# Patient Record
Sex: Male | Born: 1955 | Race: White | Hispanic: No | Marital: Married | Smoking: Former smoker
Health system: Southern US, Community
[De-identification: ages and names within clinical notes are randomized; demographics above are authoritative.]

## PROBLEM LIST (undated history)

## (undated) DIAGNOSIS — B182 Chronic viral hepatitis C: Secondary | ICD-10-CM

## (undated) DIAGNOSIS — E119 Type 2 diabetes mellitus without complications: Secondary | ICD-10-CM

## (undated) DIAGNOSIS — I499 Cardiac arrhythmia, unspecified: Secondary | ICD-10-CM

## (undated) DIAGNOSIS — F528 Other sexual dysfunction not due to a substance or known physiological condition: Secondary | ICD-10-CM

## (undated) DIAGNOSIS — Z9289 Personal history of other medical treatment: Secondary | ICD-10-CM

## (undated) DIAGNOSIS — N400 Enlarged prostate without lower urinary tract symptoms: Secondary | ICD-10-CM

## (undated) DIAGNOSIS — F909 Attention-deficit hyperactivity disorder, unspecified type: Secondary | ICD-10-CM

## (undated) DIAGNOSIS — R0600 Dyspnea, unspecified: Secondary | ICD-10-CM

## (undated) DIAGNOSIS — K219 Gastro-esophageal reflux disease without esophagitis: Secondary | ICD-10-CM

## (undated) DIAGNOSIS — D649 Anemia, unspecified: Secondary | ICD-10-CM

## (undated) HISTORY — DX: Gastro-esophageal reflux disease without esophagitis: K21.9

## (undated) HISTORY — PX: APPENDECTOMY: SHX54

## (undated) HISTORY — PX: KNEE SURGERY: SHX244

## (undated) HISTORY — DX: Benign prostatic hyperplasia without lower urinary tract symptoms: N40.0

## (undated) HISTORY — DX: Attention-deficit hyperactivity disorder, unspecified type: F90.9

## (undated) HISTORY — DX: Type 2 diabetes mellitus without complications: E11.9

## (undated) HISTORY — DX: Other sexual dysfunction not due to a substance or known physiological condition: F52.8

## (undated) HISTORY — PX: TONSILLECTOMY: SUR1361

## (undated) HISTORY — PX: OTHER SURGICAL HISTORY: SHX169

## (undated) HISTORY — DX: Chronic viral hepatitis C: B18.2

---
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
## 1979-02-15 HISTORY — PX: TRACHEOSTOMY CLOSURE: SHX458

## 2005-02-14 LAB — HM COLONOSCOPY: HM Colonoscopy: NORMAL

## 2005-05-10 ENCOUNTER — Ambulatory Visit: Payer: Self-pay | Admitting: Internal Medicine

## 2005-05-16 ENCOUNTER — Ambulatory Visit: Payer: Self-pay | Admitting: Internal Medicine

## 2005-06-27 ENCOUNTER — Ambulatory Visit: Payer: Self-pay | Admitting: Internal Medicine

## 2005-08-29 ENCOUNTER — Ambulatory Visit: Payer: Self-pay | Admitting: Internal Medicine

## 2005-09-16 ENCOUNTER — Ambulatory Visit: Payer: Self-pay | Admitting: Gastroenterology

## 2005-10-03 ENCOUNTER — Ambulatory Visit: Payer: Self-pay | Admitting: Gastroenterology

## 2005-11-29 ENCOUNTER — Ambulatory Visit: Payer: Self-pay | Admitting: Internal Medicine

## 2005-11-29 LAB — CONVERTED CEMR LAB
ALT: 152 units/L — ABNORMAL HIGH (ref 0–40)
AST: 68 units/L — ABNORMAL HIGH (ref 0–37)
Albumin: 3.9 g/dL (ref 3.5–5.2)
Alkaline Phosphatase: 53 units/L (ref 39–117)
Bilirubin, Direct: 0.2 mg/dL (ref 0.0–0.3)
Hgb A1c MFr Bld: 6.3 % — ABNORMAL HIGH (ref 4.6–6.0)
Total Bilirubin: 1 mg/dL (ref 0.3–1.2)
Total Protein: 7 g/dL (ref 6.0–8.3)

## 2005-12-27 ENCOUNTER — Ambulatory Visit: Payer: Self-pay | Admitting: Internal Medicine

## 2006-03-29 ENCOUNTER — Ambulatory Visit: Payer: Self-pay | Admitting: Internal Medicine

## 2006-07-19 ENCOUNTER — Encounter: Payer: Self-pay | Admitting: Internal Medicine

## 2006-07-19 DIAGNOSIS — F909 Attention-deficit hyperactivity disorder, unspecified type: Secondary | ICD-10-CM | POA: Insufficient documentation

## 2006-07-19 DIAGNOSIS — I779 Disorder of arteries and arterioles, unspecified: Secondary | ICD-10-CM | POA: Insufficient documentation

## 2006-07-19 DIAGNOSIS — E119 Type 2 diabetes mellitus without complications: Secondary | ICD-10-CM

## 2006-07-19 DIAGNOSIS — K219 Gastro-esophageal reflux disease without esophagitis: Secondary | ICD-10-CM | POA: Insufficient documentation

## 2006-07-19 DIAGNOSIS — F528 Other sexual dysfunction not due to a substance or known physiological condition: Secondary | ICD-10-CM | POA: Insufficient documentation

## 2006-07-19 DIAGNOSIS — N400 Enlarged prostate without lower urinary tract symptoms: Secondary | ICD-10-CM

## 2006-07-19 HISTORY — DX: Other sexual dysfunction not due to a substance or known physiological condition: F52.8

## 2006-07-19 HISTORY — DX: Type 2 diabetes mellitus without complications: E11.9

## 2006-07-19 HISTORY — DX: Benign prostatic hyperplasia without lower urinary tract symptoms: N40.0

## 2006-07-19 HISTORY — DX: Attention-deficit hyperactivity disorder, unspecified type: F90.9

## 2006-07-19 HISTORY — DX: Gastro-esophageal reflux disease without esophagitis: K21.9

## 2006-10-13 ENCOUNTER — Telehealth: Payer: Self-pay | Admitting: Family Medicine

## 2006-11-03 ENCOUNTER — Telehealth: Payer: Self-pay | Admitting: Internal Medicine

## 2007-03-09 ENCOUNTER — Telehealth: Payer: Self-pay | Admitting: Internal Medicine

## 2007-04-02 ENCOUNTER — Telehealth (INDEPENDENT_AMBULATORY_CARE_PROVIDER_SITE_OTHER): Payer: Self-pay | Admitting: *Deleted

## 2007-04-03 ENCOUNTER — Ambulatory Visit: Payer: Self-pay | Admitting: Internal Medicine

## 2007-04-03 DIAGNOSIS — B182 Chronic viral hepatitis C: Secondary | ICD-10-CM | POA: Insufficient documentation

## 2007-04-03 DIAGNOSIS — J069 Acute upper respiratory infection, unspecified: Secondary | ICD-10-CM | POA: Insufficient documentation

## 2007-04-03 HISTORY — DX: Chronic viral hepatitis C: B18.2

## 2007-04-03 LAB — CONVERTED CEMR LAB
ALT: 109 units/L — ABNORMAL HIGH (ref 0–53)
AST: 57 units/L — ABNORMAL HIGH (ref 0–37)
Albumin: 4.1 g/dL (ref 3.5–5.2)
Alkaline Phosphatase: 76 units/L (ref 39–117)
BUN: 9 mg/dL (ref 6–23)
Basophils Absolute: 0 10*3/uL (ref 0.0–0.1)
Basophils Relative: 0.3 % (ref 0.0–1.0)
Bilirubin, Direct: 0.3 mg/dL (ref 0.0–0.3)
CO2: 28 meq/L (ref 19–32)
Calcium: 9.8 mg/dL (ref 8.4–10.5)
Chloride: 100 meq/L (ref 96–112)
Creatinine, Ser: 0.9 mg/dL (ref 0.4–1.5)
Eosinophils Absolute: 0.1 10*3/uL (ref 0.0–0.6)
Eosinophils Relative: 0.8 % (ref 0.0–5.0)
GFR calc Af Amer: 114 mL/min
GFR calc non Af Amer: 95 mL/min
Glucose, Bld: 177 mg/dL — ABNORMAL HIGH (ref 70–99)
HCT: 49.6 % (ref 39.0–52.0)
Hemoglobin: 17.1 g/dL — ABNORMAL HIGH (ref 13.0–17.0)
Hgb A1c MFr Bld: 8 % — ABNORMAL HIGH (ref 4.6–6.0)
Lymphocytes Relative: 24.8 % (ref 12.0–46.0)
MCHC: 34.6 g/dL (ref 30.0–36.0)
MCV: 90.3 fL (ref 78.0–100.0)
Monocytes Absolute: 0.8 10*3/uL — ABNORMAL HIGH (ref 0.2–0.7)
Monocytes Relative: 8.5 % (ref 3.0–11.0)
Neutro Abs: 6.3 10*3/uL (ref 1.4–7.7)
Neutrophils Relative %: 65.6 % (ref 43.0–77.0)
PSA: 0.63 ng/mL (ref 0.10–4.00)
Platelets: 179 10*3/uL (ref 150–400)
Potassium: 4.3 meq/L (ref 3.5–5.1)
RBC: 5.49 M/uL (ref 4.22–5.81)
RDW: 11.6 % (ref 11.5–14.6)
Sodium: 137 meq/L (ref 135–145)
TSH: 1.32 microintl units/mL (ref 0.35–5.50)
Total Bilirubin: 1.1 mg/dL (ref 0.3–1.2)
Total Protein: 7 g/dL (ref 6.0–8.3)
WBC: 9.6 10*3/uL (ref 4.5–10.5)

## 2007-04-17 ENCOUNTER — Telehealth: Payer: Self-pay | Admitting: Internal Medicine

## 2007-05-10 ENCOUNTER — Telehealth (INDEPENDENT_AMBULATORY_CARE_PROVIDER_SITE_OTHER): Payer: Self-pay | Admitting: *Deleted

## 2007-05-30 ENCOUNTER — Telehealth: Payer: Self-pay | Admitting: Internal Medicine

## 2007-05-31 ENCOUNTER — Telehealth (INDEPENDENT_AMBULATORY_CARE_PROVIDER_SITE_OTHER): Payer: Self-pay | Admitting: *Deleted

## 2007-05-31 ENCOUNTER — Encounter: Payer: Self-pay | Admitting: Internal Medicine

## 2007-07-03 ENCOUNTER — Encounter: Payer: Self-pay | Admitting: Internal Medicine

## 2007-07-05 ENCOUNTER — Encounter: Admission: RE | Admit: 2007-07-05 | Discharge: 2007-07-05 | Payer: Self-pay | Admitting: Gastroenterology

## 2007-07-10 ENCOUNTER — Telehealth: Payer: Self-pay | Admitting: Internal Medicine

## 2007-07-20 ENCOUNTER — Encounter: Payer: Self-pay | Admitting: Internal Medicine

## 2007-07-20 ENCOUNTER — Telehealth: Payer: Self-pay | Admitting: Internal Medicine

## 2007-08-06 ENCOUNTER — Encounter: Payer: Self-pay | Admitting: Internal Medicine

## 2007-08-14 ENCOUNTER — Encounter (INDEPENDENT_AMBULATORY_CARE_PROVIDER_SITE_OTHER): Payer: Self-pay | Admitting: Diagnostic Radiology

## 2007-08-14 ENCOUNTER — Ambulatory Visit (HOSPITAL_COMMUNITY): Admission: RE | Admit: 2007-08-14 | Discharge: 2007-08-14 | Payer: Self-pay | Admitting: Gastroenterology

## 2007-09-07 ENCOUNTER — Encounter: Payer: Self-pay | Admitting: Internal Medicine

## 2007-10-12 ENCOUNTER — Telehealth: Payer: Self-pay | Admitting: Internal Medicine

## 2007-12-14 ENCOUNTER — Telehealth: Payer: Self-pay | Admitting: Internal Medicine

## 2007-12-19 ENCOUNTER — Encounter: Payer: Self-pay | Admitting: Internal Medicine

## 2007-12-19 ENCOUNTER — Telehealth: Payer: Self-pay | Admitting: Internal Medicine

## 2008-05-02 ENCOUNTER — Telehealth: Payer: Self-pay | Admitting: Internal Medicine

## 2008-06-12 ENCOUNTER — Telehealth: Payer: Self-pay | Admitting: Internal Medicine

## 2008-10-06 ENCOUNTER — Telehealth: Payer: Self-pay | Admitting: Internal Medicine

## 2008-10-13 ENCOUNTER — Ambulatory Visit: Payer: Self-pay | Admitting: Internal Medicine

## 2008-10-13 LAB — CONVERTED CEMR LAB
ALT: 172 units/L — ABNORMAL HIGH (ref 0–53)
AST: 83 units/L — ABNORMAL HIGH (ref 0–37)
Albumin: 4 g/dL (ref 3.5–5.2)
Alkaline Phosphatase: 54 units/L (ref 39–117)
BUN: 18 mg/dL (ref 6–23)
Basophils Absolute: 0 10*3/uL (ref 0.0–0.1)
Basophils Relative: 0.1 % (ref 0.0–3.0)
Bilirubin, Direct: 0.2 mg/dL (ref 0.0–0.3)
CO2: 26 meq/L (ref 19–32)
Calcium: 9.4 mg/dL (ref 8.4–10.5)
Chloride: 105 meq/L (ref 96–112)
Cholesterol: 157 mg/dL (ref 0–200)
Creatinine, Ser: 1.1 mg/dL (ref 0.4–1.5)
Creatinine,U: 403.2 mg/dL
Eosinophils Absolute: 0.1 10*3/uL (ref 0.0–0.7)
Eosinophils Relative: 1.2 % (ref 0.0–5.0)
GFR calc non Af Amer: 74.28 mL/min (ref >60)
Glucose, Bld: 264 mg/dL — ABNORMAL HIGH (ref 70–99)
HCT: 45 % (ref 39.0–52.0)
HDL: 32.1 mg/dL — ABNORMAL LOW (ref >39.00)
Hemoglobin: 16.1 g/dL (ref 13.0–17.0)
LDL Cholesterol: 98 mg/dL (ref 0–99)
Lymphocytes Relative: 29.3 % (ref 12.0–46.0)
Lymphs Abs: 1.6 10*3/uL (ref 0.7–4.0)
MCHC: 35.7 g/dL (ref 30.0–36.0)
MCV: 89.2 fL (ref 78.0–100.0)
Microalb Creat Ratio: 16.6 mg/g (ref 0.0–30.0)
Microalb, Ur: 6.7 mg/dL — ABNORMAL HIGH (ref 0.0–1.9)
Monocytes Absolute: 0.6 10*3/uL (ref 0.1–1.0)
Monocytes Relative: 9.9 % (ref 3.0–12.0)
Neutro Abs: 3.3 10*3/uL (ref 1.4–7.7)
Neutrophils Relative %: 59.5 % (ref 43.0–77.0)
PSA: 0.61 ng/mL (ref 0.10–4.00)
Platelets: 136 10*3/uL — ABNORMAL LOW (ref 150.0–400.0)
Potassium: 4 meq/L (ref 3.5–5.1)
RBC: 5.04 M/uL (ref 4.22–5.81)
RDW: 11.8 % (ref 11.5–14.6)
Sodium: 138 meq/L (ref 135–145)
TSH: 2 microintl units/mL (ref 0.35–5.50)
Total Bilirubin: 1.4 mg/dL — ABNORMAL HIGH (ref 0.3–1.2)
Total CHOL/HDL Ratio: 5
Total Protein: 7.1 g/dL (ref 6.0–8.3)
Triglycerides: 136 mg/dL (ref 0.0–149.0)
VLDL: 27.2 mg/dL (ref 0.0–40.0)
WBC: 5.6 10*3/uL (ref 4.5–10.5)

## 2008-10-23 ENCOUNTER — Ambulatory Visit: Payer: Self-pay | Admitting: Internal Medicine

## 2008-10-23 LAB — CONVERTED CEMR LAB: Hgb A1c MFr Bld: 9.1 % — ABNORMAL HIGH (ref 4.6–6.5)

## 2008-10-23 LAB — HM DIABETES EYE EXAM: HM Diabetic Eye Exam: NORMAL

## 2008-10-30 ENCOUNTER — Ambulatory Visit: Payer: Self-pay | Admitting: Endocrinology

## 2008-11-06 ENCOUNTER — Telehealth: Payer: Self-pay | Admitting: Internal Medicine

## 2008-11-12 ENCOUNTER — Encounter: Payer: Self-pay | Admitting: Internal Medicine

## 2008-11-24 ENCOUNTER — Ambulatory Visit: Payer: Self-pay | Admitting: Endocrinology

## 2009-01-02 ENCOUNTER — Encounter (INDEPENDENT_AMBULATORY_CARE_PROVIDER_SITE_OTHER): Payer: Self-pay | Admitting: *Deleted

## 2009-01-23 ENCOUNTER — Ambulatory Visit: Payer: Self-pay | Admitting: Endocrinology

## 2009-01-23 LAB — CONVERTED CEMR LAB: Hgb A1c MFr Bld: 8 % — ABNORMAL HIGH (ref 4.6–6.5)

## 2009-02-16 ENCOUNTER — Telehealth: Payer: Self-pay | Admitting: Internal Medicine

## 2009-02-18 ENCOUNTER — Telehealth: Payer: Self-pay | Admitting: Endocrinology

## 2009-04-24 ENCOUNTER — Ambulatory Visit: Payer: Self-pay | Admitting: Endocrinology

## 2009-05-22 ENCOUNTER — Ambulatory Visit: Payer: Self-pay | Admitting: Endocrinology

## 2009-05-22 LAB — CONVERTED CEMR LAB: Hgb A1c MFr Bld: 10.1 % — ABNORMAL HIGH (ref 4.6–6.5)

## 2009-05-25 ENCOUNTER — Ambulatory Visit: Payer: Self-pay | Admitting: Family Medicine

## 2009-05-25 DIAGNOSIS — S6000XA Contusion of unspecified finger without damage to nail, initial encounter: Secondary | ICD-10-CM | POA: Insufficient documentation

## 2009-05-28 ENCOUNTER — Ambulatory Visit: Payer: Self-pay | Admitting: Internal Medicine

## 2009-06-08 ENCOUNTER — Telehealth: Payer: Self-pay | Admitting: Internal Medicine

## 2009-06-16 ENCOUNTER — Telehealth: Payer: Self-pay | Admitting: Internal Medicine

## 2009-07-03 ENCOUNTER — Ambulatory Visit: Payer: Self-pay | Admitting: Endocrinology

## 2009-08-07 ENCOUNTER — Telehealth: Payer: Self-pay | Admitting: Internal Medicine

## 2009-10-20 ENCOUNTER — Ambulatory Visit: Payer: Self-pay | Admitting: Internal Medicine

## 2009-10-20 ENCOUNTER — Ambulatory Visit: Payer: Self-pay | Admitting: Endocrinology

## 2009-10-20 DIAGNOSIS — C44309 Unspecified malignant neoplasm of skin of other parts of face: Secondary | ICD-10-CM | POA: Insufficient documentation

## 2009-10-20 DIAGNOSIS — C44300 Unspecified malignant neoplasm of skin of unspecified part of face: Secondary | ICD-10-CM | POA: Insufficient documentation

## 2009-10-20 LAB — CONVERTED CEMR LAB
Blood Glucose, Fingerstick: 245
Hgb A1c MFr Bld: 9.9 % — ABNORMAL HIGH (ref 4.6–6.5)

## 2009-11-09 ENCOUNTER — Telehealth: Payer: Self-pay | Admitting: Endocrinology

## 2009-11-12 ENCOUNTER — Encounter: Payer: Self-pay | Admitting: Internal Medicine

## 2009-12-07 ENCOUNTER — Telehealth: Payer: Self-pay | Admitting: Endocrinology

## 2009-12-07 ENCOUNTER — Telehealth: Payer: Self-pay | Admitting: Internal Medicine

## 2009-12-10 ENCOUNTER — Telehealth: Payer: Self-pay | Admitting: Internal Medicine

## 2009-12-15 ENCOUNTER — Encounter: Payer: Self-pay | Admitting: Internal Medicine

## 2009-12-15 ENCOUNTER — Telehealth: Payer: Self-pay | Admitting: Internal Medicine

## 2009-12-16 ENCOUNTER — Ambulatory Visit: Payer: Self-pay | Admitting: Endocrinology

## 2009-12-17 ENCOUNTER — Encounter: Payer: Self-pay | Admitting: Internal Medicine

## 2010-01-19 ENCOUNTER — Telehealth: Payer: Self-pay | Admitting: Internal Medicine

## 2010-01-22 ENCOUNTER — Ambulatory Visit: Payer: Self-pay | Admitting: Endocrinology

## 2010-01-22 LAB — CONVERTED CEMR LAB: Hgb A1c MFr Bld: 8.5 % — ABNORMAL HIGH (ref 4.6–6.5)

## 2010-01-27 ENCOUNTER — Telehealth: Payer: Self-pay | Admitting: Endocrinology

## 2010-03-09 ENCOUNTER — Telehealth: Payer: Self-pay | Admitting: Endocrinology

## 2010-03-15 ENCOUNTER — Telehealth: Payer: Self-pay | Admitting: Internal Medicine

## 2010-03-18 NOTE — Progress Notes (Signed)
Summary:  rx question    Phone Note Call from Patient Call back at Home Phone [phone]   Caller: pt vm triage Call For: K  Summary of Call: Has a question about rx  Initial call taken by: Roselle Locus,  May 30, 2007 8:44 AM  Follow-up for Phone Call        [facility] 05/30/2007 Follow-up by: Lynann Beaver CMA,  May 30, 2007 9:21 AM  Additional Follow-up for Phone Call Additional follow up Details #1::        LMTCB ..................................................................Marland KitchenSid Falcon LPN  June 01, 2007 3:54 PM  Pt called back, requesting Rx for Free Style Freedom testing strips, #50 in a bottle, checking BS one time daily.  [facility]  Additional Follow-up by: Sid Falcon LPN,  June 01, 2007 4:43 PM    New/Updated Medications: FREESTYLE LITE   STRP (GLUCOSE BLOOD) 1 qd   Prescriptions: FREESTYLE LITE   STRP (GLUCOSE BLOOD) 1 qd  #50 x 6   Entered by:   Raechel Ache, RN   Authorized by:   Gordy Savers  MD   Signed by:   Raechel Ache, RN on 06/01/2007   Method used:   Historical   RxID:   [id number]

## 2010-03-18 NOTE — Progress Notes (Signed)
Summary: rx refill req  Phone Note Refill Request Message from:  Fax from Pharmacy on March 09, 2010 9:00 AM  Refills Requested: Medication #1:  HUMULIN N 100 UNIT/ML SUSP 30 units at bedtime   Dosage confirmed as above?Dosage Confirmed [facility] 90 day supply   Method Requested: Fax to [facility] Next Appointment Scheduled: 04/30/2010 Initial call taken by: Brenton Grills CMA (AAMA),  March 09, 2010 9:05 AM    Prescriptions: HUMULIN N 100 UNIT/ML SUSP (INSULIN ISOPHANE HUMAN) 30 units at bedtime  #3 month x 2   Entered by:   Brenton Grills CMA (AAMA)   Authorized by:   Minus Breeding MD   Signed by:   Brenton Grills CMA (AAMA) on 03/09/2010   Method used:   Faxed to ...       [facility] (mail-order)             , [place]         Ph: [phone]       Fax: [phone]   RxID:   [id number]

## 2010-03-18 NOTE — Progress Notes (Signed)
Summary: new rx  Phone Note Call from Patient Call back at [phone]   Caller: Patient Call For: dr Kirtland Bouchard Summary of Call: pt would like rx for amphetamine salt combo 20 mg please call when ready for pick up Initial call taken by: Heron Sabins,  October 12, 2007 10:17 AM      Prescriptions: AMPHETAMINE SALT COMBO 20 MG  TABS (AMPHETAMINE-DEXTROAMPHETAMINE) one two times a day to three times a day  #90 x 0   Entered and Authorized by:   Gordy Savers  MD   Signed by:   Gordy Savers  MD on 10/12/2007   Method used:   Print then Give to Patient   RxID:   [id number]  OK

## 2010-03-18 NOTE — Medication Information (Signed)
Summary: Prior Authorization request and approval for Amphetamine Salt Co  Prior Authorization request and approval for Amphetamine Salt Combo Tablet/[facility]   Imported By: Maryln Gottron 11/24/2008 11:01:55  _____________________________________________________________________  External Attachment:    Type:   Image     Comment:   External Document

## 2010-03-18 NOTE — Progress Notes (Signed)
Summary: wants relief  Phone Note Call from Patient Call back at [phone]   Caller: Patient-live Call For: dr Kirtland Bouchard Summary of Call: pt cold/ sinuses/ coughing up greenish mucus, ear pain. wants relief [facility] at [phone]. pt can not take keflex. Initial call taken by: Warnell Forester,  April 02, 2007 12:26 PM  Follow-up for Phone Call        Scheduled OV for evaluation at 11:15 am tomorrow Follow-up by: Sid Falcon LPN,  April 02, 2007 1:47 PM

## 2010-03-18 NOTE — Progress Notes (Signed)
Summary: rx refill req  Phone Note Refill Request Message from:  Fax from Pharmacy on December 07, 2009 3:37 PM  Refills Requested: Medication #1:  FREESTYLE LITE   STRP qid   Dosage confirmed as above?Dosage Confirmed   Last Refilled: 11/24/2008  Method Requested: Electronic Next Appointment Scheduled: 01/18/2010 Initial call taken by: Brenton Grills MA,  December 07, 2009 3:37 PM    Prescriptions: FREESTYLE LITE   STRP (GLUCOSE BLOOD) qid, and lancets.  variable glucoses. 250.01  #120 x 5   Entered by:   Brenton Grills MA   Authorized by:   Minus Breeding MD   Signed by:   Brenton Grills MA on 12/07/2009   Method used:   Electronically to        [facility] [address] [phone]* (retail)       [address]       [address]       [place], [place]  [address]       Ph: [phone]       Fax: [phone]   RxID:   [id number]

## 2010-03-18 NOTE — Progress Notes (Signed)
Summary: ? pa  Phone Note Call from Patient Call back at Work Phone [phone]   Caller: Patient---triage vm Summary of Call: please call [facility] about his amphetamine salts. it was denied. Initial call taken by: Warnell Forester,  December 15, 2009 8:45 AM  Follow-up for Phone Call        cindy will do an appeal letter to [facility]. Follow-up by: Warnell Forester,  December 15, 2009 11:05 AM  Additional Follow-up for Phone Call Additional follow up Details #1::        appeal letter was faxed to [facility] on yesterday afternoon. I called pt and he is aware of this process. Additional Follow-up by: Warnell Forester,  December 16, 2009 9:40 AM    Additional Follow-up for Phone Call Additional follow up Details #2::    rx was approved.   Lmoam to return my call. Follow-up by: Warnell Forester,  December 18, 2009 1:32 PM  Additional Follow-up for Phone Call Additional follow up Details #3:: Details for Additional Follow-up Action Taken: pt and pharmacy are both aware Additional Follow-up by: Warnell Forester,  December 18, 2009 3:54 PM

## 2010-03-18 NOTE — Progress Notes (Signed)
Summary: CBG  Phone Note Call from Patient Call back at Work Phone [phone]   Caller: Patient Summary of Call: pt called t inform MD that since stopping Janumet his CBG on average have been increased at 275. pt is concerned, please advise Initial call taken by: Margaret Pyle, CMA,  February 18, 2009 1:47 PM  Follow-up for Phone Call        increase novolog to 10 units three times a day (just before each meal).  same lantus.  return as scheduled. Follow-up by: Minus Breeding MD,  February 18, 2009 5:50 PM  Additional Follow-up for Phone Call Additional follow up Details #1::        left message on machine for pt to return my call and informed of MD's advisement Additional Follow-up by: Margaret Pyle, CMA,  February 19, 2009 8:07 AM    Additional Follow-up for Phone Call Additional follow up Details #2::    pt informed Follow-up by: Margaret Pyle, CMA,  February 19, 2009 8:08 AM

## 2010-03-18 NOTE — Progress Notes (Signed)
Summary:  request  cialis  Phone Note Call from Patient   Caller: Patient Summary of Call: Pt requesting rx for Cialis didn't see on med list to fill Initial call taken by: Kathrynn Speed CMA,  August 07, 2009 5:28 PM  Follow-up for Phone Call        cialis  20 mg  #6 RF^ Follow-up by: Gordy Savers  MD,  August 10, 2009 12:47 PM    New/Updated Medications: CIALIS 20 MG TABS (TADALAFIL) take as directed Prescriptions: CIALIS 20 MG TABS (TADALAFIL) take as directed  #6 x 6   Entered by:   Duard Brady LPN   Authorized by:   Gordy Savers  MD   Signed by:   Duard Brady LPN on 63/02/6008   Method used:   Historical   RxID:   [id number]  called to [facility] - called pt hm# LMTCB if problems. KIK

## 2010-03-18 NOTE — Assessment & Plan Note (Signed)
Summary: 1 MO ROV /NWS   Vital Signs:  Patient profile:   55 year old male Height:      69 inches (175.26 cm) Weight:      201.25 pounds (91.48 kg) BMI:     29.83 O2 Sat:      95 % on Room air Temp:     98.6 degrees F (37.00 degrees C) oral Pulse rate:   80 / minute BP sitting:   136 / 84  (left arm) Cuff size:   regular  Vitals Entered By: Brenton Grills CMA Duncan Dull) (December 16, 2009 8:02 AM)  O2 Flow:  Room air CC: 1 month F/U/aj Is Patient Diabetic? Yes   Referring Provider:  Dr Eleonore Chiquito Primary Provider:  Dr Eleonore Chiquito  CC:  1 month F/U/aj.  History of Present Illness: pt states he feels well in general.  no cbg record, but states cbg's are often over 200, with no trend throughout the day.  he says he cannot afford the copays of his insulins.  Current Medications (verified): 1)  Freestyle Lite   Strp (Glucose Blood) .... Qid, and Lancets.  Variable Glucoses. 250.01 2)  Lantus Solostar 100 Unit/ml Soln (Insulin Glargine) .... 22  Units At Bedtime 3)  Bd Disp Needles 25g X 5/8" Misc (Needle (Disp)) .... Qid 4)  Amphetamine-Dextroamphetamine 15 Mg Tabs (Amphetamine-Dextroamphetamine) .Marland Kitchen.. 1 Two Times A Day As Needed 5)  Humalog Kwikpen 100 Unit/ml Soln (Insulin Lispro (Human)) .... Three Times A Day (Just Before Each Meal) 16-16-13 Units 6)  Cialis 20 Mg Tabs (Tadalafil) .... Take As Directed 7)  Bd Pen Needle Ultrafine 29g X 12.27mm Misc (Insulin Pen Needle) .Marland Kitchen.. 1 Needle Four Times A Day Dx 250.00 8)  Omeprazole 40 Mg Cpdr (Omeprazole) .Marland Kitchen.. 1 Once Daily  Allergies (verified): 1)  Keflex (Cephalexin)  Past History:  Past Medical History: Last updated: 04/03/2007 Diabetes mellitus, type II GERD Benign prostatic hypertrophy ADHD ED chronic hepatitis C  Review of Systems  The patient denies hypoglycemia.    Physical Exam  General:  normal appearance.   Psych:  Alert and cooperative; normal mood and affect; normal attention span and  concentration.     Impression & Recommendations:  Problem # 1:  DIABETES MELLITUS, TYPE II (ICD-250.00) therapy limited by pt's request for least expensive meds needs increased rx  Medications Added to Medication List This Visit: 1)  Humulin R 100 Unit/ml Soln (Insulin regular human) .... Three times a day (just before each meal) 22-16-20 units 2)  Humulin N 100 Unit/ml Susp (Insulin isophane human) .... 25 units at bedtime 3)  Bd Insulin Syr Ultrafine Ii 31g X 5/16" 0.5 Ml Misc (Insulin syringe-needle u-100) .... 4x a day  Other Orders: Est. Patient Level III (16109)  Patient Instructions: 1)  check your blood sugar 3 times a day.  vary the time of day when you check, between before the 3 meals, and at bedtime.  also check if you have symptoms of your blood sugar being too high or too low.  please keep a record of the readings and bring it to your next appointment here.  please call us sooner if you are having low blood sugar episodes. 2)  change lantus to nph insulin, 25 units at night. 3)  change novolog to regular insulin three times a day (just before each meal) 22-16-20 units. 4)  Please schedule a follow-up appointment in 1 month. 5)  here is also a prescription for humalog pens, to take  at the same amount as the novolog, if you are away from home. Prescriptions: HUMULIN R 100 UNIT/ML SOLN (INSULIN REGULAR HUMAN) three times a day (just before each meal) 22-16-20 units  #6 vials x 3   Entered and Authorized by:   Minus Breeding MD   Signed by:   Minus Breeding MD on 12/16/2009   Method used:   Print then Give to Patient   RxID:   [id number] HUMULIN N 100 UNIT/ML SUSP (INSULIN ISOPHANE HUMAN) 25 units at bedtime  #3 vials x 3   Entered and Authorized by:   Minus Breeding MD   Signed by:   Minus Breeding MD on 12/16/2009   Method used:   Print then Give to Patient   RxID:   [id number] BD INSULIN SYR ULTRAFINE II 31G X 5/16" 0.5 ML MISC (INSULIN SYRINGE-NEEDLE U-100)  4x a day  #360 x 3   Entered and Authorized by:   Minus Breeding MD   Signed by:   Minus Breeding MD on 12/16/2009   Method used:   Print then Give to Patient   RxID:   [id number] HUMALOG KWIKPEN 100 UNIT/ML SOLN (INSULIN LISPRO (HUMAN)) three times a day (just before each meal) 16-16-13 units  #1 box x 11   Entered and Authorized by:   Minus Breeding MD   Signed by:   Minus Breeding MD on 12/16/2009   Method used:   Print then Give to Patient   RxID:   [id number]    Orders Added: 1)  Est. Patient Level III [09323]

## 2010-03-18 NOTE — Progress Notes (Signed)
Summary: BLOODWORK RESULTS   Phone Note Call from Patient Call back at [phone]   Caller: PATIENT LIVE Call For: k  Summary of Call: BLOODWORK RESULTS  Initial call taken by: Roselle Locus,  April 17, 2007 3:17 PM  Follow-up for Phone Call        Mild elevated liver tests;  needs to f/u GI for chronic hepatitis;  HghA1C 8.0 reflecting suboptimal diabetic control; increase metformin to 1000 mg twice daily Follow-up by: Gordy Savers  MD,  April 17, 2007 4:28 PM  Additional Follow-up for Phone Call Additional follow up Details #1::        LMTCB  Pt. given Dr. Charm Rings recommendations and will make appt with GI and increase Metforrmin. ..................................................................Marland KitchenLynann Beaver CMA  April 18, 2007 12:52 PM Additional Follow-up by: Raechel Ache, RN,  April 17, 2007 4:34 PM

## 2010-03-18 NOTE — Consult Note (Signed)
Summary: [facility]  [facility]   Imported By: Maryln Gottron 11/20/2009 14:14:42  _____________________________________________________________________  External Attachment:    Type:   Image     Comment:   External Document

## 2010-03-18 NOTE — Progress Notes (Signed)
Summary: Rxs wrong  Phone Note Call from Patient Call back at Work Phone [phone]   Caller: vm Summary of Call: Rxs amphetamine salts incorrect.  Usually get 10mg  three times a day #90 for 30 days.  These were for 10mg  but #30.  I guess I need to bring these back & get corrected ones.  Please call back.    Initial call taken by: Rudy Jew, RN,  Jun 16, 2009 2:45 PM  Follow-up for Phone Call        Rx ready for pick up. pt aware Follow-up by: Duard Brady LPN,  Jun 16, 1476 3:06 PM    New/Updated Medications: AMPHETAMINE-DEXTROAMPHETAMINE 10 MG TABS (AMPHETAMINE-DEXTROAMPHETAMINE) one tab 3 times daily as needed Prescriptions: AMPHETAMINE-DEXTROAMPHETAMINE 10 MG TABS (AMPHETAMINE-DEXTROAMPHETAMINE) one tab 3 times daily as needed  #90 x 0   Entered and Authorized by:   Gordy Savers  MD   Signed by:   Gordy Savers  MD on 06/16/2009   Method used:   Print then Give to Patient   RxID:   [id number]

## 2010-03-18 NOTE — Medication Information (Signed)
[facility]   Imported By: Kassie Mends 12/22/2007 10:32:27  _____________________________________________________________________  External Attachment:    Type:   Image     Comment:   External Document

## 2010-03-18 NOTE — Assessment & Plan Note (Signed)
Summary: 1 MTH FU  STC   Vital Signs:  Patient profile:   55 year old male Height:      69 inches (175.26 cm) Weight:      201.50 pounds (91.59 kg) BMI:     29.86 O2 Sat:      96 % on Room air Temp:     98.3 degrees F (36.83 degrees C) oral Pulse rate:   77 / minute BP sitting:   124 / 70  (left arm) Cuff size:   regular  Vitals Entered By: Brenton Grills CMA (AAMA) (January 22, 2010 8:00 AM)  O2 Flow:  Room air CC: Follow-up visit/aj Is Patient Diabetic? Yes   Referring Provider:  Dr Eleonore Chiquito Primary Provider:  Dr Eleonore Chiquito  CC:  Follow-up visit/aj.  History of Present Illness: pt states he feels well in general, except for fatigue.  no cbg record, but states cbg's are mildly low, twice a month,  in the afternoon, with exertion.  he says cbg are highest in am (high-100's--higher than at hs, despite no hs-snack).    Current Medications (verified): 1)  Freestyle Lite   Strp (Glucose Blood) .... Qid, and Lancets.  Variable Glucoses. 250.01 2)  Amphetamine-Dextroamphetamine 15 Mg Tabs (Amphetamine-Dextroamphetamine) .Marland Kitchen.. 1 Two Times A Day As Needed 3)  Cialis 20 Mg Tabs (Tadalafil) .... Take As Directed 4)  Omeprazole 40 Mg Cpdr (Omeprazole) .Marland Kitchen.. 1 Once Daily 5)  Humulin R 100 Unit/ml Soln (Insulin Regular Human) .... Three Times A Day (Just Before Each Meal) 22-16-20 Units 6)  Humulin N 100 Unit/ml Susp (Insulin Isophane Human) .... 25 Units At Bedtime 7)  Bd Insulin Syr Ultrafine Ii 31g X 5/16" 0.5 Ml Misc (Insulin Syringe-Needle U-100) .... 4x A Day  Allergies (verified): 1)  Keflex (Cephalexin)  Past History:  Past Medical History: Last updated: 04/03/2007 Diabetes mellitus, type II GERD Benign prostatic hypertrophy ADHD ED chronic hepatitis C  Social History: Reviewed history from 10/23/2008 and no changes required. 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    Physical Exam  General:  normal appearance.   Psych:  Alert and cooperative; normal mood and affect; normal attention span and concentration.   Additional Exam:  Hemoglobin A1C       [H]  8.5 %    Impression & Recommendations:  Problem # 1:  DIABETES MELLITUS, TYPE II (ICD-250.00) needs increased rx  Medications Added to Medication List This Visit: 1)  Humulin N 100 Unit/ml Susp (Insulin isophane human) .... 30 units at bedtime 2)  Humalog Kwikpen 100 Unit/ml Soln (Insulin lispro (human)) .... Three times a day (just before each meal) 22-16-20 units  Other Orders: TLB-A1C / Hgb A1C (Glycohemoglobin) (83036-A1C) Est. Patient Level III (25366)  Patient Instructions: 1)  check your blood sugar 3 times a day.  vary the time of day when you check, between before the 3 meals, and at bedtime.  also check if you have symptoms of your blood sugar being too high or too low.  please keep a record of the readings and bring it to your next appointment here.  please call us sooner if you are having low blood sugar episodes. 2)  blood tests are being ordered for you today.  please call [phone] to hear your test results. 3)  pending the test results, please: 4)  increase nph insulin to 30 units at night. 5)  continue regular insulin  three times a day (just before each meal) 22-16-20 units.  if exertion is anticipated over the next few hours, take just 1/2 of that amount. 6)  Please schedule a follow-up appointment in 3 months. 7)  (update: i left message on phone-tree:  rx as we discussed.  call next week, to tell us what time of day tit is highest) Prescriptions: HUMALOG KWIKPEN 100 UNIT/ML SOLN (INSULIN LISPRO (HUMAN)) three times a day (just before each meal) 22-16-20 units  #1 box x 1   Entered and Authorized by:   Minus Breeding MD   Signed by:   Minus Breeding MD on 01/22/2010   Method used:   Print then Give to Patient   RxID:   [id number]    Orders Added: 1)   TLB-A1C / Hgb A1C (Glycohemoglobin) [83036-A1C] 2)  Est. Patient Level III [14782]   Immunization History:  Influenza Immunization History:    Influenza:  historical (12/15/2009)   Immunization History:  Influenza Immunization History:    Influenza:  Historical (12/15/2009)   Preventive Care Screening  Last Flu Shot:    Date:  12/15/2009    Results:  Historical   Colonoscopy:    Date:  02/14/2005    Results:  normal     Preventive Care Screening  Last Flu Shot:    Date:  12/15/2009    Results:  Historical   Colonoscopy:    Date:  02/14/2005    Results:  normal

## 2010-03-18 NOTE — Consult Note (Signed)
Summary: Dr Talmadge Coventry  note   Dr Talmadge Coventry  note   Imported By: Kassie Mends 07/30/2007 09:04:21  _____________________________________________________________________  External Attachment:    Type:   Image     Comment:   Dr Talmadge Coventry  note

## 2010-03-18 NOTE — Progress Notes (Signed)
Summary: cheaper med  Phone Note Call from Patient Call back at [phone]   Caller: Patient Call For: dr Kirtland Bouchard Summary of Call: pt would like rx for something cheaper than adderall xr 20mg . pt no longer has ins.  Initial call taken by: Heron Sabins,  March 09, 2007 2:18 PM  Follow-up for Phone Call        OK to try generic adderal two or three  times daily; will need to pick uo new RX  in thenext week  Additional Follow-up for Phone Call Additional follow up Details #1::        Left message for patient to pickup rxs next week. Additional Follow-up by: Rudy Jew, RN,  March 09, 2007 5:29 PM    New/Updated Medications: AMPHETAMINE SALT COMBO 20 MG  TABS (AMPHETAMINE-DEXTROAMPHETAMINE) one two times a day to three times a day AMPHETAMINE SALT COMBO 10 MG  TABS (AMPHETAMINE-DEXTROAMPHETAMINE) one every 8 hours as needed   Prescriptions: AMPHETAMINE SALT COMBO 10 MG  TABS (AMPHETAMINE-DEXTROAMPHETAMINE) one every 8 hours as needed  #90 x 0   Entered and Authorized by:   Gordy Savers  MD   Signed by:   Gordy Savers  MD on 03/12/2007   Method used:   Print then Give to Patient   RxID:   [id number] AMPHETAMINE SALT COMBO 20 MG  TABS (AMPHETAMINE-DEXTROAMPHETAMINE) one two times a day to three times a day  #90 x 0   Entered by:   Rudy Jew, RN   Authorized by:   Gordy Savers  MD   Signed by:   Rudy Jew, RN on 03/09/2007   Method used:   Print then Give to Patient   RxID:   [id number] AMPHETAMINE SALT COMBO 20 MG  TABS (AMPHETAMINE-DEXTROAMPHETAMINE) one two times a day to three times a day  #90 x 0   Entered by:   Rudy Jew, RN   Authorized by:   Gordy Savers  MD   Signed by:   Rudy Jew, RN on 03/09/2007   Method used:   Print then Give to Patient   RxID:   [id number]    Noted

## 2010-03-18 NOTE — Progress Notes (Signed)
Summary: clarification on rx -[facility] [address]  Phone Note Call from Patient Call back at [phone]   Caller: pt vm triage Call For: K  Summary of Call: dropped off his rx at [facility] on [address]  They say they need clarification and have not heard from our office.  Please call so he can get his meds  Initial call taken by: Roselle Locus,  July 20, 2007 12:30 PM  Follow-up for Phone Call        Left message for pt to call back. I need the number to where he dropped of his rx. There is not a [facility] on [address].  I called [facility] and they didn't have the rx or pt in their system.  Follow-up by: Romualdo Bolk, CMA,  July 20, 2007 12:48 PM  Additional Follow-up for Phone Call Additional follow up Details #1::        [facility] . [facility] [address] . CALL [phone]. Additional Follow-up by: Warnell Forester,  July 20, 2007 2:24 PM    Additional Follow-up for Phone Call Additional follow up Details #2::    Pharmacy is wanting to know why he is taking both Adderall 10mg  1 q 8 hours and adderall 20mg  two times a day? Romualdo Bolk, CMA  July 20, 2007 2:28 PM

## 2010-03-18 NOTE — Assessment & Plan Note (Signed)
Summary: MEDICATION CONCERNS // RS   Vital Signs:  Patient profile:   55 year old male Weight:      200 pounds Temp:     98.0 degrees F oral BP sitting:   130 / 82  (right arm) Cuff size:   regular  Vitals Entered By: Duard Brady LPN (October 20, 2009 10:31 AM) CC: medication review         bs running in 200's Is Patient Diabetic? Yes Did you bring your meter with you today? No CBG Result 245   Primary Care Provider:  Dr Eleonore Chiquito  CC:  medication review         bs running in 200's.  History of Present Illness: 55 year old patient who is seen today for follow-up.  He is on basal and mealtime insulin and is scheduled for intercurrent follow-up later today.  Blood sugars have been poorly controlled.  His last hemoglobin A1c in excess of 10.  He has a history of ADD, which has been stable on his present regimen.  He has gastroesophageal reflux disease.  For the past 6 monthshe has  noted a lesion involving the bridge of his nose.  Allergies: 1)  Keflex (Cephalexin)  Past History:  Past Medical History: Reviewed history from 04/03/2007 and no changes required. Diabetes mellitus, type II GERD Benign prostatic hypertrophy ADHD ED chronic hepatitis C  Review of Systems       The patient complains of suspicious skin lesions.  The patient denies anorexia, fever, weight loss, weight gain, vision loss, decreased hearing, hoarseness, chest pain, syncope, dyspnea on exertion, peripheral edema, prolonged cough, headaches, hemoptysis, abdominal pain, melena, severe indigestion/heartburn, hematuria, incontinence, genital sores, muscle weakness, transient blindness, difficulty walking, depression, unusual weight change, abnormal bleeding, enlarged lymph nodes, angioedema, breast masses, and testicular masses.    Physical Exam  General:  Well-developed,well-nourished,in no acute distress; alert,appropriate and cooperative throughout examination Head:  Normocephalic and  atraumatic without obvious abnormalities. No apparent alopecia or balding. Eyes:  No corneal or conjunctival inflammation noted. EOMI. Perrla. Funduscopic exam benign, without hemorrhages, exudates or papilledema. Vision grossly normal. Mouth:  Oral mucosa and oropharynx without lesions or exudates.  Teeth in good repair. Neck:  No deformities, masses, or tenderness noted. Lungs:  Normal respiratory effort, chest expands symmetrically. Lungs are clear to auscultation, no crackles or wheezes. Heart:  Normal rate and regular rhythm. S1 and S2 normal without gallop, murmur, click, rub or other extra sounds. Abdomen:  Bowel sounds positive,abdomen soft and non-tender without masses, organomegaly or hernias noted. Skin:  small two to 3-mm crusted lesion involving the bridge of his nose consistent with a BCE Cervical Nodes:  No lymphadenopathy noted   Impression & Recommendations:  Problem # 1:  NEOPLASM, MALIGNANT, CARCINOMA, BASAL CELL, NOSE (ICD-173.3)  Orders: Dermatology Referral (Derma)  Problem # 2:  ATTENTION DEFICIT HYPERACTIVITY DISORDER (ICD-314.01)  Problem # 3:  DIABETES MELLITUS, TYPE II (ICD-250.00)  His updated medication list for this problem includes:    Lantus Solostar 100 Unit/ml Soln (Insulin glargine) .Marland Kitchen... 20  units at bedtime    Humalog Kwikpen 100 Unit/ml Soln (Insulin lispro (human)) .Marland Kitchen... Three times a day (just before each meal) 15-15-12 units  Orders: Capillary Blood Glucose/CBG (16[address]) per endocrine  His updated medication list for this problem includes:    Lantus Solostar 100 Unit/ml Soln (Insulin glargine) .Marland Kitchen... 20  units at bedtime    Humalog Kwikpen 100 Unit/ml Soln (Insulin lispro (human)) .Marland Kitchen... Three times a day (just  before each meal) 15-15-12 units  Problem # 4:  GERD (ICD-530.81)  Complete Medication List: 1)  Freestyle Lite Strp (Glucose blood) .... Qid, and lancets.  variable glucoses. 250.01 2)  Lantus Solostar 100 Unit/ml Soln (Insulin  glargine) .... 20  units at bedtime 3)  Bd Disp Needles 25g X 5/8" Misc (Needle (disp)) .... Qid 4)  Amphetamine-dextroamphetamine 10 Mg Tabs (Amphetamine-dextroamphetamine) .... One tab 3 times daily as needed 5)  Humalog Kwikpen 100 Unit/ml Soln (Insulin lispro (human)) .... Three times a day (just before each meal) 15-15-12 units 6)  Cialis 20 Mg Tabs (Tadalafil) .... Take as directed  Patient Instructions: 1)  Please schedule a follow-up appointment in 3 months for annual examination 2)  Limit your Sodium (Salt). 3)  It is important that you exercise regularly at least 20 minutes 5 times a week. If you develop chest pain, have severe difficulty breathing, or feel very tired , stop exercising immediately and seek medical attention. 4)  Check your blood sugars regularly. If your readings are usually above : or below 70 you should contact our office. 5)  It is important that your Diabetic A1c level is checked every 3 months. 6)  See your eye doctor yearly to check for diabetic eye damage. Prescriptions: AMPHETAMINE-DEXTROAMPHETAMINE 10 MG TABS (AMPHETAMINE-DEXTROAMPHETAMINE) one tab 3 times daily as needed  #90 x 0   Entered and Authorized by:   Gordy Savers  MD   Signed by:   Gordy Savers  MD on 10/20/2009   Method used:   Print then Give to Patient   RxID:   [id number]

## 2010-03-18 NOTE — Progress Notes (Signed)
Summary: faxed office note and blood work to Dr Deirdre Pippins office   Phone Note From Other Clinic   Caller: Andrey Campanile Dr [facility]ird[facility] Call For: K  Summary of Call: faxed office note and bloodwork to [facility] at Dr Deirdre Pippins office [phone] Initial call taken by: Roselle Locus,  May 31, 2007 9:10 AM

## 2010-03-18 NOTE — Progress Notes (Signed)
Summary: new rx  Phone Note Refill Request   Refills Requested: Medication #1:  CIALIS 20 MG TABS take as directed Pt  needs CIALIS 20MG #18  RX FAX TO [facility] [phone] WITH REFILLS   Initial call taken by: Heron Sabins,  December 10, 2009 11:07 AM    Prescriptions: CIALIS 20 MG TABS (TADALAFIL) take as directed  #18 x 1   Entered by:   Alfred Levins, CMA   Authorized by:   Gordy Savers  MD   Signed by:   Alfred Levins, CMA on 12/10/2009   Method used:   Faxed to ...       [facility] (mail-order)             , [place]         Ph: [phone]       Fax: [phone]   RxID:   [id number]  #20  RF 6

## 2010-03-18 NOTE — Consult Note (Signed)
Summary: Dr Dagoberto Ligas note  Dr Dagoberto Ligas note   Imported By: Kassie Mends 06/01/2007 14:52:50  _____________________________________________________________________  External Attachment:    Type:   Image     Comment:   Dr Dagoberto Ligas note

## 2010-03-18 NOTE — Assessment & Plan Note (Signed)
Summary: PER PT 3 MTH FU   STC   Vital Signs:  Patient profile:   55 year old male Height:      69 inches (175.26 cm) Weight:      196.13 pounds (89.15 kg) O2 Sat:      97 % on Room air Temp:     96.3 degrees F (35.72 degrees C) oral Pulse rate:   88 / minute BP sitting:   128 / 84  (right arm) Cuff size:   regular  Vitals Entered By: Sydell Axon (April 24, 2009 8:07 AM)  O2 Flow:  Room air CC: 3 month F/U, pt states he is no longer taking Amphetamine Salt Combo, and his Novolog has increased to 10 units tid   Referring Provider:  Dr Eleonore Chiquito Primary Provider:  Dr Eleonore Chiquito  CC:  3 month F/U, pt states he is no longer taking Amphetamine Salt Combo, and and his Novolog has increased to 10 units tid.  History of Present Illness: despite increasing the novolog to 10 units three times a day (just before each meal), cbg's are 200's, with no trend throughout the day.  he says nocturia is causing insomnia.  Current Medications (verified): 1)  Amphetamine Salt Combo 10 Mg  Tabs (Amphetamine-Dextroamphetamine) .... One Every 8 Hours As Needed 2)  Freestyle Lite   Strp (Glucose Blood) .... Qid, and Lancets.  Variable Glucoses. 250.01 3)  Lantus Solostar 100 Unit/ml Soln (Insulin Glargine) .... 6 Units At Bedtime 4)  Bd Disp Needles 25g X 5/8" Misc (Needle (Disp)) .... Qid 5)  Amphetamine-Dextroamphetamine 20 Mg Xr24h-Cap (Amphetamine-Dextroamphetamine) .... One Daily Every Am 6)  Novolog Flexpen 100 Unit/ml Soln (Insulin Aspart) .... 5 Units Three Times A Day (Just Before Each Meal)  Allergies (verified): 1)  Keflex (Cephalexin)  Past History:  Past Medical History: Last updated: 04/03/2007 Diabetes mellitus, type II GERD Benign prostatic hypertrophy ADHD ED chronic hepatitis C  Review of Systems  The patient denies hypoglycemia.    Physical Exam  General:  normal appearance.   Pulses:  dorsalis pedis intact bilat.   Extremities:  no  deformity.  no ulcer on the feet.  feet are of normal color and temp.  no edema  Neurologic:  cn 2-12 grossly intact.   readily moves all 4's.   sensation is intact to touch on the feet    Impression & Recommendations:  Problem # 1:  DIABETES MELLITUS, TYPE II (ICD-250.00) needs increased rx  Medications Added to Medication List This Visit: 1)  Lantus Solostar 100 Unit/ml Soln (Insulin glargine) .Marland Kitchen.. 12 units at bedtime 2)  Humalog Kwikpen 100 Unit/ml Soln (Insulin lispro (human)) .Marland Kitchen.. 11 units three times a day (just before each meal)  Other Orders: Est. Patient Level III (11914)  Patient Instructions: 1)  check your blood sugar 3 times a day.  vary the time of day when you check, between before the 3 meals, and at bedtime.  also check if you have symptoms of your blood sugar being too high or too low.  please keep a record of the readings and bring it to your next appointment here.  please call us sooner if you are having low blood sugar episodes. 2)  increase lantus to 12 units at night. 3)  increase novolog to 11 units three times a day (just before each meal) 4)  Please schedule a follow-up appointment in 1 month. Prescriptions: HUMALOG KWIKPEN 100 UNIT/ML SOLN (INSULIN LISPRO (HUMAN)) 11 units three times a day (  just before each meal)  #1 box x 11   Entered and Authorized by:   Minus Breeding MD   Signed by:   Minus Breeding MD on 04/24/2009   Method used:   Electronically to        [facility] [address] [phone]* (retail)       [address]       [address]       [place], [place]  [address]       Ph: [phone]       Fax: [phone]   RxID:   [id number]

## 2010-03-18 NOTE — Progress Notes (Signed)
Summary: Adderal rx's ready for pick up   Phone Note Call from Patient   Caller:   Pt   Call was 04/22.2011 Call For: Michael Savers  MD Summary of Call: [phone] Pt needs to pick up a written prescription for Adderal.  Please call when ready. Initial call taken by: Lynann Beaver CMA,  June 08, 2009 8:18 AM  Follow-up for Phone Call        attempt to call - ans mach - LMTCB if questions - rx's ready for pick up . KIK Follow-up by: Duard Brady LPN,  June 08, 2009 9:00 AM    Prescriptions: AMPHETAMINE-DEXTROAMPHETAMINE 10 MG TABS (AMPHETAMINE-DEXTROAMPHETAMINE) one tab daily as needed  #30 x 0   Entered and Authorized by:   Michael Savers  MD   Signed by:   Michael Savers  MD on 06/08/2009   Method used:   Print then Give to Patient   RxID:   [id number]

## 2010-03-18 NOTE — Assessment & Plan Note (Signed)
Summary: f/u appt/cd   Vital Signs:  Patient profile:   55 year old male Height:      69 inches (175.26 cm) Weight:      199.13 pounds (90.51 kg) BMI:     29.51 O2 Sat:      94 % on Room air Temp:     98.6 degrees F (37.00 degrees C) oral Pulse rate:   106 / minute BP sitting:   136 / 80  (left arm) Cuff size:   regular  Vitals Entered By: Brenton Grills MA (October 20, 2009 1:40 PM)  O2 Flow:  Room air CC: Follow-up visit/aj Is Patient Diabetic? Yes   Referring Provider:  Dr Eleonore Chiquito Primary Provider:  Dr Eleonore Chiquito  CC:  Follow-up visit/aj.  History of Present Illness: pt states he feels well in general.  no cbg record, but states cbg's are "high," at any time of day (190-250).  he seldom has hypoglycemia, and these episodes are mild--they usually happen on saturday mid-am.    Current Medications (verified): 1)  Freestyle Lite   Strp (Glucose Blood) .... Qid, and Lancets.  Variable Glucoses. 250.01 2)  Lantus Solostar 100 Unit/ml Soln (Insulin Glargine) .... 20  Units At Bedtime 3)  Bd Disp Needles 25g X 5/8" Misc (Needle (Disp)) .... Qid 4)  Amphetamine-Dextroamphetamine 10 Mg Tabs (Amphetamine-Dextroamphetamine) .... One Tab 3 Times Daily As Needed 5)  Humalog Kwikpen 100 Unit/ml Soln (Insulin Lispro (Human)) .... Three Times A Day (Just Before Each Meal) 15-15-12 Units 6)  Cialis 20 Mg Tabs (Tadalafil) .... Take As Directed  Allergies (verified): 1)  Keflex (Cephalexin)  Past History:  Past Medical History: Last updated: 04/03/2007 Diabetes mellitus, type II GERD Benign prostatic hypertrophy ADHD ED chronic hepatitis C  Review of Systems       The patient complains of weight gain.  The patient denies syncope.    Physical Exam  General:  normal appearance.   Pulses:  dorsalis pedis intact bilat.   Extremities:  no deformity.  no ulcer on the feet.  feet are of normal color and temp.  no edema there is a bandage on the right great  toenail (had contusion treated at [facility])  Neurologic:  sensation is intact to touch on the feet  Additional Exam:   Hemoglobin A1C       [H]  9.9 %    Impression & Recommendations:  Problem # 1:  DIABETES MELLITUS, TYPE II (ICD-250.00) needs increased rx  Medications Added to Medication List This Visit: 1)  Lantus Solostar 100 Unit/ml Soln (Insulin glargine) .... 22  units at bedtime 2)  Humalog Kwikpen 100 Unit/ml Soln (Insulin lispro (human)) .... Three times a day (just before each meal) 16-16-13 units  Other Orders: TLB-A1C / Hgb A1C (Glycohemoglobin) (83036-A1C) Est. Patient Level III (16109)  Patient Instructions: 1)  check your blood sugar 3 times a day.  vary the time of day when you check, between before the 3 meals, and at bedtime.  also check if you have symptoms of your blood sugar being too high or too low.  please keep a record of the readings and bring it to your next appointment here.  please call us sooner if you are having low blood sugar episodes. 2)  tests are being ordered for you today.  please call [phone] to hear your test results. 3)  pending the test results, please: increase lantus to 22 units at night. 4)  also, increase novolog to  three times a day (just before each meal) 16-16-13 units.  however, on saturday breakfast, take only 10 units of humalog.   5)  Please schedule a follow-up appointment in 3 months. 6)  (update: i left message on phone-tree:  rx as we discussed, except please ret 1 month).

## 2010-03-18 NOTE — Progress Notes (Signed)
Summary: Rx refill req  Phone Note Refill Request Message from:  Patient on November 09, 2009 11:28 AM  Refills Requested: Medication #1:  LANTUS SOLOSTAR 100 UNIT/ML SOLN 22  units at bedtime   Dosage confirmed as above?Dosage Confirmed   Supply Requested: 1 year  Method Requested: Electronic Initial call taken by: Margaret Pyle, CMA,  November 09, 2009 11:29 AM    Prescriptions: LANTUS SOLOSTAR 100 UNIT/ML SOLN (INSULIN GLARGINE) 22  units at bedtime  #66mth x 11   Entered by:   Margaret Pyle, CMA   Authorized by:   Minus Breeding MD   Signed by:   Margaret Pyle, CMA on 11/09/2009   Method used:   Electronically to        [facility] [address] [phone]* (retail)       [address]       [address]       [place], [place]  [address]       Ph: [phone]       Fax: [phone]   RxID:   [id number]

## 2010-03-18 NOTE — Assessment & Plan Note (Signed)
Summary: cpx/cjr   Vital Signs:  Patient profile:   55 year old male Height:      67.5 inches Weight:      191 pounds Pulse rate:   92 / minute Pulse rhythm:   irregular BP sitting:   120 / 62  (left arm) Cuff size:   regular  Vitals Entered By: Raechel Ache, RN (October 23, 2008 1:19 PM) CC: CPX, labs done. BS's over 200. Is Patient Diabetic? Yes   CC:  CPX and labs done. BS's over 200.Marland Kitchen  History of Present Illness: 55 year old patient who is in today for an annual physical.    He is followed locally and the [facility] for chronic hepatitis C he has a history of type 2 diabetes and has been followed intermittently by endocrinology.  He states his blood sugars are always greater than 200. He has had no recent evaluations here.  He has ADD.  He requests referral to endocrinology. He states that his ADD is poorly controlled and wishes to consider long-acting medications.  This was discussed and medications adjusted. He has been followed locally and also at [facility] for his chronic hepatitis C.  He is contemplating treatment in the future.  Preventive Screening-Counseling & Management  Alcohol-Tobacco     Smoking Status: quit  Caffeine-Diet-Exercise     Does Patient Exercise: yes  Problems Prior to Update: 1)  Routine General Medical Exam@[facility]  (ICD-V70.0) 2)  Hepatitis C, Chronic  (ICD-070.54) 3)  Uri  (ICD-465.9) 4)  Attention Deficit Hyperactivity Disorder  (ICD-314.01) 5)  Erectile Dysfunction  (ICD-302.72) 6)  Benign Prostatic Hypertrophy  (ICD-600.00) 7)  Gerd  (ICD-530.81) 8)  Diabetes Mellitus, Type II  (ICD-250.00)  Medications Prior to Update: 1)  Amaryl 4 Mg Tabs (Glimepiride) .... Take 1 Tablet By Mouth Every Morning 2)  Metformin Hcl 500 Mg Tb24 (Metformin Hcl) .... Take 2 Tablet By Mouth Every Night 3)  Amphetamine Salt Combo 20 Mg  Tabs (Amphetamine-Dextroamphetamine) .... One Two Times A Day To Three Times A Day  4)  Amphetamine Salt Combo 10 Mg  Tabs (Amphetamine-Dextroamphetamine) .... One Every 8 Hours As Needed 5)  Chantix 1 Mg  Tabs (Varenicline Tartrate) .... One Twice Daily 6)  Chantix Starting Month Pak 0.5 Mg X 11 & 1 Mg X 42  Misc (Varenicline Tartrate) 7)  Freestyle Lite   Strp (Glucose Blood) .Marland Kitchen.. 1 Qd  Allergies: 1)  Keflex (Cephalexin)  Past History:  Past Medical History: Reviewed history from 04/03/2007 and no changes required. Diabetes mellitus, type II GERD Benign prostatic hypertrophy ADHD ED chronic hepatitis C  Past Surgical History: Appendectomy Tonsillectomy R carotid injury w/trach colonoscopy 2008  Family History: Reviewed history from 04/03/2007 and no changes required. father died age 89, MI mother died age 44, palpitations of COPD no siblings  Family history positive for diabetes mellitus in two maternal aunts affected paternal grandmother had colon cancer  Social History: Reviewed history and no changes required. 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    Physical Exam  General:  mildly overweight;  normal blood pressure Head:  Normocephalic and atraumatic without obvious abnormalities. No apparent alopecia or balding.  Eyes:  No corneal or conjunctival inflammation noted. EOMI. Perrla. Funduscopic exam benign, without hemorrhages, exudates or papilledema. Vision grossly normal. Ears:  External ear exam shows no significant lesions or deformities.  Otoscopic examination reveals clear canals, tympanic membranes are intact bilaterally without bulging, retraction, inflammation or discharge. Hearing is grossly normal bilaterally. Nose:  External nasal examination shows no deformity or inflammation. Nasal mucosa are pink and moist without lesions or exudates. Mouth:  Oral mucosa and oropharynx without lesions or exudates.  Teeth in good repair. Neck:  No deformities, masses, or tenderness noted. surgical scar, right neck area, and trach scar Chest Wall:  No deformities, masses, tenderness or gynecomastia noted. Breasts:  No masses or gynecomastia noted Lungs:  Normal respiratory effort, chest expands symmetrically. Lungs are clear to auscultation, no crackles or wheezes. Heart:  Normal rate and regular rhythm. S1 and S2 normal without gallop, murmur, click, rub or other extra sounds. Abdomen:  Bowel sounds positive,abdomen soft and non-tender without masses, organomegaly or hernias noted. Rectal:  No external abnormalities noted. Normal sphincter tone. No rectal masses or tenderness. Genitalia:  Testes bilaterally descended without nodularity, tenderness or masses. No scrotal masses or lesions. No penis lesions or urethral discharge. Prostate:  Prostate gland firm and smooth, no enlargement, nodularity, tenderness, mass, asymmetry or induration.2+ enlarged.  2+ enlarged.   Msk:  No deformity or scoliosis noted of thoracic or lumbar spine.   Pulses:  R and L carotid,radial,femoral,dorsalis pedis and posterior tibial pulses are full and equal bilaterally Extremities:  No clubbing, cyanosis, edema, or deformity noted with normal full range of motion of all joints.    Neurologic:  alert & oriented X3, cranial nerves II-XII intact, strength normal in all extremities, sensation intact to light touch, sensation intact to pinprick, and gait normal.  alert & oriented X3, cranial nerves II-XII intact, strength normal in all extremities, sensation intact to light touch, sensation intact to pinprick, and gait normal.    Diabetes Management Exam:    Eye Exam:       Eye Exam done here today          Results: normal   Impression & Recommendations:  Problem # 1:  DIABETES MELLITUS, TYPE II (ICD-250.00)  The following medications were removed from the medication list:    Amaryl 4 Mg Tabs (Glimepiride) .Marland Kitchen... Take 1 tablet by mouth every morning    Metformin Hcl 500 Mg Tb24 (Metformin hcl) .Marland Kitchen... Take 2 tablet by mouth every night His updated medication list for this problem includes:    Janumet 50-1000 Mg Tabs (Sitagliptin-metformin hcl) .Marland Kitchen... 1 two times a day    Lantus Solostar 100 Unit/ml Soln (Insulin glargine) .Marland Kitchen... 12 units at bedtime  The following medications were removed from the medication list:    Amaryl 4 Mg Tabs (Glimepiride) .Marland Kitchen... Take 1 tablet by mouth every morning    Metformin Hcl 500 Mg Tb24 (Metformin hcl) .Marland Kitchen... Take 2 tablet by mouth every night His updated medication list for this problem includes:    Janumet 50-1000 Mg Tabs (Sitagliptin-metformin hcl) .Marland Kitchen... 1 two times a day    Lantus Solostar 100 Unit/ml Soln (Insulin glargine) .Marland Kitchen... 12 units at bedtime  requests referral to an endocrinologist-Will start insulin therapy and refer; consider discontinuation janu met and substituting metformin only  Orders: Venipuncture ([address]) Endocrinology Referral (Endocrine) TLB-A1C / Hgb A1C (Glycohemoglobin) (83036-A1C)  Problem # 2:  HEPATITIS  C, CHRONIC (ICD-070.54) follow-up [facility] and locally as scheduled  Problem # 3:  ERECTILE DYSFUNCTION (ICD-302.72)  Problem # 4:  BENIGN PROSTATIC HYPERTROPHY (ICD-600.00)   Problem # 5:  ROUTINE GENERAL MEDICAL EXAM@HEALTH  CARE FACL (ICD-V70.0)  Complete Medication List: 1)  Amphetamine Salt Combo 10 Mg Tabs (Amphetamine-dextroamphetamine) .... One every 8 hours as needed 2)  Freestyle Lite Strp (Glucose blood) .Marland Kitchen.. 1 qd 3)  Janumet 50-1000 Mg Tabs (Sitagliptin-metformin hcl) .Marland Kitchen.. 1 two times a day 4)  Lantus Solostar 100 Unit/ml Soln (Insulin glargine) .Marland Kitchen.. 12 units at bedtime 5)  Bd Disp Needles 25g X 5/8" Misc (Needle (disp)) .... As directed 6)  Amphetamine-dextroamphetamine 20 Mg Xr24h-cap (Amphetamine-dextroamphetamine) .... One daily every am  Patient Instructions: 1)  Please schedule a follow-up appointment in 3 months. 2)  It is important that you exercise regularly at least 20 minutes 5 times a week. If you develop chest pain, have severe difficulty breathing, or feel very tired , stop exercising immediately and seek medical attention. 3)  Check your blood sugars regularly. If your readings are usually above : or below 70 you should contact our office. 4)  See your eye doctor yearly to check for diabetic eye damage. 5)  Check your feet each night for sore areas, calluses or signs of infection. 6)  Titrate Lantus 4 units every week until FBS<150 Prescriptions: AMPHETAMINE-DEXTROAMPHETAMINE 20 MG XR24H-CAP (AMPHETAMINE-DEXTROAMPHETAMINE) one daily every am  #30 x 0   Entered and Authorized by:   Gordy Savers  MD   Signed by:   Gordy Savers  MD on 10/23/2008   Method used:   Print then Give to Patient   RxID:   [id number] AMPHETAMINE-DEXTROAMPHETAMINE 20 MG XR24H-CAP (AMPHETAMINE-DEXTROAMPHETAMINE) one daily every am  #30 x 0   Entered and Authorized by:   Gordy Savers  MD   Signed by:   Gordy Savers  MD on 10/23/2008   Method used:   Print then Give to Patient   RxID:   [id number] AMPHETAMINE-DEXTROAMPHETAMINE 20 MG XR24H-CAP (AMPHETAMINE-DEXTROAMPHETAMINE) one daily every am  #30 x 0    Entered and Authorized by:   Gordy Savers  MD   Signed by:   Gordy Savers  MD on 10/23/2008   Method used:   Print then Give to Patient   RxID:   [id number] BD DISP NEEDLES 25G X 5/8" MISC (NEEDLE (DISP)) as directed  #90 x 5   Entered and Authorized by:   Gordy Savers  MD   Signed by:   Gordy Savers  MD on 10/23/2008   Method used:   Print then Give to Patient   RxID:   [id number] LANTUS SOLOSTAR 100 UNIT/ML SOLN (INSULIN GLARGINE) 12 units at bedtime  #5 x 5   Entered and Authorized by:   Gordy Savers  MD   Signed by:   Gordy Savers  MD on 10/23/2008   Method used:   Print then Give to Patient   RxID:   [id number] JANUMET 50-1000 MG TABS (SITAGLIPTIN-METFORMIN HCL) 1 two times a day  #180 x 6   Entered and Authorized by:   Gordy Savers  MD   Signed by:   Gordy Savers  MD on 10/23/2008   Method used:   Print then Give to Patient   RxID:   [id number] FREESTYLE LITE   STRP (GLUCOSE BLOOD) 1 qd  #50 x 6   Entered and Authorized by:   Theron Arista  Lysle Dingwall  MD   Signed by:   Gordy Savers  MD on 10/23/2008   Method used:   Print then Give to Patient   RxID:   [id number] AMPHETAMINE SALT COMBO 10 MG  TABS (AMPHETAMINE-DEXTROAMPHETAMINE) one every 8 hours as needed  #90 x 0   Entered and Authorized by:   Gordy Savers  MD   Signed by:   Gordy Savers  MD on 10/23/2008   Method used:   Print then Give to Patient   RxID:   [id number] AMPHETAMINE SALT COMBO 20 MG  TABS (AMPHETAMINE-DEXTROAMPHETAMINE) one two times a day to three times a day  #60 x 0   Entered and Authorized by:   Gordy Savers  MD   Signed by:   Gordy Savers  MD on 10/23/2008   Method used:   Print then Give to Patient   RxID:   [id number]

## 2010-03-18 NOTE — Letter (Signed)
Summary: Generic Letter  [place] [facility]  [address] [place], [place] [address]   Phone: [phone]  Fax: [phone]    12/15/2009  [facility] [address] [place], [place] [address]  To Whom it may concern,  There was an error on the Prior Authorization paperwork.  After a second review of the chart,the answer to step 1 is actually yes.  Please find attached an updated request and please reconsider your decision.  Thank you for your time.  Sincerely,   Alfred Levins, CMA/Keishon Chavarin Amador Cunas, MD

## 2010-03-18 NOTE — Assessment & Plan Note (Signed)
Summary: 2 MTH FU  STC   Vital Signs:  Patient profile:   55 year old male Height:      67.5 inches (171.45 cm) Weight:      192.50 pounds (87.50 kg) O2 Sat:      94 % on Room air Temp:     97.1 degrees F (36.17 degrees C) oral Pulse rate:   91 / minute BP sitting:   144 / 72  (left arm) Cuff size:   large  Vitals Entered By: Josph Macho CMA (January 23, 2009 8:06 AM)  O2 Flow:  Room air CC: 2 month follow up/ CF Is Patient Diabetic? Yes   Referring Provider:  Dr Eleonore Chiquito Primary Provider:  Dr Eleonore Chiquito  CC:  2 month follow up/ CF.  History of Present Illness: no cbg record, but states cbg's are "160 at all times of day."  pt states he feels well in general.   Current Medications (verified): 1)  Amphetamine Salt Combo 10 Mg  Tabs (Amphetamine-Dextroamphetamine) .... One Every 8 Hours As Needed 2)  Freestyle Lite   Strp (Glucose Blood) .... Qid, and Lancets.  Variable Glucoses. 250.01 3)  Janumet 50-1000 Mg Tabs (Sitagliptin-Metformin Hcl) .Marland Kitchen.. 1 Two Times A Day 4)  Lantus Solostar 100 Unit/ml Soln (Insulin Glargine) .... 6 Units At Bedtime 5)  Bd Disp Needles 25g X 5/8" Misc (Needle (Disp)) .... Qid 6)  Amphetamine-Dextroamphetamine 20 Mg Xr24h-Cap (Amphetamine-Dextroamphetamine) .... One Daily Every Am 7)  Novolog Flexpen 100 Unit/ml Soln (Insulin Aspart) .... 2 Units Three Times A Day (Just Before Each Meal)  Allergies (verified): 1)  Keflex (Cephalexin)  Past History:  Past Medical History: Last updated: 04/03/2007 Diabetes mellitus, type II GERD Benign prostatic hypertrophy ADHD ED chronic hepatitis C  Review of Systems  The patient denies hypoglycemia.    Physical Exam  General:  normal appearance.   Psych:  Alert and cooperative; normal mood and affect; normal attention span and concentration.   Additional Exam:  Hemoglobin A1C       [H]  8.0 %       Impression & Recommendations:   Problem # 1:  DIABETES MELLITUS, TYPE II (ICD-250.00) needs increased rx  Medications Added to Medication List This Visit: 1)  Novolog Flexpen 100 Unit/ml Soln (Insulin aspart) .... 5 units three times a day (just before each meal)  Other Orders: TLB-A1C / Hgb A1C (Glycohemoglobin) (83036-A1C)  Patient Instructions: 1)  check your blood sugar 3 times a day.  vary the time of day when you check, between before the 3 meals, and at bedtime.  also check if you have symptoms of your blood sugar being too high or too low.  please keep a record of the readings and bring it to your next appointment here.  please call us sooner if you are having low blood sugar episodes. 2)  continue lantus to 6 units at night. 3)  increase novolog to 5 units three times a day (just before each meal) 4)  Please schedule a follow-up appointment in 3 months. 5)  stop janumet. 6)  tests are being ordered for you today.  a few days after the test(s), please call [phone] to hear your test results. 7)  (update: i left message on phone-tree:  rx as we discussed)   Immunization History:  Influenza Immunization History:    Influenza:  historical (12/23/2008)

## 2010-03-18 NOTE — Consult Note (Signed)
[facility]: [facility]   Imported By: Lanelle Bal 08/24/2007 11:31:07  _____________________________________________________________________  External Attachment:    Type:   Image     Comment:   External Document

## 2010-03-18 NOTE — Progress Notes (Signed)
  Phone Note Call from Patient   Caller: Patient Call For: k Summary of Call: refill adderall- wants regular not time-released Initial call taken by: Raechel Ache, RN,  February 16, 2009 11:41 AM    Prescriptions: AMPHETAMINE SALT COMBO 10 MG  TABS (AMPHETAMINE-DEXTROAMPHETAMINE) one every 8 hours as needed  #90 x 0   Entered and Authorized by:   Gordy Savers  MD   Signed by:   Gordy Savers  MD on 02/16/2009   Method used:   Print then Give to Patient   RxID:   [id number]

## 2010-03-18 NOTE — Progress Notes (Signed)
Summary: CBG  Phone Note Call from Patient Call back at Work Phone [phone]   Caller: Patient Summary of Call: Pt called to report CBGs to [facility], fasting average 149 and daytime average 114. Pt states that he realized that Rx recieved from [facility] had expired over 1 year ago and is wondering if using this expired medicine could have contributed to elevated CBG? Initial call taken by: Margaret Pyle, CMA,  January 27, 2010 11:40 AM  Follow-up for Phone Call        please continue same rx.  it is unlikely that the fact that medication is expired will matter, but i am happy to renew rx. Follow-up by: Minus Breeding MD,  January 27, 2010 11:59 AM  Additional Follow-up for Phone Call Additional follow up Details #1::        Pt informed via VM Additional Follow-up by: Margaret Pyle, CMA,  January 27, 2010 2:16 PM

## 2010-03-18 NOTE — Progress Notes (Signed)
Summary: REFILL  Phone Note Call from Patient Call back at [phone]   Caller: Patient-LIVE CALL Reason for Call: Refill Medication Summary of Call: RF AMPHETAMINE SALTS. CALL WHEN READY. APPT IS ON SEPT 9TH. Initial call taken by: Warnell Forester,  October 06, 2008 8:41 AM    Prescriptions: AMPHETAMINE SALT COMBO 10 MG  TABS (AMPHETAMINE-DEXTROAMPHETAMINE) one every 8 hours as needed  #90 x 0   Entered by:   Raechel Ache, RN   Authorized by:   Roderick Pee MD   Signed by:   Raechel Ache, RN on 10/06/2008   Method used:   Print then Give to Patient   RxID:   [id number] AMPHETAMINE SALT COMBO 20 MG  TABS (AMPHETAMINE-DEXTROAMPHETAMINE) one two times a day to three times a day  #60 x 0   Entered by:   Raechel Ache, RN   Authorized by:   Roderick Pee MD   Signed by:   Raechel Ache, RN on 10/06/2008   Method used:   Print then Give to Patient   RxID:   [id number] AMPHETAMINE SALT COMBO 10 MG  TABS (AMPHETAMINE-DEXTROAMPHETAMINE) one every 8 hours as needed  #90 x 0   Entered by:   Raechel Ache, RN   Authorized by:   Gordy Savers  MD   Signed by:   Raechel Ache, RN on 10/06/2008   Method used:   Print then Give to Patient   RxID:   [id number] AMPHETAMINE SALT COMBO 20 MG  TABS (AMPHETAMINE-DEXTROAMPHETAMINE) one two times a day to three times a day  #60 x 0   Entered by:   Raechel Ache, RN   Authorized by:   Gordy Savers  MD   Signed by:   Raechel Ache, RN on 10/06/2008   Method used:   Print then Give to Patient   RxID:   [id number]

## 2010-03-18 NOTE — Progress Notes (Signed)
Summary: needs rx adderall  Medications Added ADDERALL XR 20 MG CP24 (AMPHETAMINE-DEXTROAMPHETAMINE) Take 1 capsule by mouth once a day AMARYL 4 MG TABS (GLIMEPIRIDE) Take 1 tablet by mouth every morning METFORMIN HCL 500 MG TB24 (METFORMIN HCL) Take 2 tablet by mouth every night      Allergies Added: KEFLEX (CEPHALEXIN) Phone Note Call from Patient Call back at [phone]   Caller: Patient Call For: drk/dr Magin Balbi Summary of Call: pt needs adderall xr  pt would like to pick up today if possible dr Kirtland Bouchard writes rx for 3 months Initial call taken by: Heron Sabins,  October 13, 2006 9:51 AM  Follow-up for Phone Call        I will write a prescription for Adderall 20 next R1 a day, number 30, and two refills.  Please call the patient Michael Li and have him come by and pick up today Follow-up by: Roderick Pee MD,  October 13, 2006 10:26 AM  Additional Follow-up for Phone Call Additional follow up Details #1::        Phone Call Completed Additional Follow-up by: Raechel Ache, RN,  October 13, 2006 10:44 AM   New Allergies: KEFLEX (CEPHALEXIN) New/Updated Medications: ADDERALL XR 20 MG CP24 (AMPHETAMINE-DEXTROAMPHETAMINE) Take 1 capsule by mouth once a day AMARYL 4 MG TABS (GLIMEPIRIDE) Take 1 tablet by mouth every morning METFORMIN HCL 500 MG TB24 (METFORMIN HCL) Take 2 tablet by mouth every night New Allergies: KEFLEX (CEPHALEXIN)

## 2010-03-18 NOTE — Progress Notes (Signed)
Summary: refill  Phone Note Call from Patient Call back at [phone]   Caller: pt live Call For: K  Summary of Call: amphetamine salts 20 mg  Initial call taken by: Roselle Locus,  May 02, 2008 8:03 AM    Needs ROV  Prescriptions: AMPHETAMINE SALT COMBO 20 MG  TABS (AMPHETAMINE-DEXTROAMPHETAMINE) one two times a day to three times a day  #90 x 0   Entered and Authorized by:   Gordy Savers  MD   Signed by:   Gordy Savers  MD on 05/02/2008   Method used:   Print then Give to Patient   RxID:   [id number]

## 2010-03-18 NOTE — Progress Notes (Signed)
  Phone Note Call from Patient   Caller: Patient Summary of Call: wants to know the status of his pre-approval for adderal XR- form sent last week and hasn't heard anything. Initial call taken by: Raechel Ache, RN,  November 06, 2008 3:46 PM  Follow-up for Phone Call        Prior Berkley Harvey has been ordered. Have not received it yet. Follow-up by: Darra Lis RMA,  November 06, 2008 4:13 PM  Additional Follow-up for Phone Call Additional follow up Details #1::        notified patient. Additional Follow-up by: Raechel Ache, RN,  November 06, 2008 4:30 PM

## 2010-03-18 NOTE — Progress Notes (Signed)
Summary: MEDICINE APPROVED  Phone Note From Pharmacy   Caller: [facility] Call For: K  Summary of Call: AMPHETAMINE SALT COMBO TABLET AHS BEEN APPROVED FOR 1 YEAR. STARTING ON 11-28-07 TO 12-18-2008. Initial call taken by: Celine Ahr,  December 19, 2007 3:42 PM  Follow-up for Phone Call        noted Follow-up by: Gordy Savers  MD,  December 19, 2007 6:24 PM

## 2010-03-18 NOTE — Progress Notes (Signed)
Summary: Adderal  Phone Note Call from Patient Call back at Home Phone [phone]   Summary of Call: [phone] Pt number Pt would like to have his generic Adderal 10 mg. and 20 mg. pres. written, and please call when ready. Initial call taken by: Lynann Beaver CMA,  Jul 10, 2007 11:45 AM      Prescriptions: AMPHETAMINE SALT COMBO 10 MG  TABS (AMPHETAMINE-DEXTROAMPHETAMINE) one every 8 hours as needed  #90 x 0   Entered and Authorized by:   Gordy Savers  MD   Signed by:   Gordy Savers  MD on 07/10/2007   Method used:   Print then Give to Patient   RxID:   [id number] AMPHETAMINE SALT COMBO 20 MG  TABS (AMPHETAMINE-DEXTROAMPHETAMINE) one two times a day to three times a day  #90 x 0   Entered and Authorized by:   Gordy Savers  MD   Signed by:   Gordy Savers  MD on 07/10/2007   Method used:   Print then Give to Patient   RxID:   [id number]

## 2010-03-18 NOTE — Progress Notes (Signed)
Summary: is adderall supposed to be 10mg  or 20 mg  Phone Note From Pharmacy   Caller: [facility] Call For: k  Summary of Call: is his adderall supposed to be 10 mg or 20 mg.  07-10-2007  rx written for both 10 and 20 mg  Initial call taken by: Roselle Locus,  July 20, 2007 2:44 PM  Follow-up for Phone Call        called and discussed with pharmacy Follow-up by: Gordy Savers  MD,  July 20, 2007 4:03 PM

## 2010-03-18 NOTE — Assessment & Plan Note (Signed)
Summary: 2 DAY ROV // RS/pt rsc/cjr   Vital Signs:  Patient profile:   55 year old male Weight:      196 pounds Temp:     98.5 degrees F oral BP sitting:   110 / 70  (right arm) Cuff size:   regular  Vitals Entered By: Duard Brady LPN (May 28, 2009 4:16 PM) CC: f/u toe drainage     fbs 156 Is Patient Diabetic? Yes Did you bring your meter with you today? No   Primary Care Provider:  Dr Eleonore Chiquito  CC:  f/u toe drainage     fbs 156.  History of Present Illness: 55 year old patient who is seen today in in follow-up.  He was agitated earlier for a subungual hematoma involving his right great toe.  At the present time.  He is having only minimal pain and swelling.  Range of motion is full.  He was empirically placed on Avelox.  He has recently been seen by endocrinology and a hemoglobin A1c was in excess of 10.  He is on basal insulin as well as mealtime insulin.  Fasting blood sugars remain quite elevated  Allergies: 1)  Keflex (Cephalexin)  Past History:  Past Medical History: Reviewed history from 04/03/2007 and no changes required. Diabetes mellitus, type II GERD Benign prostatic hypertrophy ADHD ED chronic hepatitis C  Physical Exam  General:  Well-developed,well-nourished,in no acute distress; alert,appropriate and cooperative throughout examination;blood pressure 110/70 Msk:  soft tissue swelling and ecchymoses involving his right great toe range of motion was fairly preserved.  His able to walk without difficulty and was experiencing only modest pain   Impression & Recommendations:  Problem # 1:  SUBUNGUAL HEMATOMA (ICD-923.3)  Problem # 2:  DIABETES MELLITUS, TYPE II (ICD-250.00)  His updated medication list for this problem includes:    Lantus Solostar 100 Unit/ml Soln (Insulin glargine) .Marland Kitchen... 20  units at bedtime    Humalog Kwikpen 100 Unit/ml Soln (Insulin lispro (human)) .Marland Kitchen... Three times a day (just before each meal) 15-15-12  units  Complete Medication List: 1)  Freestyle Lite Strp (Glucose blood) .... Qid, and lancets.  variable glucoses. 250.01 2)  Lantus Solostar 100 Unit/ml Soln (Insulin glargine) .... 20  units at bedtime 3)  Bd Disp Needles 25g X 5/8" Misc (Needle (disp)) .... Qid 4)  Amphetamine-dextroamphetamine 10 Mg Tabs (Amphetamine-dextroamphetamine) .... One tab daily as needed 5)  Humalog Kwikpen 100 Unit/ml Soln (Insulin lispro (human)) .... Three times a day (just before each meal) 15-15-12 units 6)  Avelox 400 Mg Tabs (Moxifloxacin hcl) .... One by mouth once daily for 7 days  Patient Instructions: 1)  increase your Lantus insulin from 15 to 20 units; 2)  titrate Lantus 4 units every week until blood sugar is consistent less than  150;  3)  follow-up endocrinology as scheduled

## 2010-03-18 NOTE — Medication Information (Signed)
Summary: Amphetamine Salt Combo Approved  Amphetamine Salt Combo Approved   Imported By: Maryln Gottron 12/28/2009 10:31:35  _____________________________________________________________________  External Attachment:    Type:   Image     Comment:   External Document

## 2010-03-18 NOTE — Progress Notes (Signed)
Summary: refill adderall  Phone Note Refill Request   Refills Requested: Medication #1:  AMPHETAMINE-DEXTROAMPHETAMINE 15 MG TABS 1 two times a day as needed 90 days rx    call when ready [phone]  Initial call taken by: Duard Brady LPN,  January 19, 2010 11:46 AM  Follow-up for Phone Call        done Follow-up by: Gordy Savers  MD,  January 19, 2010 12:49 PM  Additional Follow-up for Phone Call Additional follow up Details #1::        pt aware - rx ready for pick up . KIK Additional Follow-up by: Duard Brady LPN,  January 19, 2010 1:23 PM    Prescriptions: AMPHETAMINE-DEXTROAMPHETAMINE 15 MG TABS (AMPHETAMINE-DEXTROAMPHETAMINE) 1 two times a day as needed  #60 x 0   Entered and Authorized by:   Gordy Savers  MD   Signed by:   Gordy Savers  MD on 01/19/2010   Method used:   Print then Give to Patient   RxID:   [id number] AMPHETAMINE-DEXTROAMPHETAMINE 15 MG TABS (AMPHETAMINE-DEXTROAMPHETAMINE) 1 two times a day as needed  #60 x 0   Entered and Authorized by:   Gordy Savers  MD   Signed by:   Gordy Savers  MD on 01/19/2010   Method used:   Print then Give to Patient   RxID:   [id number] AMPHETAMINE-DEXTROAMPHETAMINE 15 MG TABS (AMPHETAMINE-DEXTROAMPHETAMINE) 1 two times a day as needed  #60 x 0   Entered and Authorized by:   Gordy Savers  MD   Signed by:   Gordy Savers  MD on 01/19/2010   Method used:   Print then Give to Patient   RxID:   [id number]

## 2010-03-18 NOTE — Assessment & Plan Note (Signed)
Summary: 1 mo f/u / # / cd   Vital Signs:  Patient profile:   55 year old male Height:      69 inches (175.26 cm) Weight:      196.38 pounds (89.26 kg) O2 Sat:      90 % on Room air Temp:     97.5 degrees F (36.39 degrees C) oral Pulse rate:   84 / minute BP sitting:   132 / 82  (left arm) Cuff size:   regular  Vitals Entered By: Josph Macho RMA (May 22, 2009 8:16 AM)  O2 Flow:  Room air CC: 1 month follow up/ CF Is Patient Diabetic? Yes   Referring Provider:  Dr Eleonore Chiquito Primary Provider:  Dr Eleonore Chiquito  CC:  1 month follow up/ CF.  History of Present Illness: no cbg record, but states cbg's are still persistently in the 200's.  it is highest in the afternoon, and lowest at hs.  pt states he feels well in general.    Current Medications (verified): 1)  Amphetamine Salt Combo 10 Mg  Tabs (Amphetamine-Dextroamphetamine) .... One Every 8 Hours As Needed 2)  Freestyle Lite   Strp (Glucose Blood) .... Qid, and Lancets.  Variable Glucoses. 250.01 3)  Lantus Solostar 100 Unit/ml Soln (Insulin Glargine) .Marland Kitchen.. 12 Units At Bedtime 4)  Bd Disp Needles 25g X 5/8" Misc (Needle (Disp)) .... Qid 5)  Amphetamine-Dextroamphetamine 20 Mg Xr24h-Cap (Amphetamine-Dextroamphetamine) .... One Daily Every Am 6)  Humalog Kwikpen 100 Unit/ml Soln (Insulin Lispro (Human)) .Marland Kitchen.. 11 Units Three Times A Day (Just Before Each Meal)  Allergies (verified): 1)  Keflex (Cephalexin)  Past History:  Past Medical History: Last updated: 04/03/2007 Diabetes mellitus, type II GERD Benign prostatic hypertrophy ADHD ED chronic hepatitis C  Review of Systems  The patient denies hypoglycemia.    Physical Exam  General:  normal appearance.   Neck:  Supple without thyroid enlargement or tenderness.  Additional Exam:  Hemoglobin A1C       [H]  10.1 %    Impression & Recommendations:  Problem # 1:  DIABETES MELLITUS, TYPE II (ICD-250.00) needs increased rx it appears that , over  the past 8-12 mos, he has lost endogenous insulin production capacity  Medications Added to Medication List This Visit: 1)  Lantus Solostar 100 Unit/ml Soln (Insulin glargine) .Marland Kitchen.. 15 units at bedtime 2)  Humalog Kwikpen 100 Unit/ml Soln (Insulin lispro (human)) .... Three times a day (just before each meal) 15-15-12 units  Other Orders: TLB-A1C / Hgb A1C (Glycohemoglobin) (83036-A1C) Est. Patient Level III (19147)  Patient Instructions: 1)  check your blood sugar 3 times a day.  vary the time of day when you check, between before the 3 meals, and at bedtime.  also check if you have symptoms of your blood sugar being too high or too low.  please keep a record of the readings and bring it to your next appointment here.  please call us sooner if you are having low blood sugar episodes. 2)  tests are being ordered for you today.  a few days after the test(s), please call [phone] to hear your test results. 3)  pending the test results, please: 4)  increase lantus to 15 units at night. 5)  increase novolog to three times a day (just before each meal) 15-15-12 units. 6)  Please schedule a follow-up appointment in 6 weeks.

## 2010-03-18 NOTE — Progress Notes (Signed)
Summary: Phone note  Phone Note Call from Patient   Summary of Call: Pateint requesting refill for amphetamine salts. Patient would like to pick it up tomorrow. He can be reached at [phone].  Initial call taken by: Darra Lis RMA,  June 12, 2008 10:02 AM      Prescriptions: AMPHETAMINE SALT COMBO 10 MG  TABS (AMPHETAMINE-DEXTROAMPHETAMINE) one every 8 hours as needed  #90 x 0   Entered and Authorized by:   Gordy Savers  MD   Signed by:   Gordy Savers  MD on 06/12/2008   Method used:   Print then Give to Patient   RxID:   [id number] AMPHETAMINE SALT COMBO 20 MG  TABS (AMPHETAMINE-DEXTROAMPHETAMINE) one two times a day to three times a day  #60 x 0   Entered and Authorized by:   Gordy Savers  MD   Signed by:   Gordy Savers  MD on 06/12/2008   Method used:   Print then Give to Patient   RxID:   [id number] AMPHETAMINE SALT COMBO 10 MG  TABS (AMPHETAMINE-DEXTROAMPHETAMINE) one every 8 hours as needed  #90 x 0   Entered and Authorized by:   Gordy Savers  MD   Signed by:   Gordy Savers  MD on 06/12/2008   Method used:   Print then Give to Patient   RxID:   [id number] AMPHETAMINE SALT COMBO 20 MG  TABS (AMPHETAMINE-DEXTROAMPHETAMINE) one two times a day to three times a day  #60 x 0   Entered and Authorized by:   Gordy Savers  MD   Signed by:   Gordy Savers  MD on 06/12/2008   Method used:   Print then Give to Patient   RxID:   [id number] AMPHETAMINE SALT COMBO 10 MG  TABS (AMPHETAMINE-DEXTROAMPHETAMINE) one every 8 hours as needed  #90 x 0   Entered and Authorized by:   Gordy Savers  MD   Signed by:   Gordy Savers  MD on 06/12/2008   Method used:   Print then Give to Patient   RxID:   [id number] AMPHETAMINE SALT COMBO 20 MG  TABS (AMPHETAMINE-DEXTROAMPHETAMINE) one two times a day to three times a day  #60 x 0   Entered and Authorized by:   Gordy Savers  MD    Signed by:   Gordy Savers  MD on 06/12/2008   Method used:   Print then Give to Patient   RxID:   [id number]   Appended Document: Phone note Patient has not heard anything about these rxs.  Transferred to front desk to check about these being available.

## 2010-03-18 NOTE — Assessment & Plan Note (Signed)
Summary: new / type 2 diabetes/uhc/#/kwiatkoswki/cd   Vital Signs:  Patient profile:   55 year old male Height:      67.5 inches Weight:      192 pounds BMI:     29.73 O2 Sat:      94 % on Room air Temp:     97.5 degrees F oral Pulse rate:   78 / minute BP sitting:   140 / 84  (left arm)  Vitals Entered By: Bill Salinas CMA (October 30, 2008 11:11 AM)  O2 Flow:  Room air CC: New endo consult for diabetic management/ab   Referring Provider:  Dr Eleonore Chiquito Primary Provider:  Dr Eleonore Chiquito  CC:  New endo consult for diabetic management/ab.  History of Present Illness: pt states 3 years h/o dm.  he denies knowing of any chronic complications.  he has been on insulin x 1 week .  he takes lantus 12 units qd.  no cbg record, but states cbg's are improved to low to mid-100's.  it is in general higher later in the day. pt says his diet and exercise are "good."   symptomatically, pt states cramps of the legs, but no associated numbness   Current Medications (verified): 1)  Amphetamine Salt Combo 10 Mg  Tabs (Amphetamine-Dextroamphetamine) .... One Every 8 Hours As Needed 2)  Freestyle Lite   Strp (Glucose Blood) .Marland Kitchen.. 1 Qd 3)  Janumet 50-1000 Mg Tabs (Sitagliptin-Metformin Hcl) .Marland Kitchen.. 1 Two Times A Day 4)  Lantus Solostar 100 Unit/ml Soln (Insulin Glargine) .Marland Kitchen.. 12 Units At Bedtime 5)  Bd Disp Needles 25g X 5/8" Misc (Needle (Disp)) .... As Directed 6)  Amphetamine-Dextroamphetamine 20 Mg Xr24h-Cap (Amphetamine-Dextroamphetamine) .... One Daily Every Am  Allergies (verified): 1)  Keflex (Cephalexin)  Past History:  Past Medical History: Last updated: 04/03/2007 Diabetes mellitus, type II GERD Benign prostatic hypertrophy ADHD ED chronic hepatitis C  Family History: Reviewed history from 04/03/2007 and no changes required. father died age 90, MI mother died age 42, palpitations of COPD no siblings no dm in immediate family  Family history positive for diabetes mellitus in two maternal aunts affected paternal grandmother had colon cancer  Social History: Reviewed history from 10/23/2008 and no changes required. Divorced Regular [employer] 2  children former smoker  Review of Systems       denies weight loss, blurry vision, headache, chest pain, sob, n/v, urinary frequency, cramps, excessive diaphoresis, memory loss, depression, hypoglycemia, bruising.   Physical Exam  General:  normal appearance.   Head:  head: no deformity eyes: no periorbital swelling, no proptosis external nose and ears are normal mouth: no lesion seen  Neck:  2 healed scars are present--one is from a tracheostomy, and the other is at the right anterolat neck.  i do not appreciate a nodule in the thyroid or elsewhere in the neck  Lungs:  Clear to auscultation bilaterally. Normal respiratory effort.  Heart:  Regular rate and rhythm without murmurs or gallops noted. Normal S1,S2.   Abdomen:  abdomen is soft, nontender.  no hepatosplenomegaly.   not distended.  no hernia  Msk:  muscle bulk and strength are grossly normal.  no obvious joint swelling.  gait is normal and steady  Pulses:  dorsalis pedis intact bilat.  no carotid bruit  Extremities:  no deformity.  no ulcer on the feet.  feet are of normal color and temp.  no edema  Neurologic:  cn 2-12 grossly intact.   readily moves all 4's.  sensation is intact to touch on the feet  Skin:  normal texture and temp.  no rash.  not diaphoretic  Cervical Nodes:  No significant adenopathy.  Psych:  Alert and cooperative; normal mood and affect; normal attention span and concentration.   Additional Exam:  Hemoglobin A1C       [H]  9.1 %    Impression & Recommendations:  Problem # 1:  DIABETES MELLITUS, TYPE II (ICD-250.00) needs increased rx  Problem # 2:  leg cramps unlikely to be diabetes-related   Problem # 3:  ATTENTION DEFICIT HYPERACTIVITY DISORDER (ICD-314.01) this complicates the rx of dm  Medications Added to Medication List This Visit: 1)  Lantus Solostar 100 Unit/ml Soln (Insulin glargine) .... 6 units at bedtime 2)  Bd Disp Needles 25g X 5/8" Misc (Needle (disp)) .... Qid 3)  Novolog Flexpen 100 Unit/ml Soln (Insulin aspart) .... 2 units three times a day (just before each meal)  Other Orders: Consultation Level IV (04540)  Patient Instructions: 1)  we discussed the importance of diet and exercise therapy and the risks of diabetes.  you should see an eye doctor every year. 2)  i told pt we will need to take this complex situation in stages 3)  check your blood sugar 3 times a day.  vary the time of day when you check, between before the 3 meals, and at bedtime.  also check if you have symptoms of your blood sugar being too high or too low.  please keep a record of the readings and bring it to your next appointment here.  please call us sooner if you are having low blood sugar episodes. 4)  it is very important to keep good control of blood pressure and cholesterol, especially in those with diabetes.  please discuss these with your doctor.  you should take an aspirin every day, unless you have been advised by a doctor not to. 5)  reduce lantus to 6 units at night 6)  start novolog 2 units three times a day (just before each meal) 7)  Please schedule a follow-up appointment in 2 weeks. Prescriptions: BD DISP NEEDLES 25G X 5/8" MISC (NEEDLE (DISP)) qid  #120 x 11   Entered and Authorized by:   Minus Breeding MD   Signed by:   Minus Breeding MD on 10/30/2008   Method used:   Electronically to        [facility] [address] [phone]* (retail)       [address]       [address]       [place], [place]  [address]       Ph: [phone]       Fax: [phone]   RxID:   [id number]  NOVOLOG FLEXPEN 100 UNIT/ML SOLN (INSULIN ASPART) 2 units three times a day (just before each meal)  #1 box x 11   Entered and Authorized by:   Minus Breeding MD   Signed by:   Minus Breeding MD on 10/30/2008   Method used:   Electronically to        [facility] [address] [phone]* (retail)       [address]       [address]       [place], [place]  [address]       Ph: [phone]       Fax: [phone]   RxID:   [id number]

## 2010-03-18 NOTE — Medication Information (Signed)
Summary: Renewal letter for [name]  Renewal letter for Amphetamine /[employer]   Imported By: Sherian Rein 10/23/2009 09:56:26  _____________________________________________________________________  External Attachment:    Type:   Image     Comment:   External Document

## 2010-03-18 NOTE — Assessment & Plan Note (Signed)
Summary: FU Michael Li  #   Vital Signs:  Patient profile:   55 year old male Height:      67.5 inches (171.45 cm) Weight:      190 pounds (86.36 kg) O2 Sat:      94 % on Room air Temp:     96.9 degrees F (36.06 degrees C) oral Pulse rate:   76 / minute BP sitting:   130 / 84  (left arm) Cuff size:   large  Vitals Entered By: Josph Macho CMA (November 24, 2008 8:20 AM)  O2 Flow:  Room air CC: follow-up visit/ pt states he would like a new meter/ CF Is Patient Diabetic? Yes   Referring Provider:  Dr Eleonore Chiquito Primary Provider:  Dr Eleonore Chiquito  CC:  follow-up visit/ pt states he would like a new meter/ CF.  History of Present Illness: no cbg record, but states cbg's are much better.  he says it is highest in mid-afternoon, and lowest in the mid-morning.  he feels much better.  Current Medications (verified): 1)  Amphetamine Salt Combo 10 Mg  Tabs (Amphetamine-Dextroamphetamine) .... One Every 8 Hours As Needed 2)  Freestyle Lite   Strp (Glucose Blood) .Marland Kitchen.. 1 Qd 3)  Janumet 50-1000 Mg Tabs (Sitagliptin-Metformin Hcl) .Marland Kitchen.. 1 Two Times A Day 4)  Lantus Solostar 100 Unit/ml Soln (Insulin Glargine) .... 6 Units At Bedtime 5)  Bd Disp Needles 25g X 5/8" Misc (Needle (Disp)) .... Qid 6)  Amphetamine-Dextroamphetamine 20 Mg Xr24h-Cap (Amphetamine-Dextroamphetamine) .... One Daily Every Am 7)  Novolog Flexpen 100 Unit/ml Soln (Insulin Aspart) .... 2 Units Three Times A Day (Just Before Each Meal)  Allergies (verified): 1)  Keflex (Cephalexin)  Past History:  Past Medical History: Last updated: 04/03/2007 Diabetes mellitus, type II GERD Benign prostatic hypertrophy ADHD ED chronic hepatitis C  Review of Systems  The patient denies hypoglycemia.    Physical Exam  General:  normal appearance.   Skin:  insulin injection sites at triceps are normal    Impression & Recommendations:  Problem # 1:  DIABETES MELLITUS, TYPE II (ICD-250.00) Assessment Improved   Medications Added to Medication List This Visit: 1)  Freestyle Lite Strp (Glucose blood) .... Qid, and lancets.  variable glucoses. 250.01  Other Orders: Est. Patient Level III ([address])  Patient Instructions: 1)  check your blood sugar 3 times a day.  vary the time of day when you check, between before the 3 meals, and at bedtime.  also check if you have symptoms of your blood sugar being too high or too low.  please keep a record of the readings and bring it to your next appointment here.  please call us sooner if you are having low blood sugar episodes. 2)  continue lantus to 6 units at night. 3)  continue novolog 2 units three times a day (just before each meal) 4)  Please schedule a follow-up appointment in 2 months. 5)  fructosamine is declined. Prescriptions: FREESTYLE LITE   STRP (GLUCOSE BLOOD) qid, and lancets.  variable glucoses. 250.01  #120 x 11   Entered and Authorized by:   Minus Breeding MD   Signed by:   Minus Breeding MD on 11/24/2008   Method used:   Electronically to        [facility] [address] [phone]* (retail)       [address]       [address]       [place], [place]  [address]  Ph: [phone]       Fax: [phone]   RxID:   [id number]

## 2010-03-18 NOTE — Progress Notes (Signed)
  Phone Note Call from Patient   Reason for Call: Acute Illness Summary of Call: amphetimine 10 mg unavailable- what do you suggest he does? Initial call taken by: Willy Eddy, LPN,  December 07, 2009 3:07 PM  Follow-up for Phone Call        ok with pt Follow-up by: Willy Eddy, LPN,  December 07, 2009 5:14 PM    New/Updated Medications: AMPHETAMINE-DEXTROAMPHETAMINE 15 MG TABS (AMPHETAMINE-DEXTROAMPHETAMINE) 1 two times a day as needed Prescriptions: AMPHETAMINE-DEXTROAMPHETAMINE 15 MG TABS (AMPHETAMINE-DEXTROAMPHETAMINE) 1 two times a day as needed  #60 x 0   Entered by:   Willy Eddy, LPN   Authorized by:   Gordy Savers  MD   Signed by:   Willy Eddy, LPN on 29/52/8413   Method used:   Printed then faxed to ...       [facility] [address] [phone]* (retail)       [address]       [address]       [place], [place]  [address]       Ph: [phone]       Fax: [phone]   RxID:   [id number]  OK to increase to 15 mg if that dose is available

## 2010-03-18 NOTE — Progress Notes (Signed)
Summary:  to the  Phone Note Call from Patient   Caller: Patient Call For: Dr. Kirtland Bouchard Summary of Call: Pt. wouldl like a referral to the Hep C clinic, please. Please call him at [phone] Initial call taken by: [facility] CMA,  May 10, 2007 8:46 AM  Follow-up for Phone Call        faxed Records to the Hep C clinic patient is aware that it can take 2-3wks before he gets a phone call from office Follow-up by: Florentina Addison,  May 11, 2007 11:17 AM  Additional Follow-up for Phone Call Additional follow up Details #1::        Patient wants a referral to Dr. Gegick(Enodcrinology)  04/16 @ 10:45 left message Additional Follow-up by: Florentina Addison,  May 11, 2007 11:18 AM    OK Have patient increase Metformin to 1ooo mg twice daily

## 2010-03-18 NOTE — Assessment & Plan Note (Signed)
Summary: big toe---ok per dr k//ccm   Vital Signs:  Patient profile:   55 year old male Temp:     98.7 degrees F oral BP sitting:   110 / 70  (left arm) Cuff size:   regular  Vitals Entered By: Sid Falcon LPN (May 25, 2009 1:52 PM) CC: Rt great toe injury yesterday morning   History of Present Illness: Acute visit. Right great toe pain. 2 nights ago patient going to bathroom and 10 lb bronze statue fell off and struck right great toe. Immediate swelling and bruising. Increasing pain since then and difficulty ambulating. History of type 2 diabetes. No fevers or chills.  last tetanus unknown. No other injuries reported.  Progressive subungual edema and pain since injury.  Ice with no relief.  Diabetes fairly well controlled on insulin.  Allergies: 1)  Keflex (Cephalexin)  Past History:  Past Medical History: Last updated: 04/03/2007 Diabetes mellitus, type II GERD Benign prostatic hypertrophy ADHD ED chronic hepatitis C PMH reviewed for relevance  Review of Systems      See HPI  Physical Exam  General:  Well-developed,well-nourished,in no acute distress; alert,appropriate and cooperative throughout examination Heart:  normal rate and regular rhythm.   Extremities:  right great toe reveals ecchymosis and diffuse swelling. He has evidence for subungual hematoma and tenderness diffusely distal toe. Does have some mild erythema and mild warmth near the region of the nailbed   Impression & Recommendations:  Problem # 1:  SUBUNGUAL HEMATOMA (ICD-923.3)  recommended hyfrecation base of nail to release pressure after reviewing risks and benefits patient consented. Using Hyfrecator small hole made in base of nail and tremendous amount of blood and pressure released patient felt better afterwards. Given diabetes status cover with antibiotic.  Avelox samples 400 mg by mouth once daily (pt allergic to Keflex).  F/U with primary in 2 days.  Orders: Evacuation of Nail  Hematoma (16109)  Problem # 2:  DIABETES MELLITUS, TYPE II (ICD-250.00)  His updated medication list for this problem includes:    Lantus Solostar 100 Unit/ml Soln (Insulin glargine) .Marland KitchenMarland KitchenMarland KitchenMarland Kitchen 15 units at bedtime    Humalog Kwikpen 100 Unit/ml Soln (Insulin lispro (human)) .Marland Kitchen... Three times a day (just before each meal) 15-15-12 units  Problem # 3:  CONTUSION OF TOE (ICD-924.3) cannot r/o underlying fx.  discussed x-rays but pt decided against since this will no change management.  Complete Medication List: 1)  Freestyle Lite Strp (Glucose blood) .... Qid, and lancets.  variable glucoses. 250.01 2)  Lantus Solostar 100 Unit/ml Soln (Insulin glargine) .Marland Kitchen.. 15 units at bedtime 3)  Bd Disp Needles 25g X 5/8" Misc (Needle (disp)) .... Qid 4)  Amphetamine-dextroamphetamine 10 Mg Tabs (Amphetamine-dextroamphetamine) .... One tab daily as needed 5)  Humalog Kwikpen 100 Unit/ml Soln (Insulin lispro (human)) .... Three times a day (just before each meal) 15-15-12 units 6)  Avelox 400 Mg Tabs (Moxifloxacin hcl) .... One by mouth once daily for 7 days  Other Orders: Tdap => 52yrs IM (60454) Admin 1st Vaccine (09811)  Patient Instructions: 1)  schedule followup with Dr. Kirtland Bouchard in 2 days 2)  start Avelox 400 mg one tablet daily     Immunizations Administered:  Tetanus Vaccine:    Vaccine Type: Tdap    Site: left deltoid    Mfr: GlaxoSmithKline    Dose: 0.5 ml    Route: IM    Given by: Sid Falcon LPN    Exp. Date: 05/09/2011    Lot #: [id number]

## 2010-03-18 NOTE — Consult Note (Signed)
[facility]: [facility]   Imported By: Lanelle Bal 07/23/2007 13:50:16  _____________________________________________________________________  External Attachment:    Type:   Image     Comment:   External Document

## 2010-03-24 NOTE — Progress Notes (Signed)
Summary: refill omprazole   Phone Note Call from Patient   Caller: Patient Call For: Gordy Savers  MD Reason for Call: Refill Medication Summary of Call: VM - needs refill for omprazole to go to [facility]     [phone] if any questions Initial call taken by: Duard Brady LPN,  March 15, 2010 12:42 PM    Prescriptions: OMEPRAZOLE 40 MG CPDR (OMEPRAZOLE) 1 once daily  #90 x 3   Entered by:   Duard Brady LPN   Authorized by:   Gordy Savers  MD   Signed by:   Duard Brady LPN on 82/95/6213   Method used:   Faxed to ...       [facility] (mail-order)             , [place]         Ph: [phone]       Fax: [phone]   RxID:   [id number]

## 2010-04-13 ENCOUNTER — Encounter: Payer: Self-pay | Admitting: Internal Medicine

## 2010-04-13 ENCOUNTER — Ambulatory Visit (INDEPENDENT_AMBULATORY_CARE_PROVIDER_SITE_OTHER): Payer: 59 | Admitting: Internal Medicine

## 2010-04-13 DIAGNOSIS — E119 Type 2 diabetes mellitus without complications: Secondary | ICD-10-CM

## 2010-04-13 DIAGNOSIS — F909 Attention-deficit hyperactivity disorder, unspecified type: Secondary | ICD-10-CM

## 2010-04-13 MED ORDER — AMPHETAMINE-DEXTROAMPHETAMINE 20 MG PO TABS: 20.0000 mg | ORAL_TABLET | Freq: Three times a day (TID) | ORAL | Status: DC

## 2010-04-13 NOTE — Progress Notes (Signed)
  Subjective:    Patient ID: Michael Li, male    DOB: 1955-09-02, 55 y.o.   MRN: [id number]  HPI   55 year old patient who has a history of diabetes as well as ADHD. He fell 2 days ago while at home sustaining trauma to his nose as well as his right shoulder. He is followed by endocrinology for his diabetes. He has a history of ADHD and states that his present dose of Adderall has become less effective. He is on short acting Adderall 15 mg twice a day only.    Review of Systems  Constitutional: Negative for fever, chills, appetite change and fatigue.  HENT: Negative for hearing loss, ear pain, congestion, sore throat, trouble swallowing, neck stiffness, dental problem, voice change and tinnitus.   Eyes: Negative for pain, discharge and visual disturbance.  Respiratory: Negative for cough, chest tightness, wheezing and stridor.   Cardiovascular: Negative for chest pain, palpitations and leg swelling.  Gastrointestinal: Negative for nausea, vomiting, abdominal pain, diarrhea, constipation, blood in stool and abdominal distention.  Genitourinary: Negative for urgency, hematuria, flank pain, discharge, difficulty urinating and genital sores.  Musculoskeletal: Negative for myalgias, back pain, joint swelling, arthralgias and gait problem.  Skin: Positive for wound. Negative for rash.  Neurological: Negative for dizziness, syncope, speech difficulty, weakness, numbness and headaches.  Hematological: Negative for adenopathy. Does not bruise/bleed easily.  Psychiatric/Behavioral: Positive for decreased concentration. Negative for behavioral problems and dysphoric mood. The patient is not nervous/anxious.        Objective:   Physical Exam  Constitutional: He is oriented to person, place, and time. He appears well-developed and well-nourished. No distress.  HENT:  Head: Normocephalic.  Right Ear: External ear normal.  Left Ear: External ear normal.  Eyes: Conjunctivae and EOM are normal.    Neck: Normal range of motion.  Cardiovascular: Normal rate and normal heart sounds.   Pulmonary/Chest: Breath sounds normal.  Abdominal: Bowel sounds are normal.  Musculoskeletal: Normal range of motion. He exhibits no edema and no tenderness.  Neurological: He is alert and oriented to person, place, and time.  Skin:        Facial abrasion over the bridge of the nose. There is soft tissue swelling about the bridge of the nose nose appear to be symmetrical without  Deviation of the septum  Psychiatric: He has a normal mood and affect. His behavior is normal.          Assessment & Plan:   nasal trauma with abrasion and soft tissue swelling. Doubt nasal fracture.  ADHD. We'll titrate the Adderall to 20 mg 2 or 3 times daily

## 2010-04-13 NOTE — Patient Instructions (Signed)
Call or return to clinic prn if these symptoms worsen or fail to improve as anticipated.

## 2010-04-28 ENCOUNTER — Other Ambulatory Visit: Payer: Self-pay

## 2010-04-28 MED ORDER — OMEPRAZOLE 40 MG PO CPDR: 40.0000 mg | DELAYED_RELEASE_CAPSULE | Freq: Every day | ORAL | Status: DC

## 2010-04-30 ENCOUNTER — Ambulatory Visit: Payer: Self-pay | Admitting: Endocrinology

## 2010-05-05 ENCOUNTER — Ambulatory Visit (INDEPENDENT_AMBULATORY_CARE_PROVIDER_SITE_OTHER): Payer: 59 | Admitting: Endocrinology

## 2010-05-05 ENCOUNTER — Other Ambulatory Visit (INDEPENDENT_AMBULATORY_CARE_PROVIDER_SITE_OTHER): Payer: 59

## 2010-05-05 ENCOUNTER — Encounter: Payer: Self-pay | Admitting: Endocrinology

## 2010-05-05 VITALS — BP 128/82 | HR 94 | Temp 98.0°F | Ht 69.0 in | Wt 202.8 lb

## 2010-05-05 DIAGNOSIS — E119 Type 2 diabetes mellitus without complications: Secondary | ICD-10-CM

## 2010-05-05 LAB — HEMOGLOBIN A1C: Hgb A1c MFr Bld: 9.3 % — ABNORMAL HIGH (ref 4.6–6.5)

## 2010-05-05 MED ORDER — INSULIN REGULAR HUMAN 100 UNIT/ML IJ SOLN: INTRAMUSCULAR | Status: DC

## 2010-05-05 NOTE — Progress Notes (Signed)
  Subjective:    Patient ID: Michael Li, male    DOB: Mar 13, 1955, 55 y.o.   MRN: [id number]  HPI Pt says the care of his dm is being limited by frequent travel.  no cbg record, but states cbg's are rarely low, and these episodes are mild.  This happens with activity.  pt states he feels well in general.  He says cbg's are still highest in am (higher than at hs). Past Medical History  Diagnosis Date  . ATTENTION DEFICIT HYPERACTIVITY DISORDER 07/19/2006  . BENIGN PROSTATIC HYPERTROPHY 07/19/2006  . DIABETES MELLITUS, TYPE II 07/19/2006  . ERECTILE DYSFUNCTION 07/19/2006  . GERD 07/19/2006  . HEPATITIS C, CHRONIC 04/03/2007  . NEOPLASM, MALIGNANT, CARCINOMA, BASAL CELL, NOSE 10/20/2009   Past Surgical History  Procedure Date  . Appendectomy   . Tonsillectomy   . Carotid injury      R carotid injury with trach    reports that he has quit smoking. He quit smokeless tobacco use about 3 years ago. His alcohol and drug histories not on file. family history includes Cancer in his paternal grandmother and Diabetes in his maternal aunts. Allergies  Allergen Reactions  . Cephalexin     REACTION: unspecified     Review of Systems Denies loc.    Objective:   Physical Exam dorsalis pedis intact bilat.  no carotid bruit no deformity.  no ulcer on the feet.  feet are of normal color and temp.  no edema sensation is intact to touch on the feet There is slight onychomycosis at the right great toenail.      A1c=9.3 Assessment & Plan:  Dm, needs increased rx

## 2010-05-05 NOTE — Patient Instructions (Addendum)
)    check your blood sugar 3 times a day.  vary the time of day when you check, between before the 3 meals, and at bedtime.  also check if you have symptoms of your blood sugar being too high or too low.  please keep a record of the readings and bring it to your next appointment here.  please call us sooner if you are having low blood sugar episodes. 2)  blood tests are being ordered for you today.  please call [phone] to hear your test results. 3)  pending the test results, please: 4)  increase nph insulin to 35 units at night. 5)  continue regular insulin three times a day (just before each meal) 22-16-20 units.  if exertion is anticipated over the next few hours, take just 1/2 of that amount. 6)  Please schedule a follow-up appointment in 3 months.   (update: i left message on phone-tree:  rx as we discussed.  Call if cbg stays high, and tell me what time of day it is high, so we can safely increase insulin.

## 2010-06-18 ENCOUNTER — Other Ambulatory Visit: Payer: Self-pay

## 2010-06-18 MED ORDER — AMPHETAMINE-DEXTROAMPHETAMINE 20 MG PO TABS: 20.0000 mg | ORAL_TABLET | Freq: Three times a day (TID) | ORAL | Status: DC

## 2010-06-18 NOTE — Telephone Encounter (Signed)
Pt aware ok to pick up today. KIK

## 2010-08-04 ENCOUNTER — Ambulatory Visit: Payer: 59 | Admitting: Endocrinology

## 2010-08-20 ENCOUNTER — Other Ambulatory Visit (INDEPENDENT_AMBULATORY_CARE_PROVIDER_SITE_OTHER): Payer: 59

## 2010-08-20 ENCOUNTER — Encounter: Payer: Self-pay | Admitting: Endocrinology

## 2010-08-20 ENCOUNTER — Ambulatory Visit (INDEPENDENT_AMBULATORY_CARE_PROVIDER_SITE_OTHER): Payer: 59 | Admitting: Endocrinology

## 2010-08-20 VITALS — BP 138/86 | HR 98 | Temp 98.4°F | Ht 69.0 in | Wt 202.6 lb

## 2010-08-20 DIAGNOSIS — E119 Type 2 diabetes mellitus without complications: Secondary | ICD-10-CM

## 2010-08-20 LAB — HEMOGLOBIN A1C: Hgb A1c MFr Bld: 8.6 % — ABNORMAL HIGH (ref 4.6–6.5)

## 2010-08-20 NOTE — Progress Notes (Signed)
  Subjective:    Patient ID: Michael Li, male    DOB: 03/11/1955, 55 y.o.   MRN: [id number]  HPI pt states he feels well in general.  no cbg record, but states cbg's are well-controlled.  He says it is highest in the afternoon.   Past Medical History  Diagnosis Date  . ATTENTION DEFICIT HYPERACTIVITY DISORDER 07/19/2006  . BENIGN PROSTATIC HYPERTROPHY 07/19/2006  . DIABETES MELLITUS, TYPE II 07/19/2006  . ERECTILE DYSFUNCTION 07/19/2006  . GERD 07/19/2006  . HEPATITIS C, CHRONIC 04/03/2007  . NEOPLASM, MALIGNANT, CARCINOMA, BASAL CELL, NOSE 10/20/2009    Past Surgical History  Procedure Date  . Appendectomy   . Tonsillectomy   . Carotid injury      R carotid injury with trach    History   Social History  . Marital Status: Divorced    Spouse Name: N/A    Number of Children: 2  . Years of Education: N/A   Occupational History  . Cytogeneticist    Social History Main Topics  . Smoking status: Former Games developer  . Smokeless tobacco: Former Neurosurgeon    Quit date: 02/15/2007  . Alcohol Use: Not on file  . Drug Use: Not on file  . Sexually Active: Not on file   Other Topics Concern  . Not on file   Social History Narrative   Regular exercise-yes    Current Outpatient Prescriptions on File Prior to Visit  Medication Sig Dispense Refill  . amphetamine-dextroamphetamine (ADDERALL, 20MG ,) 20 MG tablet Take 1 tablet (20 mg total) by mouth 3 (three) times daily.  90 tablet  0  . aspirin 81 MG tablet Take 81 mg by mouth daily.        Marland Kitchen glucose blood (FREESTYLE LITE) test strip 1 each by Other route 4 (four) times daily. Use as instructed       . insulin NPH (HUMULIN N) 100 UNIT/ML injection Inject 35 Units into the skin at bedtime.       . insulin regular (HUMULIN R) 100 UNIT/ML injection Tid (just before each meal) 22-16-20 units  70 mL  3  . Insulin Syringe-Needle U-100 (B-D INS SYRINGE 0.5CC/31GX5/16) 31G X 5/16" 0.5 ML MISC by Does not apply route 4 (four) times daily.        Marland Kitchen  omeprazole (PRILOSEC) 40 MG capsule Take 1 capsule (40 mg total) by mouth daily.  90 capsule  3  . tadalafil (CIALIS) 20 MG tablet Take 20 mg by mouth daily as needed.          Allergies  Allergen Reactions  . Cephalexin     REACTION: unspecified    Family History  Problem Relation Age of Onset  . Diabetes Maternal Aunt   . Cancer Paternal Grandmother     colon cancer  . Diabetes Maternal Aunt     BP 138/86  Pulse 98  Temp(Src) 98.4 F (36.9 C) (Oral)  Ht 5\' 9"  (1.753 m)  Wt 202 lb 9.6 oz (91.899 kg)  BMI 29.92 kg/m2  SpO2 95% Review of Systems denies hypoglycemia    Objective:   Physical Exam GENERAL: no distress SKIN: Insulin injection sites at the triceps areas are normal    Lab Results  Component Value Date   HGBA1C 8.6* 08/20/2010   Assessment & Plan:  Dm, needs increased rx

## 2010-08-20 NOTE — Patient Instructions (Addendum)
blood tests are being ordered for you today.  please call [phone] to hear your test results.  You will be prompted to enter the 9-digit "MRN" number that appears at the top left of this page, followed by #.  Then you will hear the message. pending the test results, please: continue nph insulin 35 units at night. continue regular insulin three times a day (just before each meal) 22-16-20 units.  if exertion is anticipated over the next few hours, take just 1/2 of that amount. Please schedule a follow-up appointment in 3 months. check your blood sugar 2 times a day.  vary the time of day when you check, between before the 3 meals, and at bedtime.  also check if you have symptoms of your blood sugar being too high or too low.  please keep a record of the readings and bring it to your next appointment here.  please call us sooner if you are having low blood sugar episodes. good diet and exercise habits significanly improve the control of your diabetes.  please let me know if you wish to be referred to a dietician.  high blood sugar is very risky to your health.  you should see an eye doctor every year. controlling your blood pressure and cholesterol drastically reduces the damage diabetes does to your body.  this also applies to quitting smoking.  please discuss these with your doctor.  you should take an aspirin every day, unless you have been advised by a doctor not to. (update: i left message on phone-tree:  Increase lunch insulin to 22 units.

## 2010-09-08 ENCOUNTER — Other Ambulatory Visit: Payer: Self-pay

## 2010-09-08 MED ORDER — TADALAFIL 20 MG PO TABS: 20.0000 mg | ORAL_TABLET | Freq: Every day | ORAL | Status: DC | PRN

## 2010-09-08 NOTE — Telephone Encounter (Signed)
Refill request for cialis - faxed to [facility]

## 2010-09-30 ENCOUNTER — Other Ambulatory Visit: Payer: Self-pay | Admitting: Internal Medicine

## 2010-09-30 NOTE — Telephone Encounter (Signed)
Ok 3 months

## 2010-09-30 NOTE — Telephone Encounter (Signed)
Pt req script for amphetamine-dextroamphetamine (ADDERALL, 20MG,) 20 MG tablet  ° °

## 2010-09-30 NOTE — Telephone Encounter (Signed)
Please advise 

## 2010-10-01 MED ORDER — AMPHETAMINE-DEXTROAMPHETAMINE 20 MG PO TABS: 20.0000 mg | ORAL_TABLET | Freq: Three times a day (TID) | ORAL | Status: DC

## 2010-10-01 NOTE — Telephone Encounter (Signed)
PT AWARE RX READY FOR PIC UP

## 2010-11-11 LAB — CBC
HCT: 47.2
Hemoglobin: 16.8
MCHC: 35.7
MCV: 89.2
Platelets: 151
RBC: 5.29
RDW: 12.5
WBC: 5.3

## 2010-11-11 LAB — PROTIME-INR
INR: 0.9
Prothrombin Time: 12.2

## 2010-11-19 ENCOUNTER — Ambulatory Visit: Payer: 59 | Admitting: Endocrinology

## 2011-01-04 ENCOUNTER — Other Ambulatory Visit: Payer: Self-pay

## 2011-01-04 MED ORDER — AMPHETAMINE-DEXTROAMPHETAMINE 20 MG PO TABS: 20.0000 mg | ORAL_TABLET | Freq: Three times a day (TID) | ORAL | Status: DC

## 2011-01-04 NOTE — Telephone Encounter (Signed)
Pt request amphetamine salts prescription. Pt last seen 08/20/10. Rx last filled 12/01/10.  Pls advise.

## 2011-01-04 NOTE — Telephone Encounter (Signed)
Pt aware rx ready for pick up. 

## 2011-01-04 NOTE — Telephone Encounter (Signed)
ok 

## 2011-01-20 ENCOUNTER — Other Ambulatory Visit: Payer: Self-pay | Admitting: Endocrinology

## 2011-04-13 ENCOUNTER — Telehealth: Payer: Self-pay | Admitting: Internal Medicine

## 2011-04-13 MED ORDER — AMPHETAMINE-DEXTROAMPHETAMINE 20 MG PO TABS: 20.0000 mg | ORAL_TABLET | Freq: Three times a day (TID) | ORAL | Status: DC

## 2011-04-13 NOTE — Telephone Encounter (Signed)
Spoke with pt - rx's ready for pick up 

## 2011-04-13 NOTE — Telephone Encounter (Signed)
Pt called req refill of amphetamine-dextroamphetamine (ADDERALL, 20MG,) 20 MG tablet.  ° °

## 2011-07-25 ENCOUNTER — Ambulatory Visit (INDEPENDENT_AMBULATORY_CARE_PROVIDER_SITE_OTHER): Payer: 59 | Admitting: Internal Medicine

## 2011-07-25 ENCOUNTER — Encounter: Payer: Self-pay | Admitting: Internal Medicine

## 2011-07-25 VITALS — BP 140/90 | Temp 98.1°F | Wt 200.0 lb

## 2011-07-25 DIAGNOSIS — F909 Attention-deficit hyperactivity disorder, unspecified type: Secondary | ICD-10-CM

## 2011-07-25 DIAGNOSIS — E119 Type 2 diabetes mellitus without complications: Secondary | ICD-10-CM

## 2011-07-25 DIAGNOSIS — B182 Chronic viral hepatitis C: Secondary | ICD-10-CM

## 2011-07-25 LAB — HEMOGLOBIN A1C: Hgb A1c MFr Bld: 9 % — ABNORMAL HIGH (ref 4.6–6.5)

## 2011-07-25 MED ORDER — AMPHETAMINE-DEXTROAMPHET ER 30 MG PO CP24: 30.0000 mg | ORAL_CAPSULE | ORAL | Status: DC

## 2011-07-25 MED ORDER — AMPHETAMINE-DEXTROAMPHETAMINE 20 MG PO TABS: 20.0000 mg | ORAL_TABLET | Freq: Three times a day (TID) | ORAL | Status: DC

## 2011-07-25 MED ORDER — LIRAGLUTIDE 18 MG/3ML ~~LOC~~ SOLN: 1.2000 mg | SUBCUTANEOUS | Status: DC

## 2011-07-25 NOTE — Progress Notes (Signed)
  Subjective:    Patient ID: Michael Li, male    DOB: May 19, 1955, 56 y.o.   MRN: [id number]  HPI  56 year old patient who has a history of type 2 diabetes. He has not been seen in this office over one year but apparently is followed by Dr. Lawson Fiscal in [place]. He is also seen Dr. Everardo All here locally. He remains on 35 units of Humulin and at bedtime as well his mealtime Humalog R. He has recently been placed on Victoza one week ago. He has a history of chronic hepatitis C. No concerns or complaints. No recent LFTs He has a history of ADD and feels that his Adderall is not quite as effective as far as keeping him focused.    Review of Systems  Constitutional: Negative for fever, chills, appetite change and fatigue.  HENT: Negative for hearing loss, ear pain, congestion, sore throat, trouble swallowing, neck stiffness, dental problem, voice change and tinnitus.   Eyes: Negative for pain, discharge and visual disturbance.  Respiratory: Negative for cough, chest tightness, wheezing and stridor.   Cardiovascular: Negative for chest pain, palpitations and leg swelling.  Gastrointestinal: Negative for nausea, vomiting, abdominal pain, diarrhea, constipation, blood in stool and abdominal distention.  Genitourinary: Negative for urgency, hematuria, flank pain, discharge, difficulty urinating and genital sores.  Musculoskeletal: Negative for myalgias, back pain, joint swelling, arthralgias and gait problem.  Skin: Negative for rash.  Neurological: Negative for dizziness, syncope, speech difficulty, weakness, numbness and headaches.  Hematological: Negative for adenopathy. Does not bruise/bleed easily.  Psychiatric/Behavioral: Negative for behavioral problems and dysphoric mood. The patient is not nervous/anxious.        Objective:   Physical Exam  Constitutional: He is oriented to person, place, and time. He appears well-developed.       Blood pressure 140/80  HENT:  Head: Normocephalic.    Right Ear: External ear normal.  Left Ear: External ear normal.  Eyes: Conjunctivae and EOM are normal.  Neck: Normal range of motion.  Cardiovascular: Normal rate and normal heart sounds.   Pulmonary/Chest: Breath sounds normal.  Abdominal: Bowel sounds are normal.  Musculoskeletal: Normal range of motion. He exhibits no edema and no tenderness.  Neurological: He is alert and oriented to person, place, and time.  Psychiatric: He has a normal mood and affect. His behavior is normal.          Assessment & Plan:   Diabetes mellitus. Will titrate Victoza to 1.2 mg daily. We'll check a hemoglobin A1c more active lifestyle modest weight loss encouraged Chronic hepatitis C we'll schedule complete physical and check LFTs at that time and consider an abdominal ultrasound and alpha-fetoprotein ADHD. Will try long-acting Adderall and continue short acting Adderall as a supplement

## 2011-07-25 NOTE — Patient Instructions (Signed)
Please check your hemoglobin A1c every 3 months    It is important that you exercise regularly, at least 20 minutes 3 to 4 times per week.  If you develop chest pain or shortness of breath seek  medical attention.  You need to lose weight.  Consider a lower calorie diet and regular exercise. 

## 2011-07-26 ENCOUNTER — Other Ambulatory Visit: Payer: Self-pay | Admitting: Internal Medicine

## 2011-07-26 MED ORDER — LIRAGLUTIDE 18 MG/3ML ~~LOC~~ SOLN: 1.2000 mg | Freq: Every day | SUBCUTANEOUS | Status: DC

## 2011-07-26 NOTE — Progress Notes (Signed)
Quick Note:  Spoke with pt- informed of lab and dr. Vernon Prey instructions- to see and repeat lab in 3 mos . ______

## 2011-08-03 ENCOUNTER — Encounter: Payer: Self-pay | Admitting: Internal Medicine

## 2011-08-03 ENCOUNTER — Ambulatory Visit (INDEPENDENT_AMBULATORY_CARE_PROVIDER_SITE_OTHER): Payer: 59 | Admitting: Internal Medicine

## 2011-08-03 VITALS — BP 110/72 | Temp 97.8°F | Wt 196.0 lb

## 2011-08-03 DIAGNOSIS — E119 Type 2 diabetes mellitus without complications: Secondary | ICD-10-CM

## 2011-08-03 NOTE — Patient Instructions (Signed)
hold  Victoza for approximately 4 days;  if abdominal pain resolves and does not recur resume at a dose of 0.6;  if no recurrent pain or issues, after 2 weeks rechallenge at 1.2 dose

## 2011-08-03 NOTE — Progress Notes (Signed)
  Subjective:    Patient ID: Michael Li, male    DOB: [birth year], 56 y.o.   MRN: [id number]  HPI  56 year old patient who has a history of type 2 diabetes. Medical regimen includes Victoza  which was up titrated to 1.2 mcg recently. For the past week he has had diffuse abdominal pain and bloating. Associated symptoms include nausea but no vomiting. No change in his bowel habits. He has enjoyed much improved glycemic control   Review of Systems  Gastrointestinal: Positive for nausea and abdominal pain.       Objective:   Physical Exam  Constitutional: He is oriented to person, place, and time. He appears well-developed.  HENT:  Head: Normocephalic.  Right Ear: External ear normal.  Left Ear: External ear normal.  Eyes: Conjunctivae and EOM are normal.  Neck: Normal range of motion.  Cardiovascular: Normal rate and normal heart sounds.   Pulmonary/Chest: Breath sounds normal.  Abdominal: Soft. Bowel sounds are normal. He exhibits no distension and no mass. There is no tenderness. There is no rebound and no guarding.  Musculoskeletal: Normal range of motion. He exhibits no edema and no tenderness.  Neurological: He is alert and oriented to person, place, and time.  Psychiatric: He has a normal mood and affect. His behavior is normal.          Assessment & Plan:  Abdominal pain. Probably secondary to medication. We'll discontinue  Victoza for several days. If pain resolves and does not recur will resume 0.6 mcg dose. After 2 weeks if she remains stable we'll rechallenge at the 1.2 mcg dose.

## 2011-08-19 LAB — HM DIABETES EYE EXAM

## 2011-08-25 ENCOUNTER — Other Ambulatory Visit: Payer: Self-pay | Admitting: Internal Medicine

## 2011-09-05 ENCOUNTER — Other Ambulatory Visit: Payer: Self-pay | Admitting: *Deleted

## 2011-09-05 MED ORDER — INSULIN REGULAR HUMAN 100 UNIT/ML IJ SOLN: INTRAMUSCULAR | Status: DC

## 2011-09-05 MED ORDER — INSULIN NPH (HUMAN) (ISOPHANE) 100 UNIT/ML ~~LOC~~ SUSP: 35.0000 [IU] | Freq: Every day | SUBCUTANEOUS | Status: DC

## 2011-09-05 NOTE — Telephone Encounter (Signed)
R'cd fax from [facility] for refill of Humulin N and Humulin R insulin.

## 2011-09-26 ENCOUNTER — Ambulatory Visit (INDEPENDENT_AMBULATORY_CARE_PROVIDER_SITE_OTHER): Payer: 59 | Admitting: Family Medicine

## 2011-09-26 ENCOUNTER — Encounter: Payer: Self-pay | Admitting: Family Medicine

## 2011-09-26 VITALS — BP 120/80 | Temp 97.9°F | Wt 196.0 lb

## 2011-09-26 DIAGNOSIS — M5430 Sciatica, unspecified side: Secondary | ICD-10-CM

## 2011-09-26 MED ORDER — CYCLOBENZAPRINE HCL 10 MG PO TABS: 10.0000 mg | ORAL_TABLET | Freq: Three times a day (TID) | ORAL | Status: AC | PRN

## 2011-09-26 MED ORDER — PREDNISONE 10 MG PO TABS: ORAL_TABLET | ORAL | Status: DC

## 2011-09-26 NOTE — Progress Notes (Signed)
  Subjective:    Patient ID: Michael Li, male    DOB: 05-Jul-1955, 56 y.o.   MRN: [id number]  HPI Here for 10 days of sharp pains in the right lower back that radiate down the right leg. The leg feels numb and tingly at times. No weakness. No recent trauma. Using Advil and heat.    Review of Systems  Constitutional: Negative.   Musculoskeletal: Positive for back pain.  Neurological: Positive for numbness.       Objective:   Physical Exam  Constitutional:       Walks with a slight limp   Musculoskeletal:       Tender in the right lower back with spasms. Full ROM. Negative SLR           Assessment & Plan:  Rest, stretches. Add a steroid taper and Flexeril.  Recheck prn.

## 2011-10-18 ENCOUNTER — Ambulatory Visit (INDEPENDENT_AMBULATORY_CARE_PROVIDER_SITE_OTHER): Payer: 59 | Admitting: Internal Medicine

## 2011-10-18 ENCOUNTER — Encounter: Payer: Self-pay | Admitting: Internal Medicine

## 2011-10-18 VITALS — BP 122/76 | Wt 195.0 lb

## 2011-10-18 DIAGNOSIS — E119 Type 2 diabetes mellitus without complications: Secondary | ICD-10-CM

## 2011-10-18 MED ORDER — FREESTYLE LANCETS MISC: 1.0000 | Freq: Every day | Status: DC

## 2011-10-18 MED ORDER — METFORMIN HCL 1000 MG PO TABS: 1000.0000 mg | ORAL_TABLET | Freq: Two times a day (BID) | ORAL | Status: DC

## 2011-10-18 MED ORDER — GLUCOSE BLOOD VI STRP: 1.0000 | ORAL_STRIP | Freq: Every day | Status: DC

## 2011-10-18 MED ORDER — TRAMADOL HCL 50 MG PO TABS: 50.0000 mg | ORAL_TABLET | Freq: Three times a day (TID) | ORAL | Status: AC | PRN

## 2011-10-18 MED ORDER — HYDROCODONE-ACETAMINOPHEN 5-500 MG PO TABS: 1.0000 | ORAL_TABLET | Freq: Three times a day (TID) | ORAL | Status: AC | PRN

## 2011-10-18 NOTE — Patient Instructions (Addendum)
Most patients with low back pain will improve with time over the next two to 6 weeks.  Keep active but avoid any activities that cause pain.  Apply moist heat to the low back area several times daily. Back Injury Prevention Back injuries are extremely painful, difficult to heal, and have an effect on everything you do. After suffering one back injury, you are much more likely to experience another later on. It is important to learn how to avoid injuring or re-injuring your back. You can learn proper lifting techniques and the basics of back safety, saving yourself a lot of pain and a lifetime of back problems.   WHY DO BACK INJURIES OCCUR?  The lower part of the back holds most of the body's weight.   Every time you bend over, lift a heavy object, or sit leaning forward, you put stress on your spine.   Over time, the discs between your vertebrae can start to wear out and become damaged.   Repetitive bending and lifting can quickly cause back problems.   Even leaning forward while sitting at a desk or table can eventually cause damage and pain.  The following are contributing factors, and related tips to prevent back injury. POOR PHYSICAL CONDITION, EXTRA WEIGHT, AND INACTIVITY  Your stomach muscles provide a lot of the support needed by your back.   If you have weak stomach muscles, your back may not get all the support it needs, especially when you are lifting or carrying heavy objects.   Good general physical condition is important for preventing strains, sprains, and other injuries. Exercise regularly and try to develop good tone in your abdominal (stomach) muscles.   The more you weigh, the more stress is placed on your back every time you bend over, at a ratio of 10:1. For every pound of weight, 10 times that amount of pressure is placed on the back.   Regular aerobic exercise (walking, jogging, biking, swimming) has been shown to decrease back injuries.   Exercises that increase  balance and strength can decrease your risk of falling and injuring your back or breaking bones. Exercises such as tai chi and yoga, or any weight-bearing exercise that challenges your balance, are good for increasing balance and strength.   Stretching and strengthening exercises can also reduce the risk of injuries.   Maintaining your ideal body weight is also important for having a healthy back.  DIET  In order to keep your spine strong, you will need to get enough calcium and vitamin D in your diet, to help prevent osteoporosis (bones becoming full of pores and weak, with age or diet deficiency).   Osteoporosis is responsible for many bone fractures that lead to back pain.   Calcium can be found in dairy products, green, leafy vegetables, and products with calcium added (fortified), like some orange juices.   Although your skin makes vitamin D when you are in the sun, you can also get it from your diet.   Vitamin D is found in milk and foods that have this vitamin added (fortified).   Many adults do not get enough calcium and vitamin D in their diet.   You should talk to your caregiver about how much calcium and vitamin D you need per day. Consider taking a nutritional supplement or a multivitamin.  POOR POSTURE  Sit and stand up straight.   It is best to try to maintain the back in its natural, slight "S" shaped curve.   Avoid leaning  forward (unsupported) when you sit, or hunching over while you are standing.   A chair with good lumbar (low back) support helps protect the back when you sit.   If you work at a desk, sit close to your work so you do not need to lean over. Keep your chin tucked in. Keep your neck drawn back and elbows bent at 90 degrees to your spine.   Sit high and close to the steering wheel when you drive. Add a lumbar support to your car seat, if needed.   Avoid sitting or standing too long in 1 position. Sitting can be hard on the lower back. Take breaks, get  up, stretch and walk around frequently (at least every hour).   Avoid sleeping in an unnatural position. Sleep on your side (with knees slightly bent), or on your back (with a pillow under your knees). Do not sleep on your stomach.  OVEREXERTION AND SUDDEN TWISTS OR MOVEMENTS  Avoid working in odd, uncomfortable positions, such as when gardening, kneeling, or doing tasks that require you to bend over for long periods of time.   Strech before exerting: If you know you will be doing work that might stress your back, take the time to stretch and loosen your muscles before starting, just like an athlete before a workout. This will help you avoid painful strains and sprains.   Slow down: If you are doing a lot of heavy, repetitive lifting, work slowly. Allow yourself more recovery time between lifts. Over working your back will cost you more time later, when you need medical attention or when you find every movement painful.   It is important to recognize your physical limitations and abilities.   Do not hesitate to say, "This is too heavy for me to lift alone."   Many people have injured their backs because they were afraid to ask for help. Ask for help!   Certain actions, motions and movements are more likely than others to cause or contribute to back injuries.   Avoid heavy lifting, especially repetitive lifting over a long period of time.   Avoid twisting at the waist, while lifting or holding a heavy load.   Avoid reaching and lifting over your head, across a table, or for an object on an elevated surface. Do not lean forward and lift heavy objects that are far from your body.   Concentrate on keeping your shoulders pulled back and your low back straight.   Never bend over without bending your knees.   Bend at your knees, instead of your back, when you pick up objects. Instead of using your back like a crane, let your legs do the work.   Try not to lift heavy things higher than your  waist, or reach for objects on shelves above your head.   When lifting:   Take a balanced stance, with your feet about shoulder width apart. One foot can be behind the object and the other next to it.   Squat down to lift the object, but keep your heels off the floor.   Get as close to the object to be lifted as you can.   Lift gradually, without jerking, using (tightening) your leg, abdominal and buttock muscles. Keep the load as close to you as possible.   Keep your back straight.   Keep your chin tucked in, to maintain a relatively straight back and neck line.   Once you are standing, change directions by pointing your feet in the  direction you want to go and turning your whole body. Avoid twisting at your waist while carrying a load.   When you put a load down, use these same guidelines in reverse.   Reduce the amount of weight lifted. If you're moving books, it is better to load several small boxes rather than 1 heavy box.   Use handles and lifting straps.   Do not turn or twist while holding an object. Turn at your feet, not your back.   Avoid lifting or carrying objects with awkward or odd shapes. Get help if the shape is too awkward for you to lift and move by yourself.   The use of wide elastic belts, that can be worn and tightened to "pull in" lumbar and abdominal muscles, to prevent low back pain, is controversial.  STRESS  Tense muscles are more vulnerable to strains and spasms.   Mental stress that you experience is directed to your muscles, neck, and back.   Find constructive ways, including exercise and other relaxation techniques, to decrease the stress you experience and decrease its impact on your body.   Massage can help reduce the stress built up in your muscles and back.  ENVIRONMENTAL CAUSES AND SOLUTIONS  You could injure your back from slipping on a wet floor or ice. Avoid wet and newly mopped surfaces, and make sure that ice around your home and office  walkways is removed or treated.   Sleeping on a mattress that is too soft or too hard can hurt your back. If too soft, consider inserting a large plywood board between your mattress and box spring, or replacing your mattress. Mattresses and box springs should be replaced every 10 to 15 years, depending on the product.   Place, store, and position objects up off the floor. That way, you will not need to reach down to pick them up again.   Raise or lower shelves, so you do not need to reach, or twist your back, neck, and shoulders.   Put heavier objects on shelves at waist level, and lighter objects on lower or higher shelves.   Use carts and dollies to move objects, rather than carrying them yourself.   It is better to push a cart, dolly, lawnmower, or wheelbarrow, than it is to pull it. If you do need to pull it, force yourself to tighten your stomach muscles and try to maintain good body posture.   Use cranes, hoists, a lift table, or other lift-assist devices whenever you can.  Your caregiver can provide additional information about preventing back (and other injuries) when you seek care, to avoid a first or recurrent back injury. If you injure your back, visit your caregiver so you can be assessed and treated.   Document Released: 03/10/2004 Document Revised: 01/20/2011 Document Reviewed: 12/01/2008 [facility] Patient Information 2012 [place], [place].Sciatica Sciatica is a weakness and/or changes in sensation (tingling, jolts, hot and cold, numbness) along the path the sciatic nerve travels. Irritation or damage to lumbar nerve roots is often also referred to as lumbar radiculopathy.   Lumbar radiculopathy (Sciatica) is the most common form of this problem. Radiculopathy can occur in any of the nerves coming out of the spinal cord. The problems caused depend on which nerves are involved. The sciatic nerve is the large nerve supplying the branches of nerves going from the hip to the toes. It  often causes a numbness or weakness in the skin and/or muscles that the sciatic nerve serves. It also may cause  symptoms (problems) of pain, burning, tingling, or electric shock-like feelings in the path of this nerve. This usually comes from injury to the fibers that make up the sciatic nerve. Some of these symptoms are low back pain and/or unpleasant feelings in the following areas:  From the mid-buttock down the back of the leg to the back of the knee.   And/or the outside of the calf and top of the foot.   And/or behind the inner ankle to the sole of the foot.  CAUSES    Herniated or slipped disc. Discs are the little cushions between the bones in the back.   Pressure by the piriformis muscle in the buttock on the sciatic nerve (Piriformis Syndrome).   Misalignment of the bones in the lower back and buttocks (Sacroiliac Joint Derangement).   Narrowing of the spinal canal that puts pressure on or pinches the fibers that make up the sciatic nerve.   A slipped vertebra that is out of line with those above or beneath it.   Abnormality of the nervous system itself so that nerve fibers do not transmit signals properly, especially to feet and calves (neuropathy).   Tumor (this is rare).  Your caregiver can usually determine the cause of your sciatica and begin the treatment most likely to help you. TREATMENT   Taking over-the-counter painkillers, physical therapy, rest, exercise, spinal manipulation, and injections of anesthetics and/or steroids may be used. Surgery, acupuncture, and Yoga can also be effective. Mind over matter techniques, mental imagery, and changing factors such as your bed, chair, desk height, posture, and activities are other treatments that may be helpful. You and your caregiver can help determine what is best for you. With proper diagnosis, the cause of most sciatica can be identified and removed. Communication and cooperation between your caregiver and you is essential.  If you are not successful immediately, do not be discouraged. With time, a proper treatment can be found that will make you comfortable. HOME CARE INSTRUCTIONS    If the pain is coming from a problem in the back, applying ice to that area for 15 to 20 minutes, 3 to 4 times per day while awake, may be helpful. Put the ice in a plastic bag. Place a towel between the bag of ice and your skin.   You may exercise or perform your usual activities if these do not aggravate your pain, or as suggested by your caregiver.   Only take over-the-counter or prescription medicines for pain, discomfort, or fever as directed by your caregiver.   If your caregiver has given you a follow-up appointment, it is very important to keep that appointment. Not keeping the appointment could result in a chronic or permanent injury, pain, and disability. If there is any problem keeping the appointment, you must call back to this facility for assistance.  SEEK IMMEDIATE MEDICAL CARE IF:    You experience loss of control of bowel or bladder.   You have increasing weakness in the trunk, buttocks, or legs.   There is numbness in any areas from the hip down to the toes.   You have difficulty walking or keeping your balance.   You have any of the above, with fever or forceful vomiting.  Document Released: 01/25/2001 Document Revised: 01/20/2011 Document Reviewed: 09/14/2007 [facility] Patient Information 2012 [place], [place].

## 2011-10-18 NOTE — Progress Notes (Signed)
Subjective:    Patient ID: Michael Li, male    DOB: [birth year], 56 y.o.   MRN: [id number]  HPI  56 year old patient who is seen today for evaluation of persistent back and right hip discomfort. The patient was seen approximately a four-week ago and was treated with a prednisone dose pack at that time. He had initial improvement with the higher dosing but with tapering and discontinuation has had recurrent right hip and buttock pain. He also describes some paresthesias involving his right medial knee area. Medical issues include insulin requiring diabetes. No motor weakness  Patient has diabetes and remains on Humulin N 35 units at bedtime. He is also on Humulin R. 22/16/20 units prior to each meal. He is not on metformin therapy  Past Medical History  Diagnosis Date  . ATTENTION DEFICIT HYPERACTIVITY DISORDER 07/19/2006  . BENIGN PROSTATIC HYPERTROPHY 07/19/2006  . DIABETES MELLITUS, TYPE II 07/19/2006  . ERECTILE DYSFUNCTION 07/19/2006  . GERD 07/19/2006  . HEPATITIS C, CHRONIC 04/03/2007  . NEOPLASM, MALIGNANT, CARCINOMA, BASAL CELL, NOSE 10/20/2009    History   Social History  . Marital Status: Divorced    Spouse Name: N/A    Number of Children: 2  . Years of Education: N/A   Occupational History  . Cytogeneticist    Social History Main Topics  . Smoking status: Former Games developer  . Smokeless tobacco: Former Neurosurgeon    Quit date: 02/15/2007  . Alcohol Use: Not on file  . Drug Use: Not on file  . Sexually Active: Not on file   Other Topics Concern  . Not on file   Social History Narrative   Regular exercise-yes    Past Surgical History  Procedure Date  . Appendectomy   . Tonsillectomy   . Carotid injury      R carotid injury with trach    Family History  Problem Relation Age of Onset  . Diabetes Maternal Aunt   . Cancer Paternal Grandmother     colon cancer  . Diabetes Maternal Aunt     Allergies  Allergen Reactions  . Cephalexin     REACTION: unspecified     Current Outpatient Prescriptions on File Prior to Visit  Medication Sig Dispense Refill  . amphetamine-dextroamphetamine (ADDERALL XR) 30 MG 24 hr capsule Take 1 capsule (30 mg total) by mouth every morning.  30 capsule  0  . amphetamine-dextroamphetamine (ADDERALL, 20MG ,) 20 MG tablet Take 1 tablet (20 mg total) by mouth 3 (three) times daily.  90 tablet  0  . aspirin 81 MG tablet Take 81 mg by mouth daily.        Marland Kitchen CIALIS 20 MG tablet TAKE 1 TABLET BY MOUTH DAILY AS NEEDED  10 tablet  1  . insulin NPH (HUMULIN N) 100 UNIT/ML injection Inject 35 Units into the skin at bedtime.  6 vial  0  . insulin regular (HUMULIN R) 100 units/mL injection Inject subcutaneously (just before each meal) 22-22-20 units  60 mL  0  . Insulin Syringe-Needle U-100 (INSULIN SYRINGE .5CC/31GX5/16") 31G X 5/16" 0.5 ML MISC USE 4 TIMES DAILY  300 each  3  . Liraglutide (VICTOZA) 18 MG/3ML SOLN Inject 0.2 mLs (1.2 mg total) as directed daily.  45 mL  1  . omeprazole (PRILOSEC) 40 MG capsule Take 1 capsule (40 mg total) by mouth daily.  90 capsule  3  . metFORMIN (GLUCOPHAGE) 1000 MG tablet Take 1 tablet (1,000 mg total) by mouth 2 (two) times daily with  a meal.  180 tablet  3    BP 122/76  Wt 195 lb (88.451 kg)       Review of Systems  Musculoskeletal: Positive for back pain.  Neurological: Positive for numbness.       Objective:   Physical Exam  Constitutional: He appears well-developed and well-nourished. No distress.  Musculoskeletal: Normal range of motion.       Negative straight leg test Numbness to soft touch involving the right medial knee region Able to flex and extend the toes in feet without difficulty Able to walk on toes and heels without difficulty          Assessment & Plan:   Low back and right hip pain. Patient has numbness in the L4 distribution. Will treat with Depo-Medrol rest and warm compresses and observe. Patient has intermittent pain and some days pain is resolved.  Will consider further diagnostic studies depending on clinical response Diabetes mellitus. Will add metformin to his regimen. Recheck 1 month

## 2011-10-19 ENCOUNTER — Encounter: Payer: Self-pay | Admitting: Internal Medicine

## 2011-11-23 ENCOUNTER — Other Ambulatory Visit: Payer: Self-pay | Admitting: Internal Medicine

## 2011-11-23 MED ORDER — AMPHETAMINE-DEXTROAMPHET ER 30 MG PO CP24: 30.0000 mg | ORAL_CAPSULE | ORAL | Status: DC

## 2011-11-23 MED ORDER — AMPHETAMINE-DEXTROAMPHETAMINE 20 MG PO TABS: 20.0000 mg | ORAL_TABLET | Freq: Three times a day (TID) | ORAL | Status: DC

## 2011-11-23 NOTE — Telephone Encounter (Signed)
rx's ready for pick up 

## 2011-11-23 NOTE — Telephone Encounter (Signed)
Pt called req scripts for amphetamine-dextroamphetamine (ADDERALL XR) 30 MG 24 hr capsule and amphetamine-dextroamphetamine (ADDERALL, 20MG ,) 20 MG tablet

## 2011-11-25 ENCOUNTER — Telehealth: Payer: Self-pay | Admitting: Family Medicine

## 2011-11-25 MED ORDER — "INSULIN SYRINGE 31G X 5/16"" 0.5 ML MISC": 1.0000 | Freq: Every day | Status: DC

## 2011-11-25 NOTE — Telephone Encounter (Signed)
done

## 2011-11-25 NOTE — Telephone Encounter (Signed)
Pt requesting refills on Victoza pen needles (the needles only). Please send to [facility] on [address]. Pt is completely out.

## 2011-11-30 ENCOUNTER — Telehealth: Payer: Self-pay | Admitting: Internal Medicine

## 2011-11-30 MED ORDER — INSULIN PEN NEEDLE 32G X 4 MM MISC: 1.0000 | Freq: Every day | Status: DC

## 2011-11-30 NOTE — Telephone Encounter (Signed)
Patient called stating that syringes were called in on 11/25/11 instead of needles for pen. Please send to [facility] on [address]. Please assist and inform patient when done.

## 2011-11-30 NOTE — Telephone Encounter (Signed)
done

## 2012-01-16 ENCOUNTER — Other Ambulatory Visit: Payer: Self-pay | Admitting: *Deleted

## 2012-01-16 ENCOUNTER — Ambulatory Visit (INDEPENDENT_AMBULATORY_CARE_PROVIDER_SITE_OTHER): Payer: 59 | Admitting: Family Medicine

## 2012-01-16 ENCOUNTER — Encounter: Payer: Self-pay | Admitting: Family Medicine

## 2012-01-16 VITALS — BP 130/76 | HR 109 | Temp 97.7°F | Wt 194.0 lb

## 2012-01-16 DIAGNOSIS — IMO0002 Reserved for concepts with insufficient information to code with codable children: Secondary | ICD-10-CM

## 2012-01-16 DIAGNOSIS — S76312A Strain of muscle, fascia and tendon of the posterior muscle group at thigh level, left thigh, initial encounter: Secondary | ICD-10-CM

## 2012-01-16 MED ORDER — AMPHETAMINE-DEXTROAMPHET ER 30 MG PO CP24: 30.0000 mg | ORAL_CAPSULE | ORAL | Status: DC

## 2012-01-16 MED ORDER — AMPHETAMINE-DEXTROAMPHET ER 20 MG PO CP24: 20.0000 mg | ORAL_CAPSULE | Freq: Three times a day (TID) | ORAL | Status: DC

## 2012-01-16 MED ORDER — AMPHETAMINE-DEXTROAMPHETAMINE 20 MG PO TABS: 20.0000 mg | ORAL_TABLET | Freq: Three times a day (TID) | ORAL | Status: DC

## 2012-01-16 NOTE — Progress Notes (Signed)
Chief Complaint  Patient presents with  . left leg pain    HPI:  Left buttock/Leg Pain:  Started: was chasing dog yesterday and felt a pop in his L buttock region Trauma/injury: see above Characterized as: severe pain in L buttock region - little sore down the back of his leg - did feel it in scrotum yesterday, feels a little tingling in back of L upper leg Worse with/Better with: worse with activity and and flexion of hip, better with heat and rest Has tried: heat, pain pills he had from sciatic pain in the past - worked well Denies: weakness, loss of bowel of bladder function, fevers  ROS: See pertinent positives and negatives per HPI.  Past Medical History  Diagnosis Date  . ATTENTION DEFICIT HYPERACTIVITY DISORDER 07/19/2006  . BENIGN PROSTATIC HYPERTROPHY 07/19/2006  . DIABETES MELLITUS, TYPE II 07/19/2006  . ERECTILE DYSFUNCTION 07/19/2006  . GERD 07/19/2006  . HEPATITIS C, CHRONIC 04/03/2007  . NEOPLASM, MALIGNANT, CARCINOMA, BASAL CELL, NOSE 10/20/2009    Family History  Problem Relation Age of Onset  . Diabetes Maternal Aunt   . Cancer Paternal Grandmother     colon cancer  . Diabetes Maternal Aunt     History   Social History  . Marital Status: Divorced    Spouse Name: N/A    Number of Children: 2  . Years of Education: N/A   Occupational History  . Cytogeneticist    Social History Main Topics  . Smoking status: Former Games developer  . Smokeless tobacco: Former Neurosurgeon    Quit date: 02/15/2007  . Alcohol Use: None  . Drug Use: None  . Sexually Active: None   Other Topics Concern  . None   Social History Narrative   Regular exercise-yes    Current outpatient prescriptions:amphetamine-dextroamphetamine (ADDERALL XR) 30 MG 24 hr capsule, Take 1 capsule (30 mg total) by mouth every morning., Disp: 30 capsule, Rfl: 0;  amphetamine-dextroamphetamine (ADDERALL) 20 MG tablet, Take 1 tablet (20 mg total) by mouth 3 (three) times daily., Disp: 90 tablet, Rfl: 0;  aspirin  81 MG tablet, Take 81 mg by mouth daily.  , Disp: , Rfl:  CIALIS 20 MG tablet, TAKE 1 TABLET BY MOUTH DAILY AS NEEDED, Disp: 10 tablet, Rfl: 1;  glucose blood (FREESTYLE LITE) test strip, 1 each by Other route daily. Dx 250.00, Disp: 100 each, Rfl: 5;  insulin NPH (HUMULIN N) 100 UNIT/ML injection, Inject 35 Units into the skin at bedtime., Disp: 6 vial, Rfl: 0;  Insulin Pen Needle (BD PEN NEEDLE NANO U/F) 32G X 4 MM MISC, 1 each by Does not apply route daily., Disp: 100 each, Rfl: 1 insulin regular (HUMULIN R) 100 units/mL injection, Inject subcutaneously (just before each meal) 22-22-20 units, Disp: 60 mL, Rfl: 0;  Insulin Syringe-Needle U-100 (INSULIN SYRINGE .5CC/31GX5/16") 31G X 5/16" 0.5 ML MISC, Inject 1 each as directed daily., Disp: 100 each, Rfl: 3;  Lancets (FREESTYLE) lancets, 1 each by Other route daily. Dx 250.00, Disp: 100 each, Rfl: 5 Liraglutide (VICTOZA) 18 MG/3ML SOLN, Inject 0.2 mLs (1.2 mg total) as directed daily., Disp: 45 mL, Rfl: 1;  metFORMIN (GLUCOPHAGE) 1000 MG tablet, Take 1 tablet (1,000 mg total) by mouth 2 (two) times daily with a meal., Disp: 180 tablet, Rfl: 3;  omeprazole (PRILOSEC) 40 MG capsule, Take 1 capsule (40 mg total) by mouth daily., Disp: 90 capsule, Rfl: 3  EXAM:  Filed Vitals:   01/16/12 1048  BP: 130/76  Pulse: 109  Temp:  97.7 F (36.5 C)    There is no height on file to calculate BMI.  GENERAL: vitals reviewed and listed above, alert, oriented, appears well hydrated and in no acute distress  HEENT: atraumatic, conjunttiva clear, no obvious abnormalities on inspection of external nose and ears  NECK: no obvious masses on inspection  LUNGS: clear to auscultation bilaterally, no wheezes, rales or rhonchi, good air movement  CV: HRRR, no peripheral edema  MS/NEURO: moves all extremities without noticeable abnormality -gait - mildly antalgic -normal ROM, relatively normal muscle strength throughout in LE bilat -TTP proximal attachment at  ischial tuberosity of hamstrings and along upper medial hamstring - no swelling or ecchymosis -no other bony TPP in lower back, sacrum, hips, greater trochanters or distal attachments -neg SLRT, neg CLRT -normal sensation to light touch in LE bilaterally  PSYCH: pleasant and cooperative, no obvious depression or anxiety  ASSESSMENT AND PLAN:  Discussed the following assessment and plan:  1. Left hamstring muscle strain    -discussed implications - discussed options for further evaluation versus conservative management with observation - doubt higher grade tear per exam findings -pt opted for conservative management - refused pain medications -Patient advised to return or notify a doctor immediately if symptoms worsen or persist or new concerns arise.  Patient Instructions  Hamstring Strain  Hamstrings are the large muscles in the back of the thighs. A strain or tear injury happens when there is a sudden stretch or pull on these muscles and tendons. Tendons are cord like structures that attach muscle to bone. These injuries are commonly seen in activities such as sprinting due to sudden acceleration.  DIAGNOSIS  Often the diagnosis can be made by examination. HOME CARE INSTRUCTIONS   Apply ice to the sore area for 15 to , 3 to 4 times per day. Do this while awake for the first 2 days. Put the ice in a plastic bag, and place a towel between the bag of ice and your skin.  Keep your knee flexed when possible. This means your foot is held off the ground slightly if you are on crutches. When lying down, a pillow under the knee will take strain off the muscles and provide some relief.  If a compression bandage such as an ace wrap was applied, use it until you are seen again. You may remove it for sleeping, showers and baths. If the wrap seems to be too tight and is uncomfortable, wrap it more loosely. If your toes or foot are getting cold or blue, it is too tight.  Walk or move around  as the pain allows, or as instructed. Resume full activities as suggested by your caregiver. This is often safest when the strength of the injured leg has nearly returned to normal.  Only take over-the-counter or prescription medicines for pain, discomfort, or fever as directed by your caregiver. SEEK MEDICAL CARE IF:   You have an increase in bruising, swelling or pain.  You notice coldness or blueness of your toes or foot.  Pain relief is not obtained with medications.  You have increasing pain in the area and seem to be getting worse rather than better.  You notice your thigh getting larger in size (this could indicate bleeding into the muscle). Document Released: 10/26/2000 Document Revised: 04/25/2011 Document Reviewed: 02/03/2008 [facility] Patient Information 2013 [place], [place], [place].

## 2012-01-16 NOTE — Patient Instructions (Addendum)
Hamstring Strain   Hamstrings are the large muscles in the back of the thighs. A strain or tear injury happens when there is a sudden stretch or pull on these muscles and tendons. Tendons are cord like structures that attach muscle to bone. These injuries are commonly seen in activities such as sprinting due to sudden acceleration.   DIAGNOSIS   Often the diagnosis can be made by examination.  HOME CARE INSTRUCTIONS    Apply ice to the sore area for 15 to 20minutes, 3 to 4 times per day. Do this while awake for the first 2 days. Put the ice in a plastic bag, and place a towel between the bag of ice and your skin.   Keep your knee flexed when possible. This means your foot is held off the ground slightly if you are on crutches. When lying down, a pillow under the knee will take strain off the muscles and provide some relief.   If a compression bandage such as an ace wrap was applied, use it until you are seen again. You may remove it for sleeping, showers and baths. If the wrap seems to be too tight and is uncomfortable, wrap it more loosely. If your toes or foot are getting cold or blue, it is too tight.   Walk or move around as the pain allows, or as instructed. Resume full activities as suggested by your caregiver. This is often safest when the strength of the injured leg has nearly returned to normal.   Only take over-the-counter or prescription medicines for pain, discomfort, or fever as directed by your caregiver.  SEEK MEDICAL CARE IF:    You have an increase in bruising, swelling or pain.   You notice coldness or blueness of your toes or foot.   Pain relief is not obtained with medications.   You have increasing pain in the area and seem to be getting worse rather than better.   You notice your thigh getting larger in size (this could indicate bleeding into the muscle).  Document Released: 10/26/2000 Document Revised: 04/25/2011 Document Reviewed: 02/03/2008  [employer] Patient Information 2013  [employer]

## 2012-01-23 ENCOUNTER — Telehealth: Payer: Self-pay | Admitting: Internal Medicine

## 2012-01-23 NOTE — Telephone Encounter (Signed)
Patient Information:  Caller Name: Sam  Phone: [phone]  Patient: [place], [place]  Gender: Male  DOB: [birth year]  Age: 56 Years  PCP: Eleonore Chiquito [facility])   Symptoms  Reason For Call & Symptoms: Pt had OV  01/16/2012 for pulled hamstring, bursing has extended to above annkle and leg is swollen.  Reviewed Health History In EMR: Yes  Reviewed Medications In EMR: Yes  Reviewed Allergies In EMR: Yes  Reviewed Surgeries / Procedures: Yes  Date of Onset of Symptoms: 01/15/2012  Treatments Tried: Heat applications, Ice, pain medicine  Treatments Tried Worked: Yes  Guideline(s) Used:  Leg Injury  Disposition Per Guideline:   See Today or Tomorrow in Office  Reason For Disposition Reached:   Large swelling or bruise and size > palm of person's hand  Advice Given:  N/A  Office Follow Up:  Does the office need to follow up with this patient?: No  Instructions For The Office: N/A  RN Overrode Recommendation:  Go To [facility]  Since pt has had worsening symptoms,  with entire leg swollen, and  also because it is >  4 PM , No appt with Dr. Amador Cunas .  RN advised to be seen at [facility] now and pt agreed  RN Note:  Pt has been having  constant leg  pain  since OV 01/16/2012  for hamstring strain. HIs pain has improved , but still rates  3-5/10. Pt has seen  new bruising from buttock down past both sides of  ankle and entire leg is swollen that is new.

## 2012-01-23 NOTE — Telephone Encounter (Signed)
No follow up required, closing encounter. °

## 2012-01-23 NOTE — Telephone Encounter (Signed)
RN returned callback to pt per request 01/23/2012 at 1500  and reached answering machine. RN left message for pt to call back

## 2012-01-24 ENCOUNTER — Ambulatory Visit (INDEPENDENT_AMBULATORY_CARE_PROVIDER_SITE_OTHER): Payer: 59 | Admitting: Internal Medicine

## 2012-01-24 ENCOUNTER — Encounter: Payer: Self-pay | Admitting: Internal Medicine

## 2012-01-24 VITALS — BP 160/86 | HR 94 | Temp 97.6°F | Resp 18 | Wt 197.0 lb

## 2012-01-24 DIAGNOSIS — IMO0002 Reserved for concepts with insufficient information to code with codable children: Secondary | ICD-10-CM

## 2012-01-24 DIAGNOSIS — Z23 Encounter for immunization: Secondary | ICD-10-CM

## 2012-01-24 DIAGNOSIS — F909 Attention-deficit hyperactivity disorder, unspecified type: Secondary | ICD-10-CM

## 2012-01-24 DIAGNOSIS — E119 Type 2 diabetes mellitus without complications: Secondary | ICD-10-CM

## 2012-01-24 DIAGNOSIS — S76319A Strain of muscle, fascia and tendon of the posterior muscle group at thigh level, unspecified thigh, initial encounter: Secondary | ICD-10-CM

## 2012-01-24 NOTE — Patient Instructions (Signed)
Apply Ace bandage for comfort measures   Please check your hemoglobin A1c every 3 months

## 2012-01-24 NOTE — Progress Notes (Signed)
  Subjective:    Patient ID: Michael Li, male    DOB: [birth year], 56 y.o.   MRN: [id number]  HPI  56 year old patient who is seen today in followup;  he was seen 8 days ago and treated conservatively for a left hamstring tear he is still mildly tender and has developed ecchymoses involving the posterior thigh and down the left leg. He is improving he is ambulatory with only modest pain.  He has type 2 diabetes which has been well-controlled. He is very pleased with the addition of Victoza with improved glycemic control as well as decreased insulin requirements. He has decreased Humulin R. to 16 units before breakfast 20 units before lunch and 18 units prior to his evening meal he takes Humulin in 25 units at bedtime  Blood pressure was normal 8 days ago  Review of Systems  Constitutional: Negative for fever, chills, appetite change and fatigue.  HENT: Negative for hearing loss, ear pain, congestion, sore throat, trouble swallowing, neck stiffness, dental problem, voice change and tinnitus.   Eyes: Negative for pain, discharge and visual disturbance.  Respiratory: Negative for cough, chest tightness, wheezing and stridor.   Cardiovascular: Negative for chest pain, palpitations and leg swelling.  Gastrointestinal: Negative for nausea, vomiting, abdominal pain, diarrhea, constipation, blood in stool and abdominal distention.  Genitourinary: Negative for urgency, hematuria, flank pain, discharge, difficulty urinating and genital sores.  Musculoskeletal: Positive for gait problem. Negative for myalgias, back pain, joint swelling and arthralgias.  Skin: Negative for rash.  Neurological: Negative for dizziness, syncope, speech difficulty, weakness, numbness and headaches.  Hematological: Negative for adenopathy. Does not bruise/bleed easily.  Psychiatric/Behavioral: Negative for behavioral problems and dysphoric mood. The patient is not nervous/anxious.        Objective:   Physical Exam   Constitutional: He appears well-developed and well-nourished. No distress.  Musculoskeletal:       Mild tenderness soft tissue swelling and ecchymoses involving the left posterior thigh there is also some ecchymosis that extended down the left medial lower leg          Assessment & Plan:   Status post partial left hamstring tear. The patient is fairly comfortable we'll continue conservative management. He will apply an Ace bandage Diabetes mellitus stable  Recheck 3 months

## 2012-06-29 ENCOUNTER — Ambulatory Visit (INDEPENDENT_AMBULATORY_CARE_PROVIDER_SITE_OTHER): Payer: 59 | Admitting: Internal Medicine

## 2012-06-29 ENCOUNTER — Other Ambulatory Visit: Payer: Self-pay | Admitting: *Deleted

## 2012-06-29 ENCOUNTER — Encounter: Payer: Self-pay | Admitting: Internal Medicine

## 2012-06-29 VITALS — BP 140/80 | HR 94 | Temp 98.5°F | Resp 20 | Wt 195.0 lb

## 2012-06-29 DIAGNOSIS — E119 Type 2 diabetes mellitus without complications: Secondary | ICD-10-CM

## 2012-06-29 DIAGNOSIS — F909 Attention-deficit hyperactivity disorder, unspecified type: Secondary | ICD-10-CM

## 2012-06-29 DIAGNOSIS — K219 Gastro-esophageal reflux disease without esophagitis: Secondary | ICD-10-CM

## 2012-06-29 DIAGNOSIS — B182 Chronic viral hepatitis C: Secondary | ICD-10-CM

## 2012-06-29 LAB — HEMOGLOBIN A1C: Hgb A1c MFr Bld: 10.5 % — ABNORMAL HIGH (ref 4.6–6.5)

## 2012-06-29 MED ORDER — AMPHETAMINE-DEXTROAMPHETAMINE 20 MG PO TABS: 20.0000 mg | ORAL_TABLET | Freq: Three times a day (TID) | ORAL | Status: DC

## 2012-06-29 MED ORDER — AMPHETAMINE-DEXTROAMPHET ER 30 MG PO CP24: 30.0000 mg | ORAL_CAPSULE | ORAL | Status: DC

## 2012-06-29 MED ORDER — INSULIN ASPART 100 UNIT/ML FLEXPEN: PEN_INJECTOR | SUBCUTANEOUS | Status: DC

## 2012-06-29 MED ORDER — INSULIN ISOPHANE HUMAN 100 UNIT/ML KWIKPEN: 35.0000 [IU] | PEN_INJECTOR | Freq: Every day | SUBCUTANEOUS | Status: DC

## 2012-06-29 MED ORDER — LIRAGLUTIDE 18 MG/3ML ~~LOC~~ SOPN: 1.8000 mg | PEN_INJECTOR | Freq: Every day | SUBCUTANEOUS | Status: DC

## 2012-06-29 NOTE — Progress Notes (Signed)
Subjective:    Patient ID: Michael Li, male    DOB: [birth year], 57 y.o.   MRN: [id number]  HPI  57 year old patient who has type 2 diabetes. This has not been well-controlled. Presently he is on Humulin N. 35 units at bedtime and Humulin R. 22 units at breakfast and lunch and 20 units prior to dinner. No recent hemoglobin A 1C. He has been off of Victoza; his insurance company required  [place] which he did not tolerate well and did not result in significant improvement of his hyperglycemia. He is requesting a preauthorization to resume Victoza. He is also self discontinued metformin due to his history of chronic hepatitis C and concerns about liver issues. He has ADHD and required medication refills. He has BPH and a history of GERD  Past Medical History  Diagnosis Date  . ATTENTION DEFICIT HYPERACTIVITY DISORDER 07/19/2006  . BENIGN PROSTATIC HYPERTROPHY 07/19/2006  . DIABETES MELLITUS, TYPE II 07/19/2006  . ERECTILE DYSFUNCTION 07/19/2006  . GERD 07/19/2006  . HEPATITIS C, CHRONIC 04/03/2007  . NEOPLASM, MALIGNANT, CARCINOMA, BASAL CELL, NOSE 10/20/2009    History   Social History  . Marital Status: Divorced    Spouse Name: N/A    Number of Children: 2  . Years of Education: N/A   Occupational History  . Cytogeneticist    Social History Main Topics  . Smoking status: Former Games developer  . Smokeless tobacco: Former Neurosurgeon    Quit date: 02/15/2007  . Alcohol Use: Not on file  . Drug Use: Not on file  . Sexually Active: Not on file   Other Topics Concern  . Not on file   Social History Narrative   Regular exercise-yes    Past Surgical History  Procedure Laterality Date  . Appendectomy    . Tonsillectomy    . Carotid injury       R carotid injury with trach    Family History  Problem Relation Age of Onset  . Diabetes Maternal Aunt   . Cancer Paternal Grandmother     colon cancer  . Diabetes Maternal Aunt     Allergies  Allergen Reactions  . Cephalexin    REACTION: unspecified    Current Outpatient Prescriptions on File Prior to Visit  Medication Sig Dispense Refill  . aspirin 81 MG tablet Take 81 mg by mouth daily.        Marland Kitchen CIALIS 20 MG tablet TAKE 1 TABLET BY MOUTH DAILY AS NEEDED  10 tablet  1  . glucose blood (FREESTYLE LITE) test strip 1 each by Other route daily. Dx 250.00  100 each  5  . insulin NPH (HUMULIN N) 100 UNIT/ML injection Inject 35 Units into the skin at bedtime.  6 vial  0  . Insulin Pen Needle (BD PEN NEEDLE NANO U/F) 32G X 4 MM MISC 1 each by Does not apply route daily.  100 each  1  . insulin regular (HUMULIN R) 100 units/mL injection Inject subcutaneously (just before each meal) 22-22-20 units  60 mL  0  . Insulin Syringe-Needle U-100 (INSULIN SYRINGE .5CC/31GX5/16") 31G X 5/16" 0.5 ML MISC Inject 1 each as directed daily.  100 each  3  . Lancets (FREESTYLE) lancets 1 each by Other route daily. Dx 250.00  100 each  5  . Liraglutide (VICTOZA) 18 MG/3ML SOLN Inject 0.2 mLs (1.2 mg total) as directed daily.  45 mL  1  . metFORMIN (GLUCOPHAGE) 1000 MG tablet Take 1 tablet (1,000 mg total) by  mouth 2 (two) times daily with a meal.  180 tablet  3  . omeprazole (PRILOSEC) 40 MG capsule Take 1 capsule (40 mg total) by mouth daily.  90 capsule  3   No current facility-administered medications on file prior to visit.    BP 140/80  Pulse 94  Temp(Src) 98.5 F (36.9 C) (Oral)  Resp 20  Wt 195 lb (88.451 kg)  BMI 28.78 kg/m2  SpO2 98%       Review of Systems  Constitutional: Negative for fever, chills, appetite change and fatigue.  HENT: Negative for hearing loss, ear pain, congestion, sore throat, trouble swallowing, neck stiffness, dental problem, voice change and tinnitus.   Eyes: Negative for pain, discharge and visual disturbance.  Respiratory: Negative for cough, chest tightness, wheezing and stridor.   Cardiovascular: Negative for chest pain, palpitations and leg swelling.  Gastrointestinal: Negative for  nausea, vomiting, abdominal pain, diarrhea, constipation, blood in stool and abdominal distention.  Genitourinary: Negative for urgency, hematuria, flank pain, discharge, difficulty urinating and genital sores.  Musculoskeletal: Negative for myalgias, back pain, joint swelling, arthralgias and gait problem.  Skin: Negative for rash.  Neurological: Negative for dizziness, syncope, speech difficulty, weakness, numbness and headaches.  Hematological: Negative for adenopathy. Does not bruise/bleed easily.  Psychiatric/Behavioral: Negative for behavioral problems and dysphoric mood. The patient is not nervous/anxious.        Objective:   Physical Exam  Constitutional: He is oriented to person, place, and time. He appears well-developed.  HENT:  Head: Normocephalic.  Right Ear: External ear normal.  Left Ear: External ear normal.  Eyes: Conjunctivae and EOM are normal.  Neck: Normal range of motion.  Cardiovascular: Normal rate and normal heart sounds.   Pulmonary/Chest: Breath sounds normal.  Abdominal: Bowel sounds are normal.  Musculoskeletal: Normal range of motion. He exhibits no edema and no tenderness.  Neurological: He is alert and oriented to person, place, and time.  Psychiatric: He has a normal mood and affect. His behavior is normal.          Assessment & Plan:  Diabetes mellitus. Poor control. We'll resume all medications new prescriptions refilled. Compliance stressed Chronic hepatitis C. We'll schedule CPX in 3 months. May need referral to hepatitis clinic ADHD stable medications refilled GERD stable. Continue Prilosec

## 2012-06-29 NOTE — Patient Instructions (Signed)
Please check your hemoglobin A1c every 3 months    It is important that you exercise regularly, at least 20 minutes 3 to 4 times per week.  If you develop chest pain or shortness of breath seek  medical attention.   

## 2012-07-06 ENCOUNTER — Telehealth: Payer: Self-pay | Admitting: Internal Medicine

## 2012-07-06 NOTE — Telephone Encounter (Signed)
Pharmacy tech called to see if they could dispense a generic version of the Victoza. Please assist.

## 2012-07-06 NOTE — Telephone Encounter (Signed)
Called [facility] and spoke to [place] told her no generic for Victoza already have approved PA, please dispense Victoza. Aram Beecham verbalized understanding.

## 2012-07-11 ENCOUNTER — Telehealth: Payer: Self-pay | Admitting: *Deleted

## 2012-07-11 MED ORDER — INSULIN ASPART 100 UNIT/ML FLEXPEN: PEN_INJECTOR | SUBCUTANEOUS | Status: DC

## 2012-07-11 MED ORDER — INSULIN PEN NEEDLE 32G X 4 MM MISC: 1.0000 | Freq: Every day | Status: DC

## 2012-07-11 MED ORDER — INSULIN ISOPHANE HUMAN 100 UNIT/ML KWIKPEN: 35.0000 [IU] | PEN_INJECTOR | Freq: Every day | SUBCUTANEOUS | Status: DC

## 2012-07-11 NOTE — Telephone Encounter (Signed)
Received voicemail from pt that Rx's were wrong needed 3 month supply.   Left message on voicemail to call office and that Rx's were called in for 3 month supply with refills and needles.

## 2012-08-07 ENCOUNTER — Ambulatory Visit (INDEPENDENT_AMBULATORY_CARE_PROVIDER_SITE_OTHER): Payer: 59 | Admitting: Internal Medicine

## 2012-08-07 ENCOUNTER — Encounter: Payer: Self-pay | Admitting: Internal Medicine

## 2012-08-07 VITALS — BP 130/80 | HR 83 | Temp 98.2°F | Resp 20 | Wt 194.0 lb

## 2012-08-07 DIAGNOSIS — F909 Attention-deficit hyperactivity disorder, unspecified type: Secondary | ICD-10-CM

## 2012-08-07 DIAGNOSIS — E119 Type 2 diabetes mellitus without complications: Secondary | ICD-10-CM

## 2012-08-07 DIAGNOSIS — M549 Dorsalgia, unspecified: Secondary | ICD-10-CM

## 2012-08-07 MED ORDER — PREDNISONE 10 MG PO TABS: 10.0000 mg | ORAL_TABLET | Freq: Two times a day (BID) | ORAL | Status: DC

## 2012-08-07 MED ORDER — HYDROCODONE-ACETAMINOPHEN 5-300 MG PO TABS: 1.0000 | ORAL_TABLET | Freq: Four times a day (QID) | ORAL | Status: DC | PRN

## 2012-08-07 MED ORDER — CYCLOBENZAPRINE HCL 5 MG PO TABS: 5.0000 mg | ORAL_TABLET | Freq: Three times a day (TID) | ORAL | Status: DC | PRN

## 2012-08-07 NOTE — Patient Instructions (Signed)
Most patients with low back pain will improve with time over the next two to 6 weeks.  Keep active but avoid any activities that cause pain.  Apply moist heat to the low back area several times daily.  Back Pain, Adult Low back pain is very common. About 1 in 5 people have back pain.The cause of low back pain is rarely dangerous. The pain often gets better over time.About half of people with a sudden onset of back pain feel better in just 2 weeks. About 8 in 10 people feel better by 6 weeks.  CAUSES Some common causes of back pain include:  Strain of the muscles or ligaments supporting the spine.  Wear and tear (degeneration) of the spinal discs.  Arthritis.  Direct injury to the back. DIAGNOSIS Most of the time, the direct cause of low back pain is not known.However, back pain can be treated effectively even when the exact cause of the pain is unknown.Answering your caregiver's questions about your overall health and symptoms is one of the most accurate ways to make sure the cause of your pain is not dangerous. If your caregiver needs more information, he or she may order lab work or imaging tests (X-rays or MRIs).However, even if imaging tests show changes in your back, this usually does not require surgery. HOME CARE INSTRUCTIONS For many people, back pain returns.Since low back pain is rarely dangerous, it is often a condition that people can learn to [facility] their own.   Remain active. It is stressful on the back to sit or stand in one place. Do not sit, drive, or stand in one place for more than 30 minutes at a time. Take short walks on level surfaces as soon as pain allows.Try to increase the length of time you walk each day.  Do not stay in bed.Resting more than 1 or 2 days can delay your recovery.  Do not avoid exercise or work.Your body is made to move.It is not dangerous to be active, even though your back may hurt.Your back will likely heal faster if you return to  being active before your pain is gone.  Pay attention to your body when you bend and lift. Many people have less discomfortwhen lifting if they bend their knees, keep the load close to their bodies,and avoid twisting. Often, the most comfortable positions are those that put less stress on your recovering back.  Find a comfortable position to sleep. Use a firm mattress and lie on your side with your knees slightly bent. If you lie on your back, put a pillow under your knees.  Only take over-the-counter or prescription medicines as directed by your caregiver. Over-the-counter medicines to reduce pain and inflammation are often the most helpful.Your caregiver may prescribe muscle relaxant drugs.These medicines help dull your pain so you can more quickly return to your normal activities and healthy exercise.  Put ice on the injured area.  Put ice in a plastic bag.  Place a towel between your skin and the bag.  Leave the ice on for 15-20 minutes, 3-4 times a day for the first 2 to 3 days. After that, ice and heat may be alternated to reduce pain and spasms.  Ask your caregiver about trying back exercises and gentle massage. This may be of some benefit.  Avoid feeling anxious or stressed.Stress increases muscle tension and can worsen back pain.It is important to recognize when you are anxious or stressed and learn ways to manage it.Exercise is a great option.  SEEK MEDICAL CARE IF:  You have pain that is not relieved with rest or medicine.  You have pain that does not improve in 1 week.  You have new symptoms.  You are generally not feeling well. SEEK IMMEDIATE MEDICAL CARE IF:   You have pain that radiates from your back into your legs.  You develop new bowel or bladder control problems.  You have unusual weakness or numbness in your arms or legs.  You develop nausea or vomiting.  You develop abdominal pain.  You feel faint. Document Released: 01/31/2005 Document Revised:  08/02/2011 Document Reviewed: 06/21/2010 [facility] Patient Information 2014 [place], [place]. Back Injury Prevention Back injuries can be extremely painful and difficult to heal. After having one back injury, you are much more likely to experience another later on. It is important to learn how to avoid injuring or re-injuring your back. The following tips can help you to prevent a back injury. PHYSICAL FITNESS  Exercise regularly and try to develop good tone in your abdominal muscles. Your abdominal muscles provide a lot of the support needed by your back.  Do aerobic exercises (walking, jogging, biking, swimming) regularly.  Do exercises that increase balance and strength (tai chi, yoga) regularly. This can decrease your risk of falling and injuring your back.  Stretch before and after exercising.  Maintain a healthy weight. The more you weigh, the more stress is placed on your back. For every pound of weight, 10 times that amount of pressure is placed on the back. DIET  Talk to your caregiver about how much calcium and vitamin D you need per day. These nutrients help to prevent weakening of the bones (osteoporosis). Osteoporosis can cause broken (fractured) bones that lead to back pain.  Include good sources of calcium in your diet, such as dairy products, green, leafy vegetables, and products with calcium added (fortified).  Include good sources of vitamin D in your diet, such as milk and foods that are fortified with vitamin D.  Consider taking a nutritional supplement or a multivitamin if needed.  Stop smoking if you smoke. POSTURE  Sit and stand up straight. Avoid leaning forward when you sit or hunching over when you stand.  Choose chairs with good low back (lumbar) support.  If you work at a desk, sit close to your work so you do not need to lean over. Keep your chin tucked in. Keep your neck drawn back and elbows bent at a right angle. Your arms should look like the letter  "L."  Sit high and close to the steering wheel when you drive. Add a lumbar support to your car seat if needed.  Avoid sitting or standing in one position for too long. Take breaks to get up, stretch, and walk around at least once every hour. Take breaks if you are driving for long periods of time.  Sleep on your side with your knees slightly bent, or sleep on your back with a pillow under your knees. Do not sleep on your stomach. LIFTING, TWISTING, AND REACHING  Avoid heavy lifting, especially repetitive lifting. If you must do heavy lifting:  Stretch before lifting.  Work slowly.  Rest between lifts.  Use carts and dollies to move objects when possible.  Make several small trips instead of carrying 1 heavy load.  Ask for help when you need it.  Ask for help when moving big, awkward objects.  Follow these steps when lifting:  Stand with your feet shoulder-width apart.  Get as close  to the object as you can. Do not try to pick up heavy objects that are far from your body.  Use handles or lifting straps if they are available.  Bend at your knees. Squat down, but keep your heels off the floor.  Keep your shoulders pulled back, your chin tucked in, and your back straight.  Lift the object slowly, tightening the muscles in your legs, abdomen, and buttocks. Keep the object as close to the center of your body as possible.  When you put a load down, use these same guidelines in reverse.  Do not:  Lift the object above your waist.  Twist at the waist while lifting or carrying a load. Move your feet if you need to turn, not your waist.  Bend over without bending at your knees.  Avoid reaching over your head, across a table, or for an object on a high surface. OTHER TIPS  Avoid wet floors and keep sidewalks clear of ice to prevent falls.  Do not sleep on a mattress that is too soft or too hard.  Keep items that are used frequently within easy reach.  Put heavier  objects on shelves at waist level and lighter objects on lower or higher shelves.  Find ways to decrease your stress, such as exercise, massage, or relaxation techniques. Stress can build up in your muscles. Tense muscles are more vulnerable to injury.  Seek treatment for depression or anxiety if needed. These conditions can increase your risk of developing back pain. SEEK MEDICAL CARE IF:  You injure your back.  You have questions about diet, exercise, or other ways to prevent back injuries. MAKE SURE YOU:  Understand these instructions.  Will watch your condition.  Will get help right away if you are not doing well or get worse. Document Released: 03/10/2004 Document Revised: 04/25/2011 Document Reviewed: 03/14/2011 [facility] Patient Information 2014 [place], [place].

## 2012-08-07 NOTE — Progress Notes (Signed)
Subjective:    Patient ID: Michael Li, male    DOB: 1955/08/25, 57 y.o.   MRN: [id number]  HPI  57 year old patient who presents with a several-day history of right lumbar back pain. This is aggravated by movement and there is some radiation to the right thigh area. He had very similar symptom complex in August of last year but improved with treatment.  No motor weakness  He has diabetes which has improved since resuming Victoza  Past Medical History  Diagnosis Date  . ATTENTION DEFICIT HYPERACTIVITY DISORDER 07/19/2006  . BENIGN PROSTATIC HYPERTROPHY 07/19/2006  . DIABETES MELLITUS, TYPE II 07/19/2006  . ERECTILE DYSFUNCTION 07/19/2006  . GERD 07/19/2006  . HEPATITIS C, CHRONIC 04/03/2007  . NEOPLASM, MALIGNANT, CARCINOMA, BASAL CELL, NOSE 10/20/2009    History   Social History  . Marital Status: Divorced    Spouse Name: N/A    Number of Children: 2  . Years of Education: N/A   Occupational History  . Cytogeneticist    Social History Main Topics  . Smoking status: Former Games developer  . Smokeless tobacco: Former Neurosurgeon    Quit date: 02/15/2007  . Alcohol Use: Not on file  . Drug Use: Not on file  . Sexually Active: Not on file   Other Topics Concern  . Not on file   Social History Narrative   Regular exercise-yes    Past Surgical History  Procedure Laterality Date  . Appendectomy    . Tonsillectomy    . Carotid injury       R carotid injury with trach    Family History  Problem Relation Age of Onset  . Diabetes Maternal Aunt   . Cancer Paternal Grandmother     colon cancer  . Diabetes Maternal Aunt     Allergies  Allergen Reactions  . Cephalexin     REACTION: unspecified    Current Outpatient Prescriptions on File Prior to Visit  Medication Sig Dispense Refill  . amphetamine-dextroamphetamine (ADDERALL XR) 30 MG 24 hr capsule Take 1 capsule (30 mg total) by mouth every morning.  30 capsule  0  . amphetamine-dextroamphetamine (ADDERALL XR) 30 MG 24 hr  capsule Take 1 capsule (30 mg total) by mouth every morning.  30 capsule  0  . amphetamine-dextroamphetamine (ADDERALL XR) 30 MG 24 hr capsule Take 1 capsule (30 mg total) by mouth every morning.  30 capsule  0  . amphetamine-dextroamphetamine (ADDERALL) 20 MG tablet Take 1 tablet (20 mg total) by mouth 3 (three) times daily.  90 tablet  0  . amphetamine-dextroamphetamine (ADDERALL) 20 MG tablet Take 1 tablet (20 mg total) by mouth 3 (three) times daily.  90 tablet  0  . amphetamine-dextroamphetamine (ADDERALL) 20 MG tablet Take 1 tablet (20 mg total) by mouth 3 (three) times daily.  90 tablet  0  . aspirin 81 MG tablet Take 81 mg by mouth daily.        Marland Kitchen CIALIS 20 MG tablet TAKE 1 TABLET BY MOUTH DAILY AS NEEDED  10 tablet  1  . glucose blood (FREESTYLE LITE) test strip 1 each by Other route daily. Dx 250.00  100 each  5  . insulin aspart (NOVOLOG FLEXPEN) 100 unit/mL SOLN FlexPen Inject subcutaneously (just before each meal) 22-22-20 units  25 pen  3  . Insulin Isophane Human (HUMULIN N KWIKPEN) 100 UNIT/ML SUPN Inject 35 Units into the skin at bedtime.  15 pen  3  . Insulin Pen Needle (BD PEN NEEDLE NANO  U/F) 32G X 4 MM MISC 1 each by Does not apply route daily.  100 each  5  . Insulin Syringe-Needle U-100 (INSULIN SYRINGE .5CC/31GX5/16") 31G X 5/16" 0.5 ML MISC Inject 1 each as directed daily.  100 each  3  . Lancets (FREESTYLE) lancets 1 each by Other route daily. Dx 250.00  100 each  5  . Liraglutide (VICTOZA) 18 MG/3ML SOPN Inject 1.8 mg into the skin daily.  5 pen  5  . metFORMIN (GLUCOPHAGE) 1000 MG tablet Take 1 tablet (1,000 mg total) by mouth 2 (two) times daily with a meal.  180 tablet  3  . omeprazole (PRILOSEC) 40 MG capsule Take 1 capsule (40 mg total) by mouth daily.  90 capsule  3   No current facility-administered medications on file prior to visit.    BP 130/80  Pulse 83  Temp(Src) 98.2 F (36.8 C) (Oral)  Resp 20  Wt 194 lb (87.998 kg)  BMI 28.64 kg/m2  SpO2  98%       Review of Systems  Constitutional: Negative for fever, chills, appetite change and fatigue.  HENT: Negative for hearing loss, ear pain, congestion, sore throat, trouble swallowing, neck stiffness, dental problem, voice change and tinnitus.   Eyes: Negative for pain, discharge and visual disturbance.  Respiratory: Negative for cough, chest tightness, wheezing and stridor.   Cardiovascular: Negative for chest pain, palpitations and leg swelling.  Gastrointestinal: Negative for nausea, vomiting, abdominal pain, diarrhea, constipation, blood in stool and abdominal distention.  Genitourinary: Negative for urgency, hematuria, flank pain, discharge, difficulty urinating and genital sores.  Musculoskeletal: Positive for back pain. Negative for myalgias, joint swelling, arthralgias and gait problem.  Skin: Negative for rash.  Neurological: Negative for dizziness, syncope, speech difficulty, weakness, numbness and headaches.  Hematological: Negative for adenopathy. Does not bruise/bleed easily.  Psychiatric/Behavioral: Negative for behavioral problems and dysphoric mood. The patient is not nervous/anxious.        Objective:   Physical Exam  Constitutional: He appears well-developed and well-nourished. No distress.  Musculoskeletal: Normal range of motion. He exhibits no edema and no tenderness.  Straight leg test on the right did tend to reproduce his right lumbar discomfort Reflexes intact Normal foot flexion and extension          Assessment & Plan:   Low back pain. Will treat with a short course of oral prednisone and most relaxers and analgesics Diabetes improved

## 2012-08-08 ENCOUNTER — Ambulatory Visit: Payer: 59 | Admitting: Internal Medicine

## 2012-08-15 ENCOUNTER — Encounter: Payer: Self-pay | Admitting: Internal Medicine

## 2012-08-15 ENCOUNTER — Ambulatory Visit (INDEPENDENT_AMBULATORY_CARE_PROVIDER_SITE_OTHER): Payer: 59 | Admitting: Internal Medicine

## 2012-08-15 VITALS — BP 124/80 | Temp 97.9°F | Wt 195.0 lb

## 2012-08-15 DIAGNOSIS — M545 Low back pain, unspecified: Secondary | ICD-10-CM

## 2012-08-15 MED ORDER — METHYLPREDNISOLONE ACETATE 80 MG/ML IJ SUSP
80.0000 mg | Freq: Once | INTRAMUSCULAR | Status: AC
  Administered 2012-08-15: 80 mg via INTRAMUSCULAR

## 2012-08-15 NOTE — Patient Instructions (Signed)
Lumbar MRI as discussed  Call or return to clinic prn if these symptoms worsen or fail to improve as anticipated.

## 2012-08-15 NOTE — Progress Notes (Signed)
Subjective:    Patient ID: Michael Li, male    DOB: May 18, 1955, 57 y.o.   MRN: [id number]  HPI  57 year old patient who is seen today for followup of right lumbar pain. Pain has not improved. Pain radiates to the right anteromedial thigh area. No motor weakness.  Past Medical History  Diagnosis Date  . ATTENTION DEFICIT HYPERACTIVITY DISORDER 07/19/2006  . BENIGN PROSTATIC HYPERTROPHY 07/19/2006  . DIABETES MELLITUS, TYPE II 07/19/2006  . ERECTILE DYSFUNCTION 07/19/2006  . GERD 07/19/2006  . HEPATITIS C, CHRONIC 04/03/2007  . NEOPLASM, MALIGNANT, CARCINOMA, BASAL CELL, NOSE 10/20/2009    History   Social History  . Marital Status: Divorced    Spouse Name: N/A    Number of Children: 2  . Years of Education: N/A   Occupational History  . Cytogeneticist    Social History Main Topics  . Smoking status: Former Games developer  . Smokeless tobacco: Former Neurosurgeon    Quit date: 02/15/2007  . Alcohol Use: Not on file  . Drug Use: Not on file  . Sexually Active: Not on file   Other Topics Concern  . Not on file   Social History Narrative   Regular exercise-yes    Past Surgical History  Procedure Laterality Date  . Appendectomy    . Tonsillectomy    . Carotid injury       R carotid injury with trach    Family History  Problem Relation Age of Onset  . Diabetes Maternal Aunt   . Cancer Paternal Grandmother     colon cancer  . Diabetes Maternal Aunt     Allergies  Allergen Reactions  . Cephalexin     REACTION: unspecified    Current Outpatient Prescriptions on File Prior to Visit  Medication Sig Dispense Refill  . amphetamine-dextroamphetamine (ADDERALL XR) 30 MG 24 hr capsule Take 1 capsule (30 mg total) by mouth every morning.  30 capsule  0  . amphetamine-dextroamphetamine (ADDERALL XR) 30 MG 24 hr capsule Take 1 capsule (30 mg total) by mouth every morning.  30 capsule  0  . amphetamine-dextroamphetamine (ADDERALL XR) 30 MG 24 hr capsule Take 1 capsule (30 mg total) by  mouth every morning.  30 capsule  0  . amphetamine-dextroamphetamine (ADDERALL) 20 MG tablet Take 1 tablet (20 mg total) by mouth 3 (three) times daily.  90 tablet  0  . amphetamine-dextroamphetamine (ADDERALL) 20 MG tablet Take 1 tablet (20 mg total) by mouth 3 (three) times daily.  90 tablet  0  . amphetamine-dextroamphetamine (ADDERALL) 20 MG tablet Take 1 tablet (20 mg total) by mouth 3 (three) times daily.  90 tablet  0  . aspirin 81 MG tablet Take 81 mg by mouth daily.        Marland Kitchen CIALIS 20 MG tablet TAKE 1 TABLET BY MOUTH DAILY AS NEEDED  10 tablet  1  . cyclobenzaprine (FLEXERIL) 5 MG tablet Take 1 tablet (5 mg total) by mouth 3 (three) times daily as needed for muscle spasms.  30 tablet  1  . glucose blood (FREESTYLE LITE) test strip 1 each by Other route daily. Dx 250.00  100 each  5  . Hydrocodone-Acetaminophen 5-300 MG TABS Take 1 tablet by mouth every 6 (six) hours as needed.  40 each  0  . insulin aspart (NOVOLOG FLEXPEN) 100 unit/mL SOLN FlexPen Inject subcutaneously (just before each meal) 22-22-20 units  25 pen  3  . Insulin Isophane Human (HUMULIN N KWIKPEN) 100 UNIT/ML SUPN Inject  35 Units into the skin at bedtime.  15 pen  3  . Insulin Pen Needle (BD PEN NEEDLE NANO U/F) 32G X 4 MM MISC 1 each by Does not apply route daily.  100 each  5  . Insulin Syringe-Needle U-100 (INSULIN SYRINGE .5CC/31GX5/16") 31G X 5/16" 0.5 ML MISC Inject 1 each as directed daily.  100 each  3  . Lancets (FREESTYLE) lancets 1 each by Other route daily. Dx 250.00  100 each  5  . Liraglutide (VICTOZA) 18 MG/3ML SOPN Inject 1.8 mg into the skin daily.  5 pen  5  . metFORMIN (GLUCOPHAGE) 1000 MG tablet Take 1 tablet (1,000 mg total) by mouth 2 (two) times daily with a meal.  180 tablet  3  . omeprazole (PRILOSEC) 40 MG capsule Take 1 capsule (40 mg total) by mouth daily.  90 capsule  3   No current facility-administered medications on file prior to visit.    BP 124/80  Temp(Src) 97.9 F (36.6 C) (Oral)   Wt 195 lb (88.451 kg)  BMI 28.78 kg/m2       Review of Systems  Constitutional: Negative for fever, chills, appetite change and fatigue.  HENT: Negative for hearing loss, ear pain, congestion, sore throat, trouble swallowing, neck stiffness, dental problem, voice change and tinnitus.   Eyes: Negative for pain, discharge and visual disturbance.  Respiratory: Negative for cough, chest tightness, wheezing and stridor.   Cardiovascular: Negative for chest pain, palpitations and leg swelling.  Gastrointestinal: Negative for nausea, vomiting, abdominal pain, diarrhea, constipation, blood in stool and abdominal distention.  Genitourinary: Negative for urgency, hematuria, flank pain, discharge, difficulty urinating and genital sores.  Musculoskeletal: Positive for back pain. Negative for myalgias, joint swelling, arthralgias and gait problem.  Skin: Negative for rash.  Neurological: Negative for dizziness, syncope, speech difficulty, weakness, numbness and headaches.  Hematological: Negative for adenopathy. Does not bruise/bleed easily.  Psychiatric/Behavioral: Negative for behavioral problems and dysphoric mood. The patient is not nervous/anxious.        Objective:   Physical Exam  Constitutional: He appears well-developed and well-nourished. No distress.  Musculoskeletal:  Straight leg test on the right does tend aggravate his right lumbar pain with radiation to the right anterior and medial thigh area  Able to walk on his toes and heels without difficulty  Reflexes intact          Assessment & Plan:   Persistent right lumbar pain with  Radiculopathy (?L4). We'll continue symptomatic treatment. We'll set up for lumbar MRI; will treat with Depo-Medrol

## 2012-08-22 ENCOUNTER — Ambulatory Visit
Admission: RE | Admit: 2012-08-22 | Discharge: 2012-08-22 | Disposition: A | Discharge status: Home / Self Care | Payer: 59 | Source: Ambulatory Visit | Attending: Internal Medicine | Admitting: Internal Medicine

## 2012-08-22 DIAGNOSIS — M545 Low back pain, unspecified: Secondary | ICD-10-CM

## 2012-08-24 ENCOUNTER — Telehealth: Payer: Self-pay | Admitting: Internal Medicine

## 2012-08-24 NOTE — Telephone Encounter (Signed)
See results message 

## 2012-08-24 NOTE — Telephone Encounter (Signed)
Pt would like MRI results. Pt had MRI on 08-22-12. Pt is aware MD out of office today

## 2012-08-27 ENCOUNTER — Other Ambulatory Visit: Payer: Self-pay | Admitting: Internal Medicine

## 2012-08-27 DIAGNOSIS — IMO0002 Reserved for concepts with insufficient information to code with codable children: Secondary | ICD-10-CM

## 2012-09-06 ENCOUNTER — Other Ambulatory Visit: Payer: Self-pay | Admitting: Neurosurgery

## 2012-09-07 ENCOUNTER — Encounter (HOSPITAL_COMMUNITY): Payer: Self-pay | Admitting: Pharmacy Technician

## 2012-09-07 NOTE — Pre-Procedure Instructions (Signed)
Michael Li  09/07/2012   Your procedure is scheduled on: Wednesday, September 12, 2012  Report to [facility] at 6:30AM.  Call this number if you have problems the morning of surgery: [phone]   Remember:   Do not eat food or drink liquids after midnight.   Take these medicines the morning of surgery with A SIP OF WATER:  omeprazole (PRILOSEC) 40 MG capsule  Stop taking Aspirin and herbal medications. Do not take any NSAIDs ie: Ibuprofen, Advil, Naproxen or any medication containing Aspirin.  Do not wear jewelry  Do not wear lotions, powders, or perfumes. You may wear deodorant.            Men may shave face and neck.  Do not bring valuables to the hospital.  [facility] is not responsible for any belongings or valuables.  Contacts, dentures or bridgework may not be worn into surgery.  Leave suitcase in the car. After surgery it may be brought to your room.  For patients admitted to the hospital, checkout time is 11:00 AM the day of discharge.   Patients discharged the day of surgery will not be allowed to drive home.  Name and phone number of your driver:   Special Instructions: Shower using CHG 2 nights before surgery and the night before surgery.  If you shower the day of surgery use CHG.  Use special wash - you have one bottle of CHG for all showers.  You should use approximately 1/3 of the bottle for each shower.   Please read over the following fact sheets that you were given: Pain Booklet, Coughing and Deep Breathing, MRSA Information and Surgical Site Infection Prevention

## 2012-09-10 ENCOUNTER — Encounter (HOSPITAL_COMMUNITY): Payer: Self-pay

## 2012-09-10 ENCOUNTER — Encounter (HOSPITAL_COMMUNITY)
Admission: RE | Admit: 2012-09-10 | Discharge: 2012-09-10 | Disposition: A | Discharge status: Home / Self Care | Payer: 59 | Source: Ambulatory Visit | Attending: Neurosurgery | Admitting: Neurosurgery

## 2012-09-10 HISTORY — DX: Anemia, unspecified: D64.9

## 2012-09-10 LAB — CBC WITH DIFFERENTIAL/PLATELET
Basophils Absolute: 0 10*3/uL (ref 0.0–0.1)
Basophils Relative: 0 % (ref 0–1)
Eosinophils Absolute: 0.1 10*3/uL (ref 0.0–0.7)
Eosinophils Relative: 2 % (ref 0–5)
HCT: 48 % (ref 39.0–52.0)
Hemoglobin: 17.3 g/dL — ABNORMAL HIGH (ref 13.0–17.0)
Lymphocytes Relative: 32 % (ref 12–46)
Lymphs Abs: 2.2 10*3/uL (ref 0.7–4.0)
MCH: 31.2 pg (ref 26.0–34.0)
MCHC: 36 g/dL (ref 30.0–36.0)
MCV: 86.6 fL (ref 78.0–100.0)
Monocytes Absolute: 0.6 10*3/uL (ref 0.1–1.0)
Monocytes Relative: 9 % (ref 3–12)
Neutro Abs: 3.9 10*3/uL (ref 1.7–7.7)
Neutrophils Relative %: 58 % (ref 43–77)
Platelets: 148 10*3/uL — ABNORMAL LOW (ref 150–400)
RBC: 5.54 MIL/uL (ref 4.22–5.81)
RDW: 12.5 % (ref 11.5–15.5)
WBC: 6.8 10*3/uL (ref 4.0–10.5)

## 2012-09-10 LAB — COMPREHENSIVE METABOLIC PANEL
ALT: 185 U/L — ABNORMAL HIGH (ref 0–53)
AST: 98 U/L — ABNORMAL HIGH (ref 0–37)
Albumin: 4.1 g/dL (ref 3.5–5.2)
Alkaline Phosphatase: 76 U/L (ref 39–117)
BUN: 15 mg/dL (ref 6–23)
CO2: 28 mEq/L (ref 19–32)
Calcium: 10.1 mg/dL (ref 8.4–10.5)
Chloride: 100 mEq/L (ref 96–112)
Creatinine, Ser: 0.92 mg/dL (ref 0.50–1.35)
GFR calc Af Amer: >90 mL/min (ref >90)
GFR calc non Af Amer: >90 mL/min (ref >90)
Glucose, Bld: 138 mg/dL — ABNORMAL HIGH (ref 70–99)
Potassium: 4.5 mEq/L (ref 3.5–5.1)
Sodium: 138 mEq/L (ref 135–145)
Total Bilirubin: 0.7 mg/dL (ref 0.3–1.2)
Total Protein: 7.3 g/dL (ref 6.0–8.3)

## 2012-09-10 LAB — SURGICAL PCR SCREEN
MRSA, PCR: NEGATIVE
Staphylococcus aureus: POSITIVE — AB

## 2012-09-11 MED ORDER — VANCOMYCIN HCL IN DEXTROSE 1-5 GM/200ML-% IV SOLN
1000.0000 mg | INTRAVENOUS | Status: AC
  Administered 2012-09-12: 1000 mg via INTRAVENOUS
  Filled 2012-09-11: qty 200

## 2012-09-11 NOTE — Progress Notes (Signed)
Anesthesia Chart Review:  Patient is a 57 year old male scheduled for right L3-4 microdiskectomy on 09/12/12 by Dr. Jordan Likes.  History includes chronic hepatitis C, DM2, ADHD, ED, GERD, skin cancer (nose, BCC), anemia, former smoker, tonsillectomy with carotid injury with history of tracheostomy '81, right IHR, appendectomy.  PCP is Dr. Amador Cunas.  I called and spoke with patient.  He has had no recent GI/hepatology follow-up.  His last liver biopsy was in 2009 (ordered by Dr. Kinnie Scales) and showed histologic features of grade 2, stage 3-4 hepatitis.  EKG on 09/10/12 showed ST @ 102 bpm.  Preoperative labs noted.  Cr 0.92, glucose 138, AST 98, ALT 185. (Currently, the most recent comparison LFTs are from 09/2008 with an AST of 83 and ALT of 172 then.) H/H 17.3/48.0, PLT 148K.  Since AST/ALT are > 2X normal, I will order a PT/PTT for the day of surgery. However, since they appear chronically elevated and stable for the past four years, I will not plan to repeat a HFP.  Reviewed labs with anesthesiologist Dr. Jacklynn Bue.  [facility]/Anesthesiology Phone [phone] 09/11/2012 11:29 AM

## 2012-09-12 ENCOUNTER — Ambulatory Visit (HOSPITAL_COMMUNITY)
Admission: RE | Admit: 2012-09-12 | Discharge: 2012-09-12 | Disposition: A | Discharge status: Home / Self Care | Payer: 59 | Source: Ambulatory Visit | Attending: Neurosurgery | Admitting: Neurosurgery

## 2012-09-12 ENCOUNTER — Ambulatory Visit (HOSPITAL_COMMUNITY): Payer: 59 | Admitting: Anesthesiology

## 2012-09-12 ENCOUNTER — Ambulatory Visit (HOSPITAL_COMMUNITY): Payer: 59

## 2012-09-12 ENCOUNTER — Encounter (HOSPITAL_COMMUNITY): Payer: Self-pay | Admitting: Anesthesiology

## 2012-09-12 ENCOUNTER — Encounter (HOSPITAL_COMMUNITY)
Admission: RE | Disposition: A | Discharge status: Home / Self Care | Payer: Self-pay | Source: Ambulatory Visit | Attending: Neurosurgery

## 2012-09-12 ENCOUNTER — Encounter (HOSPITAL_COMMUNITY): Payer: Self-pay | Admitting: Vascular Surgery

## 2012-09-12 DIAGNOSIS — E119 Type 2 diabetes mellitus without complications: Secondary | ICD-10-CM | POA: Insufficient documentation

## 2012-09-12 DIAGNOSIS — Z794 Long term (current) use of insulin: Secondary | ICD-10-CM | POA: Insufficient documentation

## 2012-09-12 DIAGNOSIS — Z79899 Other long term (current) drug therapy: Secondary | ICD-10-CM | POA: Insufficient documentation

## 2012-09-12 DIAGNOSIS — Z01812 Encounter for preprocedural laboratory examination: Secondary | ICD-10-CM | POA: Insufficient documentation

## 2012-09-12 DIAGNOSIS — M5126 Other intervertebral disc displacement, lumbar region: Secondary | ICD-10-CM | POA: Diagnosis present

## 2012-09-12 DIAGNOSIS — Z01810 Encounter for preprocedural cardiovascular examination: Secondary | ICD-10-CM | POA: Insufficient documentation

## 2012-09-12 HISTORY — PX: LUMBAR LAMINECTOMY/DECOMPRESSION MICRODISCECTOMY: SHX5026

## 2012-09-12 LAB — GLUCOSE, CAPILLARY
Glucose-Capillary: 134 mg/dL — ABNORMAL HIGH (ref 70–99)
Glucose-Capillary: 144 mg/dL — ABNORMAL HIGH (ref 70–99)
Glucose-Capillary: 191 mg/dL — ABNORMAL HIGH (ref 70–99)
Glucose-Capillary: 189 mg/dL — ABNORMAL HIGH (ref 70–99)

## 2012-09-12 LAB — APTT: aPTT: 27 seconds (ref 24–37)

## 2012-09-12 LAB — PROTIME-INR
INR: 0.98 (ref 0.00–1.49)
Prothrombin Time: 12.8 seconds (ref 11.6–15.2)

## 2012-09-12 SURGERY — LUMBAR LAMINECTOMY/DECOMPRESSION MICRODISCECTOMY 1 LEVEL
Anesthesia: General | Site: Back | Laterality: Right | Wound class: Clean

## 2012-09-12 MED ORDER — BUPIVACAINE HCL (PF) 0.5 % IJ SOLN
INTRAMUSCULAR | Status: DC | PRN
  Administered 2012-09-12: 20 mL

## 2012-09-12 MED ORDER — ASPIRIN 81 MG PO TABS: 81.0000 mg | ORAL_TABLET | Freq: Every day | ORAL | Status: DC

## 2012-09-12 MED ORDER — SODIUM CHLORIDE 0.9 % IV SOLN: 250.0000 mL | INTRAVENOUS | Status: DC

## 2012-09-12 MED ORDER — INSULIN ASPART 100 UNIT/ML ~~LOC~~ SOLN
0.0000 [IU] | Freq: Three times a day (TID) | SUBCUTANEOUS | Status: DC
  Administered 2012-09-12: 18:00:00 via SUBCUTANEOUS
  Administered 2012-09-12: 4 [IU] via SUBCUTANEOUS

## 2012-09-12 MED ORDER — HYDROCODONE-ACETAMINOPHEN 5-325 MG PO TABS: 1.0000 | ORAL_TABLET | ORAL | Status: DC | PRN

## 2012-09-12 MED ORDER — GLYCOPYRROLATE 0.2 MG/ML IJ SOLN
INTRAMUSCULAR | Status: DC | PRN
  Administered 2012-09-12: 0.4 mg via INTRAVENOUS

## 2012-09-12 MED ORDER — AMPHETAMINE-DEXTROAMPHETAMINE 10 MG PO TABS: 10.0000 mg | ORAL_TABLET | Freq: Three times a day (TID) | ORAL | Status: DC | PRN

## 2012-09-12 MED ORDER — ONDANSETRON HCL 4 MG/2ML IJ SOLN
INTRAMUSCULAR | Status: DC | PRN
  Administered 2012-09-12: 4 mg via INTRAVENOUS

## 2012-09-12 MED ORDER — ALUM & MAG HYDROXIDE-SIMETH 200-200-20 MG/5ML PO SUSP: 30.0000 mL | Freq: Four times a day (QID) | ORAL | Status: DC | PRN

## 2012-09-12 MED ORDER — LIDOCAINE HCL (CARDIAC) 20 MG/ML IV SOLN
INTRAVENOUS | Status: DC | PRN
  Administered 2012-09-12: 80 mg via INTRAVENOUS

## 2012-09-12 MED ORDER — AMPHETAMINE-DEXTROAMPHET ER 30 MG PO CP24: 30.0000 mg | ORAL_CAPSULE | Freq: Every day | ORAL | Status: DC

## 2012-09-12 MED ORDER — HYDROMORPHONE HCL PF 1 MG/ML IJ SOLN: 0.2500 mg | INTRAMUSCULAR | Status: DC | PRN

## 2012-09-12 MED ORDER — ACETAMINOPHEN 650 MG RE SUPP: 650.0000 mg | RECTAL | Status: DC | PRN

## 2012-09-12 MED ORDER — SENNA 8.6 MG PO TABS: 1.0000 | ORAL_TABLET | Freq: Two times a day (BID) | ORAL | Status: DC

## 2012-09-12 MED ORDER — ACETAMINOPHEN 325 MG PO TABS: 650.0000 mg | ORAL_TABLET | ORAL | Status: DC | PRN

## 2012-09-12 MED ORDER — LACTATED RINGERS IV SOLN
INTRAVENOUS | Status: DC | PRN
  Administered 2012-09-12 (×2): via INTRAVENOUS

## 2012-09-12 MED ORDER — CYCLOBENZAPRINE HCL 10 MG PO TABS: 10.0000 mg | ORAL_TABLET | Freq: Three times a day (TID) | ORAL | Status: DC | PRN

## 2012-09-12 MED ORDER — KETOROLAC TROMETHAMINE 30 MG/ML IJ SOLN
INTRAMUSCULAR | Status: AC
  Filled 2012-09-12: qty 1

## 2012-09-12 MED ORDER — PANTOPRAZOLE SODIUM 40 MG PO TBEC: 40.0000 mg | DELAYED_RELEASE_TABLET | Freq: Every day | ORAL | Status: DC

## 2012-09-12 MED ORDER — SODIUM CHLORIDE 0.9 % IJ SOLN: 3.0000 mL | INTRAMUSCULAR | Status: DC | PRN

## 2012-09-12 MED ORDER — ARTIFICIAL TEARS OP OINT
TOPICAL_OINTMENT | OPHTHALMIC | Status: DC | PRN
  Administered 2012-09-12: 1 via OPHTHALMIC

## 2012-09-12 MED ORDER — INSULIN NPH (HUMAN) (ISOPHANE) 100 UNIT/ML ~~LOC~~ SUSP
35.0000 [IU] | Freq: Every day | SUBCUTANEOUS | Status: DC
  Filled 2012-09-12: qty 10

## 2012-09-12 MED ORDER — INSULIN ISOPHANE HUMAN 100 UNIT/ML KWIKPEN: 35.0000 [IU] | PEN_INJECTOR | Freq: Every day | SUBCUTANEOUS | Status: DC

## 2012-09-12 MED ORDER — PROPOFOL 10 MG/ML IV BOLUS
INTRAVENOUS | Status: DC | PRN
  Administered 2012-09-12: 170 mg via INTRAVENOUS

## 2012-09-12 MED ORDER — ROCURONIUM BROMIDE 100 MG/10ML IV SOLN
INTRAVENOUS | Status: DC | PRN
  Administered 2012-09-12: 50 mg via INTRAVENOUS

## 2012-09-12 MED ORDER — SODIUM CHLORIDE 0.9 % IJ SOLN: 3.0000 mL | Freq: Two times a day (BID) | INTRAMUSCULAR | Status: DC

## 2012-09-12 MED ORDER — HEMOSTATIC AGENTS (NO CHARGE) OPTIME
TOPICAL | Status: DC | PRN
  Administered 2012-09-12: 1 via TOPICAL

## 2012-09-12 MED ORDER — SODIUM CHLORIDE 0.9 % IV SOLN
INTRAVENOUS | Status: AC
  Filled 2012-09-12: qty 500

## 2012-09-12 MED ORDER — METFORMIN HCL 500 MG PO TABS
1000.0000 mg | ORAL_TABLET | Freq: Two times a day (BID) | ORAL | Status: DC
  Administered 2012-09-12: 1000 mg via ORAL
  Filled 2012-09-12 (×2): qty 2

## 2012-09-12 MED ORDER — ASPIRIN 81 MG PO CHEW: 81.0000 mg | CHEWABLE_TABLET | Freq: Every day | ORAL | Status: DC

## 2012-09-12 MED ORDER — PHENYLEPHRINE HCL 10 MG/ML IJ SOLN
INTRAMUSCULAR | Status: DC | PRN
  Administered 2012-09-12 (×2): 80 ug via INTRAVENOUS

## 2012-09-12 MED ORDER — MIDAZOLAM HCL 5 MG/5ML IJ SOLN
INTRAMUSCULAR | Status: DC | PRN
  Administered 2012-09-12: 2 mg via INTRAVENOUS

## 2012-09-12 MED ORDER — KETOROLAC TROMETHAMINE 30 MG/ML IJ SOLN
30.0000 mg | Freq: Four times a day (QID) | INTRAMUSCULAR | Status: DC
  Administered 2012-09-12: 30 mg via INTRAVENOUS

## 2012-09-12 MED ORDER — OXYCODONE-ACETAMINOPHEN 5-325 MG PO TABS: 1.0000 | ORAL_TABLET | ORAL | Status: DC | PRN

## 2012-09-12 MED ORDER — ADULT MULTIVITAMIN W/MINERALS CH
1.0000 | ORAL_TABLET | Freq: Every day | ORAL | Status: DC
  Filled 2012-09-12: qty 1

## 2012-09-12 MED ORDER — MENTHOL 3 MG MT LOZG: 1.0000 | LOZENGE | OROMUCOSAL | Status: DC | PRN

## 2012-09-12 MED ORDER — PHENOL 1.4 % MT LIQD: 1.0000 | OROMUCOSAL | Status: DC | PRN

## 2012-09-12 MED ORDER — VANCOMYCIN HCL IN DEXTROSE 1-5 GM/200ML-% IV SOLN
1000.0000 mg | Freq: Once | INTRAVENOUS | Status: DC
  Filled 2012-09-12: qty 200

## 2012-09-12 MED ORDER — FENTANYL CITRATE 0.05 MG/ML IJ SOLN
INTRAMUSCULAR | Status: DC | PRN
  Administered 2012-09-12: 50 ug via INTRAVENOUS
  Administered 2012-09-12: 100 ug via INTRAVENOUS
  Administered 2012-09-12 (×2): 50 ug via INTRAVENOUS

## 2012-09-12 MED ORDER — 0.9 % SODIUM CHLORIDE (POUR BTL) OPTIME
TOPICAL | Status: DC | PRN
  Administered 2012-09-12: 1000 mL

## 2012-09-12 MED ORDER — BACITRACIN 50000 UNITS IM SOLR
INTRAMUSCULAR | Status: AC
  Filled 2012-09-12: qty 1

## 2012-09-12 MED ORDER — DEXAMETHASONE SODIUM PHOSPHATE 10 MG/ML IJ SOLN
10.0000 mg | INTRAMUSCULAR | Status: AC
  Administered 2012-09-12: 10 mg via INTRAVENOUS
  Filled 2012-09-12: qty 1

## 2012-09-12 MED ORDER — SODIUM CHLORIDE 0.9 % IR SOLN
Status: DC | PRN
  Administered 2012-09-12: 09:00:00

## 2012-09-12 MED ORDER — THROMBIN 5000 UNITS EX SOLR
CUTANEOUS | Status: DC | PRN
  Administered 2012-09-12 (×2): 5000 [IU] via TOPICAL

## 2012-09-12 MED ORDER — HYDROMORPHONE HCL PF 1 MG/ML IJ SOLN: 0.5000 mg | INTRAMUSCULAR | Status: DC | PRN

## 2012-09-12 MED ORDER — ONDANSETRON HCL 4 MG/2ML IJ SOLN: 4.0000 mg | INTRAMUSCULAR | Status: DC | PRN

## 2012-09-12 MED ORDER — NEOSTIGMINE METHYLSULFATE 1 MG/ML IJ SOLN
INTRAMUSCULAR | Status: DC | PRN
  Administered 2012-09-12: 3 mg via INTRAVENOUS

## 2012-09-12 SURGICAL SUPPLY — 55 items
ADH SKN CLS APL DERMABOND .7 (GAUZE/BANDAGES/DRESSINGS) ×1
APL SKNCLS STERI-STRIP NONHPOA (GAUZE/BANDAGES/DRESSINGS) ×1
BAG DECANTER FOR FLEXI CONT (MISCELLANEOUS) ×2 IMPLANT
BENZOIN TINCTURE PRP APPL 2/3 (GAUZE/BANDAGES/DRESSINGS) ×2 IMPLANT
BLADE SURG ROTATE 9660 (MISCELLANEOUS) IMPLANT
BRUSH SCRUB EZ PLAIN DRY (MISCELLANEOUS) ×2 IMPLANT
BUR CUTTER 7.0 ROUND (BURR) ×2 IMPLANT
CANISTER SUCTION 2500CC (MISCELLANEOUS) ×2 IMPLANT
CLOTH BEACON ORANGE TIMEOUT ST (SAFETY) ×2 IMPLANT
CONT SPEC 4OZ CLIKSEAL STRL BL (MISCELLANEOUS) ×2 IMPLANT
DECANTER SPIKE VIAL GLASS SM (MISCELLANEOUS) ×2 IMPLANT
DERMABOND ADVANCED (GAUZE/BANDAGES/DRESSINGS) ×1
DERMABOND ADVANCED .7 DNX12 (GAUZE/BANDAGES/DRESSINGS) ×1 IMPLANT
DRAPE LAPAROTOMY 100X72X124 (DRAPES) ×2 IMPLANT
DRAPE MICROSCOPE LEICA (MISCELLANEOUS) ×1 IMPLANT
DRAPE POUCH INSTRU U-SHP 10X18 (DRAPES) ×2 IMPLANT
DRAPE PROXIMA HALF (DRAPES) ×1 IMPLANT
DRAPE SURG 17X23 STRL (DRAPES) ×4 IMPLANT
DURAPREP 26ML APPLICATOR (WOUND CARE) ×2 IMPLANT
ELECT REM PT RETURN 9FT ADLT (ELECTROSURGICAL) ×2
ELECTRODE REM PT RTRN 9FT ADLT (ELECTROSURGICAL) ×1 IMPLANT
GAUZE SPONGE 4X4 16PLY XRAY LF (GAUZE/BANDAGES/DRESSINGS) IMPLANT
GLOVE BIO SURGEON STRL SZ8.5 (GLOVE) ×1 IMPLANT
GLOVE BIOGEL PI IND STRL 7.0 (GLOVE) IMPLANT
GLOVE BIOGEL PI INDICATOR 7.0 (GLOVE) ×2
GLOVE ECLIPSE 8.5 STRL (GLOVE) ×3 IMPLANT
GLOVE EXAM NITRILE LRG STRL (GLOVE) IMPLANT
GLOVE EXAM NITRILE MD LF STRL (GLOVE) IMPLANT
GLOVE EXAM NITRILE XL STR (GLOVE) IMPLANT
GLOVE EXAM NITRILE XS STR PU (GLOVE) IMPLANT
GLOVE SS BIOGEL STRL SZ 8 (GLOVE) IMPLANT
GLOVE SUPERSENSE BIOGEL SZ 8 (GLOVE) ×1
GLOVE SURG SS PI 7.0 STRL IVOR (GLOVE) ×2 IMPLANT
GOWN BRE IMP SLV AUR LG STRL (GOWN DISPOSABLE) IMPLANT
GOWN BRE IMP SLV AUR XL STRL (GOWN DISPOSABLE) ×1 IMPLANT
GOWN STRL REIN 2XL LVL4 (GOWN DISPOSABLE) IMPLANT
KIT BASIN OR (CUSTOM PROCEDURE TRAY) ×2 IMPLANT
KIT ROOM TURNOVER OR (KITS) ×2 IMPLANT
NDL SPNL 22GX3.5 QUINCKE BK (NEEDLE) ×1 IMPLANT
NEEDLE HYPO 22GX1.5 SAFETY (NEEDLE) ×2 IMPLANT
NEEDLE SPNL 22GX3.5 QUINCKE BK (NEEDLE) ×2 IMPLANT
NS IRRIG 1000ML POUR BTL (IV SOLUTION) ×2 IMPLANT
PACK LAMINECTOMY NEURO (CUSTOM PROCEDURE TRAY) ×2 IMPLANT
PAD ARMBOARD 7.5X6 YLW CONV (MISCELLANEOUS) ×6 IMPLANT
RUBBERBAND STERILE (MISCELLANEOUS) ×4 IMPLANT
SPONGE GAUZE 4X4 12PLY (GAUZE/BANDAGES/DRESSINGS) ×2 IMPLANT
SPONGE SURGIFOAM ABS GEL SZ50 (HEMOSTASIS) ×2 IMPLANT
STRIP CLOSURE SKIN 1/2X4 (GAUZE/BANDAGES/DRESSINGS) ×2 IMPLANT
SUT VIC AB 2-0 CT1 18 (SUTURE) ×2 IMPLANT
SUT VIC AB 3-0 SH 8-18 (SUTURE) ×2 IMPLANT
SYR 20ML ECCENTRIC (SYRINGE) ×2 IMPLANT
TAPE CLOTH SURG 4X10 WHT LF (GAUZE/BANDAGES/DRESSINGS) ×1 IMPLANT
TOWEL OR 17X24 6PK STRL BLUE (TOWEL DISPOSABLE) ×2 IMPLANT
TOWEL OR 17X26 10 PK STRL BLUE (TOWEL DISPOSABLE) ×2 IMPLANT
WATER STERILE IRR 1000ML POUR (IV SOLUTION) ×2 IMPLANT

## 2012-09-12 NOTE — Transfer of Care (Signed)
Immediate Anesthesia Transfer of Care Note  Patient: Michael Li  Procedure(s) Performed: Procedure(s) with comments: LUMBAR LAMINECTOMY/DECOMPRESSION MICRODISCECTOMY 1 LEVEL (Right) - Right lumbar three-four microdiscectomy  Patient Location: PACU  Anesthesia Type:General  Level of Consciousness: awake, alert  and oriented  Airway & Oxygen Therapy: Patient Spontanous Breathing and Patient connected to face mask oxygen  Post-op Assessment: Report given to PACU RN  Post vital signs: Reviewed and stable  Complications: No apparent anesthesia complications

## 2012-09-12 NOTE — Op Note (Signed)
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  Asst. Surgeon: Lovell Sheehan  Anesthesia: General  Indication: 57 year old male with right lower chamois pain paresthesias and weakness consistent with a right-sided L3 radiculopathy which has failed conservative management her workup demonstrates evidence of a foraminal/extraforaminal disc herniation with marked compression the left-sided L3 nerve root. Patient presents now for right-sided extraforaminal microdiscectomy.  Operative note: After induction of anesthesia, patient positioned prone onto Wilson frame and appropriate padded. Lumbar region prepped and draped. Incision made over the L3-4 interspace. Subperiosteal dissection performed the right side. Retractor placed. X-ray taken. Level confirmed. Extra foraminal approach performed by removing a small aspect of the lateral margin of the superior facet of L4. Intertransverse ligament was dissected free elevated and resected. Microscope brought field these were microdissection of the extraforaminal space. Right-sided L3 nerve root identified as was the bulging disc space. The disc spaces incised. A large fragment of disc was then dissected free. The disc itself was very degenerative and multiple smaller fragments were emanating from the interspace. With that in mind the disc space was entered and a reasonably aggressive intradiscal decompression was performed removing all loose her oxygen for subdural. At this point a very thorough discectomy been achieved. There is no evidence of injury to thecal sac or nerve roots. Wounds that area and with MI solution. Gelfoam was placed topically for hemostasis. Microscope and retractor system were removed. Hemostasis muscle achieved  with electrocautery. Wounds and close in layers with Vicryl sutures. Steri-Strips and sterile dressing were applied. There were no apparent complications. Patient tolerated the procedure well and he returns to the recovery room postop.

## 2012-09-12 NOTE — Discharge Summary (Signed)
Physician Discharge Summary  Patient ID: Michael Li MRN: [id number] DOB/AGE: [birth year] 57 y.o.  Admit date: 09/12/2012 Discharge date: 09/12/2012  Admission Diagnoses:  Discharge Diagnoses:  Principal Problem:   HNP (herniated nucleus pulposus), lumbar   Discharged Condition: good  Hospital Course: Patient admitted to the hospital where he underwent uncomplicated right-sided L3-4 extraforaminal microdiscectomy. Postoperatively he is doing well. His back and lower chamois pain are resolved. Strength and sensation are intact. Ready for discharge home.  Consults:   Significant Diagnostic Studies:   Treatments:   Discharge Exam: Blood pressure 152/90, pulse 88, temperature 98.4 F (36.9 C), temperature source Oral, resp. rate 16, SpO2 96.00%. And alert. Oriented and appropriate. Cranial nerve function intact. Motor and sensory function of the extremities normal. Wound clean and dry. Chest and abdomen benign.  Disposition: Final discharge disposition not confirmed     Medication List         amphetamine-dextroamphetamine 30 MG 24 hr capsule  Commonly known as:  ADDERALL XR  Take 1 capsule (30 mg total) by mouth every morning.     amphetamine-dextroamphetamine 10 MG tablet  Commonly known as:  ADDERALL  Take 10 mg by mouth 3 (three) times daily as needed (ADD).     aspirin 81 MG tablet  Take 81 mg by mouth daily.     cyclobenzaprine 10 MG tablet  Commonly known as:  FLEXERIL  Take 1 tablet (10 mg total) by mouth 3 (three) times daily as needed for muscle spasms.     freestyle lancets  1 each by Other route daily. Dx 250.00     glucose blood test strip  Commonly known as:  FREESTYLE LITE  1 each by Other route daily. Dx 250.00     HYDROcodone-acetaminophen 5-325 MG per tablet  Commonly known as:  NORCO/VICODIN  Take 1-2 tablets by mouth every 4 (four) hours as needed for pain.     insulin aspart 100 UNIT/ML Sopn FlexPen  Commonly known as:  NOVOLOG FLEXPEN   Inject subcutaneously (just before each meal) 22-22-20 units     Insulin Isophane Human 100 UNIT/ML Supn  Commonly known as:  HUMULIN N KWIKPEN  Inject 35 Units into the skin at bedtime.     Insulin Pen Needle 32G X 4 MM Misc  Commonly known as:  BD PEN NEEDLE NANO U/F  1 each by Does not apply route daily.     INSULIN SYRINGE .5CC/31GX5/16" 31G X 5/16" 0.5 ML Misc  Inject 1 each as directed daily.     Liraglutide 18 MG/3ML Sopn  Inject 1.8 mg into the skin daily before breakfast.     MAGNESIUM PO  Take 1 tablet by mouth daily.     metFORMIN 1000 MG tablet  Commonly known as:  GLUCOPHAGE  Take 1 tablet (1,000 mg total) by mouth 2 (two) times daily with a meal.     multivitamin with minerals Tabs  Take 1 tablet by mouth daily.     omeprazole 40 MG capsule  Commonly known as:  PRILOSEC  Take 1 capsule (40 mg total) by mouth daily.     POTASSIUM PO  Take 1 tablet by mouth daily.           Follow-up Information   Follow up with Syenna Nazir A, MD. Schedule an appointment as soon as possible for a visit in 1 week. (ext [phone])    Contact information:   [address] [place] [address] [phone]  Signed: Maui Britten A 09/12/2012, 6:26 PM

## 2012-09-12 NOTE — Anesthesia Preprocedure Evaluation (Addendum)
Anesthesia Evaluation  Patient identified by MRN, date of birth, ID band Patient awake    Reviewed: Allergy & Precautions, H&P , NPO status , Patient's Chart, lab work & pertinent test results  Airway Mallampati: II TM Distance: >3 FB Neck ROM: Full    Dental  (+) Teeth Intact   Pulmonary neg pulmonary ROS,  breath sounds clear to auscultation  Pulmonary exam normal       Cardiovascular Rhythm:Regular Rate:Normal     Neuro/Psych    GI/Hepatic GERD-  Medicated and Controlled,(+) Hepatitis -, C  Endo/Other  diabetes, Well Controlled, Type 2, Insulin Dependent  Renal/GU      Musculoskeletal   Abdominal Normal abdominal exam  (+)   Peds  Hematology   Anesthesia Other Findings   Reproductive/Obstetrics                          Anesthesia Physical Anesthesia Plan  ASA: III  Anesthesia Plan: General   Post-op Pain Management:    Induction: Intravenous  Airway Management Planned: Oral ETT  Additional Equipment:   Intra-op Plan:   Post-operative Plan: Extubation in OR  Informed Consent: I have reviewed the patients History and Physical, chart, labs and discussed the procedure including the risks, benefits and alternatives for the proposed anesthesia with the patient or authorized representative who has indicated his/her understanding and acceptance.   Dental advisory given  Plan Discussed with: CRNA, Anesthesiologist and Surgeon  Anesthesia Plan Comments:         Anesthesia Quick Evaluation

## 2012-09-12 NOTE — Progress Notes (Signed)
ANTIBIOTIC CONSULT NOTE - INITIAL  Pharmacy Consult for Vancomycin Indication: Surgical Proph  Allergies  Allergen Reactions  . Cephalexin Swelling    In throat    Patient Measurements:   Body Weight: 85 Kg  Vital Signs: Temp: 98.4 F (36.9 C) (07/30 1128) Temp src: Oral (07/30 0721) BP: 166/82 mmHg (07/30 1128) Pulse Rate: 90 (07/30 1128) Intake/Output from previous day:   Intake/Output from this shift: Total I/O In: 1300 [I.V.:1300] Out: 50 [Blood:50]  Labs:  Recent Labs  09/10/12 0914  WBC 6.8  HGB 17.3*  PLT 148*  CREATININE 0.92   The CrCl is unknown because both a height and weight (above a minimum accepted value) are required for this calculation. No results found for this basename: VANCOTROUGH, Leodis Binet, VANCORANDOM, GENTTROUGH, GENTPEAK, GENTRANDOM, TOBRATROUGH, TOBRAPEAK, TOBRARND, AMIKACINPEAK, AMIKACINTROU, AMIKACIN,  in the last 72 hours   Microbiology: Recent Results (from the past 720 hour(s))  SURGICAL PCR SCREEN     Status: Abnormal   Collection Time    09/10/12  9:05 AM      Result Value Range Status   MRSA, PCR NEGATIVE  NEGATIVE Final   Staphylococcus aureus POSITIVE (*) NEGATIVE Final   Comment:            The Xpert SA Assay (FDA     approved for NASAL specimens     in patients over 34 years of age),     is one component of     a comprehensive surveillance     program.  Test performance has     been validated by [employer] for patients greater     than or equal to 66 year old.     It is not intended     to diagnose infection nor to     guide or monitor treatment.    Medical History: Past Medical History  Diagnosis Date  . ATTENTION DEFICIT HYPERACTIVITY DISORDER 07/19/2006  . BENIGN PROSTATIC HYPERTROPHY 07/19/2006  . DIABETES MELLITUS, TYPE II 07/19/2006  . ERECTILE DYSFUNCTION 07/19/2006  . GERD 07/19/2006  . HEPATITIS C, CHRONIC 04/03/2007  . NEOPLASM, MALIGNANT, CARCINOMA, BASAL CELL, NOSE 10/20/2009  . Anemia     rec'd 32  units of blood, post T&A, 1981    Assessment: 57 YOM s/p R lumbar 3-4 microdiscectomy, pharmacy is consulted to dose vancomycin for surgical prophylaxis. Pt. Received 1 dose of vancomycin 1000 mg at 0830. Afebrile, wbc 6.8. Scr 0.92, est. crcl > 90 ml/min. No drain in place (confirmed with RN).  Goal of Therapy:  Prevent surgical infections  Plan:  Vancomycin 1000 mg IV x 1 at 2000 Pharmacy will sign off.  Bayard Hugger, PharmD, BCPS  Clinical Pharmacist  Pager: [phone]   09/12/2012,11:45 AM

## 2012-09-12 NOTE — Brief Op Note (Signed)
09/12/2012  9:39 AM  PATIENT:  Michael Li  57 y.o. male  PRE-OPERATIVE DIAGNOSIS:  Herniated Nucleus Pulposus  POST-OPERATIVE DIAGNOSIS:  Herniated Nucleus Pulposus  PROCEDURE:  Procedure(s) with comments: LUMBAR LAMINECTOMY/DECOMPRESSION MICRODISCECTOMY 1 LEVEL (Right) - Right lumbar three-four microdiscectomy  SURGEON:  Surgeon(s) and Role:    * Temple Pacini, MD - Primary    * Cristi Loron, MD - Assisting  PHYSICIAN ASSISTANT:   ASSISTANTS:    ANESTHESIA:   general  EBL:  Total I/O In: 1000 [I.V.:1000] Out: 50 [Blood:50]  BLOOD ADMINISTERED:none  DRAINS: none   LOCAL MEDICATIONS USED:  MARCAINE     SPECIMEN:  No Specimen  DISPOSITION OF SPECIMEN:  N/A  COUNTS:  YES  TOURNIQUET:  * No tourniquets in log *  DICTATION: .Dragon Dictation  PLAN OF CARE: Admit for overnight observation  PATIENT DISPOSITION:  PACU - hemodynamically stable.   Delay start of Pharmacological VTE agent (>24hrs) due to surgical blood loss or risk of bleeding: yes

## 2012-09-12 NOTE — H&P (Signed)
Michael Li is an 57 y.o. male.   Chief Complaint: Right leg pain HPI: 57 year old male with right lower chamois pain paresthesias and weakness consistent with a right-sided L3 radiculopathy failing conservative management her workup demonstrates evidence of a significant right-sided L3-4 extra foraminal disc herniation with marked compression of the right-sided L3 nerve root. Patient's failed conservative management and presents now for extraforaminal microdiscectomy. He has no left-sided symptoms. No bowel or bladder dysfunction.  Past Medical History  Diagnosis Date  . ATTENTION DEFICIT HYPERACTIVITY DISORDER 07/19/2006  . BENIGN PROSTATIC HYPERTROPHY 07/19/2006  . DIABETES MELLITUS, TYPE II 07/19/2006  . ERECTILE DYSFUNCTION 07/19/2006  . GERD 07/19/2006  . HEPATITIS C, CHRONIC 04/03/2007  . NEOPLASM, MALIGNANT, CARCINOMA, BASAL CELL, NOSE 10/20/2009  . Anemia     rec'd 32 units of blood, post T&A, 1981    Past Surgical History  Procedure Laterality Date  . Appendectomy    . Tonsillectomy    . Carotid injury        post tonsillectomy, R carotid injury with trach  . Tracheostomy closure  1981  . Hernia repair  1973    inguinal- R side    Family History  Problem Relation Age of Onset  . Diabetes Maternal Aunt   . Cancer Paternal Grandmother     colon cancer  . Diabetes Maternal Aunt    Social History:  reports that he has quit smoking. He quit smokeless tobacco use about 5 years ago. His alcohol and drug histories are not on file.  Allergies:  Allergies  Allergen Reactions  . Cephalexin Swelling    In throat    Medications Prior to Admission  Medication Sig Dispense Refill  . amphetamine-dextroamphetamine (ADDERALL XR) 30 MG 24 hr capsule Take 1 capsule (30 mg total) by mouth every morning.  30 capsule  0  . aspirin 81 MG tablet Take 81 mg by mouth daily.        . cyclobenzaprine (FLEXERIL) 10 MG tablet Take 10 mg by mouth 3 (three) times daily as needed for muscle spasms.       Marland Kitchen HYDROcodone-acetaminophen (NORCO/VICODIN) 5-325 MG per tablet Take 1 tablet by mouth every 6 (six) hours as needed for pain.      Marland Kitchen insulin aspart (NOVOLOG FLEXPEN) 100 unit/mL SOLN FlexPen Inject subcutaneously (just before each meal) 22-22-20 units  25 pen  3  . Insulin Isophane Human (HUMULIN N KWIKPEN) 100 UNIT/ML SUPN Inject 35 Units into the skin at bedtime.  15 pen  3  . MAGNESIUM PO Take 1 tablet by mouth daily.      . metFORMIN (GLUCOPHAGE) 1000 MG tablet Take 1 tablet (1,000 mg total) by mouth 2 (two) times daily with a meal.  180 tablet  3  . Multiple Vitamin (MULTIVITAMIN WITH MINERALS) TABS Take 1 tablet by mouth daily.      Marland Kitchen omeprazole (PRILOSEC) 40 MG capsule Take 1 capsule (40 mg total) by mouth daily.  90 capsule  3  . POTASSIUM PO Take 1 tablet by mouth daily.      Marland Kitchen amphetamine-dextroamphetamine (ADDERALL) 10 MG tablet Take 10 mg by mouth 3 (three) times daily as needed (ADD).      Marland Kitchen glucose blood (FREESTYLE LITE) test strip 1 each by Other route daily. Dx 250.00  100 each  5  . Insulin Pen Needle (BD PEN NEEDLE NANO U/F) 32G X 4 MM MISC 1 each by Does not apply route daily.  100 each  5  . Insulin  Syringe-Needle U-100 (INSULIN SYRINGE .5CC/31GX5/16") 31G X 5/16" 0.5 ML MISC Inject 1 each as directed daily.  100 each  3  . Lancets (FREESTYLE) lancets 1 each by Other route daily. Dx 250.00  100 each  5  . Liraglutide 18 MG/3ML SOPN Inject 1.8 mg into the skin daily before breakfast.        Results for orders placed during the hospital encounter of 09/12/12 (from the past 48 hour(s))  APTT     Status: None   Collection Time    09/12/12  6:58 AM      Result Value Range   aPTT 27  24 - 37 seconds  PROTIME-INR     Status: None   Collection Time    09/12/12  6:58 AM      Result Value Range   Prothrombin Time 12.8  11.6 - 15.2 seconds   INR 0.98  0.00 - 1.49   No results found.  Review of Systems  HENT: Negative.   Eyes: Negative.   Respiratory: Negative.    Cardiovascular: Negative.   Gastrointestinal: Negative.   Genitourinary: Negative.   Musculoskeletal: Negative.   Skin: Negative.   Neurological: Positive for focal weakness and weakness.  Endo/Heme/Allergies: Negative.   Psychiatric/Behavioral: Negative.     Blood pressure 151/91, pulse 96, temperature 96.9 F (36.1 C), temperature source Oral, resp. rate 16, SpO2 96.00%. Physical Exam  Constitutional: He is oriented to person, place, and time. He appears well-developed and well-nourished. No distress.  HENT:  Head: Normocephalic and atraumatic.  Right Ear: External ear normal.  Left Ear: External ear normal.  Nose: Nose normal.  Mouth/Throat: Oropharynx is clear and moist.  Eyes: Conjunctivae and EOM are normal. Pupils are equal, round, and reactive to light. Right eye exhibits no discharge. Left eye exhibits no discharge.  Neck: Normal range of motion. No tracheal deviation present. No thyromegaly present.  Cardiovascular: Normal rate, normal heart sounds and intact distal pulses.  Exam reveals no friction rub.   No murmur heard. Respiratory: Breath sounds normal. No respiratory distress. He has no wheezes.  GI: Soft. Bowel sounds are normal. He exhibits no distension. There is no tenderness.  Musculoskeletal: Normal range of motion. He exhibits no edema and no tenderness.  Neurological: He is alert and oriented to person, place, and time. He has normal reflexes. He displays normal reflexes. No cranial nerve deficit. He exhibits normal muscle tone. Coordination normal.  Right quadriceps weakness and wasting. Right L3 decrease sensation to pinprick and light touch. Right patellar reflex diminished. Straight leg raising positive on the right.  Skin: Skin is warm and dry. No rash noted. He is not diaphoretic. No erythema. No pallor.  Psychiatric: He has a normal mood and affect. His behavior is normal. Thought content normal.     Assessment/Plan Right L3-4 extraforaminal herniated  nucleus pulposus with radiculopathy. Plan right L3-4 extraforaminal microdiscectomy. Risks and benefits have been explained. Patient wished to proceed.  Malayshia All A 09/12/2012, 8:21 AM

## 2012-09-12 NOTE — Preoperative (Signed)
Beta Blockers   Reason not to administer Beta Blockers:Not Applicable 

## 2012-09-12 NOTE — Progress Notes (Signed)
Pt doing very well. Pt given D/C instructions with Rx's, verbal understanding given. Pt D/C'd home via wheelchair @ 1855 per MD order. Rema Fendt, RN

## 2012-09-12 NOTE — Anesthesia Postprocedure Evaluation (Signed)
  Anesthesia Post-op Note  Patient: Michael Li  Procedure(s) Performed: Procedure(s) with comments: LUMBAR LAMINECTOMY/DECOMPRESSION MICRODISCECTOMY 1 LEVEL (Right) - Right lumbar three-four microdiscectomy  Patient Location: PACU  Anesthesia Type:General  Level of Consciousness: awake  Airway and Oxygen Therapy: Patient Spontanous Breathing  Post-op Pain: mild  Post-op Assessment: Post-op Vital signs reviewed  Post-op Vital Signs: Reviewed  Complications: No apparent anesthesia complications

## 2012-09-14 ENCOUNTER — Encounter (HOSPITAL_COMMUNITY): Payer: Self-pay | Admitting: Neurosurgery

## 2012-09-21 LAB — HM DIABETES EYE EXAM

## 2012-09-28 ENCOUNTER — Encounter: Payer: Self-pay | Admitting: Internal Medicine

## 2012-10-17 ENCOUNTER — Other Ambulatory Visit: Payer: Self-pay | Admitting: Internal Medicine

## 2012-10-23 ENCOUNTER — Telehealth: Payer: Self-pay | Admitting: Internal Medicine

## 2012-10-23 NOTE — Telephone Encounter (Signed)
Pt is calling to request a 3 month supply of his amphetamine-dextroamphetamine (ADDERALL XR) 30 MG 24 hr capsule, and amphetamine-dextroamphetamine (ADDERALL) 10 MG tablet. Please assist.

## 2012-10-23 NOTE — Telephone Encounter (Signed)
Left detailed message Rx's will be ready on Thurs, Dr. Kirtland Bouchard is out of the office.

## 2012-10-25 MED ORDER — AMPHETAMINE-DEXTROAMPHETAMINE 10 MG PO TABS: 10.0000 mg | ORAL_TABLET | Freq: Three times a day (TID) | ORAL | Status: DC

## 2012-10-25 MED ORDER — AMPHETAMINE-DEXTROAMPHET ER 30 MG PO CP24: 30.0000 mg | ORAL_CAPSULE | ORAL | Status: DC

## 2012-10-25 MED ORDER — AMPHETAMINE-DEXTROAMPHETAMINE 10 MG PO TABS: 10.0000 mg | ORAL_TABLET | Freq: Three times a day (TID) | ORAL | Status: DC | PRN

## 2012-10-25 NOTE — Telephone Encounter (Signed)
Rx's printed and signed put at front desk for pt.

## 2012-11-05 ENCOUNTER — Telehealth: Payer: Self-pay | Admitting: Internal Medicine

## 2012-11-05 NOTE — Telephone Encounter (Signed)
Pt would like to know if we have a sample (one pen) Liraglutide 18 MG/3ML SOPN Pt did not know he was out and it takes several days for him to receive OK to leave message

## 2012-11-05 NOTE — Telephone Encounter (Signed)
Left detailed message do have a sample of Victoza and is put back for him.

## 2012-11-15 ENCOUNTER — Telehealth: Payer: Self-pay | Admitting: Internal Medicine

## 2012-11-15 MED ORDER — TADALAFIL 20 MG PO TABS: 20.0000 mg | ORAL_TABLET | Freq: Every day | ORAL | Status: DC | PRN

## 2012-11-15 NOTE — Telephone Encounter (Signed)
Left message Rx was sent to pharmacy as requested. 

## 2012-11-15 NOTE — Telephone Encounter (Signed)
Pt is calling to request a refill of his cialis be sent to [facility] on [address]. Please assist.

## 2012-11-16 NOTE — Telephone Encounter (Signed)
Spoke to pt told him a sample of Victoza will put back for him. Pt verbalized understanding.

## 2012-11-16 NOTE — Telephone Encounter (Addendum)
Pt states his pharm is still out of VICTOZA 18 MG/3ML SOLN  Pt would like another sample please. Today

## 2012-12-20 ENCOUNTER — Other Ambulatory Visit: Payer: Self-pay

## 2013-01-31 ENCOUNTER — Telehealth: Payer: Self-pay | Admitting: Internal Medicine

## 2013-01-31 NOTE — Telephone Encounter (Signed)
Pt requesting refill of amphetamine-dextroamphetamine (ADDERALL XR) 30 MG 24 hr capsule. °

## 2013-02-01 MED ORDER — AMPHETAMINE-DEXTROAMPHETAMINE 10 MG PO TABS: 10.0000 mg | ORAL_TABLET | Freq: Three times a day (TID) | ORAL | Status: DC

## 2013-02-01 MED ORDER — AMPHETAMINE-DEXTROAMPHETAMINE 10 MG PO TABS: 10.0000 mg | ORAL_TABLET | Freq: Three times a day (TID) | ORAL | Status: DC | PRN

## 2013-02-01 MED ORDER — AMPHETAMINE-DEXTROAMPHET ER 30 MG PO CP24: 30.0000 mg | ORAL_CAPSULE | ORAL | Status: DC

## 2013-02-01 NOTE — Telephone Encounter (Signed)
Left detailed message Rx's ready for pickup, will be at front desk. Rx's printed and signed.

## 2013-03-12 ENCOUNTER — Other Ambulatory Visit: Payer: Self-pay | Admitting: Dermatology

## 2013-04-17 ENCOUNTER — Other Ambulatory Visit: Payer: Self-pay | Admitting: Internal Medicine

## 2013-05-29 ENCOUNTER — Telehealth: Payer: Self-pay | Admitting: Internal Medicine

## 2013-05-29 NOTE — Telephone Encounter (Signed)
Pr request 3 mo supply amphetamine-dextroamphetamine (ADDERALL XR) 30 MG 24 hr capsule AND amphetamine-dextroamphetamine (ADDERALL) 20 MG tablet The lat time pt received 10 mg. But pt prefers the 20 mg

## 2013-05-31 MED ORDER — AMPHETAMINE-DEXTROAMPHETAMINE 20 MG PO TABS: 20.0000 mg | ORAL_TABLET | Freq: Three times a day (TID) | ORAL | Status: DC

## 2013-05-31 MED ORDER — AMPHETAMINE-DEXTROAMPHET ER 30 MG PO CP24: 30.0000 mg | ORAL_CAPSULE | ORAL | Status: DC

## 2013-05-31 NOTE — Telephone Encounter (Signed)
Left detailed message Rx's ready for pickup, will be at the front desk. Rx's printed and signed. 

## 2013-06-17 ENCOUNTER — Telehealth: Payer: Self-pay | Admitting: Internal Medicine

## 2013-06-17 NOTE — Telephone Encounter (Signed)
Michael Li is needing verbal order for pt's rx glucose blood (FREESTYLE LITE) test strip or faxed # [phone].

## 2013-06-18 ENCOUNTER — Other Ambulatory Visit: Payer: Self-pay | Admitting: Internal Medicine

## 2013-06-18 NOTE — Telephone Encounter (Signed)
Left message on voicemail to call office.  

## 2013-06-18 NOTE — Telephone Encounter (Signed)
Spoke to pt told him I received a call from [facility] requesting order for test strips. Asked pt if he wants to use them? Pt said he filled out a thing on line and then they messaged him about a meter and supplies but he will just stick to local pharmacy right now. Told him okay.

## 2013-06-25 ENCOUNTER — Telehealth: Payer: Self-pay | Admitting: Internal Medicine

## 2013-06-25 NOTE — Telephone Encounter (Signed)
[facility] and told them pt does not want to use them at present time. Pt is using local pharmacy.

## 2013-06-25 NOTE — Telephone Encounter (Addendum)
[facility] [phone]  is calling requesting a verbal order for pt generic test strip.

## 2013-07-04 ENCOUNTER — Telehealth: Payer: Self-pay | Admitting: Internal Medicine

## 2013-07-04 NOTE — Telephone Encounter (Signed)
Samples of Humalog pen and victoza is available for pick up. Left message on machine for patient.

## 2013-07-04 NOTE — Telephone Encounter (Signed)
Patient calling to request samples of Victoza and Humalin.  If samples are not available patient needs a prior authorization completed for these meds. Pt is concerned that he has an upcoming appt with PCP and thinks is meds may be change.  Therefore, he does not want to pay for the meds which are $200 if it will be changed to something else.  Local Pharmacy: [facility]:  [facility]

## 2013-08-02 ENCOUNTER — Other Ambulatory Visit: Payer: Self-pay | Admitting: *Deleted

## 2013-08-02 MED ORDER — LIRAGLUTIDE 18 MG/3ML ~~LOC~~ SOPN: 1.8000 mg | PEN_INJECTOR | Freq: Every day | SUBCUTANEOUS | Status: DC

## 2013-08-08 ENCOUNTER — Other Ambulatory Visit (INDEPENDENT_AMBULATORY_CARE_PROVIDER_SITE_OTHER): Payer: 59

## 2013-08-08 DIAGNOSIS — Z0000 Encounter for general adult medical examination without abnormal findings: Secondary | ICD-10-CM

## 2013-08-08 LAB — LIPID PANEL
Cholesterol: 152 mg/dL (ref 0–200)
HDL: 37.1 mg/dL — ABNORMAL LOW (ref >39.00)
LDL Cholesterol: 99 mg/dL (ref 0–99)
NonHDL: 114.9
Total CHOL/HDL Ratio: 4
Triglycerides: 78 mg/dL (ref 0.0–149.0)
VLDL: 15.6 mg/dL (ref 0.0–40.0)

## 2013-08-08 LAB — POCT URINALYSIS DIPSTICK
Bilirubin, UA: NEGATIVE
Blood, UA: NEGATIVE
Ketones, UA: NEGATIVE
Leukocytes, UA: NEGATIVE
Nitrite, UA: NEGATIVE
Protein, UA: NEGATIVE
Spec Grav, UA: 1.02
Urobilinogen, UA: 0.2
pH, UA: 5.5

## 2013-08-08 LAB — HEPATIC FUNCTION PANEL
ALT: 185 U/L — ABNORMAL HIGH (ref 0–53)
AST: 90 U/L — ABNORMAL HIGH (ref 0–37)
Albumin: 3.8 g/dL (ref 3.5–5.2)
Alkaline Phosphatase: 63 U/L (ref 39–117)
Bilirubin, Direct: 0.3 mg/dL (ref 0.0–0.3)
Total Bilirubin: 1 mg/dL (ref 0.2–1.2)
Total Protein: 6.4 g/dL (ref 6.0–8.3)

## 2013-08-08 LAB — MICROALBUMIN / CREATININE URINE RATIO
Creatinine,U: 127.4 mg/dL
Microalb Creat Ratio: 2 mg/g (ref 0.0–30.0)
Microalb, Ur: 2.6 mg/dL — ABNORMAL HIGH (ref 0.0–1.9)

## 2013-08-08 LAB — CBC WITH DIFFERENTIAL/PLATELET
Basophils Absolute: 0 10*3/uL (ref 0.0–0.1)
Basophils Relative: 0.3 % (ref 0.0–3.0)
Eosinophils Absolute: 0.1 10*3/uL (ref 0.0–0.7)
Eosinophils Relative: 1.4 % (ref 0.0–5.0)
HCT: 45.7 % (ref 39.0–52.0)
Hemoglobin: 15.6 g/dL (ref 13.0–17.0)
Lymphocytes Relative: 31.6 % (ref 12.0–46.0)
Lymphs Abs: 1.4 10*3/uL (ref 0.7–4.0)
MCHC: 34 g/dL (ref 30.0–36.0)
MCV: 91.5 fl (ref 78.0–100.0)
Monocytes Absolute: 0.5 10*3/uL (ref 0.1–1.0)
Monocytes Relative: 10.9 % (ref 3.0–12.0)
Neutro Abs: 2.6 10*3/uL (ref 1.4–7.7)
Neutrophils Relative %: 55.8 % (ref 43.0–77.0)
Platelets: 122 10*3/uL — ABNORMAL LOW (ref 150.0–400.0)
RBC: 5 Mil/uL (ref 4.22–5.81)
RDW: 12.8 % (ref 11.5–15.5)
WBC: 4.6 10*3/uL (ref 4.0–10.5)

## 2013-08-08 LAB — TSH: TSH: 1.77 u[IU]/mL (ref 0.35–4.50)

## 2013-08-08 LAB — BASIC METABOLIC PANEL
BUN: 18 mg/dL (ref 6–23)
CO2: 28 mEq/L (ref 19–32)
Calcium: 9.2 mg/dL (ref 8.4–10.5)
Chloride: 104 mEq/L (ref 96–112)
Creatinine, Ser: 0.7 mg/dL (ref 0.4–1.5)
GFR: 115.33 mL/min (ref >60.00)
Glucose, Bld: 282 mg/dL — ABNORMAL HIGH (ref 70–99)
Potassium: 4.2 mEq/L (ref 3.5–5.1)
Sodium: 137 mEq/L (ref 135–145)

## 2013-08-08 LAB — HEMOGLOBIN A1C: Hgb A1c MFr Bld: 9.6 % — ABNORMAL HIGH (ref 4.6–6.5)

## 2013-08-08 LAB — PSA: PSA: 0.91 ng/mL (ref 0.10–4.00)

## 2013-08-13 ENCOUNTER — Ambulatory Visit (INDEPENDENT_AMBULATORY_CARE_PROVIDER_SITE_OTHER): Payer: 59 | Admitting: Internal Medicine

## 2013-08-13 ENCOUNTER — Encounter: Payer: Self-pay | Admitting: Internal Medicine

## 2013-08-13 VITALS — BP 120/72 | HR 100 | Temp 98.5°F | Resp 20 | Ht 67.5 in | Wt 191.0 lb

## 2013-08-13 DIAGNOSIS — N400 Enlarged prostate without lower urinary tract symptoms: Secondary | ICD-10-CM

## 2013-08-13 DIAGNOSIS — E119 Type 2 diabetes mellitus without complications: Secondary | ICD-10-CM

## 2013-08-13 DIAGNOSIS — F909 Attention-deficit hyperactivity disorder, unspecified type: Secondary | ICD-10-CM

## 2013-08-13 DIAGNOSIS — Z0000 Encounter for general adult medical examination without abnormal findings: Secondary | ICD-10-CM

## 2013-08-13 DIAGNOSIS — B182 Chronic viral hepatitis C: Secondary | ICD-10-CM

## 2013-08-13 MED ORDER — LIRAGLUTIDE 18 MG/3ML ~~LOC~~ SOPN: 1.8000 mg | PEN_INJECTOR | Freq: Every day | SUBCUTANEOUS | Status: DC

## 2013-08-13 NOTE — Patient Instructions (Addendum)
It is important that you exercise regularly, at least 20 minutes 3 to 4 times per week.  If you develop chest pain or shortness of breath seek  medical attention.   Please check your hemoglobin A1c every 3 months  Endocrinology followup as discussed  Please see your eye doctor yearly to check for diabetic eye damage  followup chronic hepatitis C clinic

## 2013-08-13 NOTE — Progress Notes (Signed)
Subjective:    Patient ID: Michael Li, male    DOB: 03-17-1955, 58 y.o.   MRN: [id number]  HPI  58 year old patient who is in today for a preventive health examination.  He has not been seen in about one year He has diabetes mellitus, which has been poorly controlled.  At the present time.  He is treated with  NPH insulin 35 units at bedtime and regular insulin 22 units prior to each meal 3 times daily.  He  adds an additional 3-4 units if blood sugar is elevated.  He has seen endocrinology in the past, but wishes to change providers  Past Medical History  Diagnosis Date  . ATTENTION DEFICIT HYPERACTIVITY DISORDER 07/19/2006  . BENIGN PROSTATIC HYPERTROPHY 07/19/2006  . DIABETES MELLITUS, TYPE II 07/19/2006  . ERECTILE DYSFUNCTION 07/19/2006  . GERD 07/19/2006  . HEPATITIS C, CHRONIC 04/03/2007  . NEOPLASM, MALIGNANT, CARCINOMA, BASAL CELL, NOSE 10/20/2009  . Anemia     rec'd 32 units of blood, post T&A, 1981    History   Social History  . Marital Status: Single    Spouse Name: N/A    Number of Children: 2  . Years of Education: N/A   Occupational History  . Designer, television/film set    Social History Main Topics  . Smoking status: Former Research scientist (life sciences)  . Smokeless tobacco: Former Systems developer    Quit date: 02/15/2007  . Alcohol Use: Not on file  . Drug Use: Not on file  . Sexual Activity: Not on file   Other Topics Concern  . Not on file   Social History Narrative   Regular exercise-yes    Past Surgical History  Procedure Laterality Date  . Appendectomy    . Tonsillectomy    . Carotid injury        post tonsillectomy, R carotid injury with trach  . Tracheostomy closure  1981  . Hernia repair  1973    inguinal- R side  . Lumbar laminectomy/decompression microdiscectomy Right 09/12/2012    Procedure: LUMBAR LAMINECTOMY/DECOMPRESSION MICRODISCECTOMY 1 LEVEL;  Surgeon: Charlie Pitter, MD;  Location: [facility];  Service: Neurosurgery;  Laterality: Right;  Right lumbar three-four  microdiscectomy    Family History  Problem Relation Age of Onset  . Diabetes Maternal Aunt   . Cancer Paternal Grandmother     colon cancer  . Diabetes Maternal Aunt     Allergies  Allergen Reactions  . Cephalexin Swelling    In throat    Current Outpatient Prescriptions on File Prior to Visit  Medication Sig Dispense Refill  . amphetamine-dextroamphetamine (ADDERALL XR) 30 MG 24 hr capsule Take 1 capsule (30 mg total) by mouth every morning.  30 capsule  0  . amphetamine-dextroamphetamine (ADDERALL XR) 30 MG 24 hr capsule Take 1 capsule (30 mg total) by mouth every morning.  30 capsule  0  . amphetamine-dextroamphetamine (ADDERALL XR) 30 MG 24 hr capsule Take 1 capsule (30 mg total) by mouth every morning.  30 capsule  0  . amphetamine-dextroamphetamine (ADDERALL) 20 MG tablet Take 1 tablet (20 mg total) by mouth 3 (three) times daily.  90 tablet  0  . amphetamine-dextroamphetamine (ADDERALL) 20 MG tablet Take 1 tablet (20 mg total) by mouth 3 (three) times daily.  90 tablet  0  . amphetamine-dextroamphetamine (ADDERALL) 20 MG tablet Take 1 tablet (20 mg total) by mouth 3 (three) times daily.  90 tablet  0  . aspirin 81 MG tablet Take 81 mg by mouth  daily.        . glucose blood (FREESTYLE LITE) test strip 1 each by Other route daily. Dx 250.00  100 each  5  . insulin aspart (NOVOLOG FLEXPEN) 100 unit/mL SOLN FlexPen Inject subcutaneously (just before each meal) 22-22-20 units  25 pen  3  . Insulin Isophane Human (HUMULIN N KWIKPEN) 100 UNIT/ML SUPN Inject 35 Units into the skin at bedtime.  15 pen  3  . Insulin Pen Needle (BD PEN NEEDLE NANO U/F) 32G X 4 MM MISC 1 each by Does not apply route daily as needed.  100 each  4  . Insulin Syringe-Needle U-100 (INSULIN SYRINGE .5CC/31GX5/16") 31G X 5/16" 0.5 ML MISC Inject 1 each as directed daily.  100 each  3  . Lancets (FREESTYLE) lancets 1 each by Other route daily. Dx 250.00  100 each  5  . Liraglutide 18 MG/3ML SOPN Inject 1.8 mg  into the skin daily before breakfast.  1 pen  0  . metFORMIN (GLUCOPHAGE) 1000 MG tablet TAKE 1 TABLET BY MOUTH TWICE DAILY WITH A MEAL  180 tablet  1  . Multiple Vitamin (MULTIVITAMIN WITH MINERALS) TABS Take 1 tablet by mouth daily.      Marland Kitchen omeprazole (PRILOSEC) 40 MG capsule Take 1 capsule (40 mg total) by mouth daily.  90 capsule  3  . tadalafil (CIALIS) 20 MG tablet Take 1 tablet (20 mg total) by mouth daily as needed for erectile dysfunction.  10 tablet  1   No current facility-administered medications on file prior to visit.    BP 120/72  Pulse 100  Temp(Src) 98.5 F (36.9 C) (Oral)  Resp 20  Ht 5' 7.5" (1.715 m)  Wt 191 lb (86.637 kg)  BMI 29.46 kg/m2  SpO2 97%     Review of Systems  Constitutional: Negative for fever, chills, activity change, appetite change and fatigue.  HENT: Negative for congestion, dental problem, ear pain, hearing loss, mouth sores, rhinorrhea, sinus pressure, sneezing, tinnitus, trouble swallowing and voice change.   Eyes: Negative for photophobia, pain, redness and visual disturbance.  Respiratory: Negative for apnea, cough, choking, chest tightness, shortness of breath and wheezing.   Cardiovascular: Negative for chest pain, palpitations and leg swelling.  Gastrointestinal: Negative for nausea, vomiting, abdominal pain, diarrhea, constipation, blood in stool, abdominal distention, anal bleeding and rectal pain.  Genitourinary: Negative for dysuria, urgency, frequency, hematuria, flank pain, decreased urine volume, discharge, penile swelling, scrotal swelling, difficulty urinating, genital sores and testicular pain.  Musculoskeletal: Negative for arthralgias, back pain, gait problem, joint swelling, myalgias, neck pain and neck stiffness.  Skin: Negative for color change, rash and wound.  Neurological: Negative for dizziness, tremors, seizures, syncope, facial asymmetry, speech difficulty, weakness, light-headedness, numbness and headaches.    Hematological: Negative for adenopathy. Does not bruise/bleed easily.  Psychiatric/Behavioral: Positive for sleep disturbance. Negative for suicidal ideas, hallucinations, behavioral problems, confusion, self-injury, dysphoric mood, decreased concentration and agitation. The patient is not nervous/anxious.        Objective:   Physical Exam  Constitutional: He appears well-developed and well-nourished.  HENT:  Head: Normocephalic and atraumatic.  Right Ear: External ear normal.  Left Ear: External ear normal.  Nose: Nose normal.  Mouth/Throat: Oropharynx is clear and moist.  Eyes: Conjunctivae and EOM are normal. Pupils are equal, round, and reactive to light. No scleral icterus.  Neck: Normal range of motion. Neck supple. No JVD present. No thyromegaly present.  Cardiovascular: Regular rhythm, normal heart sounds and intact distal pulses.  Exam reveals no gallop and no friction rub.   No murmur heard. Pulmonary/Chest: Effort normal and breath sounds normal. He exhibits no tenderness.  Abdominal: Soft. Bowel sounds are normal. He exhibits no distension and no mass. There is no tenderness.  Genitourinary: Prostate normal and penis normal. Guaiac negative stool.  Prostate plus 3  Musculoskeletal: Normal range of motion. He exhibits no edema and no tenderness.  Lymphadenopathy:    He has no cervical adenopathy.  Neurological: He is alert. He has normal reflexes. No cranial nerve deficit. Coordination normal.  Skin: Skin is warm and dry. No rash noted.  Psychiatric: He has a normal mood and affect. His behavior is normal.          Assessment & Plan:   Preventive health examination Diabetes poor control.  Will refer to endocrinology.  Probably will be better served with a better basal and meal-time, insulin.  Clearly needs further insulin titration.  He does seem to benefit from Victoza , and states blood sugars are generally 75 mg%  higher at when he does not take this  medication  Chronic hepatitis C.  Will refer for further evaluation and management

## 2013-08-13 NOTE — Progress Notes (Signed)
Pre visit review using our clinic review tool, if applicable. No additional management support is needed unless otherwise documented below in the visit note. 

## 2013-08-28 ENCOUNTER — Ambulatory Visit (INDEPENDENT_AMBULATORY_CARE_PROVIDER_SITE_OTHER): Payer: 59 | Admitting: Endocrinology

## 2013-08-28 ENCOUNTER — Encounter: Payer: Self-pay | Admitting: Endocrinology

## 2013-08-28 ENCOUNTER — Other Ambulatory Visit: Payer: Self-pay | Admitting: *Deleted

## 2013-08-28 VITALS — BP 142/90 | HR 84 | Temp 98.0°F | Resp 14 | Ht 67.5 in | Wt 188.8 lb

## 2013-08-28 DIAGNOSIS — E1165 Type 2 diabetes mellitus with hyperglycemia: Secondary | ICD-10-CM | POA: Insufficient documentation

## 2013-08-28 DIAGNOSIS — IMO0001 Reserved for inherently not codable concepts without codable children: Secondary | ICD-10-CM

## 2013-08-28 DIAGNOSIS — IMO0002 Reserved for concepts with insufficient information to code with codable children: Secondary | ICD-10-CM | POA: Insufficient documentation

## 2013-08-28 MED ORDER — INSULIN LISPRO 100 UNIT/ML (KWIKPEN): PEN_INJECTOR | SUBCUTANEOUS | Status: DC

## 2013-08-28 MED ORDER — LIRAGLUTIDE 18 MG/3ML ~~LOC~~ SOPN: 1.8000 mg | PEN_INJECTOR | Freq: Every day | SUBCUTANEOUS | Status: DC

## 2013-08-28 MED ORDER — GLUCOSE BLOOD VI STRP: ORAL_STRIP | Status: DC

## 2013-08-28 MED ORDER — CANAGLIFLOZIN 100 MG PO TABS: 100.0000 mg | ORAL_TABLET | Freq: Every day | ORAL | Status: DC

## 2013-08-28 MED ORDER — INSULIN ISOPHANE HUMAN 100 UNIT/ML KWIKPEN: 35.0000 [IU] | PEN_INJECTOR | Freq: Every day | SUBCUTANEOUS | Status: DC

## 2013-08-28 MED ORDER — ONETOUCH DELICA LANCETS FINE MISC: Status: DC

## 2013-08-28 NOTE — Patient Instructions (Addendum)
Please check blood sugars at least half the time about 2 hours after any meal and daily on waking up. Please bring blood sugar monitor to each visit  Increase Humulin NPH to 40 units and adjust every 3 days as directed  Invokana 100 mg before breakfast daily  Try to take NovoLog consistently before meals May reduce the NovoLog by 5 units if planning to be very active May increase her NovoLog by 2-4 units if eating out or eating a larger meal  Continue Victoza

## 2013-08-28 NOTE — Progress Notes (Signed)
Patient ID: Michael Li, male   DOB: 08-29-1955, 58 y.o.   MRN: [id number]                Reason for Appointment: Consultation for Type 2 Diabetes  Referring physician: Burnice Logan  History of Present Illness:          Diagnosis: Type 2 diabetes mellitus, date of diagnosis: 2003        Past history:  Initial glucose was about 485 and he had been symptomatic with weakness and fatigue He was initially treated with metformin and glimepiride with initially reasonably good control At one point his A1c was down to 7.3% However about 2 years later his blood sugars were getting out of control and he was started on insulin He thinks he was given mealtime insulin first and then added NPH at bedtime which he has continued Victoza was added about 3 years later and he thinks glucose readings were some better with this although review of his A1c history shows no significant change over the last 4-5 years  Recent history:  He has been referred here for further management because of persistently poor control and A1c over 9% He has been on the same dose of insulin for quite sometime using NPH at bedtime and NovoLog at mealtimes He has not brought any record of his blood sugars with he thinks that they are relatively high fasting; also his lab glucose last month was 282 He is taking a fixed dose of NovoLog at each meal and does not or just at based on blood sugar level or what he is eating He does tend to forget his lunchtime dose about half the time because of his busy schedule Also find it somewhat inconvenient to take insulin at restaurants when eating out Usually quite regular with taking his NPH at bedtime He continues to take Victoza 1.8 mg without any side effects  Diet and exercise: See below Hypoglycemia: Rare with increased exercise level      Oral hypoglycemic drugs the patient is taking are: Metformin   1 g twice a day     Side effects from medications have been: None INSULIN  regimen is described as: NPH  35 hs, NovoLog 22-22-20    Glucose monitoring:  2-3 times a day        Glucometer:  True2 go.      Blood Glucose readings from meter download:  PREMEAL Breakfast Lunch Dinner pcs  Overall   Glucose range: 225   180   Median:           Glycemic control:  Lab Results  Component Value Date   HGBA1C 9.6* 08/08/2013   HGBA1C 10.5* 06/29/2012   HGBA1C 9.0* 07/25/2011   Lab Results  Component Value Date   MICROALBUR 2.6* 08/08/2013   LDLCALC 99 08/08/2013   CREATININE 0.7 08/08/2013    Self-care: The diet that the patient has been following is: None     Meals: 3 meals per day.    usually has only a toast at breakfast, at lunch he will have a sandwich or salad or burger; dinner will have meat and starch. Eating out at least once a day with restaurants like [place], [place] or [place] Exercise:  he is walking at least twice a day up to 3 miles         Dietician visit: Most recent: Never.               Retinal exam: Most recent: 2014,  normal exam.    Weight history: upto 210 in last few years Wt Readings from Last 3 Encounters:  08/28/13 188 lb 12.8 oz (85.639 kg)  08/13/13 191 lb (86.637 kg)  09/10/12 188 lb 6.4 oz (85.458 kg)      Medication List       This list is accurate as of: 08/28/13  9:58 PM.  Always use your most recent med list.               amphetamine-dextroamphetamine 30 MG 24 hr capsule  Commonly known as:  ADDERALL XR  Take 1 capsule (30 mg total) by mouth every morning.     amphetamine-dextroamphetamine 20 MG tablet  Commonly known as:  ADDERALL  Take 1 tablet (20 mg total) by mouth 3 (three) times daily.     aspirin 81 MG tablet  Take 81 mg by mouth daily.     Canagliflozin 100 MG Tabs  Commonly known as:  INVOKANA  Take 1 tablet (100 mg total) by mouth daily before breakfast.     glucose blood test strip  Commonly known as:  ONETOUCH VERIO  Use as instructed to check blood sugar 3 times per day dx code 250.00      insulin lispro 100 UNIT/ML KiwkPen  Commonly known as:  HUMALOG KWIKPEN  Inject 22 units before breakfast, 22 units before lunch and 20 units before dinner     Insulin NPH (Human) (Isophane) 100 UNIT/ML Kiwkpen  Commonly known as:  HUMULIN N KWIKPEN  Inject 35 Units into the skin at bedtime.     Insulin Pen Needle 32G X 4 MM Misc  Commonly known as:  BD PEN NEEDLE NANO U/F  1 each by Does not apply route daily as needed.     INSULIN SYRINGE .5CC/31GX5/16" 31G X 5/16" 0.5 ML Misc  Inject 1 each as directed daily.     Liraglutide 18 MG/3ML Sopn  Inject 1.8 mg into the skin daily before breakfast.     metFORMIN 1000 MG tablet  Commonly known as:  GLUCOPHAGE  TAKE 1 TABLET BY MOUTH TWICE DAILY WITH A MEAL     multivitamin with minerals Tabs tablet  Take 1 tablet by mouth daily.     omeprazole 40 MG capsule  Commonly known as:  PRILOSEC  Take 1 capsule (40 mg total) by mouth daily.     ONETOUCH DELICA LANCETS FINE Misc  Use to check blood sugar 3 times per day dx code 250.00     tadalafil 20 MG tablet  Commonly known as:  CIALIS  Take 1 tablet (20 mg total) by mouth daily as needed for erectile dysfunction.        Allergies:  Allergies  Allergen Reactions  . Cephalexin Swelling    In throat    Past Medical History  Diagnosis Date  . ATTENTION DEFICIT HYPERACTIVITY DISORDER 07/19/2006  . BENIGN PROSTATIC HYPERTROPHY 07/19/2006  . DIABETES MELLITUS, TYPE II 07/19/2006  . ERECTILE DYSFUNCTION 07/19/2006  . GERD 07/19/2006  . HEPATITIS C, CHRONIC 04/03/2007  . NEOPLASM, MALIGNANT, CARCINOMA, BASAL CELL, NOSE 10/20/2009  . Anemia     rec'd 32 units of blood, post T&A, 1981    Past Surgical History  Procedure Laterality Date  . Appendectomy    . Tonsillectomy    . Carotid injury        post tonsillectomy, R carotid injury with trach  . Tracheostomy closure  1981  . Hernia repair  1973    inguinal- R  side  . Lumbar laminectomy/decompression microdiscectomy Right 09/12/2012      Procedure: LUMBAR LAMINECTOMY/DECOMPRESSION MICRODISCECTOMY 1 LEVEL;  Surgeon: Charlie Pitter, MD;  Location: [facility];  Service: Neurosurgery;  Laterality: Right;  Right lumbar three-four microdiscectomy    Family History  Problem Relation Age of Onset  . Diabetes Maternal Aunt   . Cancer Paternal Grandmother     colon cancer  . Diabetes Maternal Aunt     Social History:  reports that he has quit smoking. He quit smokeless tobacco use about 6 years ago. His alcohol and drug histories are not on file.    Review of Systems       Lipids: Not taking any statin drugs currently       Lab Results  Component Value Date   CHOL 152 08/08/2013   HDL 37.10* 08/08/2013   LDLCALC 99 08/08/2013   TRIG 78.0 08/08/2013   CHOLHDL 4 08/08/2013              Skin: No rash or infections     Thyroid:  No  unusual fatigue.     The blood pressure has been normal without medications     No swelling of feet.     No shortness of breath on exertion.     Bowel habits: Normal, no nausea       No frequency of urination or nocturia       No joint  pains.        No history of Numbness, tingling or burning in feet   Has history of erectile dysfunction and has been prescribed Cialis     Taking Adderall for ADHD   LABS:  No visits with results within 1 Week(s) from this visit. Latest known visit with results is:  Lab on 08/08/2013  Component Date Value Ref Range Status  . Sodium 08/08/2013 137  135 - 145 mEq/L Final  . Potassium 08/08/2013 4.2  3.5 - 5.1 mEq/L Final  . Chloride 08/08/2013 104  96 - 112 mEq/L Final  . CO2 08/08/2013 28  19 - 32 mEq/L Final  . Glucose, Bld 08/08/2013 282* 70 - 99 mg/dL Final  . BUN 08/08/2013 18  6 - 23 mg/dL Final  . Creatinine, Ser 08/08/2013 0.7  0.4 - 1.5 mg/dL Final  . Calcium 08/08/2013 9.2  8.4 - 10.5 mg/dL Final  . GFR 08/08/2013 115.33  >60.00 mL/min Final  . WBC 08/08/2013 4.6  4.0 - 10.5 K/uL Final  . RBC 08/08/2013 5.00  4.22 - 5.81 Mil/uL  Final  . Hemoglobin 08/08/2013 15.6  13.0 - 17.0 g/dL Final  . HCT 08/08/2013 45.7  39.0 - 52.0 % Final  . MCV 08/08/2013 91.5  78.0 - 100.0 fl Final  . MCHC 08/08/2013 34.0  30.0 - 36.0 g/dL Final  . RDW 08/08/2013 12.8  11.5 - 15.5 % Final  . Platelets 08/08/2013 122.0* 150.0 - 400.0 K/uL Final  . Neutrophils Relative % 08/08/2013 55.8  43.0 - 77.0 % Final  . Lymphocytes Relative 08/08/2013 31.6  12.0 - 46.0 % Final  . Monocytes Relative 08/08/2013 10.9  3.0 - 12.0 % Final  . Eosinophils Relative 08/08/2013 1.4  0.0 - 5.0 % Final  . Basophils Relative 08/08/2013 0.3  0.0 - 3.0 % Final  . Neutro Abs 08/08/2013 2.6  1.4 - 7.7 K/uL Final  . Lymphs Abs 08/08/2013 1.4  0.7 - 4.0 K/uL Final  . Monocytes Absolute 08/08/2013 0.5  0.1 - 1.0 K/uL Final  .  Eosinophils Absolute 08/08/2013 0.1  0.0 - 0.7 K/uL Final  . Basophils Absolute 08/08/2013 0.0  0.0 - 0.1 K/uL Final  . Total Bilirubin 08/08/2013 1.0  0.2 - 1.2 mg/dL Final  . Bilirubin, Direct 08/08/2013 0.3  0.0 - 0.3 mg/dL Final  . Alkaline Phosphatase 08/08/2013 63  39 - 117 U/L Final  . AST 08/08/2013 90* 0 - 37 U/L Final  . ALT 08/08/2013 185* 0 - 53 U/L Final  . Total Protein 08/08/2013 6.4  6.0 - 8.3 g/dL Final  . Albumin 08/08/2013 3.8  3.5 - 5.2 g/dL Final  . Hemoglobin A1C 08/08/2013 9.6* 4.6 - 6.5 % Final   Glycemic Control Guidelines for People with Diabetes:Non Diabetic:  <6%Goal of Therapy: <7%Additional Action Suggested:  >8%   . TSH 08/08/2013 1.77  0.35 - 4.50 uIU/mL Final  . PSA 08/08/2013 0.91  0.10 - 4.00 ng/mL Final  . Color, UA 08/08/2013 yellow   Final  . Clarity, UA 08/08/2013 clear   Final  . Glucose, UA 08/08/2013 3+   Final  . Bilirubin, UA 08/08/2013 n   Final  . Ketones, UA 08/08/2013 n   Final  . Spec Grav, UA 08/08/2013 1.020   Final  . Blood, UA 08/08/2013 n   Final  . pH, UA 08/08/2013 5.5   Final  . Protein, UA 08/08/2013 n   Final  . Urobilinogen, UA 08/08/2013 0.2   Final  . Nitrite, UA 08/08/2013  n   Final  . Leukocytes, UA 08/08/2013 Negative   Final  . Microalb, Ur 08/08/2013 2.6* 0.0 - 1.9 mg/dL Final  . Creatinine,U 08/08/2013 127.4   Final  . Microalb Creat Ratio 08/08/2013 2.0  0.0 - 30.0 mg/g Final  . Cholesterol 08/08/2013 152  0 - 200 mg/dL Final   ATP III Classification       Desirable:  < 200 mg/dL               Borderline High:  200 - 239 mg/dL          High:  > = 240 mg/dL  . Triglycerides 08/08/2013 78.0  0.0 - 149.0 mg/dL Final   Normal:  <150 mg/dLBorderline High:  150 - 199 mg/dL  . HDL 08/08/2013 37.10* >39.00 mg/dL Final  . VLDL 08/08/2013 15.6  0.0 - 40.0 mg/dL Final  . LDL Cholesterol 08/08/2013 99  0 - 99 mg/dL Final  . Total CHOL/HDL Ratio 08/08/2013 4   Final                  Men          Women1/2 Average Risk     3.4          3.3Average Risk          5.0          4.42X Average Risk          9.6          7.13X Average Risk          15.0          11.0                      . NonHDL 08/08/2013 114.90   Final    Physical Examination:  BP 142/90  Pulse 84  Temp(Src) 98 F (36.7 C)  Resp 14  Ht 5' 7.5" (1.715 m)  Wt 188 lb 12.8 oz (85.639 kg)  BMI 29.12 kg/m2  SpO2 94%  GENERAL:         Patient is averagely built and nourished  HEENT:         Eye exam shows normal external appearance. Fundus exam shows no retinopathy. Oral exam shows normal mucosa .  NECK:         General:  Neck exam shows no lymphadenopathy. Carotids are normal to palpation and no bruit heard. Thyroid is not enlarged and no nodules felt.   LUNGS:         Chest is symmetrical. Lungs are clear to auscultation.Marland Kitchen   HEART:         Heart sounds:  S1 and S2 are normal. No murmurs or clicks heard., no S3 or S4.   ABDOMEN:   There is no distention present. Liver and spleen are not palpable. No other mass or tenderness present.  EXTREMITIES:     There is no edema. No skin lesions present.Marland Kitchen  NEUROLOGICAL:   Vibration sense is moderately reduced on the left and mildly on the right great toes.    Ankle jerks are 1+ bilaterally.           Diabetic foot exam shows normal monofilament sensation in the toes and plantar surfaces, no skin lesions or ulcers on the feet and normal pedal pulses MUSCULOSKELETAL:       There is no enlargement or deformity of the joints. Spine is normal to inspection.Marland Kitchen   SKIN:       No rash or lesions of concern.        ASSESSMENT:  Diabetes type 2, uncontrolled     He has had persistently poor control of his diabetes for several years despite taking basal bolus insulin regimen See history of present illness for details of current management, blood sugar patterns and difficulties with control Most recent A1c is 9.6 which is significantly high Although he thinks his blood sugars are only high in the morning around 220 with his generic monitor his lab glucose was 282 Appears not to be getting sufficient insulin for his control and may have some insulin resistance also; most of his hyperglycemia appears to be overnight  Problems identified:  Persistently high A1c for several years  Insulin resistance, requiring relatively large doses of insulin without control  Appears to have significant fasting hyperglycemia, likely to be from taking relatively lower dose of basal insulin compared to meal time coverage  Frequent noncompliance with lunchtime insulin coverage  Not clear if he has an accurate glucose monitor  May be at times eating high-fat foods when he is eating out  Has had inadequate diabetes education  Complications: None evident; likely has erectile dysfunction related to diabetes  Lipids: His LDL is less than 100 although he may benefit anyway from statin drug because of his diabetes  PLAN:   Consultation with dietitian and nurse educator  Increase nighttime NPH insulin by at least 40 units and titrate every 3 days to get fasting blood sugar at least under 140  Start using a new brand name monitor, given One Touch Verio and showed him how to  use this  Discussed timing of glucose monitoring and but sugar targets  Trial of Invokana for multiple benefits including glucose control, reducing insulin requirement, weight loss. Discussed the actions and side effects and given brochure on the medication, to start with 100 mg daily  Consider insulin pump and discussed general principles of the pump ; he will discuss this further with nurse educator and given him  a brochure on pump therapy from [employer]; also he can also look at the out-of-pocket expense if he wants to start this modality. Will need to be instructed on carbohydrate counting  Followup in 3 weeks for review of home blood sugar patterns  Total visit time including counseling = 60 minutes   [name] 08/28/2013, 9:58 PM   Note: This office note was prepared with Estate agent. Any transcriptional errors that result from this process are unintentional.

## 2013-08-29 ENCOUNTER — Telehealth: Payer: Self-pay | Admitting: Internal Medicine

## 2013-08-29 NOTE — Telephone Encounter (Signed)
Pt is requesting to speak with you regarding his insulin medicine.

## 2013-08-30 ENCOUNTER — Other Ambulatory Visit: Payer: Self-pay | Admitting: Internal Medicine

## 2013-08-30 MED ORDER — LIRAGLUTIDE 18 MG/3ML ~~LOC~~ SOPN: 1.8000 mg | PEN_INJECTOR | Freq: Every day | SUBCUTANEOUS | Status: DC

## 2013-08-30 NOTE — Telephone Encounter (Signed)
Spoke to pt, needs Rx for Victoza sent to [facility] and also sample if we have any. Told him okay will send today and will call back and let him know if we have a sample. Pt verbalized understanding.

## 2013-08-30 NOTE — Telephone Encounter (Signed)
Left detailed message do have sample of Victoza will set aside for you to pickup.

## 2013-09-16 ENCOUNTER — Telehealth: Payer: Self-pay | Admitting: Internal Medicine

## 2013-09-16 NOTE — Telephone Encounter (Addendum)
Pt request 3 mo of each of these  amphetamine-dextroamphetamine (ADDERALL XR) 30 MG 24 hr capsule  amphetamine-dextroamphetamine (ADDERALL) 20 MG tablet

## 2013-09-19 MED ORDER — AMPHETAMINE-DEXTROAMPHET ER 30 MG PO CP24: 30.0000 mg | ORAL_CAPSULE | ORAL | Status: DC

## 2013-09-19 MED ORDER — AMPHETAMINE-DEXTROAMPHETAMINE 20 MG PO TABS: 20.0000 mg | ORAL_TABLET | Freq: Three times a day (TID) | ORAL | Status: DC

## 2013-09-19 MED ORDER — AMPHETAMINE-DEXTROAMPHETAMINE 20 MG PO TABS: 20.0000 mg | ORAL_TABLET | Freq: Two times a day (BID) | ORAL | Status: DC

## 2013-09-19 NOTE — Telephone Encounter (Signed)
Spoke to pt, asked him if he had one Rx left. Pt stated no last filled July and he is out. Told pt okay will have Rx's ready today for pickup. Pt verbalized understanding. Rx's printed and signed.

## 2013-09-27 LAB — HM DIABETES EYE EXAM

## 2013-10-03 ENCOUNTER — Encounter: Payer: Self-pay | Admitting: *Deleted

## 2013-10-07 ENCOUNTER — Ambulatory Visit (INDEPENDENT_AMBULATORY_CARE_PROVIDER_SITE_OTHER): Payer: 59 | Admitting: Endocrinology

## 2013-10-07 VITALS — BP 146/88 | HR 88 | Temp 97.9°F | Resp 16 | Ht 67.5 in | Wt 196.4 lb

## 2013-10-07 DIAGNOSIS — IMO0001 Reserved for inherently not codable concepts without codable children: Secondary | ICD-10-CM

## 2013-10-07 DIAGNOSIS — E1165 Type 2 diabetes mellitus with hyperglycemia: Principal | ICD-10-CM

## 2013-10-07 NOTE — Patient Instructions (Addendum)
20 units of NovoLog at lunch Please check blood sugars at least half the time about 2 hours after any meal and 4-5 times per week on waking up. Please bring blood sugar monitor to each visit

## 2013-10-07 NOTE — Progress Notes (Signed)
Patient ID: Michael Li, male   DOB: 08/23/1955, 58 y.o.   MRN: [id number]               Reason for Appointment: Followup for Type 2 Diabetes  Referring physician: Burnice Logan  History of Present Illness:          Diagnosis: Type 2 diabetes mellitus, date of diagnosis: 2003        Past history:  Initial glucose was about 485 and he had been symptomatic with weakness and fatigue He was initially treated with metformin and glimepiride with initially reasonably good control At one point his A1c was down to 7.3% However about 2 years later his blood sugars were getting out of control and he was started on insulin He thinks he was given mealtime insulin first and then added NPH at bedtime which he has continued Victoza was added about 3 years later and he thinks glucose readings were some better with this although review of his A1c history shows no significant change over the last 4-5 years  Recent history:  He had been referred here for further management because of persistently poor control and A1c over 9% Because of relatively high fasting blood sugars of over 200 his NPH insulin was increased to 40 units and he was asked to increase it further if fasting readings were still high and he is now taking 42 units Also was started on Invokana in addition to metformin. Has tolerated this well without any side effects but surprisingly has lost lost any weight  With the above changes his blood sugars overall are improving especially fasting He feels less tired since his blood sugars are better His fasting glucose has been as low as 46 with only one hypoglycemic episode; recently fasting readings are normal to slightly increased; Highest after a large meal the night before Mealtime insulin: Is taking a relatively fixed dose of insulin before each meal without adjusting it much at lunch and dinner but will take a little less in the morning if eating less carbohydrate He has done very few  readings after meals and not clear whether patterns at the time sporadic high readings after her evening meal and once of breakfast and lunch Also he says that he is better compliant with his lunchtime insulin since his last visit He continues to take Victoza 1.8 mg without any side effects  Diet and exercise: See below Hypoglycemia: Once 46 possibly with increased exercise level the day before     Oral hypoglycemic drugs the patient is taking are: Metformin 1 g twice a day, Invokana 100 mg daily      Side effects from medications have been: None INSULIN regimen is described as: NPH 46 hs, NovoLog 18-22 am; -22 lunch-20    Glucose monitoring:  2-3 times a day        Glucometer:  Verio     Blood Glucose readings from meter download:  PREMEAL Breakfast Lunch Dinner Bedtime Overall  Glucose range:  46-266       Mean/median:  122     133   POST-MEAL PC Breakfast PC Lunch PC Dinner  Glucose range:  69-170   63-160   124-223   Mean/median:      Glycemic control:  Lab Results  Component Value Date   HGBA1C 9.6* 08/08/2013   HGBA1C 10.5* 06/29/2012   HGBA1C 9.0* 07/25/2011   Lab Results  Component Value Date   MICROALBUR 2.6* 08/08/2013   [place] 99 08/08/2013  CREATININE 0.7 08/08/2013    Self-care: The diet that the patient has been following is: None     Meals: 3 meals per day.  Egg or a toast at breakfast, at lunch he will have a sandwich or salad or burger; dinner will have meat and starch. Eating out at least once a day with restaurants like [place], [place] or [place] Exercise:  he is walking at least twice a day up to 3 miles         Dietician visit: Most recent: Never.               Retinal exam: Most recent: 2014, normal exam.    Weight history: upto 210 in last few years  Wt Readings from Last 3 Encounters:  10/07/13 196 lb 6.4 oz (89.086 kg)  08/28/13 188 lb 12.8 oz (85.639 kg)  08/13/13 191 lb (86.637 kg)      Medication List       This list is accurate as of:  10/07/13  3:31 PM.  Always use your most recent med list.               amphetamine-dextroamphetamine 30 MG 24 hr capsule  Commonly known as:  ADDERALL XR  Take 1 capsule (30 mg total) by mouth every morning.     amphetamine-dextroamphetamine 20 MG tablet  Commonly known as:  ADDERALL  Take 1 tablet (20 mg total) by mouth 3 (three) times daily.     aspirin 81 MG tablet  Take 81 mg by mouth daily.     Canagliflozin 100 MG Tabs  Commonly known as:  INVOKANA  Take 1 tablet (100 mg total) by mouth daily before breakfast.     glucose blood test strip  Commonly known as:  ONETOUCH VERIO  Use as instructed to check blood sugar 3 times per day dx code 250.00     insulin lispro 100 UNIT/ML KiwkPen  Commonly known as:  HUMALOG KWIKPEN  Inject 22 units before breakfast, 22 units before lunch and 20 units before dinner     Insulin NPH (Human) (Isophane) 100 UNIT/ML Kiwkpen  Commonly known as:  HUMULIN N  Inject 42 Units into the skin at bedtime.     Insulin Pen Needle 32G X 4 MM Misc  Commonly known as:  BD PEN NEEDLE NANO U/F  1 each by Does not apply route daily as needed.     INSULIN SYRINGE .5CC/31GX5/16" 31G X 5/16" 0.5 ML Misc  Inject 1 each as directed daily.     Liraglutide 18 MG/3ML Sopn  Inject 1.8 mg into the skin daily before breakfast.     metFORMIN 1000 MG tablet  Commonly known as:  GLUCOPHAGE  TAKE 1 TABLET BY MOUTH TWICE DAILY WITH A MEAL     multivitamin with minerals Tabs tablet  Take 1 tablet by mouth daily.     omeprazole 40 MG capsule  Commonly known as:  PRILOSEC  Take 1 capsule (40 mg total) by mouth daily.     ONETOUCH DELICA LANCETS FINE Misc  Use to check blood sugar 3 times per day dx code 250.00     tadalafil 20 MG tablet  Commonly known as:  CIALIS  Take 1 tablet (20 mg total) by mouth daily as needed for erectile dysfunction.        Allergies:  Allergies  Allergen Reactions  . Cephalexin Swelling    In throat    Past Medical  History  Diagnosis Date  . ATTENTION  DEFICIT HYPERACTIVITY DISORDER 07/19/2006  . BENIGN PROSTATIC HYPERTROPHY 07/19/2006  . DIABETES MELLITUS, TYPE II 07/19/2006  . ERECTILE DYSFUNCTION 07/19/2006  . GERD 07/19/2006  . HEPATITIS C, CHRONIC 04/03/2007  . NEOPLASM, MALIGNANT, CARCINOMA, BASAL CELL, NOSE 10/20/2009  . Anemia     rec'd 32 units of blood, post T&A, 1981    Past Surgical History  Procedure Laterality Date  . Appendectomy    . Tonsillectomy    . Carotid injury        post tonsillectomy, R carotid injury with trach  . Tracheostomy closure  1981  . Hernia repair  1973    inguinal- R side  . Lumbar laminectomy/decompression microdiscectomy Right 09/12/2012    Procedure: LUMBAR LAMINECTOMY/DECOMPRESSION MICRODISCECTOMY 1 LEVEL;  Surgeon: Charlie Pitter, MD;  Location: [facility];  Service: Neurosurgery;  Laterality: Right;  Right lumbar three-four microdiscectomy    Family History  Problem Relation Age of Onset  . Diabetes Maternal Aunt   . Cancer Paternal Grandmother     colon cancer  . Diabetes Maternal Aunt     Social History:  reports that he has quit smoking. He quit smokeless tobacco use about 6 years ago. His alcohol and drug histories are not on file.    Review of Systems       Lipids: Not taking any statin drugs currently       Lab Results  Component Value Date   CHOL 152 08/08/2013   HDL 37.10* 08/08/2013   LDLCALC 99 08/08/2013   TRIG 78.0 08/08/2013   CHOLHDL 4 08/08/2013          No history of Numbness, tingling or burning in feet   Has history of erectile dysfunction and has been prescribed Cialis     Taking Adderall for ADHD  Diabetic Complications: None evident; likely has erectile dysfunction related to diabetes   LABS:  Abstract on 10/03/2013  Component Date Value Ref Range Status  . HM Diabetic Eye Exam 09/27/2013 No Retinopathy  No Retinopathy Final    Physical Examination:  BP 146/88  Pulse 88  Temp(Src) 97.9 F (36.6 C)  Resp 16  Ht 5'  7.5" (1.715 m)  Wt 196 lb 6.4 oz (89.086 kg)  BMI 30.29 kg/m2  SpO2 96%         ASSESSMENT:  Diabetes type 2, uncontrolled     See history of present illness for details of current management, blood sugar patterns and difficulties with control In 6/15 his A1c was 9.6 and since his consultation he has been on a regimen of Invokana along with increased doses of bedtime NPH insulin  With this his blood sugar control is significantly better especially fasting readings His average glucose in the morning at about 135 with some variability probably related to using NPH insulin, variable diet the night before and exercise level Objectively no lab work is available as yet and A1c will be checked on the next visit  PLAN:   Consultation with dietitian  Continue same regimen except 2 units less for lunchtime coverage  More blood sugars after meals  Discussed blood sugar targets, insulin adjustment for more NPH and mealtime insulin  Continuing Invokana, metformin and the dose unchanged  Consider insulin pump on his next visit, he wants to wait   [facility] 10/07/2013, 3:31 PM   Note: This office note was prepared with Dragon voice recognition system technology. Any transcriptional errors that result from this process are unintentional.

## 2013-10-11 ENCOUNTER — Ambulatory Visit: Payer: 59 | Admitting: Dietician

## 2013-10-14 ENCOUNTER — Encounter: Payer: Self-pay | Admitting: Internal Medicine

## 2013-10-14 ENCOUNTER — Encounter: Payer: 59 | Attending: Internal Medicine | Admitting: *Deleted

## 2013-10-14 VITALS — Ht 69.0 in | Wt 196.6 lb

## 2013-10-14 DIAGNOSIS — IMO0001 Reserved for inherently not codable concepts without codable children: Secondary | ICD-10-CM | POA: Diagnosis not present

## 2013-10-14 DIAGNOSIS — Z713 Dietary counseling and surveillance: Secondary | ICD-10-CM | POA: Insufficient documentation

## 2013-10-14 DIAGNOSIS — Z794 Long term (current) use of insulin: Secondary | ICD-10-CM | POA: Diagnosis not present

## 2013-10-14 DIAGNOSIS — E1165 Type 2 diabetes mellitus with hyperglycemia: Principal | ICD-10-CM

## 2013-10-14 NOTE — Progress Notes (Signed)
Medical Nutrition Therapy:  Appt start time: 0800 end time:  0900.  Assessment:  He is interested in learning more about how to balance his meals to improve his blood sugar control. He does his own cooking and shopping. Physical activity; walking dog twice a day and going to join gym soon. He tests his blood sugars 3-4 times per day. FBS was 99 mg/dl this am. BS ranges since going on Invokana have improved to 99- - 140 mg/dl before or 2 hours after meals. Usually eats 2-3 meals per day. Admits to portion control as a problem. Usually eats healthy home prepared meals.  MEDICATIONS: See list below   DIETARY INTAKE:   Usual eating pattern includes 2 meals and 1 snacks per day at HS.  24-hr recall:  B ( AM): PB sandwich with coffeee Snk ( AM): L ( PM): Subway BMT 6" on wheat; Diet coke and now going to water  Snk ( PM):   D ( PM): Tomato Soup 1 cup, Grilled cheese sandwich sandwich with bacon-2 slices, butter 1-2 of butter; mayo also;1 tsp Snk ( PM): Pack of nabs, or popcorn  Beverages: water, diet coke, coffee-2 cups  Usual physical activity: Walking dog and yard work.  Estimated energy needs: 2000 calories 225 g carbohydrates 125 g protein 56 g fat  Progress Towards Goal(s):  In progress.   Nutritional Diagnosis:  NB-1.1 Food and nutrition-related knowledge deficit As related to diabetes.  As evidenced by A1C of > 9%.    Intervention:  Nutrition Nutritional counseling, carb counting,  Label reading,meal planning, target ranges for blood sugars and benefits of exercise for improved blood sugar control and weight management.  Goals: 1. Pt will eat 3-4 carb choices per meal and 1 carb choice for snack as needed and before bed. 2. He will start reading food labels and measure foods out for portion control. 3. Will start working out a few times per week at the [employer]. 4. Get A1C down to 7%. 6. Talk to PCP about the possibility of an insulin pump.  Handouts given during visit  include:  The Living Well With Diabetes Booklet   The Plate Method Handout  Meal planning guide  Portion Card  Monitoring/Evaluation:  Dietary intake, exercise, testing blood sugars and A1C, and body weight prn.

## 2013-10-15 ENCOUNTER — Other Ambulatory Visit: Payer: Self-pay | Admitting: Nurse Practitioner

## 2013-10-15 DIAGNOSIS — C220 Liver cell carcinoma: Secondary | ICD-10-CM

## 2013-10-31 ENCOUNTER — Ambulatory Visit
Admission: RE | Admit: 2013-10-31 | Discharge: 2013-10-31 | Disposition: A | Discharge status: Home / Self Care | Payer: 59 | Source: Ambulatory Visit | Attending: Nurse Practitioner | Admitting: Nurse Practitioner

## 2013-10-31 ENCOUNTER — Other Ambulatory Visit: Payer: Self-pay | Admitting: Nurse Practitioner

## 2013-10-31 DIAGNOSIS — C220 Liver cell carcinoma: Secondary | ICD-10-CM

## 2013-12-04 ENCOUNTER — Other Ambulatory Visit (INDEPENDENT_AMBULATORY_CARE_PROVIDER_SITE_OTHER): Payer: 59

## 2013-12-04 DIAGNOSIS — E1165 Type 2 diabetes mellitus with hyperglycemia: Secondary | ICD-10-CM

## 2013-12-04 DIAGNOSIS — IMO0002 Reserved for concepts with insufficient information to code with codable children: Secondary | ICD-10-CM

## 2013-12-04 LAB — BASIC METABOLIC PANEL
BUN: 21 mg/dL (ref 6–23)
CO2: 22 mEq/L (ref 19–32)
Calcium: 9.5 mg/dL (ref 8.4–10.5)
Chloride: 105 mEq/L (ref 96–112)
Creatinine, Ser: 1.2 mg/dL (ref 0.4–1.5)
GFR: 68.58 mL/min (ref >60.00)
Glucose, Bld: 141 mg/dL — ABNORMAL HIGH (ref 70–99)
Potassium: 4.3 mEq/L (ref 3.5–5.1)
Sodium: 138 mEq/L (ref 135–145)

## 2013-12-04 LAB — HEMOGLOBIN A1C: Hgb A1c MFr Bld: 6 % (ref 4.6–6.5)

## 2013-12-09 ENCOUNTER — Encounter: Payer: Self-pay | Admitting: Endocrinology

## 2013-12-09 ENCOUNTER — Ambulatory Visit (INDEPENDENT_AMBULATORY_CARE_PROVIDER_SITE_OTHER): Payer: 59 | Admitting: Endocrinology

## 2013-12-09 VITALS — BP 140/85 | HR 88 | Temp 98.0°F | Resp 14 | Ht 67.5 in | Wt 194.0 lb

## 2013-12-09 DIAGNOSIS — E119 Type 2 diabetes mellitus without complications: Secondary | ICD-10-CM

## 2013-12-09 NOTE — Progress Notes (Signed)
Patient ID: Michael Li, male   DOB: Sep 03, 1955, 58 y.o.   MRN: [id number]               Reason for Appointment: Followup for Type 2 Diabetes  Referring physician: Burnice Logan  History of Present Illness:          Diagnosis: Type 2 diabetes mellitus, date of diagnosis: 2003        Past history:  Initial glucose was about 485 and he had been symptomatic with weakness and fatigue He was initially treated with metformin and glimepiride with initially reasonably good control At one point his A1c was down to 7.3% However about 2 years later his blood sugars were getting out of control and he was started on insulin He thinks he was given mealtime insulin first and then added NPH at bedtime which he has continued Victoza was added about 3 years later and he thinks glucose readings were some better with this although review of his A1c history shows no significant change over the last 4-5 years  Recent history:  Since his last visit he has taken about the same amount of NPH insulin at bedtime, taking 38-42 units He is adjusting it somewhat based on his evening meal He was started on Invokana in addition to metformin in 7/15.  Has tolerated Invokana well  Although his blood sugars had improved somewhat on his first followup he still had some fluctuation in glucose readings along with some hypoglycemia However since his last visit his blood sugars appear to be more consistent and generally fairly good He has however check his blood sugars somewhat infrequently especially non-fasting  Mealtime insulin: Is taking somewhat arbitrary dose of insulin before each meal and is not trying to adjust it based on his meal size He has a couple of postprandial readings and evenings which are excellent but none after lunch. He was reduce his mealtime doses more when he is active on weekends Highest reading 183 after breakfast He continues to take Victoza 1.8 mg without any side effects  His A1c is  not the best in a long time Hypoglycemia: None     Oral hypoglycemic drugs the patient is taking are: Metformin 1 g twice a day, Invokana 100 mg daily      Side effects from medications have been: None INSULIN regimen is described as: NPH 38-42 hs, NovoLog 18-22 am; -22 lunch-20    Glucose monitoring:  2-3 times a day        Glucometer:  Verio     Blood Glucose readings from meter download:  PREMEAL Breakfast Lunch Dinner Bedtime Overall  Glucose range:  94- 174  87-118   94, 132   110-136    Mean/median:      126      Glycemic control:  Lab Results  Component Value Date   HGBA1C 6.0 12/04/2013   HGBA1C 9.6* 08/08/2013   HGBA1C 10.5* 06/29/2012   Lab Results  Component Value Date   MICROALBUR 2.6* 08/08/2013   LDLCALC 99 08/08/2013   CREATININE 1.2 12/04/2013    Self-care: The diet that the patient has been following is: None     Meals: 3 meals per day.  Egg or a toast at breakfast, at lunch he will have a sandwich or salad or burger; dinner will have meat and starch. Eating out at least once a day with restaurants like [place], [place] or [place] Exercise:  he is walking at least twice a day up to 3  miles         Dietician visit: Most recent: Never.               Retinal exam: Most recent: 2014, normal exam.    Weight history: upto 210 in last few years  Wt Readings from Last 3 Encounters:  12/09/13 194 lb (87.998 kg)  10/14/13 196 lb 9.6 oz (89.177 kg)  10/07/13 196 lb 6.4 oz (89.086 kg)      Medication List       This list is accurate as of: 12/09/13  4:51 PM.  Always use your most recent med list.               amphetamine-dextroamphetamine 30 MG 24 hr capsule  Commonly known as:  ADDERALL XR  Take 1 capsule (30 mg total) by mouth every morning.     amphetamine-dextroamphetamine 20 MG tablet  Commonly known as:  ADDERALL  Take 1 tablet (20 mg total) by mouth 3 (three) times daily.     aspirin 81 MG tablet  Take 81 mg by mouth daily.     canagliflozin  100 MG Tabs tablet  Commonly known as:  INVOKANA  Take 1 tablet (100 mg total) by mouth daily before breakfast.     glucose blood test strip  Commonly known as:  ONETOUCH VERIO  Use as instructed to check blood sugar 3 times per day dx code 250.00     insulin lispro 100 UNIT/ML KiwkPen  Commonly known as:  HUMALOG KWIKPEN  Inject 22 units before breakfast, 22 units before lunch and 20 units before dinner     Insulin NPH (Human) (Isophane) 100 UNIT/ML Kiwkpen  Commonly known as:  HUMULIN N  Inject 42 Units into the skin at bedtime.     Insulin Pen Needle 32G X 4 MM Misc  Commonly known as:  BD PEN NEEDLE NANO U/F  1 each by Does not apply route daily as needed.     INSULIN SYRINGE .5CC/31GX5/16" 31G X 5/16" 0.5 ML Misc  Inject 1 each as directed daily.     Liraglutide 18 MG/3ML Sopn  Inject 1.8 mg into the skin daily before breakfast.     metFORMIN 1000 MG tablet  Commonly known as:  GLUCOPHAGE  TAKE 1 TABLET BY MOUTH TWICE DAILY WITH A MEAL     multivitamin with minerals Tabs tablet  Take 1 tablet by mouth daily.     omeprazole 40 MG capsule  Commonly known as:  PRILOSEC  Take 1 capsule (40 mg total) by mouth daily.     ONETOUCH DELICA LANCETS FINE Misc  Use to check blood sugar 3 times per day dx code 250.00     tadalafil 20 MG tablet  Commonly known as:  CIALIS  Take 1 tablet (20 mg total) by mouth daily as needed for erectile dysfunction.        Allergies:  Allergies  Allergen Reactions  . Cephalexin Swelling    In throat    Past Medical History  Diagnosis Date  . ATTENTION DEFICIT HYPERACTIVITY DISORDER 07/19/2006  . BENIGN PROSTATIC HYPERTROPHY 07/19/2006  . DIABETES MELLITUS, TYPE II 07/19/2006  . ERECTILE DYSFUNCTION 07/19/2006  . GERD 07/19/2006  . HEPATITIS C, CHRONIC 04/03/2007  . NEOPLASM, MALIGNANT, CARCINOMA, BASAL CELL, NOSE 10/20/2009  . Anemia     rec'd 32 units of blood, post T&A, 1981    Past Surgical History  Procedure Laterality Date  .  Appendectomy    . Tonsillectomy    .  Carotid injury        post tonsillectomy, R carotid injury with trach  . Tracheostomy closure  1981  . Hernia repair  1973    inguinal- R side  . Lumbar laminectomy/decompression microdiscectomy Right 09/12/2012    Procedure: LUMBAR LAMINECTOMY/DECOMPRESSION MICRODISCECTOMY 1 LEVEL;  Surgeon: Charlie Pitter, MD;  Location: [facility];  Service: Neurosurgery;  Laterality: Right;  Right lumbar three-four microdiscectomy    Family History  Problem Relation Age of Onset  . Diabetes Maternal Aunt   . Cancer Paternal Grandmother     colon cancer  . Diabetes Maternal Aunt     Social History:  reports that he has quit smoking. He quit smokeless tobacco use about 6 years ago. His alcohol and drug histories are not on file. No alcohol   Review of Systems       Lipids: Not taking any statin drugs currently       Lab Results  Component Value Date   CHOL 152 08/08/2013   HDL 37.10* 08/08/2013   LDLCALC 99 08/08/2013   TRIG 78.0 08/08/2013   CHOLHDL 4 08/08/2013          No history of Numbness, tingling or burning in feet   Has history of erectile dysfunction and has been prescribed Cialis     Taking Adderall for ADHD  Diabetic Complications: None evident; likely has erectile dysfunction related to diabetes   LABS:  Appointment on 12/04/2013  Component Date Value Ref Range Status  . Hemoglobin A1C 12/04/2013 6.0  4.6 - 6.5 % Final   Glycemic Control Guidelines for People with Diabetes:Non Diabetic:  <6%Goal of Therapy: <7%Additional Action Suggested:  >8%   . Sodium 12/04/2013 138  135 - 145 mEq/L Final  . Potassium 12/04/2013 4.3  3.5 - 5.1 mEq/L Final  . Chloride 12/04/2013 105  96 - 112 mEq/L Final  . CO2 12/04/2013 22  19 - 32 mEq/L Final  . Glucose, Bld 12/04/2013 141* 70 - 99 mg/dL Final  . BUN 12/04/2013 21  6 - 23 mg/dL Final  . Creatinine, Ser 12/04/2013 1.2  0.4 - 1.5 mg/dL Final  . Calcium 12/04/2013 9.5  8.4 - 10.5 mg/dL Final  .  GFR 12/04/2013 68.58  >60.00 mL/min Final    Physical Examination:  BP 140/85  Pulse 88  Temp(Src) 98 F (36.7 C)  Resp 14  Ht 5' 7.5" (1.715 m)  Wt 194 lb (87.998 kg)  BMI 29.92 kg/m2  SpO2 97%        Repeat Blood Pressure 135/80  ASSESSMENT/PLAN  Diabetes type 2, uncontrolled     See history of present illness for details of current management, blood sugar patterns and difficulties with control He has been on a regimen of Invokana along with increased doses of bedtime NPH insulin With this and his trying to do better with adjusting his mealtime doses he has improved his control significantly A1c is now upper normal which is the best in a while  Also recently his glucoses are not fluctuating as much   Discussed need to check his blood sugars more consistently especially after meals  Also needs to keep a steady dose of bedtime NPH and adjust only based on morning sugars instead of evening meal unless eating a high fat meal or pizza   Michael Li 12/09/2013, 4:51 PM   Note: This office note was prepared with Estate agent. Any transcriptional errors that result from this process are unintentional.

## 2013-12-09 NOTE — Patient Instructions (Signed)
Adjust N insulin based on fasting sugars  Please check blood sugars at least half the time about 2 hours after any meal and 3 times per week on waking up. Please bring blood sugar monitor to each visit

## 2013-12-16 ENCOUNTER — Telehealth: Payer: Self-pay | Admitting: Internal Medicine

## 2013-12-16 MED ORDER — AMPHETAMINE-DEXTROAMPHETAMINE 20 MG PO TABS: 20.0000 mg | ORAL_TABLET | Freq: Three times a day (TID) | ORAL | Status: DC

## 2013-12-16 MED ORDER — AMPHETAMINE-DEXTROAMPHET ER 30 MG PO CP24: 30.0000 mg | ORAL_CAPSULE | ORAL | Status: DC

## 2013-12-16 NOTE — Telephone Encounter (Signed)
Left message on voicemail to call office.  

## 2013-12-16 NOTE — Telephone Encounter (Signed)
Pt called back, told him Rx's ready for pickup and I do have a sample of Victozia for him. Pt verbalized understanding and will come by tomorrow.

## 2013-12-16 NOTE — Telephone Encounter (Signed)
Pt need re-fills on amphetamine-dextroamphetamine (ADDERALL) 20 MG tablet and amphetamine-dextroamphetamine (ADDERALL XR) 30 MG 24 hr capsule.  He also would like to know if he can have a sample of Victoza to hold him over till his mail order arrives??

## 2013-12-21 ENCOUNTER — Other Ambulatory Visit: Payer: Self-pay | Admitting: Internal Medicine

## 2013-12-30 ENCOUNTER — Other Ambulatory Visit: Payer: Self-pay | Admitting: Endocrinology

## 2014-01-07 ENCOUNTER — Other Ambulatory Visit: Payer: Self-pay | Admitting: Internal Medicine

## 2014-01-07 DIAGNOSIS — IMO0002 Reserved for concepts with insufficient information to code with codable children: Secondary | ICD-10-CM

## 2014-01-07 DIAGNOSIS — E1165 Type 2 diabetes mellitus with hyperglycemia: Secondary | ICD-10-CM

## 2014-01-07 MED ORDER — INSULIN PEN NEEDLE 32G X 4 MM MISC: Status: DC

## 2014-01-07 MED ORDER — METFORMIN HCL 1000 MG PO TABS: ORAL_TABLET | ORAL | Status: DC

## 2014-01-07 NOTE — Telephone Encounter (Signed)
Rx sent to [facility]

## 2014-01-07 NOTE — Telephone Encounter (Signed)
[facility] is requesting re-fill on metFORMIN (GLUCOPHAGE) 1000 MG tablet and BD PEN NEEDLE NANO U/F 32G X 4 MM MISC

## 2014-03-06 ENCOUNTER — Telehealth: Payer: Self-pay | Admitting: Endocrinology

## 2014-03-06 ENCOUNTER — Telehealth: Payer: Self-pay | Admitting: Internal Medicine

## 2014-03-06 MED ORDER — AMPHETAMINE-DEXTROAMPHET ER 30 MG PO CP24: 30.0000 mg | ORAL_CAPSULE | ORAL | Status: DC

## 2014-03-06 MED ORDER — AMPHETAMINE-DEXTROAMPHETAMINE 20 MG PO TABS: 20.0000 mg | ORAL_TABLET | Freq: Three times a day (TID) | ORAL | Status: DC

## 2014-03-06 MED ORDER — INSULIN LISPRO 100 UNIT/ML (KWIKPEN): PEN_INJECTOR | SUBCUTANEOUS | Status: DC

## 2014-03-06 NOTE — Telephone Encounter (Signed)
Rx sent to [facility]

## 2014-03-06 NOTE — Telephone Encounter (Signed)
Pt request refill of the following: amphetamine-dextroamphetamine (ADDERALL XR) 30 MG 24 hr capsule, amphetamine-dextroamphetamine (ADDERALL) 20 MG tablet   Pt ordered the wrong insulin and it will be a while before he receives it and is asking if you have any samples of HUMALOG will  need to pick sample today      Phamacy:

## 2014-03-06 NOTE — Telephone Encounter (Signed)
Pt left message regarding his Humalog he is almost out and needs refill  [facility] [place] called and LM for pt to ask where he needs his meds sent in to   We do not have any samples

## 2014-03-06 NOTE — Telephone Encounter (Signed)
Spoke to pt, told him Rx are ready for pickup and I have a sample of Humalog for you also. Pt verbalized understanding. Rx printed and signed.

## 2014-03-27 ENCOUNTER — Other Ambulatory Visit: Payer: Self-pay | Admitting: Dermatology

## 2014-04-04 ENCOUNTER — Other Ambulatory Visit: Payer: Self-pay | Admitting: Internal Medicine

## 2014-04-04 MED ORDER — INSULIN LISPRO 100 UNIT/ML (KWIKPEN): PEN_INJECTOR | SUBCUTANEOUS | Status: DC

## 2014-04-04 MED ORDER — LIRAGLUTIDE 18 MG/3ML ~~LOC~~ SOPN: 1.8000 mg | PEN_INJECTOR | Freq: Every day | SUBCUTANEOUS | Status: DC

## 2014-04-04 NOTE — Telephone Encounter (Signed)
Pt needs samples of humalog kwik pen and victoza. Pt does have endocrinologist dr Dwyane Dee however he is waiting on mailorder

## 2014-04-04 NOTE — Telephone Encounter (Signed)
Called and spoke with pt and pt states he does not have enough insulin for the weekend. Called and spoke with pt and pt is aware insulin ready for pick up

## 2014-04-07 ENCOUNTER — Other Ambulatory Visit: Payer: Self-pay | Admitting: *Deleted

## 2014-04-07 ENCOUNTER — Telehealth: Payer: Self-pay | Admitting: Endocrinology

## 2014-04-07 MED ORDER — INSULIN ISOPHANE HUMAN 100 UNIT/ML KWIKPEN: 42.0000 [IU] | PEN_INJECTOR | Freq: Every day | SUBCUTANEOUS | Status: DC

## 2014-04-07 MED ORDER — LIRAGLUTIDE 18 MG/3ML ~~LOC~~ SOPN: 1.8000 mg | PEN_INJECTOR | Freq: Every day | SUBCUTANEOUS | Status: DC

## 2014-04-07 MED ORDER — INSULIN LISPRO 200 UNIT/ML ~~LOC~~ SOPN: 64.0000 [IU] | PEN_INJECTOR | Freq: Three times a day (TID) | SUBCUTANEOUS | Status: DC

## 2014-04-07 NOTE — Telephone Encounter (Signed)
Patient wants to know if he can get the Humalog 200 unit instead of the 100 unit, please advise

## 2014-04-07 NOTE — Telephone Encounter (Signed)
Patient would like for rhonda to please call him     Thank you   Call back: [phone]

## 2014-04-07 NOTE — Telephone Encounter (Signed)
Yes

## 2014-04-11 MED ORDER — INSULIN LISPRO 200 UNIT/ML ~~LOC~~ SOPN: 64.0000 [IU] | PEN_INJECTOR | Freq: Three times a day (TID) | SUBCUTANEOUS | Status: DC

## 2014-04-11 NOTE — Telephone Encounter (Signed)
Refill has been sent.  °

## 2014-04-15 ENCOUNTER — Other Ambulatory Visit: Payer: Self-pay | Admitting: *Deleted

## 2014-04-15 DIAGNOSIS — E1165 Type 2 diabetes mellitus with hyperglycemia: Secondary | ICD-10-CM

## 2014-04-15 DIAGNOSIS — IMO0002 Reserved for concepts with insufficient information to code with codable children: Secondary | ICD-10-CM

## 2014-04-15 MED ORDER — INSULIN PEN NEEDLE 32G X 4 MM MISC: Status: DC

## 2014-04-17 ENCOUNTER — Other Ambulatory Visit: Payer: Self-pay | Admitting: *Deleted

## 2014-04-17 DIAGNOSIS — IMO0002 Reserved for concepts with insufficient information to code with codable children: Secondary | ICD-10-CM

## 2014-04-17 DIAGNOSIS — E1165 Type 2 diabetes mellitus with hyperglycemia: Secondary | ICD-10-CM

## 2014-04-17 MED ORDER — INSULIN PEN NEEDLE 32G X 4 MM MISC: Status: DC

## 2014-04-28 ENCOUNTER — Other Ambulatory Visit (INDEPENDENT_AMBULATORY_CARE_PROVIDER_SITE_OTHER): Payer: 59

## 2014-04-28 DIAGNOSIS — E119 Type 2 diabetes mellitus without complications: Secondary | ICD-10-CM

## 2014-04-28 LAB — COMPREHENSIVE METABOLIC PANEL
ALT: 26 U/L (ref 0–53)
AST: 26 U/L (ref 0–37)
Albumin: 4.3 g/dL (ref 3.5–5.2)
Alkaline Phosphatase: 69 U/L (ref 39–117)
BUN: 23 mg/dL (ref 6–23)
CO2: 27 mEq/L (ref 19–32)
Calcium: 9.6 mg/dL (ref 8.4–10.5)
Chloride: 102 mEq/L (ref 96–112)
Creatinine, Ser: 0.9 mg/dL (ref 0.40–1.50)
GFR: 91.78 mL/min (ref >60.00)
Glucose, Bld: 127 mg/dL — ABNORMAL HIGH (ref 70–99)
Potassium: 4.2 mEq/L (ref 3.5–5.1)
Sodium: 135 mEq/L (ref 135–145)
Total Bilirubin: 0.4 mg/dL (ref 0.2–1.2)
Total Protein: 7.2 g/dL (ref 6.0–8.3)

## 2014-04-28 LAB — HEMOGLOBIN A1C: Hgb A1c MFr Bld: 5.9 % (ref 4.6–6.5)

## 2014-05-02 ENCOUNTER — Other Ambulatory Visit: Payer: Self-pay | Admitting: Endocrinology

## 2014-05-04 ENCOUNTER — Other Ambulatory Visit: Payer: Self-pay | Admitting: Endocrinology

## 2014-05-05 ENCOUNTER — Encounter: Payer: Self-pay | Admitting: Endocrinology

## 2014-05-05 ENCOUNTER — Ambulatory Visit (INDEPENDENT_AMBULATORY_CARE_PROVIDER_SITE_OTHER): Payer: 59 | Admitting: Endocrinology

## 2014-05-05 VITALS — BP 170/82 | HR 89 | Temp 98.0°F | Resp 14 | Ht 67.5 in | Wt 198.4 lb

## 2014-05-05 DIAGNOSIS — E119 Type 2 diabetes mellitus without complications: Secondary | ICD-10-CM

## 2014-05-05 NOTE — Progress Notes (Signed)
Patient ID: CARDEN TEEL, male   DOB: August 06, 1955, 59 y.o.   MRN: [id number]               Reason for Appointment: Followup for Type 2 Diabetes  Referring physician: Burnice Logan  History of Present Illness:          Diagnosis: Type 2 diabetes mellitus, date of diagnosis: 2003        Past history:  Initial glucose was about 485 and he had been symptomatic with weakness and fatigue He was initially treated with metformin and glimepiride with initially reasonably good control At one point his A1c was down to 7.3% However about 2 years later his blood sugars were getting out of control and he was started on insulin He thinks he was given mealtime insulin first and then added NPH at bedtime which he has continued Victoza was added about 3 years later and he thinks glucose readings were some better with this although review of his A1c history shows no significant change over the last 4-5 years  Recent history:  He was started on Invokana in addition to metformin and Victoza in 7/15.     He is also taking his bedtime NPH insulin along with mealtime NovoLog  A1c again is quite normal  However he is checking his blood sugars mostly in the mornings and some at lunch and rarely in the evenings  current blood sugar patterns:   fasting readings are fairly consistent with only sporadic high readings of 150-170    blood sugars are usually fairly good at lunch also with only  One low blood sugar of 63    he thinks he may tend to have some hypoglycemia in the afternoon with lowest reading 36 which occurred at work, not clear whether he overestimating his lunchtime dose   Has only rare blood sugars after supper and difficult to get a pattern  He thinks he may get low blood sugars overnight at least once a week but has not reduced his NPH His weight has gone up slightly     Oral hypoglycemic drugs the patient is taking are: Metformin 1 g twice a day, Invokana 100 mg daily      Side effects  from medications have been: None INSULIN regimen is described as: NPH 38-42 hs, NovoLog 18-22  breakfast; 22 lunch-20  supper   Glucose monitoring:  2-3 times a day        Glucometer:  Verio     Blood Glucose readings from meter download:  PRE-MEAL Breakfast Lunch Dinner Bedtime Overall  Glucose range:  77-174  63-197 91 , 148     average:      131   POST-MEAL PC Breakfast PC Lunch PC Dinner  Glucose range:   36-100  196, 124  Mean/median:       Glycemic control:  Lab Results  Component Value Date   HGBA1C 5.9 04/28/2014   HGBA1C 6.0 12/04/2013   HGBA1C 9.6* 08/08/2013   Lab Results  Component Value Date   MICROALBUR 2.6* 08/08/2013   LDLCALC 99 08/08/2013   CREATININE 0.90 04/28/2014    Self-care: The diet that the patient has been following is: None     Meals: 3 meals per day.  Egg or a toast at breakfast, at lunch he will have a sandwich or salad or burger; dinner will have meat and starch. Eating out at least once a day with restaurants like [place], [place] or [place] Exercise:  he is walking  at least twice a day up to 3 miles         Dietician visit: Most recent: Never.               Retinal exam: Most recent: 2014, normal exam.    Weight history: upto 210 in last few years  Wt Readings from Last 3 Encounters:  05/05/14 198 lb 6.4 oz (89.994 kg)  12/09/13 194 lb (87.998 kg)  10/14/13 196 lb 9.6 oz (89.177 kg)      Medication List       This list is accurate as of: 05/05/14  4:43 PM.  Always use your most recent med list.               amphetamine-dextroamphetamine 30 MG 24 hr capsule  Commonly known as:  ADDERALL XR  Take 1 capsule (30 mg total) by mouth every morning.     amphetamine-dextroamphetamine 30 MG 24 hr capsule  Commonly known as:  ADDERALL XR  Take 1 capsule (30 mg total) by mouth every morning.     amphetamine-dextroamphetamine 30 MG 24 hr capsule  Commonly known as:  ADDERALL XR  Take 1 capsule (30 mg total) by mouth every morning.       amphetamine-dextroamphetamine 20 MG tablet  Commonly known as:  ADDERALL  Take 1 tablet (20 mg total) by mouth 3 (three) times daily.     amphetamine-dextroamphetamine 20 MG tablet  Commonly known as:  ADDERALL  Take 1 tablet (20 mg total) by mouth 3 (three) times daily.     amphetamine-dextroamphetamine 20 MG tablet  Commonly known as:  ADDERALL  Take 1 tablet (20 mg total) by mouth 3 (three) times daily.     aspirin 81 MG tablet  Take 81 mg by mouth daily.     glucose blood test strip  Commonly known as:  ONETOUCH VERIO  Use as instructed to check blood sugar 3 times per day dx code 250.00     HARVONI 90-400 MG Tabs  Generic drug:  Ledipasvir-Sofosbuvir     ibuprofen 800 MG tablet  Commonly known as:  ADVIL,MOTRIN     Insulin Lispro (Human) 200 UNIT/ML Sopn  Commonly known as:  HUMALOG KWIKPEN  Inject 64 Units into the skin 3 (three) times daily. Inject 22 units at breakfast and lunch and 20 units before dinner     Insulin NPH (Human) (Isophane) 100 UNIT/ML Kiwkpen  Commonly known as:  HUMULIN N  Inject 42 Units into the skin at bedtime.     Insulin Pen Needle 32G X 4 MM Misc  Commonly known as:  BD PEN NEEDLE NANO U/F  USE 5 PER DAY TO INJECT INSULIN AND VICTOZA     INSULIN SYRINGE .5CC/31GX5/16" 31G X 5/16" 0.5 ML Misc  Inject 1 each as directed daily.     INVOKANA 100 MG Tabs tablet  Generic drug:  canagliflozin  TAKE 1 TABLET BY MOUTH EVERY DAY BEFORE BREAKFAST     Liraglutide 18 MG/3ML Sopn  Inject 0.3 mLs (1.8 mg total) into the skin daily before breakfast.     metFORMIN 1000 MG tablet  Commonly known as:  GLUCOPHAGE  TAKE 1 TABLET BY MOUTH TWICE DAILY WITH A MEAL     multivitamin with minerals Tabs tablet  Take 1 tablet by mouth daily.     omeprazole 40 MG capsule  Commonly known as:  PRILOSEC  Take 1 capsule (40 mg total) by mouth daily.     [place] LANCETS FINE  Misc  Use to check blood sugar 3 times per day dx code 250.00      tadalafil 20 MG tablet  Commonly known as:  CIALIS  Take 1 tablet (20 mg total) by mouth daily as needed for erectile dysfunction.        Allergies:  Allergies  Allergen Reactions  . Cephalexin Swelling    In throat    Past Medical History  Diagnosis Date  . ATTENTION DEFICIT HYPERACTIVITY DISORDER 07/19/2006  . BENIGN PROSTATIC HYPERTROPHY 07/19/2006  . DIABETES MELLITUS, TYPE II 07/19/2006  . ERECTILE DYSFUNCTION 07/19/2006  . GERD 07/19/2006  . HEPATITIS C, CHRONIC 04/03/2007  . NEOPLASM, MALIGNANT, CARCINOMA, BASAL CELL, NOSE 10/20/2009  . Anemia     rec'd 32 units of blood, post T&A, 1981    Past Surgical History  Procedure Laterality Date  . Appendectomy    . Tonsillectomy    . Carotid injury        post tonsillectomy, R carotid injury with trach  . Tracheostomy closure  1981  . Hernia repair  1973    inguinal- R side  . Lumbar laminectomy/decompression microdiscectomy Right 09/12/2012    Procedure: LUMBAR LAMINECTOMY/DECOMPRESSION MICRODISCECTOMY 1 LEVEL;  Surgeon: Charlie Pitter, MD;  Location: [facility];  Service: Neurosurgery;  Laterality: Right;  Right lumbar three-four microdiscectomy    Family History  Problem Relation Age of Onset  . Diabetes Maternal Aunt   . Cancer Paternal Grandmother     colon cancer  . Diabetes Maternal Aunt     Social History:  reports that he has quit smoking. He quit smokeless tobacco use about 7 years ago. His alcohol and drug histories are not on file. No alcohol   Review of Systems       Lipids: Not taking any statin drugs currently , has low HDL       Lab Results  Component Value Date   CHOL 152 08/08/2013   HDL 37.10* 08/08/2013   LDLCALC 99 08/08/2013   TRIG 78.0 08/08/2013   CHOLHDL 4 08/08/2013          No history of Numbness, tingling or burning in feet     Has history of erectile dysfunction  Probably from diabetes and has been prescribed Cialis     Taking Adderall for ADHD    LABS:  No visits with results  within 1 Week(s) from this visit. Latest known visit with results is:  Lab on 04/28/2014  Component Date Value Ref Range Status  . Hgb A1c MFr Bld 04/28/2014 5.9  4.6 - 6.5 % Final   Glycemic Control Guidelines for People with Diabetes:Non Diabetic:  <6%Goal of Therapy: <7%Additional Action Suggested:  >8%   . Sodium 04/28/2014 135  135 - 145 mEq/L Final  . Potassium 04/28/2014 4.2  3.5 - 5.1 mEq/L Final  . Chloride 04/28/2014 102  96 - 112 mEq/L Final  . CO2 04/28/2014 27  19 - 32 mEq/L Final  . Glucose, Bld 04/28/2014 127* 70 - 99 mg/dL Final  . BUN 04/28/2014 23  6 - 23 mg/dL Final  . Creatinine, Ser 04/28/2014 0.90  0.40 - 1.50 mg/dL Final  . Total Bilirubin 04/28/2014 0.4  0.2 - 1.2 mg/dL Final  . Alkaline Phosphatase 04/28/2014 69  39 - 117 U/L Final  . AST 04/28/2014 26  0 - 37 U/L Final  . ALT 04/28/2014 26  0 - 53 U/L Final  . Total Protein 04/28/2014 7.2  6.0 - 8.3 g/dL Final  .  Albumin 04/28/2014 4.3  3.5 - 5.2 g/dL Final  . Calcium 04/28/2014 9.6  8.4 - 10.5 mg/dL Final  . GFR 04/28/2014 91.78  >60.00 mL/min Final    Physical Examination:  BP 170/82 mmHg  Pulse 89  Temp(Src) 98 F (36.7 C)  Resp 14  Ht 5' 7.5" (1.715 m)  Wt 198 lb 6.4 oz (89.994 kg)  BMI 30.60 kg/m2  SpO2 96%        Repeat Blood Pressure 135/80  ASSESSMENT/PLAN  Diabetes type 2, uncontrolled     See history of present illness for details of current management, blood sugar patterns and  Problems identified  His blood sugars are excellent with consistently normal A1c   Although he has fairly stable blood sugars throughout the day he has not checked check blood sugars after supper as directed as he forgets He has been on a regimen of Invokana and Victoza along with bedtime NPH insulin. He seems to be getting some tendency to hypoglycemia overnight and after lunch and discussed reducing the appropriate insulin doses Otherwise he can continue his management unchanged  HYPERTENSION: Well  controlled, did have increased blood pressure on first coming into the office   Patient Instructions  Reduce N to 38 at night  Reduce lunch dose by 2 units daily     Royer Cristobal 05/05/2014, 4:43 PM   Note: This office note was prepared with Dragon voice recognition system technology. Any transcriptional errors that result from this process are unintentional.

## 2014-05-05 NOTE — Patient Instructions (Addendum)
Reduce N to 38 at night  Reduce lunch dose by 2 units daily

## 2014-07-01 ENCOUNTER — Other Ambulatory Visit: Payer: Self-pay | Admitting: Endocrinology

## 2014-07-02 ENCOUNTER — Other Ambulatory Visit: Payer: Self-pay | Admitting: *Deleted

## 2014-07-02 MED ORDER — INSULIN ISOPHANE HUMAN 100 UNIT/ML KWIKPEN: PEN_INJECTOR | SUBCUTANEOUS | Status: DC

## 2014-07-04 ENCOUNTER — Other Ambulatory Visit: Payer: Self-pay

## 2014-07-07 ENCOUNTER — Telehealth: Payer: Self-pay | Admitting: Internal Medicine

## 2014-07-07 ENCOUNTER — Other Ambulatory Visit: Payer: Self-pay

## 2014-07-07 MED ORDER — INSULIN ISOPHANE HUMAN 100 UNIT/ML KWIKPEN: 42.0000 [IU] | PEN_INJECTOR | Freq: Every day | SUBCUTANEOUS | Status: DC

## 2014-07-07 MED ORDER — AMPHETAMINE-DEXTROAMPHETAMINE 20 MG PO TABS: 20.0000 mg | ORAL_TABLET | Freq: Three times a day (TID) | ORAL | Status: DC

## 2014-07-07 MED ORDER — AMPHETAMINE-DEXTROAMPHET ER 30 MG PO CP24: 30.0000 mg | ORAL_CAPSULE | ORAL | Status: DC

## 2014-07-07 NOTE — Telephone Encounter (Signed)
Left detailed message Rx's ready for pickup, will be at the front desk. Rx's printed and signed. 

## 2014-07-07 NOTE — Telephone Encounter (Signed)
Pt needs new rxs generic adderall xr 30 mg and generic adderall 20 mg

## 2014-07-10 ENCOUNTER — Telehealth: Payer: Self-pay | Admitting: Endocrinology

## 2014-07-10 NOTE — Telephone Encounter (Signed)
Can you call and check on the status please.

## 2014-07-10 NOTE — Telephone Encounter (Signed)
PA was sent off on 5/18

## 2014-07-10 NOTE — Telephone Encounter (Signed)
Pt calling regarding call to [facility] for tier med reductions for victoza. It will need PA.

## 2014-08-26 ENCOUNTER — Encounter: Payer: Self-pay | Admitting: Endocrinology

## 2014-09-01 ENCOUNTER — Other Ambulatory Visit: Payer: 59

## 2014-09-04 ENCOUNTER — Ambulatory Visit: Payer: 59 | Admitting: Endocrinology

## 2014-09-09 ENCOUNTER — Other Ambulatory Visit: Payer: Self-pay | Admitting: Endocrinology

## 2014-09-16 ENCOUNTER — Encounter: Payer: Self-pay | Admitting: Gastroenterology

## 2014-09-16 ENCOUNTER — Other Ambulatory Visit: Payer: Self-pay | Admitting: Nurse Practitioner

## 2014-09-16 DIAGNOSIS — C220 Liver cell carcinoma: Secondary | ICD-10-CM

## 2014-10-13 ENCOUNTER — Telehealth: Payer: Self-pay | Admitting: Internal Medicine

## 2014-10-13 MED ORDER — AMPHETAMINE-DEXTROAMPHET ER 30 MG PO CP24: 30.0000 mg | ORAL_CAPSULE | ORAL | Status: DC

## 2014-10-13 MED ORDER — AMPHETAMINE-DEXTROAMPHETAMINE 20 MG PO TABS: 20.0000 mg | ORAL_TABLET | Freq: Three times a day (TID) | ORAL | Status: DC

## 2014-10-13 NOTE — Telephone Encounter (Signed)
Spoke to pt, told him he is overdue for physical have not seen since 07/2013. Told him can only give one month supply and needs to schedule physical. PT verbalized understanding and stated he will call to schedule. Rx printed and signed.

## 2014-10-13 NOTE — Telephone Encounter (Signed)
Pt needs new rx generic adderall xr 30 mg and 20 mg

## 2014-10-14 ENCOUNTER — Telehealth: Payer: Self-pay

## 2014-10-14 NOTE — Telephone Encounter (Signed)
The pt called and stated [facility] informed him his Insulin rx expired. He is going out of town for two weeks, and will not be home to receive the refill if it is sent through [facility].  He is hoping we can give him a couple sample pens to hold him over until he returns home.   Pt's Callback - [phone]

## 2014-10-14 NOTE — Telephone Encounter (Signed)
Spoke to pt, asked him what insulin do you need? Pt stated Victoza. Told pt okay I have 2 pens I can give you, just come in and tell them you are here to pickup insulin.  Pt verbalized understanding.

## 2014-10-15 ENCOUNTER — Other Ambulatory Visit: Payer: Self-pay | Admitting: Internal Medicine

## 2014-10-16 ENCOUNTER — Telehealth: Payer: Self-pay | Admitting: Endocrinology

## 2014-10-16 ENCOUNTER — Other Ambulatory Visit: Payer: Self-pay | Admitting: *Deleted

## 2014-10-16 MED ORDER — GLUCOSE BLOOD VI STRP: ORAL_STRIP | Status: DC

## 2014-10-16 NOTE — Telephone Encounter (Signed)
Patient called stating that he would like a refill on his Rx   Rx: Test strips   Pharmacy: [facility]  Thank you

## 2014-10-16 NOTE — Telephone Encounter (Signed)
rx sent

## 2014-10-31 ENCOUNTER — Telehealth: Payer: Self-pay | Admitting: Internal Medicine

## 2014-10-31 MED ORDER — LIRAGLUTIDE 18 MG/3ML ~~LOC~~ SOPN: 1.8000 mg | PEN_INJECTOR | Freq: Every day | SUBCUTANEOUS | Status: DC

## 2014-10-31 NOTE — Telephone Encounter (Signed)
Needs Victoza RX sent to [facility] please.

## 2014-10-31 NOTE — Telephone Encounter (Signed)
Rx sent to [facility]

## 2014-11-12 ENCOUNTER — Ambulatory Visit (INDEPENDENT_AMBULATORY_CARE_PROVIDER_SITE_OTHER): Payer: Commercial Managed Care - HMO | Admitting: Endocrinology

## 2014-11-12 ENCOUNTER — Telehealth: Payer: Self-pay | Admitting: Internal Medicine

## 2014-11-12 ENCOUNTER — Encounter: Payer: Self-pay | Admitting: Endocrinology

## 2014-11-12 VITALS — BP 134/72 | HR 80 | Temp 98.4°F | Resp 16 | Ht 67.5 in | Wt 197.8 lb

## 2014-11-12 DIAGNOSIS — I10 Essential (primary) hypertension: Secondary | ICD-10-CM

## 2014-11-12 DIAGNOSIS — E119 Type 2 diabetes mellitus without complications: Secondary | ICD-10-CM

## 2014-11-12 LAB — POCT GLYCOSYLATED HEMOGLOBIN (HGB A1C): Hemoglobin A1C: 5.5

## 2014-11-12 NOTE — Telephone Encounter (Signed)
Pt need new rxs generic adderall xr 30 mg and 20 mg. Pt has physical sch in oct 2016.

## 2014-11-12 NOTE — Progress Notes (Signed)
Patient ID: Michael Li, male   DOB: 07-26-1955, 59 y.o.   MRN: [id number]               Reason for Appointment: Followup for Type 2 Diabetes  Referring physician: Burnice Logan  History of Present Illness:          Diagnosis: Type 2 diabetes mellitus, date of diagnosis: 2003        Past history:  Initial glucose was about 485 and he had been symptomatic with weakness and fatigue He was initially treated with metformin and glimepiride with initially reasonably good control At one point his A1c was down to 7.3% However about 2 years later his blood sugars were getting out of control and he was started on insulin He thinks he was given mealtime insulin first and then added NPH at bedtime which he has continued Victoza was added about 3 years later and he thinks glucose readings were some better with this  He was started on Invokana in addition to metformin and Victoza in 7/15.    Recent history:  He has not been seen in follow-up since 3/16   He is  taking bedtime NPH insulin along with mealtime NovoLog He now says that he is taking about 10 units less of his NPH and also somewhat less mealtime coverage but does not know when this changed  A1c again is quite normal    Current blood sugar patterns and problems identified:   He lost his meter a couple of weeks ago and not clear what his blood sugars are; previously had been checking readings mostly fasting  His average blood sugars are looking better and by history appears that he has less fluctuation  He has been able to reduce his insulin dose significantly since his last visit and he thinks this is because of his going off his hepatitis C drug.  He does not think he has any hypoglycemia except about once a week in the afternoon but none overnight  He thinks he is adjusting his mealtime insulin a little based on the type of meal but also thinks that if he is eating out to me like a [place] meal he will tend to get low at about  3 PM  He believes his blood sugars after supper are fairly good  Fasting readings are reportedly excellent except higher once when he forgot his NPH     Oral hypoglycemic drugs the patient is taking are: Metformin 1 g twice a day, Invokana 100 mg daily      Side effects from medications have been: None INSULIN regimen is described as: NPH 30-32 hs, NovoLog 15 breakfast; 15-16  lunch- 20-22  supper   Glucose monitoring:  ?  2-3 times a day        Glucometer:  Verio     Blood Glucose readings from recall   PRE-MEAL Fasting Lunch Dinner Bedtime Overall  Glucose range: 110-125  115-130 120-140   Mean/median:         Glycemic control:     Lab Results  Component Value Date   HGBA1C 5.5 11/12/2014   HGBA1C 5.9 04/28/2014   HGBA1C 6.0 12/04/2013   Lab Results  Component Value Date   MICROALBUR 2.6* 08/08/2013   [place] 99 08/08/2013   CREATININE 0.90 04/28/2014    Self-care: The diet that the patient has been following is: None     Meals: 3 meals per day.  Egg or a toast at breakfast, at lunch he  will have a sandwich or salad or burger; dinner will have meat and starch. Eating out at least once a day with restaurants like [place], [place] or [place]  Exercise:  he is walking at least twice a day up to 3 miles         Dietician visit: Most recent: Never.               Retinal exam: Most recent: 2015, normal exam.    Weight history: upto 210 in last few years  Wt Readings from Last 3 Encounters:  11/12/14 197 lb 12.8 oz (89.721 kg)  05/05/14 198 lb 6.4 oz (89.994 kg)  12/09/13 194 lb (87.998 kg)      Medication List       This list is accurate as of: 11/12/14  9:14 PM.  Always use your most recent med list.               amphetamine-dextroamphetamine 30 MG 24 hr capsule  Commonly known as:  ADDERALL XR  Take 1 capsule (30 mg total) by mouth every morning.     amphetamine-dextroamphetamine 20 MG tablet  Commonly known as:  ADDERALL  Take 1 tablet (20 mg total) by  mouth 3 (three) times daily.     aspirin 81 MG tablet  Take 81 mg by mouth daily.     glucose blood test strip  Commonly known as:  ONETOUCH VERIO  Use as instructed to check blood sugar 3 times per day dx code E11.65     ibuprofen 800 MG tablet  Commonly known as:  ADVIL,MOTRIN     Insulin Lispro (Human) 200 UNIT/ML Sopn  Commonly known as:  HUMALOG KWIKPEN  Inject 64 Units into the skin 3 (three) times daily. Inject 22 units at breakfast and lunch and 20 units before dinner     Insulin NPH (Human) (Isophane) 100 UNIT/ML Kiwkpen  Commonly known as:  HUMULIN N  Inject 42 Units into the skin at bedtime.     Insulin Pen Needle 32G X 4 MM Misc  Commonly known as:  BD PEN NEEDLE NANO U/F  USE 5 PER DAY TO INJECT INSULIN AND VICTOZA     INSULIN SYRINGE .5CC/31GX5/16" 31G X 5/16" 0.5 ML Misc  Inject 1 each as directed daily.     INVOKANA 100 MG Tabs tablet  Generic drug:  canagliflozin  TAKE 1 TABLET BY MOUTH EVERY DAY BEFORE BREAKFAST     Liraglutide 18 MG/3ML Sopn  Inject 0.3 mLs (1.8 mg total) into the skin daily before breakfast.     metFORMIN 1000 MG tablet  Commonly known as:  GLUCOPHAGE  TAKE 1 TABLET BY MOUTH TWICE DAILY WITH A MEAL     metFORMIN 1000 MG tablet  Commonly known as:  GLUCOPHAGE  TAKE 1 TABLET BY MOUTH TWICE DAILY WITH A MEAL     multivitamin with minerals Tabs tablet  Take 1 tablet by mouth daily.     omeprazole 40 MG capsule  Commonly known as:  PRILOSEC  Take 1 capsule (40 mg total) by mouth daily.     ONETOUCH DELICA LANCETS FINE Misc  Use to check blood sugar 3 times per day dx code 250.00     tadalafil 20 MG tablet  Commonly known as:  CIALIS  Take 1 tablet (20 mg total) by mouth daily as needed for erectile dysfunction.        Allergies:  Allergies  Allergen Reactions  . Cephalexin Swelling    In throat  Past Medical History  Diagnosis Date  . ATTENTION DEFICIT HYPERACTIVITY DISORDER 07/19/2006  . BENIGN PROSTATIC HYPERTROPHY  07/19/2006  . DIABETES MELLITUS, TYPE II 07/19/2006  . ERECTILE DYSFUNCTION 07/19/2006  . GERD 07/19/2006  . HEPATITIS C, CHRONIC 04/03/2007  . NEOPLASM, MALIGNANT, CARCINOMA, BASAL CELL, NOSE 10/20/2009  . Anemia     rec'd 32 units of blood, post T&A, 1981    Past Surgical History  Procedure Laterality Date  . Appendectomy    . Tonsillectomy    . Carotid injury        post tonsillectomy, R carotid injury with trach  . Tracheostomy closure  1981  . Hernia repair  1973    inguinal- R side  . Lumbar laminectomy/decompression microdiscectomy Right 09/12/2012    Procedure: LUMBAR LAMINECTOMY/DECOMPRESSION MICRODISCECTOMY 1 LEVEL;  Surgeon: Charlie Pitter, MD;  Location: [facility];  Service: Neurosurgery;  Laterality: Right;  Right lumbar three-four microdiscectomy    Family History  Problem Relation Age of Onset  . Diabetes Maternal Aunt   . Cancer Paternal Grandmother     colon cancer  . Diabetes Maternal Aunt     Social History:  reports that he has quit smoking. He quit smokeless tobacco use about 7 years ago. His alcohol and drug histories are not on file. No alcohol   Review of Systems       Lipids: Not taking any statin drugs.  Last LDL below 100 , has low HDL Needs follow-up labs       Lab Results  Component Value Date   CHOL 152 08/08/2013   HDL 37.10* 08/08/2013   LDLCALC 99 08/08/2013   TRIG 78.0 08/08/2013   CHOLHDL 4 08/08/2013         Has history of erectile dysfunction and has been prescribed Cialis     Taking Adderall for ADHD    LABS:  Office Visit on 11/12/2014  Component Date Value Ref Range Status  . Hemoglobin A1C 11/12/2014 5.5   Final    Physical Examination:  BP 134/72 mmHg  Pulse 80  Temp(Src) 98.4 F (36.9 C)  Resp 16  Ht 5' 7.5" (1.715 m)  Wt 197 lb 12.8 oz (89.721 kg)  BMI 30.50 kg/m2  SpO2 95%       no ankle edema present  ASSESSMENT/PLAN  Diabetes type 2, uncontrolled     See history of present illness for details of  current management, blood sugar patterns  Although he has not been seen for several months he appears to have somewhat better control and A1c is fairly normal without excessive hypoglycemia He has had reduced insulin requirement with getting off his hepatitis C drug especially overnight basal insulin  Since he did not bring his monitor for download not clear what his blood sugar patterns are and when he is monitoring his blood sugar Discussed adjustment of mealtime insulin based on mildly carbohydrate intake but also high fat or high protein content; he appears to needing less mealtime coverage if he has a higher protein or high fat meal especially at lunchtime Again discussed timing and targets of blood sugar readings  Since he thinks his fasting readings are fairly good will continue same NPH dose  He does need to have his renal function rechecked and he will do this next month when he has his physical  Otherwise he can continue his management unchanged including metformin, Invokana and Victoza Consider using the combination of metformin and extended release Invokana when available  HYPERTENSION: Well  controlled  Counseling time on subjects discussed above is over 50% of today's 25 minute visit  Aarini Slee 11/12/2014, 9:14 PM   Note: This office note was prepared with Estate agent. Any transcriptional errors that result from this process are unintentional.

## 2014-11-13 MED ORDER — AMPHETAMINE-DEXTROAMPHET ER 30 MG PO CP24: 30.0000 mg | ORAL_CAPSULE | ORAL | Status: DC

## 2014-11-13 MED ORDER — AMPHETAMINE-DEXTROAMPHETAMINE 20 MG PO TABS: 20.0000 mg | ORAL_TABLET | Freq: Three times a day (TID) | ORAL | Status: DC

## 2014-11-13 NOTE — Telephone Encounter (Signed)
Left detailed message on voicemail Rx's ready for pickup will be at the front desk. Rx's printed and signed.

## 2014-11-24 ENCOUNTER — Ambulatory Visit: Payer: 59 | Admitting: Gastroenterology

## 2014-11-25 ENCOUNTER — Other Ambulatory Visit: Payer: 59

## 2014-11-27 ENCOUNTER — Other Ambulatory Visit (INDEPENDENT_AMBULATORY_CARE_PROVIDER_SITE_OTHER): Payer: Commercial Managed Care - HMO

## 2014-11-27 DIAGNOSIS — Z0000 Encounter for general adult medical examination without abnormal findings: Secondary | ICD-10-CM | POA: Diagnosis not present

## 2014-11-27 LAB — CBC WITH DIFFERENTIAL/PLATELET
Basophils Absolute: 0 10*3/uL (ref 0.0–0.1)
Basophils Relative: 0.2 % (ref 0.0–3.0)
Eosinophils Absolute: 0.1 10*3/uL (ref 0.0–0.7)
Eosinophils Relative: 1.1 % (ref 0.0–5.0)
HCT: 49.7 % (ref 39.0–52.0)
Hemoglobin: 17 g/dL (ref 13.0–17.0)
Lymphocytes Relative: 21.8 % (ref 12.0–46.0)
Lymphs Abs: 1.8 10*3/uL (ref 0.7–4.0)
MCHC: 34.3 g/dL (ref 30.0–36.0)
MCV: 88.8 fl (ref 78.0–100.0)
Monocytes Absolute: 0.8 10*3/uL (ref 0.1–1.0)
Monocytes Relative: 9.6 % (ref 3.0–12.0)
Neutro Abs: 5.6 10*3/uL (ref 1.4–7.7)
Neutrophils Relative %: 67.3 % (ref 43.0–77.0)
Platelets: 177 10*3/uL (ref 150.0–400.0)
RBC: 5.6 Mil/uL (ref 4.22–5.81)
RDW: 12.7 % (ref 11.5–15.5)
WBC: 8.3 10*3/uL (ref 4.0–10.5)

## 2014-11-27 LAB — POCT URINALYSIS DIPSTICK
Bilirubin, UA: NEGATIVE
Blood, UA: NEGATIVE
Leukocytes, UA: NEGATIVE
Nitrite, UA: NEGATIVE
Spec Grav, UA: >=1.03
Urobilinogen, UA: 0.2
pH, UA: 5

## 2014-11-27 LAB — BASIC METABOLIC PANEL
BUN: 22 mg/dL (ref 6–23)
CO2: 27 mEq/L (ref 19–32)
Calcium: 9.5 mg/dL (ref 8.4–10.5)
Chloride: 103 mEq/L (ref 96–112)
Creatinine, Ser: 1.02 mg/dL (ref 0.40–1.50)
GFR: 79.28 mL/min (ref >60.00)
Glucose, Bld: 108 mg/dL — ABNORMAL HIGH (ref 70–99)
Potassium: 4.2 mEq/L (ref 3.5–5.1)
Sodium: 139 mEq/L (ref 135–145)

## 2014-11-27 LAB — MICROALBUMIN / CREATININE URINE RATIO
Creatinine,U: 272.5 mg/dL
Microalb Creat Ratio: 2.3 mg/g (ref 0.0–30.0)
Microalb, Ur: 6.3 mg/dL — ABNORMAL HIGH (ref 0.0–1.9)

## 2014-11-27 LAB — PSA: PSA: 0.95 ng/mL (ref 0.10–4.00)

## 2014-11-27 LAB — LIPID PANEL
Cholesterol: 152 mg/dL (ref 0–200)
HDL: 34 mg/dL — ABNORMAL LOW (ref >39.00)
LDL Cholesterol: 91 mg/dL (ref 0–99)
NonHDL: 118.02
Total CHOL/HDL Ratio: 4
Triglycerides: 134 mg/dL (ref 0.0–149.0)
VLDL: 26.8 mg/dL (ref 0.0–40.0)

## 2014-11-27 LAB — HEPATIC FUNCTION PANEL
ALT: 27 U/L (ref 0–53)
AST: 23 U/L (ref 0–37)
Albumin: 4.3 g/dL (ref 3.5–5.2)
Alkaline Phosphatase: 60 U/L (ref 39–117)
Bilirubin, Direct: 0.2 mg/dL (ref 0.0–0.3)
Total Bilirubin: 0.9 mg/dL (ref 0.2–1.2)
Total Protein: 6.8 g/dL (ref 6.0–8.3)

## 2014-11-27 LAB — TSH: TSH: 2.06 u[IU]/mL (ref 0.35–4.50)

## 2014-11-27 LAB — HEMOGLOBIN A1C: Hgb A1c MFr Bld: 5.7 % (ref 4.6–6.5)

## 2014-12-02 ENCOUNTER — Encounter: Payer: 59 | Admitting: Internal Medicine

## 2014-12-10 ENCOUNTER — Ambulatory Visit (INDEPENDENT_AMBULATORY_CARE_PROVIDER_SITE_OTHER): Payer: Commercial Managed Care - HMO | Admitting: Internal Medicine

## 2014-12-10 ENCOUNTER — Encounter: Payer: Self-pay | Admitting: Internal Medicine

## 2014-12-10 VITALS — BP 140/80 | HR 78 | Temp 97.7°F | Resp 20 | Ht 67.0 in | Wt 194.0 lb

## 2014-12-10 DIAGNOSIS — Z0000 Encounter for general adult medical examination without abnormal findings: Secondary | ICD-10-CM

## 2014-12-10 DIAGNOSIS — Z23 Encounter for immunization: Secondary | ICD-10-CM

## 2014-12-10 NOTE — Progress Notes (Signed)
Pre visit review using our clinic review tool, if applicable. No additional management support is needed unless otherwise documented below in the visit note. 

## 2014-12-10 NOTE — Progress Notes (Signed)
Subjective:    Patient ID: Michael Li, male    DOB: 01/12/1956, 59 y.o.   MRN: [id number]  HPI   59 year-old patient who is in today for a preventive health examination.  He has not been seen in about one year.  He has been followed by endocrinology and has maintained very nice glycemic control.  He has a history of chronic hepatitis C and has been treated.  His hepatitis C viral load has been undetectable.  LFTs reviewed and are normal.  No concerns or complaints.  Is scheduled for a I exam later today.  Has received a recall letter from GI for follow-up colonoscopy after the first of the year    Lab Results  Component Value Date   HGBA1C 5.7 11/27/2014    Past Medical History  Diagnosis Date  . ATTENTION DEFICIT HYPERACTIVITY DISORDER 07/19/2006  . BENIGN PROSTATIC HYPERTROPHY 07/19/2006  . DIABETES MELLITUS, TYPE II 07/19/2006  . ERECTILE DYSFUNCTION 07/19/2006  . GERD 07/19/2006  . HEPATITIS C, CHRONIC 04/03/2007  . NEOPLASM, MALIGNANT, CARCINOMA, BASAL CELL, NOSE 10/20/2009  . Anemia     rec'd 32 units of blood, post T&A, 1981    Social History   Social History  . Marital Status: Single    Spouse Name: N/A  . Number of Children: 2  . Years of Education: N/A   Occupational History  . Designer, television/film set    Social History Main Topics  . Smoking status: Former Research scientist (life sciences)  . Smokeless tobacco: Former Systems developer    Quit date: 02/15/2007  . Alcohol Use: Not on file  . Drug Use: Not on file  . Sexual Activity: Not on file   Other Topics Concern  . Not on file   Social History Narrative   Regular exercise-yes    Past Surgical History  Procedure Laterality Date  . Appendectomy    . Tonsillectomy    . Carotid injury        post tonsillectomy, R carotid injury with trach  . Tracheostomy closure  1981  . Hernia repair  1973    inguinal- R side  . Lumbar laminectomy/decompression microdiscectomy Right 09/12/2012    Procedure: LUMBAR LAMINECTOMY/DECOMPRESSION  MICRODISCECTOMY 1 LEVEL;  Surgeon: Charlie Pitter, MD;  Location: [facility];  Service: Neurosurgery;  Laterality: Right;  Right lumbar three-four microdiscectomy    Family History  Problem Relation Age of Onset  . Diabetes Maternal Aunt   . Cancer Paternal Grandmother     colon cancer  . Diabetes Maternal Aunt     Allergies  Allergen Reactions  . Cephalexin Swelling    In throat    Current Outpatient Prescriptions on File Prior to Visit  Medication Sig Dispense Refill  . amphetamine-dextroamphetamine (ADDERALL XR) 30 MG 24 hr capsule Take 1 capsule (30 mg total) by mouth every morning. 30 capsule 0  . amphetamine-dextroamphetamine (ADDERALL XR) 30 MG 24 hr capsule Take 1 capsule (30 mg total) by mouth every morning. 30 capsule 0  . amphetamine-dextroamphetamine (ADDERALL) 20 MG tablet Take 1 tablet (20 mg total) by mouth 3 (three) times daily. 90 tablet 0  . amphetamine-dextroamphetamine (ADDERALL) 20 MG tablet Take 1 tablet (20 mg total) by mouth 3 (three) times daily. 90 tablet 0  . aspirin 81 MG tablet Take 81 mg by mouth daily.      Marland Kitchen glucose blood (ONETOUCH VERIO) test strip Use as instructed to check blood sugar 3 times per day dx code E11.65 300 each 1  .  ibuprofen (ADVIL,MOTRIN) 800 MG tablet   1  . Insulin Lispro, Human, (HUMALOG KWIKPEN) 200 UNIT/ML SOPN Inject 64 Units into the skin 3 (three) times daily. Inject 22 units at breakfast and lunch and 20 units before dinner (Patient taking differently: Inject into the skin 3 (three) times daily. Inject 15 units at breakfast and lunch and 18 units before dinner) 60 mL 1  . Insulin NPH, Human,, Isophane, (HUMULIN N) 100 UNIT/ML Kiwkpen Inject 42 Units into the skin at bedtime. 45 mL 3  . Insulin Pen Needle (BD PEN NEEDLE NANO U/F) 32G X 4 MM MISC USE 5 PER DAY TO INJECT INSULIN AND VICTOZA 450 each 1  . Insulin Syringe-Needle U-100 (INSULIN SYRINGE .5CC/31GX5/16") 31G X 5/16" 0.5 ML MISC Inject 1 each as directed daily. 100 each 3    . INVOKANA 100 MG TABS tablet TAKE 1 TABLET BY MOUTH EVERY DAY BEFORE BREAKFAST 30 tablet 1  . Liraglutide 18 MG/3ML SOPN Inject 0.3 mLs (1.8 mg total) into the skin daily before breakfast. 9 pen 3  . metFORMIN (GLUCOPHAGE) 1000 MG tablet TAKE 1 TABLET BY MOUTH TWICE DAILY WITH A MEAL 180 tablet 1  . metFORMIN (GLUCOPHAGE) 1000 MG tablet TAKE 1 TABLET BY MOUTH TWICE DAILY WITH A MEAL 180 tablet 1  . Multiple Vitamin (MULTIVITAMIN WITH MINERALS) TABS Take 1 tablet by mouth daily.    Marland Kitchen omeprazole (PRILOSEC) 40 MG capsule Take 1 capsule (40 mg total) by mouth daily. 90 capsule 3  . ONETOUCH DELICA LANCETS FINE MISC Use to check blood sugar 3 times per day dx code 250.00 300 each 1  . tadalafil (CIALIS) 20 MG tablet Take 1 tablet (20 mg total) by mouth daily as needed for erectile dysfunction. 10 tablet 1  . [DISCONTINUED] insulin aspart (NOVOLOG FLEXPEN) 100 unit/mL SOLN FlexPen Inject subcutaneously (just before each meal) 22-22-20 units 25 pen 3   No current facility-administered medications on file prior to visit.    BP 140/80 mmHg  Pulse 78  Temp(Src) 97.7 F (36.5 C) (Oral)  Resp 20  Ht 5\' 7"  (1.702 m)  Wt 194 lb (87.998 kg)  BMI 30.38 kg/m2  SpO2 98%     Review of Systems  Constitutional: Negative for fever, chills, activity change, appetite change and fatigue.  HENT: Negative for congestion, dental problem, ear pain, hearing loss, mouth sores, rhinorrhea, sinus pressure, sneezing, tinnitus, trouble swallowing and voice change.   Eyes: Negative for photophobia, pain, redness and visual disturbance.  Respiratory: Negative for apnea, cough, choking, chest tightness, shortness of breath and wheezing.   Cardiovascular: Negative for chest pain, palpitations and leg swelling.  Gastrointestinal: Negative for nausea, vomiting, abdominal pain, diarrhea, constipation, blood in stool, abdominal distention, anal bleeding and rectal pain.  Genitourinary: Negative for dysuria, urgency,  frequency, hematuria, flank pain, decreased urine volume, discharge, penile swelling, scrotal swelling, difficulty urinating, genital sores and testicular pain.  Musculoskeletal: Negative for myalgias, back pain, joint swelling, arthralgias, gait problem, neck pain and neck stiffness.  Skin: Negative for color change, rash and wound.  Neurological: Negative for dizziness, tremors, seizures, syncope, facial asymmetry, speech difficulty, weakness, light-headedness, numbness and headaches.  Hematological: Negative for adenopathy. Does not bruise/bleed easily.  Psychiatric/Behavioral: Positive for sleep disturbance. Negative for suicidal ideas, hallucinations, behavioral problems, confusion, self-injury, dysphoric mood, decreased concentration and agitation. The patient is not nervous/anxious.        Objective:   Physical Exam  Constitutional: He appears well-developed and well-nourished.  HENT:  Head: Normocephalic and atraumatic.  Right Ear: External ear normal.  Left Ear: External ear normal.  Nose: Nose normal.  Mouth/Throat: Oropharynx is clear and moist.  Eyes: Conjunctivae and EOM are normal. Pupils are equal, round, and reactive to light. No scleral icterus.  Neck: Normal range of motion. Neck supple. No JVD present. No thyromegaly present.  Cardiovascular: Regular rhythm, normal heart sounds and intact distal pulses.  Exam reveals no gallop and no friction rub.   No murmur heard. Pulmonary/Chest: Effort normal and breath sounds normal. He exhibits no tenderness.  Abdominal: Soft. Bowel sounds are normal. He exhibits no distension and no mass. There is no tenderness.  Genitourinary: Penis normal. Guaiac negative stool.  Prostate plus 2  Musculoskeletal: Normal range of motion. He exhibits no edema or tenderness.  Lymphadenopathy:    He has no cervical adenopathy.  Neurological: He is alert. He has normal reflexes. No cranial nerve deficit. Coordination normal.  Skin: Skin is warm  and dry. No rash noted.  Psychiatric: He has a normal mood and affect. His behavior is normal.          Assessment & Plan:   Preventive health examination Diabetes mellitus.  Well-controlled.  Follow-up endocrinology    Chronic hepatitis C.  status post treatment

## 2014-12-10 NOTE — Patient Instructions (Signed)
Limit your sodium (Salt) intake    It is important that you exercise regularly, at least 20 minutes 3 to 4 times per week.  If you develop chest pain or shortness of breath seek  medical attention.  Schedule your colonoscopy to help detect colon cancer.  Return in one year for follow-up  

## 2014-12-26 ENCOUNTER — Other Ambulatory Visit: Payer: Self-pay | Admitting: *Deleted

## 2014-12-26 MED ORDER — GLUCOSE BLOOD VI STRP: ORAL_STRIP | Status: DC

## 2014-12-29 ENCOUNTER — Other Ambulatory Visit: Payer: Self-pay | Admitting: *Deleted

## 2014-12-29 MED ORDER — INSULIN PEN NEEDLE 32G X 4 MM MISC: Status: DC

## 2014-12-29 MED ORDER — METFORMIN HCL 1000 MG PO TABS: ORAL_TABLET | ORAL | Status: DC

## 2015-01-26 ENCOUNTER — Telehealth: Payer: Self-pay | Admitting: Internal Medicine

## 2015-01-26 NOTE — Telephone Encounter (Signed)
Mr. Tanner needs a Rx for Adderall. Please give him a call if you have questions or concerns.  Pt's ph# [phone] Thank you.

## 2015-01-26 NOTE — Telephone Encounter (Signed)
Pt last visit 12/10/14 Pt last refill 11/13/14 with 2 refills

## 2015-01-26 NOTE — Telephone Encounter (Signed)
Ok for RF 

## 2015-01-27 MED ORDER — AMPHETAMINE-DEXTROAMPHET ER 30 MG PO CP24: 30.0000 mg | ORAL_CAPSULE | ORAL | Status: DC

## 2015-01-27 MED ORDER — AMPHETAMINE-DEXTROAMPHETAMINE 20 MG PO TABS: 20.0000 mg | ORAL_TABLET | Freq: Three times a day (TID) | ORAL | Status: DC

## 2015-01-27 NOTE — Telephone Encounter (Signed)
Rx is ready for pick up. Voicemail was left for pt

## 2015-01-28 ENCOUNTER — Other Ambulatory Visit: Payer: Self-pay | Admitting: Endocrinology

## 2015-02-10 LAB — HM COLONOSCOPY

## 2015-03-05 ENCOUNTER — Telehealth: Payer: Self-pay | Admitting: Internal Medicine

## 2015-03-05 NOTE — Telephone Encounter (Signed)
Spoke to pt, told him Rx's were given to him on Dec 13th and should have refills left. Pt looked and he found them. Told him okay call when you need refill. Pt verbalized understanding.

## 2015-03-05 NOTE — Telephone Encounter (Signed)
Pt request refill   Amphetamine-dextroamphetamine (ADDERALL XR) 30 MG 24 hr capsule   1 / day amphetamine-dextroamphetamine (ADDERALL) 20 MG tablet    3 X day ( pt states if needs)  90 day supply

## 2015-03-10 ENCOUNTER — Other Ambulatory Visit: Payer: Self-pay | Admitting: *Deleted

## 2015-03-10 ENCOUNTER — Other Ambulatory Visit (INDEPENDENT_AMBULATORY_CARE_PROVIDER_SITE_OTHER): Payer: Managed Care, Other (non HMO)

## 2015-03-10 DIAGNOSIS — E119 Type 2 diabetes mellitus without complications: Secondary | ICD-10-CM

## 2015-03-10 LAB — COMPREHENSIVE METABOLIC PANEL
ALT: 34 U/L (ref 0–53)
AST: 28 U/L (ref 0–37)
Albumin: 4.4 g/dL (ref 3.5–5.2)
Alkaline Phosphatase: 63 U/L (ref 39–117)
BUN: 18 mg/dL (ref 6–23)
CO2: 25 mEq/L (ref 19–32)
Calcium: 9.4 mg/dL (ref 8.4–10.5)
Chloride: 104 mEq/L (ref 96–112)
Creatinine, Ser: 0.98 mg/dL (ref 0.40–1.50)
GFR: 82.95 mL/min (ref >60.00)
Glucose, Bld: 122 mg/dL — ABNORMAL HIGH (ref 70–99)
Potassium: 4.1 mEq/L (ref 3.5–5.1)
Sodium: 138 mEq/L (ref 135–145)
Total Bilirubin: 0.7 mg/dL (ref 0.2–1.2)
Total Protein: 6.9 g/dL (ref 6.0–8.3)

## 2015-03-10 LAB — URINALYSIS, ROUTINE W REFLEX MICROSCOPIC
Bilirubin Urine: NEGATIVE
Hgb urine dipstick: NEGATIVE
Ketones, ur: NEGATIVE
Leukocytes, UA: NEGATIVE
Nitrite: NEGATIVE
RBC / HPF: NONE SEEN (ref 0–?)
Specific Gravity, Urine: 1.025 (ref 1.000–1.030)
Total Protein, Urine: NEGATIVE
Urine Glucose: >=1000 — AB
Urobilinogen, UA: 0.2 (ref 0.0–1.0)
WBC, UA: NONE SEEN (ref 0–?)
pH: 5.5 (ref 5.0–8.0)

## 2015-03-10 LAB — HEMOGLOBIN A1C: Hgb A1c MFr Bld: 5.8 % (ref 4.6–6.5)

## 2015-03-10 MED ORDER — CANAGLIFLOZIN 100 MG PO TABS: ORAL_TABLET | ORAL | Status: DC

## 2015-03-13 ENCOUNTER — Ambulatory Visit: Payer: 59 | Admitting: Endocrinology

## 2015-03-16 ENCOUNTER — Other Ambulatory Visit: Payer: Self-pay | Admitting: *Deleted

## 2015-03-19 ENCOUNTER — Other Ambulatory Visit: Payer: Self-pay | Admitting: *Deleted

## 2015-03-19 ENCOUNTER — Encounter: Payer: Self-pay | Admitting: Endocrinology

## 2015-03-19 ENCOUNTER — Ambulatory Visit (INDEPENDENT_AMBULATORY_CARE_PROVIDER_SITE_OTHER): Payer: Managed Care, Other (non HMO) | Admitting: Endocrinology

## 2015-03-19 VITALS — BP 128/82 | HR 93 | Temp 97.8°F | Resp 14 | Ht 67.0 in | Wt 195.2 lb

## 2015-03-19 DIAGNOSIS — E119 Type 2 diabetes mellitus without complications: Secondary | ICD-10-CM

## 2015-03-19 MED ORDER — CANAGLIFLOZIN 100 MG PO TABS: ORAL_TABLET | ORAL | Status: DC

## 2015-03-19 NOTE — Progress Notes (Signed)
Patient ID: Michael Li, male   DOB: September 27, 1955, 60 y.o.   MRN: [id number]               Reason for Appointment: Followup for Type 2 Diabetes  Referring physician: [facility]  History of Present Illness:          Diagnosis: Type 2 diabetes mellitus, date of diagnosis: 2003        Past history:  Initial glucose was about 485 and he had been symptomatic with weakness and fatigue He was initially treated with metformin and glimepiride with initially reasonably good control At one point his A1c was down to 7.3% However about 2 years later his blood sugars were getting out of control and he was started on insulin He thinks he was given mealtime insulin first and then added NPH at bedtime which he has continued Victoza was added about 3 years later and he thinks glucose readings were some better with this  He was started on Invokana in addition to metformin and Victoza in 7/15.    Recent history:   INSULIN regimen is described as: NPH 32 hs 11 pm, NovoLog 15 breakfast; 15-16  lunch- 20-22  supper   Non-insulin hypoglycemic drugs the patient is taking are: Metformin 1 g twice a day, Invokana 100 mg daily, Victoza 1.8 mg daily      He is  taking bedtime NPH insulin along with mealtime NovoLog  A1c again is quite normal  Again did not bring his glucose monitor for download   Current blood sugar patterns and problems identified:  His fasting blood sugars are reportedly relatively lower and occasionally low normal.  He has had a couple of episodes of low blood sugars at 2 AM in the last month  He does not adjust his  insulin doses based on blood sugar patterns  Also was somewhat unclear about his NPH dosage when first asked   Lab glucose was 122 fasting  He does not check blood sugars much later in the day and probably some readings only at suppertime  He believes his blood sugars after supper are fairly good, highest only about 150 when he is not consistent with diet        Side effects from medications have been: None   Glucose monitoring:  ?  2 times a day        Glucometer:  Verio     Blood Glucose readings from recall  Mean values apply above for all meters except median for One Touch  PRE-MEAL Fasting Lunch Dinner Bedtime Overall  Glucose range: 88-110 ? 100-115 <150   Mean/median:          Glycemic control:     Lab Results  Component Value Date   HGBA1C 5.8 03/10/2015   HGBA1C 5.7 11/27/2014   HGBA1C 5.5 11/12/2014   Lab Results  Component Value Date   MICROALBUR 6.3* 11/27/2014   LDLCALC 91 11/27/2014   CREATININE 0.98 03/10/2015    Self-care: The diet that the patient has been following is: None     Meals: 3 meals per day.  Egg or a toast at breakfast, at lunch he will have a sandwich or salad or burger; dinner will have meat and starch. Eating out at least once a day with restaurants like [place], [place] or [place]  Exercise:  he is walking twice a day but not consistently over winter months      Dietician visit: Most recent: Never.  Retinal exam: Most recent: 2015, normal exam.    Weight history: upto 210 in last few years  Wt Readings from Last 3 Encounters:  03/19/15 195 lb 3.2 oz (88.542 kg)  12/10/14 194 lb (87.998 kg)  11/12/14 197 lb 12.8 oz (89.721 kg)      Medication List       This list is accurate as of: 03/19/15 11:59 PM.  Always use your most recent med list.               amphetamine-dextroamphetamine 30 MG 24 hr capsule  Commonly known as:  ADDERALL XR  Take 1 capsule (30 mg total) by mouth every morning.     amphetamine-dextroamphetamine 20 MG tablet  Commonly known as:  ADDERALL  Take 1 tablet (20 mg total) by mouth 3 (three) times daily.     aspirin 81 MG tablet  Take 81 mg by mouth daily.     canagliflozin 100 MG Tabs tablet  Commonly known as:  INVOKANA  TAKE 1 TABLET BY MOUTH EVERY DAY BEFORE BREAKFAST     glucose blood test strip  Commonly known as:  ONETOUCH VERIO  Use  as instructed to check blood sugar 3 times per day dx code E11.65     ibuprofen 800 MG tablet  Commonly known as:  ADVIL,MOTRIN     Insulin Lispro 200 UNIT/ML Sopn  Commonly known as:  HUMALOG KWIKPEN  Inject 64 Units into the skin 3 (three) times daily. Inject 22 units at breakfast and lunch and 20 units before dinner     Insulin NPH (Human) (Isophane) 100 UNIT/ML Kiwkpen  Commonly known as:  HUMULIN N  Inject 42 Units into the skin at bedtime.     Insulin Pen Needle 32G X 4 MM Misc  Commonly known as:  BD PEN NEEDLE NANO U/F  USE 5 PER DAY TO INJECT INSULIN AND VICTOZA     INSULIN SYRINGE .5CC/31GX5/16" 31G X 5/16" 0.5 ML Misc  Inject 1 each as directed daily.     Liraglutide 18 MG/3ML Sopn  Inject 0.3 mLs (1.8 mg total) into the skin daily before breakfast.     metFORMIN 1000 MG tablet  Commonly known as:  GLUCOPHAGE  TAKE 1 TABLET BY MOUTH TWICE DAILY WITH A MEAL     multivitamin with minerals Tabs tablet  Take 1 tablet by mouth daily.     omeprazole 40 MG capsule  Commonly known as:  PRILOSEC  Take 1 capsule (40 mg total) by mouth daily.     ONETOUCH DELICA LANCETS FINE Misc  USE TO CHECK BLOOD SUGAR THREE TIMES A DAY     tadalafil 20 MG tablet  Commonly known as:  CIALIS  Take 1 tablet (20 mg total) by mouth daily as needed for erectile dysfunction.        Allergies:  Allergies  Allergen Reactions  . Cephalexin Swelling    In throat    Past Medical History  Diagnosis Date  . ATTENTION DEFICIT HYPERACTIVITY DISORDER 07/19/2006  . BENIGN PROSTATIC HYPERTROPHY 07/19/2006  . DIABETES MELLITUS, TYPE II 07/19/2006  . ERECTILE DYSFUNCTION 07/19/2006  . GERD 07/19/2006  . HEPATITIS C, CHRONIC 04/03/2007  . NEOPLASM, MALIGNANT, CARCINOMA, BASAL CELL, NOSE 10/20/2009  . Anemia     rec'd 32 units of blood, post T&A, 1981    Past Surgical History  Procedure Laterality Date  . Appendectomy    . Tonsillectomy    . Carotid injury  post tonsillectomy, R carotid  injury with trach  . Tracheostomy closure  1981  . Hernia repair  1973    inguinal- R side  . Lumbar laminectomy/decompression microdiscectomy Right 09/12/2012    Procedure: LUMBAR LAMINECTOMY/DECOMPRESSION MICRODISCECTOMY 1 LEVEL;  Surgeon: Charlie Pitter, MD;  Location: [facility];  Service: Neurosurgery;  Laterality: Right;  Right lumbar three-four microdiscectomy    Family History  Problem Relation Age of Onset  . Diabetes Maternal Aunt   . Cancer Paternal Grandmother     colon cancer  . Diabetes Maternal Aunt     Social History:  reports that he has quit smoking. He quit smokeless tobacco use about 8 years ago. His alcohol and drug histories are not on file. No alcohol   Review of Systems       Lipids: Not taking any statin drugs.  Last LDL below 100, has low HDL Has been followed by PCP       Lab Results  Component Value Date   CHOL 152 11/27/2014   HDL 34.00* 11/27/2014   LDLCALC 91 11/27/2014   TRIG 134.0 11/27/2014   CHOLHDL 4 11/27/2014         Has history of erectile dysfunction and has been prescribed Cialis     Taking Adderall for ADHD  Eye exam    LABS:  No visits with results within 1 Week(s) from this visit. Latest known visit with results is:  Lab on 03/10/2015  Component Date Value Ref Range Status  . Sodium 03/10/2015 138  135 - 145 mEq/L Final  . Potassium 03/10/2015 4.1  3.5 - 5.1 mEq/L Final  . Chloride 03/10/2015 104  96 - 112 mEq/L Final  . CO2 03/10/2015 25  19 - 32 mEq/L Final  . Glucose, Bld 03/10/2015 122* 70 - 99 mg/dL Final  . BUN 03/10/2015 18  6 - 23 mg/dL Final  . Creatinine, Ser 03/10/2015 0.98  0.40 - 1.50 mg/dL Final  . Total Bilirubin 03/10/2015 0.7  0.2 - 1.2 mg/dL Final  . Alkaline Phosphatase 03/10/2015 63  39 - 117 U/L Final  . AST 03/10/2015 28  0 - 37 U/L Final  . ALT 03/10/2015 34  0 - 53 U/L Final  . Total Protein 03/10/2015 6.9  6.0 - 8.3 g/dL Final  . Albumin 03/10/2015 4.4  3.5 - 5.2 g/dL Final  . Calcium  03/10/2015 9.4  8.4 - 10.5 mg/dL Final  . GFR 03/10/2015 82.95  >60.00 mL/min Final  . Hgb A1c MFr Bld 03/10/2015 5.8  4.6 - 6.5 % Final   Glycemic Control Guidelines for People with Diabetes:Non Diabetic:  <6%Goal of Therapy: <7%Additional Action Suggested:  >8%   . Color, Urine 03/10/2015 YELLOW  Yellow;Lt. Yellow Final  . APPearance 03/10/2015 CLEAR  Clear Final  . Specific Gravity, Urine 03/10/2015 1.025  1.000-1.030 Final  . pH 03/10/2015 5.5  5.0 - 8.0 Final  . Total Protein, Urine 03/10/2015 NEGATIVE  Negative Final  . Urine Glucose 03/10/2015 >=1000* Negative Final  . Ketones, ur 03/10/2015 NEGATIVE  Negative Final  . Bilirubin Urine 03/10/2015 NEGATIVE  Negative Final  . Hgb urine dipstick 03/10/2015 NEGATIVE  Negative Final  . Urobilinogen, UA 03/10/2015 0.2  0.0 - 1.0 Final  . Leukocytes, UA 03/10/2015 NEGATIVE  Negative Final  . Nitrite 03/10/2015 NEGATIVE  Negative Final  . WBC, UA 03/10/2015 none seen  0-2/hpf Final  . RBC / HPF 03/10/2015 none seen  0-2/hpf Final    Physical Examination:  BP  128/82 mmHg  Pulse 93  Temp(Src) 97.8 F (36.6 C)  Resp 14  Ht 5\' 7"  (1.702 m)  Wt 195 lb 3.2 oz (88.542 kg)  BMI 30.57 kg/m2  SpO2 97%    ASSESSMENT/PLAN  Diabetes type 2, uncontrolled     See history of present illness for details of current management, blood sugar patterns  Although he has not brought his blood sugar monitor on his visits and he is probably not checking his blood sugars and  off he still has fairly good blood sugars with A1c consistently upper normal Has had fairly stable blood sugars with using Invokana He is also usually trying to walk up to 3 miles a day  Only recently he appears to be occasional nocturnal hypoglycemia, also since fasting readings a low-normal he can easily reduce his bedtime NPH by 2 units He needs to adjusted further based on his overnight blood sugars Encouraged him to check more readings after meals to help adjust his mealtime  doses which he is not doing currently  Otherwise he can continue his management unchanged including metformin, Invokana and Victoza    Dandy Lazaro 03/20/2015, 8:06 AM   Note: This office note was prepared with Estate agent. Any transcriptional errors that result from this process are unintentional.

## 2015-03-19 NOTE — Patient Instructions (Signed)
Check blood sugars on waking up 3  times a week  Also check blood sugars about 2 hours after a meal and do this after different meals by rotation  Recommended blood sugar levels on waking up is 90-130 and about 2 hours after meal is 130-160  Please bring your blood sugar monitor to each visit, thank you  

## 2015-04-14 ENCOUNTER — Telehealth: Payer: Self-pay | Admitting: Internal Medicine

## 2015-04-14 MED ORDER — AMPHETAMINE-DEXTROAMPHETAMINE 20 MG PO TABS: 20.0000 mg | ORAL_TABLET | Freq: Three times a day (TID) | ORAL | Status: DC

## 2015-04-14 MED ORDER — AMPHETAMINE-DEXTROAMPHET ER 30 MG PO CP24: 30.0000 mg | ORAL_CAPSULE | Freq: Every day | ORAL | Status: DC

## 2015-04-14 MED ORDER — AMPHETAMINE-DEXTROAMPHET ER 30 MG PO CP24: 30.0000 mg | ORAL_CAPSULE | ORAL | Status: DC

## 2015-04-14 NOTE — Telephone Encounter (Signed)
Left message on voicemail Rx's ready for pickup will be at the front desk. Rx's printed and signed.

## 2015-04-14 NOTE — Telephone Encounter (Signed)
Pt needs new rxs generic adderall xr 30 mg and generic adderall 20 mg °

## 2015-05-08 ENCOUNTER — Telehealth: Payer: Self-pay | Admitting: Endocrinology

## 2015-05-08 ENCOUNTER — Other Ambulatory Visit: Payer: Self-pay | Admitting: *Deleted

## 2015-05-08 MED ORDER — LIRAGLUTIDE 18 MG/3ML ~~LOC~~ SOPN: 1.8000 mg | PEN_INJECTOR | Freq: Every day | SUBCUTANEOUS | Status: DC

## 2015-05-08 NOTE — Telephone Encounter (Signed)
[facility].

## 2015-05-08 NOTE — Telephone Encounter (Signed)
Pt needs Victoza 90 days supply sent to [facility]

## 2015-07-20 ENCOUNTER — Telehealth: Payer: Self-pay | Admitting: Internal Medicine

## 2015-07-20 MED ORDER — AMPHETAMINE-DEXTROAMPHETAMINE 20 MG PO TABS: 20.0000 mg | ORAL_TABLET | Freq: Three times a day (TID) | ORAL | Status: DC

## 2015-07-20 MED ORDER — AMPHETAMINE-DEXTROAMPHET ER 30 MG PO CP24: 30.0000 mg | ORAL_CAPSULE | ORAL | Status: DC

## 2015-07-20 MED ORDER — AMPHETAMINE-DEXTROAMPHET ER 30 MG PO CP24: 30.0000 mg | ORAL_CAPSULE | Freq: Every day | ORAL | Status: DC

## 2015-07-20 NOTE — Telephone Encounter (Signed)
Pt request refill of the following:  amphetamine-dextroamphetamine (ADDERALL XR) 30 MG 24 hr capsule ,  amphetamine-dextroamphetamine (ADDERALL) 20 MG tablet  Pt also ask if you have any samples of VICTOZA    Phamacy:

## 2015-07-20 NOTE — Telephone Encounter (Signed)
Left message on voicemail Rx's ready for pickup will be at the front desk. Also have sample of Victoza for you,just ask for Dr.K's nurse at the front and I will bring out to you. Rx's printed and signed.

## 2015-07-28 ENCOUNTER — Telehealth: Payer: Self-pay | Admitting: Endocrinology

## 2015-07-28 MED ORDER — METFORMIN HCL 1000 MG PO TABS: ORAL_TABLET | ORAL | Status: DC

## 2015-07-28 MED ORDER — INSULIN PEN NEEDLE 32G X 4 MM MISC: Status: DC

## 2015-07-28 NOTE — Telephone Encounter (Signed)
Patient need a refill of medication Insulin Pen Needle (BD PEN NEEDLE NANO U/F) 32G X 4 MM MISC, metFORMIN (GLUCOPHAGE) 1000 MG tablet send to pharmacy,  [place], [place] [phone] (Phone) [phone] (Fax)

## 2015-07-28 NOTE — Telephone Encounter (Signed)
Rx submitted

## 2015-07-31 ENCOUNTER — Telehealth: Payer: Self-pay | Admitting: Endocrinology

## 2015-07-31 MED ORDER — LIRAGLUTIDE 18 MG/3ML ~~LOC~~ SOPN: 1.8000 mg | PEN_INJECTOR | Freq: Every day | SUBCUTANEOUS | Status: DC

## 2015-07-31 NOTE — Telephone Encounter (Signed)
Pt needs refills to [facility] for victoza please

## 2015-07-31 NOTE — Addendum Note (Signed)
Addended by: Verlin Grills T on: 07/31/2015 02:30 PM   Modules accepted: Orders

## 2015-07-31 NOTE — Telephone Encounter (Signed)
Rx submitted per pt's request.  

## 2015-08-06 ENCOUNTER — Telehealth: Payer: Self-pay | Admitting: Endocrinology

## 2015-08-06 NOTE — Telephone Encounter (Signed)
See below. Ok to proceed?

## 2015-08-06 NOTE — Telephone Encounter (Signed)
Trulicity should work just as well as the [employer].  If this is covered  he can start 1.5 mg weekly

## 2015-08-06 NOTE — Telephone Encounter (Signed)
Patient stated insurance will not cover his medication Victoza.patient stated that medication works very well for him. Dr Dwyane Dee will have to call [facility] for a clinical exception.

## 2015-08-07 MED ORDER — DULAGLUTIDE 1.5 MG/0.5ML ~~LOC~~ SOAJ: 1.5000 mg | SUBCUTANEOUS | Status: DC

## 2015-08-07 NOTE — Addendum Note (Signed)
Addended by: Verlin Grills T on: 08/07/2015 09:59 AM   Modules accepted: Orders

## 2015-08-07 NOTE — Telephone Encounter (Signed)
I contacted the pt and he stated he received a letter from his insurance company stating the trulicity is covered. Rx submitted the pt's local pharmacy per his request.

## 2015-08-19 ENCOUNTER — Telehealth: Payer: Self-pay | Admitting: Internal Medicine

## 2015-08-19 MED ORDER — OMEPRAZOLE 20 MG PO CPDR: 20.0000 mg | DELAYED_RELEASE_CAPSULE | Freq: Every day | ORAL | Status: DC

## 2015-08-19 NOTE — Telephone Encounter (Signed)
Pt would like to have new rx prilosec 20 mg #90 w/refill send to [facility] on [address]. Pt has been buying med over the counter for heart burns

## 2015-08-19 NOTE — Telephone Encounter (Signed)
Spoke to pt, told him Rx sent to pharmacy as requested. Pt verbalized understanding.

## 2015-11-01 ENCOUNTER — Other Ambulatory Visit: Payer: Self-pay | Admitting: Endocrinology

## 2015-11-02 ENCOUNTER — Telehealth: Payer: Self-pay | Admitting: Internal Medicine

## 2015-11-02 MED ORDER — AMPHETAMINE-DEXTROAMPHETAMINE 20 MG PO TABS: 20.0000 mg | ORAL_TABLET | Freq: Three times a day (TID) | ORAL | 0 refills | Status: DC

## 2015-11-02 MED ORDER — AMPHETAMINE-DEXTROAMPHET ER 30 MG PO CP24: 30.0000 mg | ORAL_CAPSULE | ORAL | 0 refills | Status: DC

## 2015-11-02 MED ORDER — AMPHETAMINE-DEXTROAMPHET ER 30 MG PO CP24: 30.0000 mg | ORAL_CAPSULE | Freq: Every day | ORAL | 0 refills | Status: DC

## 2015-11-02 NOTE — Telephone Encounter (Signed)
Left message on voicemail Rx's ready for pickup will be at the front desk. Rx's printed and signed.

## 2015-11-02 NOTE — Telephone Encounter (Signed)
Pt needs new rx generic adderall xr 30 and generic adderall 20 mg

## 2015-11-12 ENCOUNTER — Encounter: Payer: Self-pay | Admitting: Family Medicine

## 2015-11-12 ENCOUNTER — Ambulatory Visit (INDEPENDENT_AMBULATORY_CARE_PROVIDER_SITE_OTHER): Payer: Managed Care, Other (non HMO) | Admitting: Family Medicine

## 2015-11-12 VITALS — BP 144/84 | HR 93 | Temp 97.6°F | Wt 195.0 lb

## 2015-11-12 DIAGNOSIS — M25561 Pain in right knee: Secondary | ICD-10-CM | POA: Diagnosis not present

## 2015-11-12 MED ORDER — DICLOFENAC SODIUM 75 MG PO TBEC: 75.0000 mg | DELAYED_RELEASE_TABLET | Freq: Two times a day (BID) | ORAL | 0 refills | Status: DC

## 2015-11-12 NOTE — Progress Notes (Signed)
Pre visit review using our clinic review tool, if applicable. No additional management support is needed unless otherwise documented below in the visit note. 

## 2015-11-12 NOTE — Patient Instructions (Signed)
Please take medication as prescribed and follow up in one week if symptoms do not improve, worsen, or you develop new symptoms. Also, if you notice any worsening symptoms, please seek medical attention.  Knee Pain Knee pain is a very common symptom and can have many causes. Knee pain often goes away when you follow your health care provider's instructions for relieving pain and discomfort at home. However, knee pain can develop into a condition that needs treatment. Some conditions may include:  Arthritis caused by wear and tear (osteoarthritis).  Arthritis caused by swelling and irritation (rheumatoid arthritis or gout).  A cyst or growth in your knee.  An infection in your knee joint.  An injury that will not heal.  Damage, swelling, or irritation of the tissues that support your knee (torn ligaments or tendinitis). If your knee pain continues, additional tests may be ordered to diagnose your condition. Tests may include X-rays or other imaging studies of your knee. You may also need to have fluid removed from your knee. Treatment for ongoing knee pain depends on the cause, but treatment may include:  Medicines to relieve pain or swelling.  Steroid injections in your knee.  Physical therapy.  Surgery. HOME CARE INSTRUCTIONS  Take medicines only as directed by your health care provider.  Rest your knee and keep it raised (elevated) while you are resting.  Do not do things that cause or worsen pain.  Avoid high-impact activities or exercises, such as running, jumping rope, or doing jumping jacks.  Apply ice to the knee area:  Put ice in a plastic bag.  Place a towel between your skin and the bag.  Leave the ice on for 20 minutes, 2-3 times a day.  Ask your health care provider if you should wear an elastic knee support.  Keep a pillow under your knee when you sleep.  Lose weight if you are overweight. Extra weight can put pressure on your knee.  Do not use any tobacco  products, including cigarettes, chewing tobacco, or electronic cigarettes. If you need help quitting, ask your health care provider. Smoking may slow the healing of any bone and joint problems that you may have. SEEK MEDICAL CARE IF:  Your knee pain continues, changes, or gets worse.  You have a fever along with knee pain.  Your knee buckles or locks up.  Your knee becomes more swollen. SEEK IMMEDIATE MEDICAL CARE IF:   Your knee joint feels hot to the touch.  You have chest pain or trouble breathing.   This information is not intended to replace advice given to you by your health care provider. Make sure you discuss any questions you have with your health care provider.   Document Released: 11/28/2006 Document Revised: 02/21/2014 Document Reviewed: 09/16/2013 Elsevier Interactive Patient Education [employer].

## 2015-11-12 NOTE — Progress Notes (Signed)
Subjective:    Patient ID: Michael Li, male    DOB: 1955/11/05, 60 y.o.   MRN: [id number]  HPI  Michael Li is a 60 year old male who presents today for right knee pain that started 2 weeks ago. He reports that this started gradually and is worse today. He reports that pain as a 5 to 6 and notes this as aching and he is currently wearing a copper fit brace on right knee.  No associated triggers, new activities, or trauma reported.  He denies fever, chills, sweats, nigh sweats, fatigue, edema, rash, or any popping sounds or edema. No history of previous knee injury present. Pain is exacerbated when getting up after being seated for a period of time, it is not relieved with ambulation.  Treatment at home includes aleve BID which has provided limited benefit.     Review of Systems  Constitutional: Negative for chills, fatigue and fever.  Respiratory: Negative for cough, shortness of breath and wheezing.   Cardiovascular: Negative for chest pain and palpitations.  Gastrointestinal: Negative for abdominal pain, diarrhea, nausea and vomiting.  Musculoskeletal:       Knee pain  Skin: Negative for rash.  Neurological: Negative for dizziness, light-headedness and headaches.   Past Medical History:  Diagnosis Date  . Anemia    rec'd 32 units of blood, post T&A, 1981  . ATTENTION DEFICIT HYPERACTIVITY DISORDER 07/19/2006  . BENIGN PROSTATIC HYPERTROPHY 07/19/2006  . DIABETES MELLITUS, TYPE II 07/19/2006  . ERECTILE DYSFUNCTION 07/19/2006  . GERD 07/19/2006  . HEPATITIS C, CHRONIC 04/03/2007  . NEOPLASM, MALIGNANT, CARCINOMA, BASAL CELL, NOSE 10/20/2009     Social History   Social History  . Marital status: Single    Spouse name: N/A  . Number of children: 2  . 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 Smoking status: Former Research scientist (life sciences)  . Smokeless tobacco: Former Systems developer    Quit date: 02/15/2007  . Alcohol use Not on  file  . Drug use: Unknown  . Sexual activity: Not on file   Other Topics Concern  . Not on file   Social History Narrative   Regular exercise-yes    Past Surgical History:  Procedure Laterality Date  . APPENDECTOMY    . carotid injury       post tonsillectomy, R carotid injury with trach  . HERNIA REPAIR  1973   inguinal- R side  . LUMBAR LAMINECTOMY/DECOMPRESSION MICRODISCECTOMY Right 09/12/2012   Procedure: LUMBAR LAMINECTOMY/DECOMPRESSION MICRODISCECTOMY 1 LEVEL;  Surgeon: Charlie Pitter, MD;  Location: [facility];  Service: Neurosurgery;  Laterality: Right;  Right lumbar three-four microdiscectomy  . TONSILLECTOMY    . TRACHEOSTOMY CLOSURE  1981    Family History  Problem Relation Age of Onset  . Diabetes Maternal Aunt   . Cancer Paternal Grandmother     colon cancer  . Diabetes Maternal Aunt     Allergies  Allergen Reactions  . Cephalexin Swelling    In throat    Current Outpatient Prescriptions on File Prior to Visit  Medication Sig Dispense Refill  . amphetamine-dextroamphetamine (ADDERALL XR) 30 MG 24 hr capsule Take 1 capsule (30 mg total) by mouth every morning. 30 capsule 0  . amphetamine-dextroamphetamine (ADDERALL XR) 30 MG 24 hr capsule Take 1 capsule (30 mg total) by mouth daily. 30 capsule 0  . amphetamine-dextroamphetamine (ADDERALL XR) 30 MG 24 hr capsule Take 1 capsule (  30 mg total) by mouth daily. 30 capsule 0  . amphetamine-dextroamphetamine (ADDERALL) 20 MG tablet Take 1 tablet (20 mg total) by mouth 3 (three) times daily. 90 tablet 0  . amphetamine-dextroamphetamine (ADDERALL) 20 MG tablet Take 1 tablet (20 mg total) by mouth 3 (three) times daily. 90 tablet 0  . amphetamine-dextroamphetamine (ADDERALL) 20 MG tablet Take 1 tablet (20 mg total) by mouth 3 (three) times daily. 90 tablet 0  . aspirin 81 MG tablet Take 81 mg by mouth daily.      . canagliflozin (INVOKANA) 100 MG TABS tablet TAKE 1 TABLET BY MOUTH EVERY DAY BEFORE BREAKFAST 30 tablet 5    . glucose blood (ONETOUCH VERIO) test strip Use as instructed to check blood sugar 3 times per day dx code E11.65 300 each 1  . ibuprofen (ADVIL,MOTRIN) 800 MG tablet   1  . Insulin Lispro, Human, (HUMALOG KWIKPEN) 200 UNIT/ML SOPN Inject 64 Units into the skin 3 (three) times daily. Inject 22 units at breakfast and lunch and 20 units before dinner (Patient taking differently: Inject into the skin 3 (three) times daily. Inject 15 units at breakfast and lunch and 18 units before dinner) 60 mL 1  . Insulin NPH, Human,, Isophane, (HUMULIN N) 100 UNIT/ML Kiwkpen Inject 42 Units into the skin at bedtime. 45 mL 3  . Insulin Pen Needle (BD PEN NEEDLE NANO U/F) 32G X 4 MM MISC USE 5 PER DAY TO INJECT INSULIN AND VICTOZA 450 each 3  . Insulin Syringe-Needle U-100 (INSULIN SYRINGE .5CC/31GX5/16") 31G X 5/16" 0.5 ML MISC Inject 1 each as directed daily. 100 each 3  . Liraglutide 18 MG/3ML SOPN Inject 0.3 mLs (1.8 mg total) into the skin daily before breakfast. 27 mL 1  . metFORMIN (GLUCOPHAGE) 1000 MG tablet TAKE 1 TABLET BY MOUTH TWICE DAILY WITH A MEAL 180 tablet 3  . Multiple Vitamin (MULTIVITAMIN WITH MINERALS) TABS Take 1 tablet by mouth daily.    Marland Kitchen omeprazole (PRILOSEC) 20 MG capsule Take 1 capsule (20 mg total) by mouth daily. 90 capsule 3  . ONETOUCH DELICA LANCETS FINE MISC USE TO CHECK BLOOD SUGAR THREE TIMES A DAY 300 each 0  . tadalafil (CIALIS) 20 MG tablet Take 1 tablet (20 mg total) by mouth daily as needed for erectile dysfunction. 10 tablet 1  . TRULICITY 1.5 0000000 SOPN INJECT 1.5 MG( 0.5 ML) UNDER THE SKIN ONCE A WEEK 2 mL 0  . [DISCONTINUED] insulin aspart (NOVOLOG FLEXPEN) 100 unit/mL SOLN FlexPen Inject subcutaneously (just before each meal) 22-22-20 units 25 pen 3   No current facility-administered medications on file prior to visit.     BP (!) 144/84 (BP Location: Right Arm, Patient Position: Sitting, Cuff Size: Normal)   Pulse 93   Temp 97.6 F (36.4 C) (Oral)   Wt 195 lb  (88.5 kg)   SpO2 97%   BMI 30.54 kg/m       Objective:   Physical Exam  Constitutional: He is oriented to person, place, and time. He appears well-developed and well-nourished.  Eyes: Pupils are equal, round, and reactive to light.  Neck: Neck supple.  Cardiovascular: Normal rate and regular rhythm.   Pulmonary/Chest: Effort normal and breath sounds normal. He has no wheezes.  Abdominal: Soft. Bowel sounds are normal. There is no tenderness.  Musculoskeletal: Normal range of motion.  Full ROM with knees bilaterally. Negative drawer test. No edema or erythema present.  No effusion appreciated. Mild crepitus noted in knees bilaterally  Lymphadenopathy:  He has no cervical adenopathy.  Neurological: He is alert and oriented to person, place, and time. Coordination normal.  Skin: Skin is warm and dry. No rash noted.  Psychiatric: He has a normal mood and affect. His behavior is normal. Judgment and thought content normal.        Assessment & Plan:  1. Right knee pain Full ROM in knees bilaterally with no erythema or edema present upon exam today. Suspect that symptom may be due to an inflammatory process such as  possible bursitis, osteoarthritis,  or patellar tendonitis. Advised a trial of diclofenac with food and if symptoms do not improve or worsen, imaging can be initiated. Patient voiced understanding and agreed with plan. - diclofenac (VOLTAREN) 75 MG EC tablet; Take 1 tablet (75 mg total) by mouth 2 (two) times daily.  Dispense: 30 tablet; Refill: 0  Delano Metz, FNP-C

## 2015-11-23 ENCOUNTER — Ambulatory Visit (INDEPENDENT_AMBULATORY_CARE_PROVIDER_SITE_OTHER)
Admission: RE | Admit: 2015-11-23 | Discharge: 2015-11-23 | Disposition: A | Discharge status: Home / Self Care | Payer: Managed Care, Other (non HMO) | Source: Ambulatory Visit | Attending: Family Medicine | Admitting: Family Medicine

## 2015-11-23 ENCOUNTER — Ambulatory Visit (INDEPENDENT_AMBULATORY_CARE_PROVIDER_SITE_OTHER): Payer: Managed Care, Other (non HMO) | Admitting: Family Medicine

## 2015-11-23 ENCOUNTER — Encounter: Payer: Self-pay | Admitting: Family Medicine

## 2015-11-23 VITALS — BP 146/82 | HR 89 | Resp 12 | Ht 67.0 in | Wt 197.2 lb

## 2015-11-23 DIAGNOSIS — M79604 Pain in right leg: Secondary | ICD-10-CM

## 2015-11-23 DIAGNOSIS — M25561 Pain in right knee: Secondary | ICD-10-CM

## 2015-11-23 DIAGNOSIS — R030 Elevated blood-pressure reading, without diagnosis of hypertension: Secondary | ICD-10-CM

## 2015-11-23 MED ORDER — DICLOFENAC SODIUM 1 % TD GEL: 4.0000 g | Freq: Four times a day (QID) | TRANSDERMAL | 0 refills | Status: DC

## 2015-11-23 MED ORDER — PREDNISONE 20 MG PO TABS: ORAL_TABLET | ORAL | 0 refills | Status: AC

## 2015-11-23 NOTE — Progress Notes (Signed)
HPI:  ACUTE VISIT:  Chief Complaint  Patient presents with  . Follow-up    knee; saw NP a week ago, has been taking aleeve and antiinflammatory without any relief. Brace helps some    [name] is a 60 y.o. male, who is here today complaining of a month of persistent right knee pain.   According to patient, he started with a new onset of medial right knee pain, "toothache"-like, states that it is 12/10 in intensity, constant, exacerbated by walking, going up or down stairs. Alleviated by rest and oral NSAIDs. Knee pain in the morning is mild, it is worse as the day goes. He denies prior history of knee pain.  For the past 3 days knee pain is radiated to right calf and  lateral aspect of the right hip, he has noted mild intermittent "little"  numbness sensation on lateral aspect of right thigh.  He denies LE edema or erythema. He denies saddle anesthesia, urine/bowel incontinence, or lower extremity weakness. History of lower back pain, which has been stable. He denies any injury or unusual activity. He was seen 11/12/15 because pain was getting worse, oral Diclofenac was recommended, he is also taken Aleve, pain is otherwise stable since then. Occasionally he feels like knee gave up, knee brace helps.  No other arthralgia.  He has not noted fever, chills, myalgias, or skin rash.   Lumbar MRI 08/2012: Moderately large right foraminal and right lateral disc protrusion L3-4 causing impingement of the right L3 nerve root in the foramen. 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  There is  moderate left foraminal encroachment.  There is right lateral disc osteophyte Complex. Disc degeneration and spondylosis L5-S1 causing foraminal narrowing bilaterally left greater than right.   - BP mildly elevated today, he denies history of HTN. Denies severe/frequent headache, visual changes, chest pain, dyspnea, palpitation, claudication, focal weakness, or edema.  Hx of DM II,  reporting BS"s as "good."    Review of Systems  Constitutional: Negative for appetite change, chills, diaphoresis and fever.  HENT: Negative for mouth sores and nosebleeds.   Respiratory: Negative for cough, shortness of breath and wheezing.   Cardiovascular: Negative for chest pain and leg swelling.  Gastrointestinal: Negative for abdominal pain, nausea and vomiting.       No changes in bowel habits.  Genitourinary: Negative for decreased urine volume, difficulty urinating and hematuria.  Musculoskeletal: Positive for arthralgias, back pain and joint swelling.  Skin: Negative for color change and rash.  Neurological: Positive for numbness. Negative for weakness and headaches.      Current Outpatient Prescriptions on File Prior to Visit  Medication Sig Dispense Refill  . amphetamine-dextroamphetamine (ADDERALL XR) 30 MG 24 hr capsule Take 1 capsule (30 mg total) by mouth every morning. 30 capsule 0  . amphetamine-dextroamphetamine (ADDERALL XR) 30 MG 24 hr capsule Take 1 capsule (30 mg total) by mouth daily. 30 capsule 0  . amphetamine-dextroamphetamine (ADDERALL XR) 30 MG 24 hr capsule Take 1 capsule (30 mg total) by mouth daily. 30 capsule 0  . amphetamine-dextroamphetamine (ADDERALL) 20 MG tablet Take 1 tablet (20 mg total) by mouth 3 (three) times daily. 90 tablet 0  . amphetamine-dextroamphetamine (ADDERALL) 20 MG tablet Take 1 tablet (20 mg total) by mouth 3 (three) times daily. 90 tablet 0  . amphetamine-dextroamphetamine (ADDERALL) 20 MG tablet Take 1 tablet (20 mg total) by mouth 3 (three) times daily. 90 tablet 0  . aspirin 81 MG tablet Take 81  mg by mouth daily.      . canagliflozin (INVOKANA) 100 MG TABS tablet TAKE 1 TABLET BY MOUTH EVERY DAY BEFORE BREAKFAST 30 tablet 5  . glucose blood (ONETOUCH VERIO) test strip Use as instructed to check blood sugar 3 times per day dx code E11.65 300 each 1  . Insulin Lispro, Human, (HUMALOG KWIKPEN) 200 UNIT/ML SOPN Inject 64 Units  into the skin 3 (three) times daily. Inject 22 units at breakfast and lunch and 20 units before dinner (Patient taking differently: Inject into the skin 3 (three) times daily. Inject 15 units at breakfast and lunch and 18 units before dinner) 60 mL 1  . Insulin NPH, Human,, Isophane, (HUMULIN N) 100 UNIT/ML Kiwkpen Inject 42 Units into the skin at bedtime. 45 mL 3  . Insulin Pen Needle (BD PEN NEEDLE NANO U/F) 32G X 4 MM MISC USE 5 PER DAY TO INJECT INSULIN AND VICTOZA 450 each 3  . Insulin Syringe-Needle U-100 (INSULIN SYRINGE .5CC/31GX5/16") 31G X 5/16" 0.5 ML MISC Inject 1 each as directed daily. 100 each 3  . Liraglutide 18 MG/3ML SOPN Inject 0.3 mLs (1.8 mg total) into the skin daily before breakfast. 27 mL 1  . metFORMIN (GLUCOPHAGE) 1000 MG tablet TAKE 1 TABLET BY MOUTH TWICE DAILY WITH A MEAL 180 tablet 3  . Multiple Vitamin (MULTIVITAMIN WITH MINERALS) TABS Take 1 tablet by mouth daily.    Marland Kitchen omeprazole (PRILOSEC) 20 MG capsule Take 1 capsule (20 mg total) by mouth daily. 90 capsule 3  . ONETOUCH DELICA LANCETS FINE MISC USE TO CHECK BLOOD SUGAR THREE TIMES A DAY 300 each 0  . tadalafil (CIALIS) 20 MG tablet Take 1 tablet (20 mg total) by mouth daily as needed for erectile dysfunction. 10 tablet 1  . TRULICITY 1.5 0000000 SOPN INJECT 1.5 MG( 0.5 ML) UNDER THE SKIN ONCE A WEEK 2 mL 0  . [DISCONTINUED] insulin aspart (NOVOLOG FLEXPEN) 100 unit/mL SOLN FlexPen Inject subcutaneously (just before each meal) 22-22-20 units 25 pen 3   No current facility-administered medications on file prior to visit.      Past Medical History:  Diagnosis Date  . Anemia    rec'd 32 units of blood, post T&A, 1981  . ATTENTION DEFICIT HYPERACTIVITY DISORDER 07/19/2006  . BENIGN PROSTATIC HYPERTROPHY 07/19/2006  . DIABETES MELLITUS, TYPE II 07/19/2006  . ERECTILE DYSFUNCTION 07/19/2006  . GERD 07/19/2006  . HEPATITIS C, CHRONIC 04/03/2007  . NEOPLASM, MALIGNANT, CARCINOMA, BASAL CELL, NOSE 10/20/2009   Allergies    Allergen Reactions  . Cephalexin Swelling    In throat    Social History   Social History  . Marital status: Single    Spouse name: N/A  . Number of children: 2  . 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 Smoking status: Former Research scientist (life sciences)  . Smokeless tobacco: Former Systems developer    Quit date: 02/15/2007  . Alcohol use None  . Drug use: Unknown  . Sexual activity: Not Asked   Other Topics Concern  . None   Social History Narrative   Regular exercise-yes    Vitals:   11/23/15 1439  BP: (!) 146/82  Pulse: 89  Resp: 12   Body mass index is 30.88 kg/m.    Physical Exam  Nursing note and vitals reviewed. Constitutional: He is oriented to person, place, and time. He appears well-developed. He does not appear ill. No distress.  HENT:  Head: Atraumatic.  Eyes: Conjunctivae are normal.  Cardiovascular: Normal rate and regular rhythm.   No murmur heard. Pulses:      Posterior tibial pulses are 2+ on the right side.  Respiratory: Effort normal and breath sounds normal. No respiratory distress.  Musculoskeletal: He exhibits no edema.       Right lower leg: He exhibits no tenderness.  Left knee: on inspection mild effusion,no erythema or deformities. Valgus and varus stress normal, McMurray negative, anterior and posterior drawer test negative. Tenderness upon palpation of medial interarticular line. Patellar apprehension test negative. + Mild crepitus. Normal ROM. No tenderness upon palpation of lumbar paraspinal muscles. Mildly antalgic gait.  Neurological: He is alert and oriented to person, place, and time. He has normal strength.  Stable gait, no assistance needed. SLR negative bilateral.  Skin: Skin is warm. No rash noted. No erythema.  Psychiatric: He has a normal mood and affect.  Well groomed, good eye contact.      ASSESSMENT AND PLAN:     Michael Li was seen today for  follow-up.  Diagnoses and all orders for this visit:  Right medial knee pain  Possible causes discussed: referred pain, OA, sprain. Topical Voltaren. Acetaminophen 650 mg tid. Local ice/heat. Relative rest, avoid activities that exacerbate pain. Further recommendations will be given according to knee x-ray.  -     DG Knee Complete 4 Views Right; Future -     diclofenac sodium (VOLTAREN) 1 % GEL; Apply 4 g topically 4 (four) times daily.  Pain of right lower extremity  ? Radicular pain. Some side effects of oral Prednisone discussed, monitor BS's closely. Instructed about warning signs. F/U as needed.  -     predniSONE (DELTASONE) 20 MG tablet; 2 tabs for 3 days , 1 tab for 3 days, 1/2 tab for 3 days. Take medication with breakfast.   Elevated blood pressure reading  Re-checked 145/80. Avoid oral NSAIDs, some side effects discussed. Monitor BP at home. Continue following with PCP.      Return in about 3 weeks (around 12/14/2015) for right knee pain and BP (PCP).     -[name] was advised to return or notify a doctor immediately if symptoms worsen or new concerns arise, otherwise he will follow with PCP as recommended.       Farin Buhman G. [place], MD  [facility]. [place] office.

## 2015-11-23 NOTE — Patient Instructions (Signed)
  [name] I have seen you today for an acute visit because your primary care provider was not available.   1. Right medial knee pain Osteoarthritis, ligament sprain.  Topical capsaicin, local heat/ice, topical diclofenac.  - DG Knee Complete 4 Views Right; Future - diclofenac sodium (VOLTAREN) 1 % GEL; Apply 4 g topically 4 (four) times daily.  Dispense: 5 Tube; Refill: 0  2. Elevated blood pressure reading  145/80  Monitor blood pressure at home.   3. Pain of right lower extremity Question of relation with back pain. PREDNISONE recommended, monitor blood sugars.   - predniSONE (DELTASONE) 20 MG tablet; 2 tabs for 3 days , 1 tab for 3 days, 1/2 tab for 3 days. Take medication with breakfast.  Dispense: 12 tablet; Refill: 0    Monitor for signs of worsening symptoms and seek immediate medical attention if any concerning/warning symptom as we discussed.   If symptoms are not resolved in 1-2 weeks you should schedule a follow up appointment with your doctor, before if symptoms get worse.  Please be sure you have an appointment already scheduled with your PCP.

## 2015-11-23 NOTE — Progress Notes (Signed)
Pre visit review using our clinic review tool, if applicable. No additional management support is needed unless otherwise documented below in the visit note. 

## 2015-11-24 ENCOUNTER — Encounter: Payer: Self-pay | Admitting: Family Medicine

## 2015-12-03 ENCOUNTER — Telehealth: Payer: Self-pay | Admitting: Endocrinology

## 2015-12-03 ENCOUNTER — Other Ambulatory Visit: Payer: Self-pay | Admitting: *Deleted

## 2015-12-03 MED ORDER — INSULIN ISOPHANE HUMAN 100 UNIT/ML KWIKPEN: 42.0000 [IU] | PEN_INJECTOR | Freq: Every day | SUBCUTANEOUS | 0 refills | Status: DC

## 2015-12-03 NOTE — Telephone Encounter (Signed)
Please let him know I will send in an rx, but he has not been seen since February and must have an appointment.

## 2015-12-03 NOTE — Telephone Encounter (Signed)
Patient requesting a refill on insulin pen patient stated he left voicemail Tuesday afternoon has not hear back.needs to be called into [facility]. Patient is completely out of insulin last dose was Tuesday night  [phone]

## 2015-12-03 NOTE — Telephone Encounter (Signed)
Patient scheduled for 12/24/15

## 2015-12-14 ENCOUNTER — Other Ambulatory Visit: Payer: Self-pay | Admitting: Endocrinology

## 2015-12-24 ENCOUNTER — Encounter: Payer: Self-pay | Admitting: Endocrinology

## 2015-12-24 ENCOUNTER — Other Ambulatory Visit: Payer: Self-pay | Admitting: Endocrinology

## 2015-12-24 ENCOUNTER — Encounter: Payer: Managed Care, Other (non HMO) | Admitting: Endocrinology

## 2015-12-24 DIAGNOSIS — E119 Type 2 diabetes mellitus without complications: Secondary | ICD-10-CM

## 2015-12-24 LAB — COMPREHENSIVE METABOLIC PANEL
ALT: 26 U/L (ref 0–53)
AST: 21 U/L (ref 0–37)
Albumin: 4.3 g/dL (ref 3.5–5.2)
Alkaline Phosphatase: 62 U/L (ref 39–117)
BUN: 25 mg/dL — ABNORMAL HIGH (ref 6–23)
CO2: 26 mEq/L (ref 19–32)
Calcium: 9.8 mg/dL (ref 8.4–10.5)
Chloride: 105 mEq/L (ref 96–112)
Creatinine, Ser: 1.39 mg/dL (ref 0.40–1.50)
GFR: 55.27 mL/min — ABNORMAL LOW (ref >60.00)
Glucose, Bld: 143 mg/dL — ABNORMAL HIGH (ref 70–99)
Potassium: 4 mEq/L (ref 3.5–5.1)
Sodium: 138 mEq/L (ref 135–145)
Total Bilirubin: 0.7 mg/dL (ref 0.2–1.2)
Total Protein: 6.9 g/dL (ref 6.0–8.3)

## 2015-12-24 LAB — URINALYSIS, ROUTINE W REFLEX MICROSCOPIC
Bilirubin Urine: NEGATIVE
Hgb urine dipstick: NEGATIVE
Ketones, ur: NEGATIVE
Leukocytes, UA: NEGATIVE
Nitrite: NEGATIVE
RBC / HPF: NONE SEEN (ref 0–?)
Specific Gravity, Urine: 1.015 (ref 1.000–1.030)
Total Protein, Urine: NEGATIVE
Urine Glucose: >=1000 — AB
Urobilinogen, UA: 0.2 (ref 0.0–1.0)
WBC, UA: NONE SEEN (ref 0–?)
pH: 5.5 (ref 5.0–8.0)

## 2015-12-24 LAB — MICROALBUMIN / CREATININE URINE RATIO
Creatinine,U: 90.5 mg/dL
Microalb Creat Ratio: 1.9 mg/g (ref 0.0–30.0)
Microalb, Ur: 1.7 mg/dL (ref 0.0–1.9)

## 2015-12-24 LAB — LIPID PANEL
Cholesterol: 140 mg/dL (ref 0–200)
HDL: 42.3 mg/dL (ref >39.00)
LDL Cholesterol: 84 mg/dL (ref 0–99)
NonHDL: 97.38
Total CHOL/HDL Ratio: 3
Triglycerides: 69 mg/dL (ref 0.0–149.0)
VLDL: 13.8 mg/dL (ref 0.0–40.0)

## 2015-12-24 LAB — HEMOGLOBIN A1C: Hgb A1c MFr Bld: 6.2 % (ref 4.6–6.5)

## 2015-12-25 NOTE — Progress Notes (Signed)
This encounter was created in error - please disregard.

## 2016-01-11 ENCOUNTER — Other Ambulatory Visit: Payer: Self-pay | Admitting: Endocrinology

## 2016-01-17 ENCOUNTER — Other Ambulatory Visit: Payer: Self-pay | Admitting: Endocrinology

## 2016-01-18 ENCOUNTER — Other Ambulatory Visit: Payer: Self-pay | Admitting: Orthopedic Surgery

## 2016-01-18 DIAGNOSIS — M25561 Pain in right knee: Secondary | ICD-10-CM

## 2016-01-27 ENCOUNTER — Ambulatory Visit
Admission: RE | Admit: 2016-01-27 | Discharge: 2016-01-27 | Disposition: A | Discharge status: Home / Self Care | Payer: Managed Care, Other (non HMO) | Source: Ambulatory Visit | Attending: Orthopedic Surgery | Admitting: Orthopedic Surgery

## 2016-01-27 DIAGNOSIS — M25561 Pain in right knee: Secondary | ICD-10-CM

## 2016-02-02 ENCOUNTER — Telehealth: Payer: Self-pay | Admitting: Internal Medicine

## 2016-02-02 MED ORDER — AMPHETAMINE-DEXTROAMPHET ER 30 MG PO CP24: 30.0000 mg | ORAL_CAPSULE | Freq: Every day | ORAL | 0 refills | Status: DC

## 2016-02-02 MED ORDER — AMPHETAMINE-DEXTROAMPHETAMINE 20 MG PO TABS: 20.0000 mg | ORAL_TABLET | Freq: Three times a day (TID) | ORAL | 0 refills | Status: DC

## 2016-02-02 MED ORDER — AMPHETAMINE-DEXTROAMPHET ER 30 MG PO CP24: 30.0000 mg | ORAL_CAPSULE | ORAL | 0 refills | Status: DC

## 2016-02-02 NOTE — Telephone Encounter (Signed)
Pt request refill amphetamine-dextroamphetamine (ADDERALL XR) 30 MG 24 hr capsule amphetamine-dextroamphetamine (ADDERALL) 20 MG tablet 3 mo supply

## 2016-02-02 NOTE — Telephone Encounter (Signed)
Left message on voicemail Rx's ready for pickup, will be at the front desk. Rx's printed and signed. 3 month supply given pt must have physical before more refills.

## 2016-02-05 ENCOUNTER — Telehealth: Payer: Self-pay | Admitting: Endocrinology

## 2016-02-05 ENCOUNTER — Ambulatory Visit (INDEPENDENT_AMBULATORY_CARE_PROVIDER_SITE_OTHER): Payer: Managed Care, Other (non HMO) | Admitting: Orthopaedic Surgery

## 2016-02-05 ENCOUNTER — Other Ambulatory Visit: Payer: Self-pay | Admitting: Endocrinology

## 2016-02-05 ENCOUNTER — Encounter (INDEPENDENT_AMBULATORY_CARE_PROVIDER_SITE_OTHER): Payer: Self-pay | Admitting: Orthopaedic Surgery

## 2016-02-05 DIAGNOSIS — S83241A Other tear of medial meniscus, current injury, right knee, initial encounter: Secondary | ICD-10-CM

## 2016-02-05 DIAGNOSIS — S83249A Other tear of medial meniscus, current injury, unspecified knee, initial encounter: Secondary | ICD-10-CM | POA: Insufficient documentation

## 2016-02-05 MED ORDER — INSULIN ISOPHANE HUMAN 100 UNIT/ML KWIKPEN: 42.0000 [IU] | PEN_INJECTOR | Freq: Every day | SUBCUTANEOUS | 0 refills | Status: DC

## 2016-02-05 NOTE — Telephone Encounter (Signed)
Pt called in requesting refills of his Humulin N be sent to [facility], and his Invokana be sent to [facility] on [address].

## 2016-02-05 NOTE — Telephone Encounter (Signed)
Spoke with patient and advised patient must have a visit for further refills on his Humulin 100 U and his Invokana. Both were sent to pharmacy with zero refills

## 2016-02-05 NOTE — Progress Notes (Signed)
Office Visit Note   Patient: Michael Li           Date of Birth: February 25, 1955           MRN: [id number] Visit Date: 02/05/2016              Requested by: Marletta Lor, MD [place], [place] [address] PCP: Nyoka Cowden, MD   Assessment & Plan: Visit Diagnoses:  1. Acute medial meniscus tear of right knee, initial encounter     Plan: Patient's had persistent pain and catching effusion sharp medial joint line pain with the intermittent catching of his knee. He  taken anti-inflammatory medications without relief and has a displaced posterior meniscal tear. Patient's an intra-articular injection which did not give him relief. It wakes him up at night and he said repetitive catching. Patient states she like proceed with outpatient arthroscopy. MRI scan was reviewed. Is a displaced medial meniscal tear but also has some edema on T2 weighted images of the medial femoral condyle as well as the area of chondral wear near the posterior portion medial femoral condyle. 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 Chondroplasty be performed at the time of surgery understands that he may have progression and may require total knee arthroplasty at some point in the future. He has a displaced meniscal fragment is having repetitive catching in line with the meniscal fragment with mechanical symptoms. A long discussion about the arthroscopy with meniscal tear and with degenerative changes that are present. Patient understands request to proceed. Patient is a diabetic we'll schedule this as early a.m. surgery.  Follow-Up Instructions: No Follow-up on file.   Orders:  No orders of the defined types were placed in this encounter.  No orders of the defined types were placed in this encounter.     Procedures: No procedures performed   Clinical Data: No additional findings.   Subjective: Chief  Complaint  Patient presents with  . Right Knee - Pain    Michael Li is here today with righ tknee pain. He states he has had this for about 8 weeks. There was no actual injury, just woke up one morning with pain. He has actually been to [facility], but was unhappy with the care he was getting due to "lack of communication". He states he has had a cortisone injection with not much help. He has had a recent MRI. He states he is having trouble walking his dog, stairs, or standing for long periods of time.     Review of Systems  Constitutional: Negative for chills and diaphoresis.  HENT: Negative for ear discharge, ear pain and nosebleeds.   Eyes: Negative for discharge and visual disturbance.  Respiratory: Negative for cough, choking and shortness of breath.   Cardiovascular: Negative for chest pain and palpitations.  Gastrointestinal: Negative for abdominal distention and abdominal pain.       Positive for hepatitis C in the past treated with [place]   Endocrine: Negative for cold intolerance and heat intolerance.  Genitourinary: Negative for flank pain and hematuria.  Musculoskeletal:       Right knee pain and catching 3 months. Previous the lumbar surgery by Dr. 3 years ago doing well.  Skin: Negative for rash and wound.  Neurological: Negative for seizures and speech difficulty.  Hematological: Negative for adenopathy. Does not bruise/bleed easily.  Psychiatric/Behavioral: Negative for agitation and suicidal ideas.  Objective: Vital Signs: BP (!) 162/95 (BP Location: Right Arm, Patient Position: Sitting, Cuff Size: Normal)   Pulse 71   Temp 97.1 F (36.2 C)   Physical Exam  Constitutional: He is oriented to person, place, and time. He appears well-developed and well-nourished.  HENT:  Head: Normocephalic and atraumatic.  Eyes: EOM are normal. Pupils are equal, round, and reactive to light.  Neck: No tracheal deviation present. No thyromegaly present.    Cardiovascular: Normal rate.   Pulmonary/Chest: Effort normal. He has no wheezes.  Abdominal: Soft. Bowel sounds are normal.  Musculoskeletal:  Negative straight leg raising 90. Small popliteal Baker's cyst right knee. There is 2+ knee effusion. Exquisite medial joint line tenderness. Collateral cruciate ligament exam is normal. Crepitus with knee range of motion is full extension good flexion-extension quad strength. Distal pulses are 2+. Negative Lockman negative pivot shift. Positive Apley posterior medial joint line pain with hyperextension.  Neurological: He is alert and oriented to person, place, and time.  Skin: Skin is warm and dry. Capillary refill takes less than 2 seconds.  Psychiatric: He has a normal mood and affect. His behavior is normal. Judgment and thought content normal.    Ortho Exam  Specialty Comments:  No specialty comments available.  Imaging: No results found. Show images for MR KNEE RIGHT WO CONTRAST  Study Result   CLINICAL DATA:  Right knee pain.  EXAM: MRI OF THE RIGHT KNEE WITHOUT CONTRAST  TECHNIQUE: Multiplanar, multisequence MR imaging of the knee was performed. No intravenous contrast was administered.  COMPARISON:  None.  FINDINGS: MENISCI  Medial meniscus: Radial tear of the posterior horn- body junction of the medial meniscus.  Lateral meniscus:  Intact.  LIGAMENTS  Cruciates:  Intact ACL and PCL.  Collaterals: Medial collateral ligament is intact. Lateral collateral ligament complex is intact.  CARTILAGE  Patellofemoral: Focal small area of high-grade partial-thickness cartilage loss of the lateral patellar facet. Partial-thickness cartilage loss of the medial patellar facet.  Medial: Partial-thickness cartilage loss of the medial femorotibial compartment.  Lateral:  No chondral defect.  Joint: Moderate joint effusion. Normal Hoffa's fat. No plical thickening. Moderate amount of fluid in the deep  infrapatellar bursa.  Popliteal Fossa: Small Baker cyst with a small amount of fluid superficial to the media gastrocnemius muscle. Intact popliteus tendon.  Extensor Mechanism: Intact quadriceps tendon and patellar tendon. Intact medial and lateral patellar retinaculum. Intact MPFL.  Bones: Subcortical subchondral linear signal abnormality in the medial femoral condyle with severe surrounding marrow edema most consistent with a nondisplaced, nondepressed insufficiency fracture. Bipartite superolateral patella with subcortical reactive marrow changes on either side of the synchondrosis.  Other: No fluid collection or hematoma.  IMPRESSION: 1. Subchondral insufficiency fracture of the medial femoral condyle with severe surrounding marrow edema. 2. Radial tear of the posterior horn- body junction of the medial meniscus. 3. Cartilage abnormalities of the patellofemoral compartment and medial femorotibial compartment. 4. Moderate joint effusion. 5. Small leaking Baker cyst.   Electronically Signed   By: Kathreen Devoid   On: 01/27/2016 17:39      PMFS History: Patient Active Problem List   Diagnosis Date Noted  . Acute medial meniscal tear 02/05/2016  . Diabetes mellitus type II, uncontrolled ([place]) 08/28/2013  . HNP (herniated nucleus pulposus), lumbar 09/12/2012  . 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 HEPATITIS C, CHRONIC 04/03/2007  . Diabetes mellitus, type II ([place]) 07/19/2006  . ERECTILE DYSFUNCTION 07/19/2006  . ATTENTION DEFICIT HYPERACTIVITY DISORDER 07/19/2006  .  GERD 07/19/2006  . BENIGN PROSTATIC HYPERTROPHY 07/19/2006   Past Medical History:  Diagnosis Date  . Anemia    rec'd 32 units of blood, post T&A, 1981  . ATTENTION DEFICIT HYPERACTIVITY DISORDER 07/19/2006  . BENIGN PROSTATIC HYPERTROPHY 07/19/2006  . DIABETES MELLITUS, TYPE II 07/19/2006  . ERECTILE DYSFUNCTION 07/19/2006  . GERD 07/19/2006  . HEPATITIS C, CHRONIC 04/03/2007  .  NEOPLASM, MALIGNANT, CARCINOMA, BASAL CELL, NOSE 10/20/2009    Family History  Problem Relation Age of Onset  . Diabetes Maternal Aunt   . Cancer Paternal Grandmother     colon cancer  . Diabetes Maternal Aunt     Past Surgical History:  Procedure Laterality Date  . APPENDECTOMY    . carotid injury       post tonsillectomy, R carotid injury with trach  . HERNIA REPAIR  1973   inguinal- R side  . LUMBAR LAMINECTOMY/DECOMPRESSION MICRODISCECTOMY Right 09/12/2012   Procedure: LUMBAR LAMINECTOMY/DECOMPRESSION MICRODISCECTOMY 1 LEVEL;  Surgeon: Charlie Pitter, MD;  Location: [facility];  Service: Neurosurgery;  Laterality: Right;  Right lumbar three-four microdiscectomy  . TONSILLECTOMY    . [place]   Social History   Occupational History  . 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 Smoking status: Former Research scientist (life sciences)  . Smokeless tobacco: Former Systems developer    Quit date: 02/15/2007  . Alcohol use Not on file  . Drug use: Unknown  . Sexual activity: Not on file

## 2016-02-11 ENCOUNTER — Telehealth: Payer: Self-pay | Admitting: Endocrinology

## 2016-02-12 NOTE — Telephone Encounter (Signed)
Appt needed for refills on his Trulicity. Last refilled was 01/18/16 and stated must have an appt for further refills

## 2016-02-18 NOTE — Telephone Encounter (Signed)
Patient wants to talk to you about his fmla forms   Cb# [phone]

## 2016-02-19 NOTE — Telephone Encounter (Signed)
I called patient. Had to leave voicemail for return call.

## 2016-02-22 DIAGNOSIS — M23331 Other meniscus derangements, other medial meniscus, right knee: Secondary | ICD-10-CM | POA: Diagnosis not present

## 2016-03-01 ENCOUNTER — Ambulatory Visit (INDEPENDENT_AMBULATORY_CARE_PROVIDER_SITE_OTHER): Payer: Self-pay | Admitting: Orthopaedic Surgery

## 2016-03-01 VITALS — BP 141/76 | HR 71 | Ht 69.0 in | Wt 190.0 lb

## 2016-03-01 DIAGNOSIS — S838X1D Sprain of other specified parts of right knee, subsequent encounter: Secondary | ICD-10-CM

## 2016-03-01 NOTE — Progress Notes (Signed)
Office Visit Note   Patient: Michael Li           Date of Birth: 1955-06-17           MRN: [id number] Visit Date: 03/01/2016              Requested by: Marletta Lor, MD [place],  [id number] PCP: Nyoka Cowden, MD   Assessment & Plan: Visit Diagnoses:  1. Acute medial meniscal injury of right knee, subsequent encounter     Plan: Patient has mild swelling arthroscopic portals look good. I'll check him back again on appear basis and gradually increase his activities he can use some ibuprofen as needed. He is happy with the surgical result and we discussed the findings he has copies of his arthroscopic photos.  Follow-Up Instructions: Return if symptoms worsen or fail to improve.   Orders:  No orders of the defined types were placed in this encounter.  No orders of the defined types were placed in this encounter.     Procedures: No procedures performed   Clinical Data: No additional findings.   Subjective: Chief Complaint  Patient presents with  . Right Knee - Routine Post Op, Follow-up    Patient is post right knee arthroscopy 02/22/16. Patient states still having pain, stiffness.  Patient is doing home exercises and using cane when walking for extended periods of time.  Steri-strips removed and incisions look good.  New Steri-strips applied.    Review of Systems 14 point review of systems updated and unchanged from surgery last week.   Objective: Vital Signs: BP (!) 141/76   Pulse 71   Ht 5\' 9"  (1.753 m)   Wt 190 lb (86.2 kg)   BMI 28.06 kg/m   Physical Exam arthroscopic portals are healed Steri-Strips applied. He has only mild swelling he has some mild discomfort abuse on his feet a lot. He is a mature without limp.  Ortho Exam  Specialty Comments:  No specialty comments available.  Imaging: No results found.   PMFS History: Patient Active Problem List   Diagnosis Date Noted  . Acute medial meniscal  tear 02/05/2016  . Diabetes mellitus type II, uncontrolled ([place]) 08/28/2013  . HNP (herniated nucleus pulposus), lumbar 09/12/2012  . 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 HEPATITIS C, CHRONIC 04/03/2007  . Diabetes mellitus, type II ([place]) 07/19/2006  . ERECTILE DYSFUNCTION 07/19/2006  . ATTENTION DEFICIT HYPERACTIVITY DISORDER 07/19/2006  . GERD 07/19/2006  . BENIGN PROSTATIC HYPERTROPHY 07/19/2006   Past Medical History:  Diagnosis Date  . Anemia    rec'd 32 units of blood, post T&A, 1981  . ATTENTION DEFICIT HYPERACTIVITY DISORDER 07/19/2006  . BENIGN PROSTATIC HYPERTROPHY 07/19/2006  . DIABETES MELLITUS, TYPE II 07/19/2006  . ERECTILE DYSFUNCTION 07/19/2006  . GERD 07/19/2006  . HEPATITIS C, CHRONIC 04/03/2007  . NEOPLASM, MALIGNANT, CARCINOMA, BASAL CELL, NOSE 10/20/2009    Family History  Problem Relation Age of Onset  . Diabetes Maternal Aunt   . Cancer Paternal Grandmother     colon cancer  . Diabetes Maternal Aunt     Past Surgical History:  Procedure Laterality Date  . APPENDECTOMY    . carotid injury       post tonsillectomy, R carotid injury with trach  . HERNIA REPAIR  1973   inguinal- R side  . LUMBAR LAMINECTOMY/DECOMPRESSION MICRODISCECTOMY Right 09/12/2012   Procedure: LUMBAR LAMINECTOMY/DECOMPRESSION MICRODISCECTOMY 1 LEVEL;  Surgeon: Charlie Pitter, MD;  Location: [facility];  Service: Neurosurgery;  Laterality: Right;  Right lumbar three-four microdiscectomy  . TONSILLECTOMY    . [place]   Social History   Occupational History  . 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 Smoking status: Former Research scientist (life sciences)  . Smokeless tobacco: Former Systems developer    Quit date: 02/15/2007  . Alcohol use Not on file  . Drug use: Unknown  . Sexual activity: Not on file

## 2016-03-03 ENCOUNTER — Other Ambulatory Visit: Payer: Self-pay | Admitting: Endocrinology

## 2016-03-09 ENCOUNTER — Encounter: Payer: Self-pay | Admitting: Endocrinology

## 2016-03-09 ENCOUNTER — Ambulatory Visit (INDEPENDENT_AMBULATORY_CARE_PROVIDER_SITE_OTHER): Payer: Managed Care, Other (non HMO) | Admitting: Endocrinology

## 2016-03-09 VITALS — BP 152/90 | HR 106 | Ht 69.0 in | Wt 196.0 lb

## 2016-03-09 DIAGNOSIS — Z794 Long term (current) use of insulin: Secondary | ICD-10-CM

## 2016-03-09 DIAGNOSIS — R030 Elevated blood-pressure reading, without diagnosis of hypertension: Secondary | ICD-10-CM | POA: Diagnosis not present

## 2016-03-09 DIAGNOSIS — E1165 Type 2 diabetes mellitus with hyperglycemia: Secondary | ICD-10-CM

## 2016-03-09 NOTE — Progress Notes (Signed)
Patient ID: Michael Li, male   DOB: 18-Feb-1955, 61 y.o.   MRN: [id number]               Reason for Appointment: Followup for Type 2 Diabetes  Referring physician: Burnice Logan  History of Present Illness:          Diagnosis: Type 2 diabetes mellitus, date of diagnosis: 2003        Past history:  Initial glucose was about 485 and he had been symptomatic with weakness and fatigue He was initially treated with metformin and glimepiride with initially reasonably good control At one point his A1c was down to 7.3% However about 2 years later his blood sugars were getting out of control and he was started on insulin He thinks he was given mealtime insulin first and then added NPH at bedtime which he has continued Victoza was added about 3 years later and he thinks glucose readings were some better with this  He was started on Invokana in addition to metformin and Victoza in 7/15.    Recent history:   INSULIN regimen is described as: NPH 32 hs 11 pm, Humalog 15 breakfast; 15-16  lunch- 18  supper   Non-insulin hypoglycemic drugs the patient is taking are: Metformin 1 g twice a day, Invokana 123XX123 mg daily, Trulicity 1.5 mg weekly     He is taking bedtime NPH insulin along with mealtime NovoLog  A1c was checked in November and was relatively higher at 6.2 However he has not been seen in follow-up for almost a year now His insurance did not cover those and he started taking Trulicity last June   Current blood sugar patterns and problems identified:  His blood sugars are being monitored very sporadically and has only 7 readings in the last month  The fasting blood sugars are mostly near normal with higher reading of 165 this morning  He has had an episode of low blood sugars at 3 AM in the last month but this is unusual  He does not adjust his  insulin doses based on his meal intake and not clear why he had a reading of 309 a few days ago after supper  He tends to forget to check  blood sugars especially after meals  Cannot assess blood sugar patterns with only sporadic readings  He has not been able to exercise because of a torn meniscus  About a month ago he stopped taking metformin because of some meals media reports about bad side effects from this  Also occasionally may forget to take his Trulicity, he tries to take it on Mondays and did not take it last week        Side effects from medications have been: None   Glucose monitoring:  ?  2 times a day        Glucometer:  Verio     Blood Glucose readings from meter review as above   Glycemic control:     Lab Results  Component Value Date   HGBA1C 6.2 12/24/2015   HGBA1C 5.8 03/10/2015   HGBA1C 5.7 11/27/2014   Lab Results  Component Value Date   MICROALBUR 1.7 12/24/2015   LDLCALC 84 12/24/2015   CREATININE 1.39 12/24/2015    Self-care: The diet that the patient has been following is: None     Meals: 3 meals per day.  Egg or a toast at breakfast, at lunch he will have a sandwich or salad or burger; dinner will have meat and  starch. Eating out at least once a day with restaurants like [place], [place] or [place]  Exercise:  he is not walking recently      Dietician visit: Most recent: Never.               Weight history: Maximum upto 210 in last few years  Wt Readings from Last 3 Encounters:  03/09/16 196 lb (88.9 kg)  03/01/16 190 lb (86.2 kg)  12/24/15 194 lb (88 kg)    Allergies as of 03/09/2016      Reactions   Cephalexin Swelling   In throat      Medication List       Accurate as of 03/09/16  2:37 PM. Always use your most recent med list.          amphetamine-dextroamphetamine 30 MG 24 hr capsule Commonly known as:  ADDERALL XR Take 1 capsule (30 mg total) by mouth every morning.   amphetamine-dextroamphetamine 30 MG 24 hr capsule Commonly known as:  ADDERALL XR Take 1 capsule (30 mg total) by mouth daily.   amphetamine-dextroamphetamine 30 MG 24 hr capsule Commonly  known as:  ADDERALL XR Take 1 capsule (30 mg total) by mouth daily.   amphetamine-dextroamphetamine 20 MG tablet Commonly known as:  ADDERALL Take 1 tablet (20 mg total) by mouth 3 (three) times daily.   amphetamine-dextroamphetamine 20 MG tablet Commonly known as:  ADDERALL Take 1 tablet (20 mg total) by mouth 3 (three) times daily.   amphetamine-dextroamphetamine 20 MG tablet Commonly known as:  ADDERALL Take 1 tablet (20 mg total) by mouth 3 (three) times daily.   aspirin 81 MG tablet Take 81 mg by mouth daily.   diclofenac sodium 1 % Gel Commonly known as:  VOLTAREN Apply 4 g topically 4 (four) times daily.   glucose blood test strip Commonly known as:  ONETOUCH VERIO Use as instructed to check blood sugar 3 times per day dx code E11.65   Insulin Lispro 200 UNIT/ML Sopn Commonly known as:  HUMALOG KWIKPEN Inject 64 Units into the skin 3 (three) times daily. Inject 22 units at breakfast and lunch and 20 units before dinner   Insulin NPH (Human) (Isophane) 100 UNIT/ML Kiwkpen Commonly known as:  HUMULIN N Inject 42 Units into the skin at bedtime.   Insulin Pen Needle 32G X 4 MM Misc Commonly known as:  BD PEN NEEDLE NANO U/F USE 5 PER DAY TO INJECT INSULIN AND VICTOZA   INSULIN SYRINGE .5CC/31GX5/16" 31G X 5/16" 0.5 ML Misc Inject 1 each as directed daily.   INVOKANA 100 MG Tabs tablet Generic drug:  canagliflozin TAKE 1 TABLET BY MOUTH DAILY BEFORE BREAKFAST   liraglutide 18 MG/3ML Sopn Inject 0.3 mLs (1.8 mg total) into the skin daily before breakfast.   metFORMIN 1000 MG tablet Commonly known as:  GLUCOPHAGE TAKE 1 TABLET BY MOUTH TWICE DAILY WITH A MEAL   multivitamin with minerals Tabs tablet Take 1 tablet by mouth daily.   omeprazole 20 MG capsule Commonly known as:  PRILOSEC Take 1 capsule (20 mg total) by mouth daily.   ONETOUCH DELICA LANCETS FINE Misc USE TO CHECK BLOOD SUGAR THREE TIMES A DAY   oxyCODONE-acetaminophen 5-325 MG  tablet Commonly known as:  PERCOCET/ROXICET   tadalafil 20 MG tablet Commonly known as:  CIALIS Take 1 tablet (20 mg total) by mouth daily as needed for erectile dysfunction.   TRULICITY 1.5 0000000 Sopn Generic drug:  Dulaglutide INJECT 1.5 MG UNDER THE SKIN ONCE A WEEK  Allergies:  Allergies  Allergen Reactions  . Cephalexin Swelling    In throat    Past Medical History:  Diagnosis Date  . Anemia    rec'd 32 units of blood, post T&A, 1981  . ATTENTION DEFICIT HYPERACTIVITY DISORDER 07/19/2006  . BENIGN PROSTATIC HYPERTROPHY 07/19/2006  . DIABETES MELLITUS, TYPE II 07/19/2006  . ERECTILE DYSFUNCTION 07/19/2006  . GERD 07/19/2006  . HEPATITIS C, CHRONIC 04/03/2007  . NEOPLASM, MALIGNANT, CARCINOMA, BASAL CELL, NOSE 10/20/2009    Past Surgical History:  Procedure Laterality Date  . APPENDECTOMY    . carotid injury       post tonsillectomy, R carotid injury with trach  . HERNIA REPAIR  1973   inguinal- R side  . LUMBAR LAMINECTOMY/DECOMPRESSION MICRODISCECTOMY Right 09/12/2012   Procedure: LUMBAR LAMINECTOMY/DECOMPRESSION MICRODISCECTOMY 1 LEVEL;  Surgeon: Charlie Pitter, MD;  Location: [facility];  Service: Neurosurgery;  Laterality: Right;  Right lumbar three-four microdiscectomy  . TONSILLECTOMY    . TRACHEOSTOMY CLOSURE  1981    Family History  Problem Relation Age of Onset  . Diabetes Maternal Aunt   . Cancer Paternal Grandmother     colon cancer  . Diabetes Maternal Aunt     Social History:  reports that he has quit smoking. He quit smokeless tobacco use about 9 years ago. His alcohol and drug histories are not on file. No alcohol   Review of Systems       Lipids: Within normal range Not taking any statin drugs.  Last LDL below 100 Has been followed by PCP also       Lab Results  Component Value Date   CHOL 140 12/24/2015   HDL 42.30 12/24/2015   LDLCALC 84 12/24/2015   TRIG 69.0 12/24/2015   CHOLHDL 3 12/24/2015         Has history of erectile  dysfunction and has been prescribed Cialis     Taking Adderall for ADHD  Eye exam To be done next month  ?  Hypertension: Blood pressure is relatively high today, has also been having knee pain recently    LABS:  No visits with results within 1 Week(s) from this visit.  Latest known visit with results is:  Erroneous Encounter on 12/24/2015  Component Date Value Ref Range Status  . Hgb A1c MFr Bld 12/24/2015 6.2  4.6 - 6.5 % Final  . Sodium 12/24/2015 138  135 - 145 mEq/L Final  . Potassium 12/24/2015 4.0  3.5 - 5.1 mEq/L Final  . Chloride 12/24/2015 105  96 - 112 mEq/L Final  . CO2 12/24/2015 26  19 - 32 mEq/L Final  . Glucose, Bld 12/24/2015 143* 70 - 99 mg/dL Final  . BUN 12/24/2015 25* 6 - 23 mg/dL Final  . Creatinine, Ser 12/24/2015 1.39  0.40 - 1.50 mg/dL Final  . Total Bilirubin 12/24/2015 0.7  0.2 - 1.2 mg/dL Final  . Alkaline Phosphatase 12/24/2015 62  39 - 117 U/L Final  . AST 12/24/2015 21  0 - 37 U/L Final  . ALT 12/24/2015 26  0 - 53 U/L Final  . Total Protein 12/24/2015 6.9  6.0 - 8.3 g/dL Final  . Albumin 12/24/2015 4.3  3.5 - 5.2 g/dL Final  . Calcium 12/24/2015 9.8  8.4 - 10.5 mg/dL Final  . GFR 12/24/2015 55.27* >60.00 mL/min Final  . Microalb, Ur 12/24/2015 1.7  0.0 - 1.9 mg/dL Final  . Creatinine,U 12/24/2015 90.5  mg/dL Final  . Microalb Creat Ratio 12/24/2015 1.9  0.0 - 30.0 mg/g Final  . Color, Urine 12/24/2015 YELLOW  Yellow;Lt. Yellow Final  . APPearance 12/24/2015 CLEAR  Clear Final  . Specific Gravity, Urine 12/24/2015 1.015  1.000 - 1.030 Final  . pH 12/24/2015 5.5  5.0 - 8.0 Final  . Total Protein, Urine 12/24/2015 NEGATIVE  Negative Final  . Urine Glucose 12/24/2015 >=1000* Negative Final  . Ketones, ur 12/24/2015 NEGATIVE  Negative Final  . Bilirubin Urine 12/24/2015 NEGATIVE  Negative Final  . Hgb urine dipstick 12/24/2015 NEGATIVE  Negative Final  . Urobilinogen, UA 12/24/2015 0.2  0.0 - 1.0 Final  . Leukocytes, UA 12/24/2015 NEGATIVE   Negative Final  . Nitrite 12/24/2015 NEGATIVE  Negative Final  . WBC, UA 12/24/2015 none seen  0-2/hpf Final  . RBC / HPF 12/24/2015 none seen  0-2/hpf Final  . Squamous Epithelial / LPF 12/24/2015 Rare(0-4/hpf)  Rare(0-4/hpf) Final  . Cholesterol 12/24/2015 140  0 - 200 mg/dL Final  . Triglycerides 12/24/2015 69.0  0.0 - 149.0 mg/dL Final  . HDL 12/24/2015 42.30  >39.00 mg/dL Final  . VLDL 12/24/2015 13.8  0.0 - 40.0 mg/dL Final  . LDL Cholesterol 12/24/2015 84  0 - 99 mg/dL Final  . Total CHOL/HDL Ratio 12/24/2015 3   Final  . NonHDL 12/24/2015 97.38   Final    Physical Examination:  BP (!) 152/90   Pulse (!) 106   Ht 5\' 9"  (1.753 m)   Wt 196 lb (88.9 kg)   SpO2 93%   BMI 28.94 kg/m     ASSESSMENT/PLAN  Diabetes type 2, uncontrolled     See history of present illness for details of current management, blood sugar patterns and problems identified  Patient is on a multidrug regimen including bedtime NPH, mealtime insulin and Trulicity now  He has been seen after a one-year interval A1c last still fairly good even though he tends to have relatively high readings than expected Difficult to identify blood sugar patterns because of lack of adequate monitoring at home, he is forgetting to do this Last A1c was 6.2, about 2 months ago  He has been inconsistent with monitoring his blood sugar and has only a few fasting readings Also cannot explain sporadic high readings that he has Has difficulty remembering to take Trulicity every week Currently not taking metformin as he was reading about side effects on his own  Recommendations:  No change in insulin as yet, however may possibly reduce  NPH at bedtime if getting more hypoglycemia overnight  Restart metformin twice a day, he wants to use up the supply at home before changing to once a day  Needs to check A1c in 1 month  He will use the Trulicity application on his smart phone to help him remind him to take it every  week  He will potentially benefit from using the new GLP-1 drug when available on his insurance  Restart walking been able to  More consistent monitoring of blood sugars especially at night, he can do this when he takes his bedtime injection  Continue Invokana  Need to follow-up on his blood pressure with PCP  Will continue to monitor renal function, both borderline on the last evaluation Counseling time on subjects discussed above is over 50% of today's 25 minute visit  Patient Instructions  Check blood sugars on waking up  2-3x per week  Also check blood sugars about 2 hours after a meal and do this after different meals by rotation  Recommended blood sugar levels  on waking up is 90-130 and about 2 hours after meal is 130-160  Please bring your blood sugar monitor to each visit, thank you  More sugars at nite  Trulicity APP on phone  Restart Metformin  Check on [place] 03/09/2016, 2:37 PM   Note: This office note was prepared with Dragon voice recognition system technology. Any transcriptional errors that result from this process are unintentional.

## 2016-03-09 NOTE — Patient Instructions (Signed)
Check blood sugars on waking up  2-3x per week  Also check blood sugars about 2 hours after a meal and do this after different meals by rotation  Recommended blood sugar levels on waking up is 90-130 and about 2 hours after meal is 130-160  Please bring your blood sugar monitor to each visit, thank you  More sugars at nite  Trulicity APP on phone  Restart Metformin  Check on [place]

## 2016-03-10 ENCOUNTER — Other Ambulatory Visit: Payer: Managed Care, Other (non HMO)

## 2016-03-13 ENCOUNTER — Other Ambulatory Visit: Payer: Self-pay | Admitting: Endocrinology

## 2016-03-14 ENCOUNTER — Other Ambulatory Visit (INDEPENDENT_AMBULATORY_CARE_PROVIDER_SITE_OTHER): Payer: Managed Care, Other (non HMO)

## 2016-03-14 DIAGNOSIS — Z0000 Encounter for general adult medical examination without abnormal findings: Secondary | ICD-10-CM

## 2016-03-14 LAB — LIPID PANEL
Cholesterol: 161 mg/dL (ref 0–200)
HDL: 42.7 mg/dL (ref >39.00)
LDL Cholesterol: 96 mg/dL (ref 0–99)
NonHDL: 118.37
Total CHOL/HDL Ratio: 4
Triglycerides: 111 mg/dL (ref 0.0–149.0)
VLDL: 22.2 mg/dL (ref 0.0–40.0)

## 2016-03-14 LAB — CBC WITH DIFFERENTIAL/PLATELET
Basophils Absolute: 0 10*3/uL (ref 0.0–0.1)
Basophils Relative: 0.1 % (ref 0.0–3.0)
Eosinophils Absolute: 0.1 10*3/uL (ref 0.0–0.7)
Eosinophils Relative: 2.1 % (ref 0.0–5.0)
HCT: 45.7 % (ref 39.0–52.0)
Hemoglobin: 16 g/dL (ref 13.0–17.0)
Lymphocytes Relative: 26.5 % (ref 12.0–46.0)
Lymphs Abs: 1.5 10*3/uL (ref 0.7–4.0)
MCHC: 34.9 g/dL (ref 30.0–36.0)
MCV: 88.3 fl (ref 78.0–100.0)
Monocytes Absolute: 0.6 10*3/uL (ref 0.1–1.0)
Monocytes Relative: 10.6 % (ref 3.0–12.0)
Neutro Abs: 3.5 10*3/uL (ref 1.4–7.7)
Neutrophils Relative %: 60.7 % (ref 43.0–77.0)
Platelets: 164 10*3/uL (ref 150.0–400.0)
RBC: 5.18 Mil/uL (ref 4.22–5.81)
RDW: 12.7 % (ref 11.5–15.5)
WBC: 5.8 10*3/uL (ref 4.0–10.5)

## 2016-03-14 LAB — HEPATIC FUNCTION PANEL
ALT: 25 U/L (ref 0–53)
AST: 24 U/L (ref 0–37)
Albumin: 4.3 g/dL (ref 3.5–5.2)
Alkaline Phosphatase: 62 U/L (ref 39–117)
Bilirubin, Direct: 0.2 mg/dL (ref 0.0–0.3)
Total Bilirubin: 0.7 mg/dL (ref 0.2–1.2)
Total Protein: 6.4 g/dL (ref 6.0–8.3)

## 2016-03-14 LAB — POC URINALSYSI DIPSTICK (AUTOMATED)
Bilirubin, UA: NEGATIVE
Blood, UA: NEGATIVE
Ketones, UA: NEGATIVE
Leukocytes, UA: NEGATIVE
Nitrite, UA: NEGATIVE
Protein, UA: NEGATIVE
Spec Grav, UA: 1.025
Urobilinogen, UA: 0.2
pH, UA: 5.5

## 2016-03-14 LAB — HEMOGLOBIN A1C: Hgb A1c MFr Bld: 6.3 % (ref 4.6–6.5)

## 2016-03-14 LAB — BASIC METABOLIC PANEL
BUN: 21 mg/dL (ref 6–23)
CO2: 26 mEq/L (ref 19–32)
Calcium: 9.2 mg/dL (ref 8.4–10.5)
Chloride: 105 mEq/L (ref 96–112)
Creatinine, Ser: 0.91 mg/dL (ref 0.40–1.50)
GFR: 90.05 mL/min (ref >60.00)
Glucose, Bld: 105 mg/dL — ABNORMAL HIGH (ref 70–99)
Potassium: 4.2 mEq/L (ref 3.5–5.1)
Sodium: 139 mEq/L (ref 135–145)

## 2016-03-14 LAB — MICROALBUMIN / CREATININE URINE RATIO
Creatinine,U: 139.7 mg/dL
Microalb Creat Ratio: 3.1 mg/g (ref 0.0–30.0)
Microalb, Ur: 4.3 mg/dL — ABNORMAL HIGH (ref 0.0–1.9)

## 2016-03-14 LAB — TSH: TSH: 1.97 u[IU]/mL (ref 0.35–4.50)

## 2016-03-14 LAB — PSA: PSA: 0.89 ng/mL (ref 0.10–4.00)

## 2016-03-15 ENCOUNTER — Ambulatory Visit (INDEPENDENT_AMBULATORY_CARE_PROVIDER_SITE_OTHER): Payer: Self-pay | Admitting: Orthopaedic Surgery

## 2016-03-15 ENCOUNTER — Encounter (INDEPENDENT_AMBULATORY_CARE_PROVIDER_SITE_OTHER): Payer: Self-pay | Admitting: Orthopaedic Surgery

## 2016-03-15 VITALS — BP 140/90 | HR 95 | Ht 69.0 in | Wt 190.0 lb

## 2016-03-15 DIAGNOSIS — S83241D Other tear of medial meniscus, current injury, right knee, subsequent encounter: Secondary | ICD-10-CM

## 2016-03-15 NOTE — Progress Notes (Signed)
   Post-Op Visit Note   Patient: Michael Li           Date of Birth: 1955/05/02           MRN: [id number] Visit Date: 03/15/2016 PCP: Nyoka Cowden, MD   Assessment & Plan:  Chief Complaint:  Chief Complaint  Patient presents with  . Right Knee - Routine Post Op   Visit Diagnoses:  1. Acute medial meniscus tear of right knee, subsequent encounter   Post partial medial meniscectomy 02/22/2016. Mild postoperative swelling.  Plan: Patient noted some mild swelling at the end dural lateral suprapatellar portal spot he did not have any sutures to Steri-Strips. He can take some Aleve for the swelling use a little bit elevation and ice intermittently is not really having pain no locking or catching. If he is very active during the days had a little bit of mild limping and a little bit of medial joint line tenderness.  Follow-Up Instructions: Return if symptoms worsen or fail to improve.   Orders:  No orders of the defined types were placed in this encounter.  No orders of the defined types were placed in this encounter.  HPI Patient returns today with some swelling over his lateral portal. He is status post right knee arthroscopy 02/22/2016. He is still having some pain with extension and continues to limp some. He also has a pocket or some swelling behind the knee.    Imaging: No results found.  PMFS History: Patient Active Problem List   Diagnosis Date Noted  . Acute medial meniscal tear 02/05/2016  . Diabetes mellitus type II, uncontrolled ([place]) 08/28/2013  . HNP (herniated nucleus pulposus), lumbar 09/12/2012  . 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 HEPATITIS C, CHRONIC 04/03/2007  . Diabetes mellitus, type II ([place]) 07/19/2006  . ERECTILE DYSFUNCTION 07/19/2006  . ATTENTION DEFICIT HYPERACTIVITY DISORDER 07/19/2006  . GERD 07/19/2006  . BENIGN PROSTATIC HYPERTROPHY 07/19/2006   Past Medical History:  Diagnosis Date  . Anemia    rec'd 32  units of blood, post T&A, 1981  . ATTENTION DEFICIT HYPERACTIVITY DISORDER 07/19/2006  . BENIGN PROSTATIC HYPERTROPHY 07/19/2006  . DIABETES MELLITUS, TYPE II 07/19/2006  . ERECTILE DYSFUNCTION 07/19/2006  . GERD 07/19/2006  . HEPATITIS C, CHRONIC 04/03/2007  . NEOPLASM, MALIGNANT, CARCINOMA, BASAL CELL, NOSE 10/20/2009    Family History  Problem Relation Age of Onset  . Diabetes Maternal Aunt   . Cancer Paternal Grandmother     colon cancer  . Diabetes Maternal Aunt     Past Surgical History:  Procedure Laterality Date  . APPENDECTOMY    . carotid injury       post tonsillectomy, R carotid injury with trach  . HERNIA REPAIR  1973   inguinal- R side  . LUMBAR LAMINECTOMY/DECOMPRESSION MICRODISCECTOMY Right 09/12/2012   Procedure: LUMBAR LAMINECTOMY/DECOMPRESSION MICRODISCECTOMY 1 LEVEL;  Surgeon: Charlie Pitter, MD;  Location: [facility];  Service: Neurosurgery;  Laterality: Right;  Right lumbar three-four microdiscectomy  . TONSILLECTOMY    . [place]   Social History   Occupational History  . 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 Smoking status: Former Research scientist (life sciences)  . Smokeless tobacco: Former Systems developer    Quit date: 02/15/2007  . Alcohol use Not on file  . Drug use: Unknown  . Sexual activity: Not on file

## 2016-03-17 ENCOUNTER — Ambulatory Visit (INDEPENDENT_AMBULATORY_CARE_PROVIDER_SITE_OTHER): Payer: Managed Care, Other (non HMO) | Admitting: Internal Medicine

## 2016-03-17 ENCOUNTER — Encounter: Payer: Self-pay | Admitting: Internal Medicine

## 2016-03-17 VITALS — BP 110/70 | HR 88 | Temp 97.6°F | Ht 67.0 in | Wt 199.0 lb

## 2016-03-17 DIAGNOSIS — Z0000 Encounter for general adult medical examination without abnormal findings: Secondary | ICD-10-CM

## 2016-03-17 NOTE — Patient Instructions (Addendum)

## 2016-03-17 NOTE — Progress Notes (Signed)
Subjective:    Patient ID: Michael Li, male    DOB: 31-Jan-1956, 61 y.o.   MRN: [id number]  HPI  61 year old patient who is seen today for a preventive health examination.  He is followed closely by endocrinology for diabetes which has been not quite stable He has a history of treated hepatitis C No major concerns or complaints.  He has had recent arthroscopic knee surgery.  Did have follow-up colonoscopy about one year ago Anuli exam is scheduled for next week  Lab Results  Component Value Date   HGBA1C 6.3 03/14/2016   Past Medical History:  Diagnosis Date  . Anemia    rec'd 32 units of blood, post T&A, 1981  . ATTENTION DEFICIT HYPERACTIVITY DISORDER 07/19/2006  . BENIGN PROSTATIC HYPERTROPHY 07/19/2006  . DIABETES MELLITUS, TYPE II 07/19/2006  . ERECTILE DYSFUNCTION 07/19/2006  . GERD 07/19/2006  . HEPATITIS C, CHRONIC 04/03/2007  . NEOPLASM, MALIGNANT, CARCINOMA, BASAL CELL, NOSE 10/20/2009     Social History   Social History  . Marital status: Single    Spouse name: N/A  . Number of children: 2  . 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 Smoking status: Former Research scientist (life sciences)  . Smokeless tobacco: Former Systems developer    Quit date: 02/15/2007  . Alcohol use Not on file  . Drug use: No  . Sexual activity: Not on file   Other Topics Concern  . Not on file   Social History Narrative   Regular exercise-yes    Past Surgical History:  Procedure Laterality Date  . APPENDECTOMY    . carotid injury       post tonsillectomy, R carotid injury with trach  . HERNIA REPAIR  1973   inguinal- R side  . LUMBAR LAMINECTOMY/DECOMPRESSION MICRODISCECTOMY Right 09/12/2012   Procedure: LUMBAR LAMINECTOMY/DECOMPRESSION MICRODISCECTOMY 1 LEVEL;  Surgeon: Charlie Pitter, MD;  Location: [facility];  Service: Neurosurgery;  Laterality: Right;  Right lumbar three-four microdiscectomy  . TONSILLECTOMY    . TRACHEOSTOMY CLOSURE   1981    Family History  Problem Relation Age of Onset  . Diabetes Maternal Aunt   . Cancer Paternal Grandmother     colon cancer  . Diabetes Maternal Aunt     Allergies  Allergen Reactions  . Cephalexin Swelling    In throat    Current Outpatient Prescriptions on File Prior to Visit  Medication Sig Dispense Refill  . amphetamine-dextroamphetamine (ADDERALL XR) 30 MG 24 hr capsule Take 1 capsule (30 mg total) by mouth every morning. 30 capsule 0  . amphetamine-dextroamphetamine (ADDERALL XR) 30 MG 24 hr capsule Take 1 capsule (30 mg total) by mouth daily. 30 capsule 0  . amphetamine-dextroamphetamine (ADDERALL XR) 30 MG 24 hr capsule Take 1 capsule (30 mg total) by mouth daily. 30 capsule 0  . amphetamine-dextroamphetamine (ADDERALL) 20 MG tablet Take 1 tablet (20 mg total) by mouth 3 (three) times daily. 90 tablet 0  . amphetamine-dextroamphetamine (ADDERALL) 20 MG tablet Take 1 tablet (20 mg total) by mouth 3 (three) times daily. 90 tablet 0  . amphetamine-dextroamphetamine (ADDERALL) 20 MG tablet Take 1 tablet (20 mg total) by mouth 3 (three) times daily. 90 tablet 0  . aspirin 81 MG tablet Take 81 mg by mouth daily.      . diclofenac sodium (VOLTAREN) 1 % GEL Apply 4 g topically 4 (four) times daily. 5 Tube 0  . glucose  blood (ONETOUCH VERIO) test strip Use as instructed to check blood sugar 3 times per day dx code E11.65 300 each 1  . Insulin Lispro, Human, (HUMALOG KWIKPEN) 200 UNIT/ML SOPN Inject 64 Units into the skin 3 (three) times daily. Inject 22 units at breakfast and lunch and 20 units before dinner (Patient taking differently: Inject into the skin 3 (three) times daily. Inject 15 units at breakfast and lunch and 18 units before dinner) 60 mL 1  . Insulin NPH, Human,, Isophane, (HUMULIN N) 100 UNIT/ML Kiwkpen Inject 42 Units into the skin at bedtime. (Patient taking differently: Inject 30 Units into the skin at bedtime. ) 45 mL 0  . Insulin Pen Needle (BD PEN NEEDLE NANO  U/F) 32G X 4 MM MISC USE 5 PER DAY TO INJECT INSULIN AND VICTOZA 450 each 3  . Insulin Syringe-Needle U-100 (INSULIN SYRINGE .5CC/31GX5/16") 31G X 5/16" 0.5 ML MISC Inject 1 each as directed daily. 100 each 3  . INVOKANA 100 MG TABS tablet TAKE 1 TABLET BY MOUTH DAILY BEFORE BREAKFAST 30 tablet 0  . metFORMIN (GLUCOPHAGE) 1000 MG tablet TAKE 1 TABLET BY MOUTH TWICE DAILY WITH A MEAL 180 tablet 3  . Multiple Vitamin (MULTIVITAMIN WITH MINERALS) TABS Take 1 tablet by mouth daily.    Marland Kitchen omeprazole (PRILOSEC) 20 MG capsule Take 1 capsule (20 mg total) by mouth daily. 90 capsule 3  . ONETOUCH DELICA LANCETS FINE MISC USE TO CHECK BLOOD SUGAR THREE TIMES A DAY 300 each 0  . oxyCODONE-acetaminophen (PERCOCET/ROXICET) 5-325 MG tablet     . tadalafil (CIALIS) 20 MG tablet Take 1 tablet (20 mg total) by mouth daily as needed for erectile dysfunction. 10 tablet 1  . TRULICITY 1.5 0000000 SOPN INJECT 1.5 MG UNDER THE SKIN ONCE A WEEK 2 mL 0  . [DISCONTINUED] insulin aspart (NOVOLOG FLEXPEN) 100 unit/mL SOLN FlexPen Inject subcutaneously (just before each meal) 22-22-20 units 25 pen 3   No current facility-administered medications on file prior to visit.     BP 110/70   Pulse 88   Temp 97.6 F (36.4 C) (Oral)   Ht 5\' 7"  (1.702 m)   Wt 199 lb (90.3 kg)   BMI 31.17 kg/m     Review of Systems  Constitutional: Negative for appetite change, chills, fatigue and fever.  HENT: Negative for congestion, dental problem, ear pain, hearing loss, sore throat, tinnitus, trouble swallowing and voice change.   Eyes: Negative for pain, discharge and visual disturbance.  Respiratory: Negative for cough, chest tightness, wheezing and stridor.   Cardiovascular: Negative for chest pain, palpitations and leg swelling.  Gastrointestinal: Negative for abdominal distention, abdominal pain, blood in stool, constipation, diarrhea, nausea and vomiting.  Genitourinary: Negative for difficulty urinating, discharge, flank pain,  genital sores, hematuria and urgency.  Musculoskeletal: Negative for arthralgias, back pain, gait problem, joint swelling, myalgias and neck stiffness.  Skin: Negative for rash.  Neurological: Negative for dizziness, syncope, speech difficulty, weakness, numbness and headaches.  Hematological: Negative for adenopathy. Does not bruise/bleed easily.  Psychiatric/Behavioral: Negative for behavioral problems and dysphoric mood. The patient is not nervous/anxious.        Objective:   Physical Exam  Constitutional: He appears well-developed and well-nourished.  HENT:  Head: Normocephalic and atraumatic.  Right Ear: External ear normal.  Left Ear: External ear normal.  Nose: Nose normal.  Mouth/Throat: Oropharynx is clear and moist.  Eyes: Conjunctivae and EOM are normal. Pupils are equal, round, and reactive to light. No scleral icterus.  Neck: Normal range of motion. Neck supple. No JVD present. No thyromegaly present.  Cardiovascular: Regular rhythm, normal heart sounds and intact distal pulses.  Exam reveals no gallop and no friction rub.   No murmur heard. Pulmonary/Chest: Effort normal and breath sounds normal. He exhibits no tenderness.  Abdominal: Soft. Bowel sounds are normal. He exhibits no distension and no mass. There is no tenderness.  Genitourinary: Penis normal. Rectal exam shows guaiac negative stool.  Genitourinary Comments: Prostate plus 2 and symmetrical  Musculoskeletal: Normal range of motion. He exhibits no edema or tenderness.  Lymphadenopathy:    He has no cervical adenopathy.  Neurological: He is alert. He has normal reflexes. No cranial nerve deficit. Coordination normal.  Skin: Skin is warm and dry. No rash noted.  Psychiatric: He has a normal mood and affect. His behavior is normal.          Assessment & Plan:   Preventive health examination Diabetes mellitus.  Nicely controlled History chronic hepatitis C  Follow-up endocrinology Annual eye exam is  scheduled  Return here in one year or as needed  Nyoka Cowden

## 2016-04-09 ENCOUNTER — Other Ambulatory Visit: Payer: Self-pay | Admitting: Endocrinology

## 2016-04-19 ENCOUNTER — Other Ambulatory Visit: Payer: Self-pay | Admitting: Endocrinology

## 2016-05-02 ENCOUNTER — Ambulatory Visit (INDEPENDENT_AMBULATORY_CARE_PROVIDER_SITE_OTHER): Payer: 59 | Admitting: Family Medicine

## 2016-05-02 ENCOUNTER — Encounter: Payer: Self-pay | Admitting: Family Medicine

## 2016-05-02 VITALS — BP 132/82 | HR 102 | Temp 98.4°F | Ht 67.0 in | Wt 195.2 lb

## 2016-05-02 DIAGNOSIS — J329 Chronic sinusitis, unspecified: Secondary | ICD-10-CM

## 2016-05-02 DIAGNOSIS — B9689 Other specified bacterial agents as the cause of diseases classified elsewhere: Secondary | ICD-10-CM | POA: Diagnosis not present

## 2016-05-02 MED ORDER — DOXYCYCLINE HYCLATE 100 MG PO TABS: 100.0000 mg | ORAL_TABLET | Freq: Two times a day (BID) | ORAL | 0 refills | Status: DC

## 2016-05-02 NOTE — Progress Notes (Signed)
Pre visit review using our clinic review tool, if applicable. No additional management support is needed unless otherwise documented below in the visit note. 

## 2016-05-02 NOTE — Progress Notes (Signed)
PCP: Nyoka Cowden, MD  Subjective:  Michael Li is a 61 y.o. year old very pleasant male patient who presents with sinusitis symptoms including nasal congestion, cough, fatigue. Has felt some congestion in chest at times he thinks as well as some wheeze. Has had some bloody nose.  -other symptoms include: mild right maxillary sinus pressure. Not sleeping as well- feels like not thinking as clearly as not resting well.  -day of illness:at least 3 weeks -Symptoms show no change -previous treatments: alka seltzer plus OTC. States feels like he is in a fog with other OTC medicines -sick contacts/travel/risks: denies flu exposure.   ROS-denies fever, SOB, NVD, tooth pain  Pertinent Past Medical History-  Patient Active Problem List   Diagnosis Date Noted  . Acute medial meniscal tear 02/05/2016  . Diabetes mellitus type II, uncontrolled ([place]) 08/28/2013  . HNP (herniated nucleus pulposus), lumbar 09/12/2012  . 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 HEPATITIS C, CHRONIC 04/03/2007  . Diabetes mellitus, type II ([place]) 07/19/2006  . ERECTILE DYSFUNCTION 07/19/2006  . ATTENTION DEFICIT HYPERACTIVITY DISORDER 07/19/2006  . GERD 07/19/2006  . BENIGN PROSTATIC HYPERTROPHY 07/19/2006    Medications- reviewed  Current Outpatient Prescriptions  Medication Sig Dispense Refill  . amphetamine-dextroamphetamine (ADDERALL XR) 30 MG 24 hr capsule Take 1 capsule (30 mg total) by mouth every morning. 30 capsule 0  . amphetamine-dextroamphetamine (ADDERALL XR) 30 MG 24 hr capsule Take 1 capsule (30 mg total) by mouth daily. 30 capsule 0  . amphetamine-dextroamphetamine (ADDERALL XR) 30 MG 24 hr capsule Take 1 capsule (30 mg total) by mouth daily. 30 capsule 0  . amphetamine-dextroamphetamine (ADDERALL) 20 MG tablet Take 1 tablet (20 mg total) by mouth 3 (three) times daily. 90 tablet 0  . amphetamine-dextroamphetamine (ADDERALL) 20 MG tablet Take 1 tablet (20 mg total) by  mouth 3 (three) times daily. 90 tablet 0  . amphetamine-dextroamphetamine (ADDERALL) 20 MG tablet Take 1 tablet (20 mg total) by mouth 3 (three) times daily. 90 tablet 0  . aspirin 81 MG tablet Take 81 mg by mouth daily.      . diclofenac sodium (VOLTAREN) 1 % GEL Apply 4 g topically 4 (four) times daily. 5 Tube 0  . glucose blood (ONETOUCH VERIO) test strip Use as instructed to check blood sugar 3 times per day dx code E11.65 300 each 1  . Insulin Lispro, Human, (HUMALOG KWIKPEN) 200 UNIT/ML SOPN Inject 64 Units into the skin 3 (three) times daily. Inject 22 units at breakfast and lunch and 20 units before dinner (Patient taking differently: Inject into the skin 3 (three) times daily. Inject 15 units at breakfast and lunch and 18 units before dinner) 60 mL 1  . Insulin NPH, Human,, Isophane, (HUMULIN N) 100 UNIT/ML Kiwkpen Inject 42 Units into the skin at bedtime. (Patient taking differently: Inject 30 Units into the skin at bedtime. ) 45 mL 0  . Insulin Pen Needle (BD PEN NEEDLE NANO U/F) 32G X 4 MM MISC USE 5 PER DAY TO INJECT INSULIN AND VICTOZA 450 each 3  . Insulin Syringe-Needle U-100 (INSULIN SYRINGE .5CC/31GX5/16") 31G X 5/16" 0.5 ML MISC Inject 1 each as directed daily. 100 each 3  . INVOKANA 100 MG TABS tablet TAKE 1 TABLET BY MOUTH DAILY BEFORE BREAKFAST 30 tablet 3  . metFORMIN (GLUCOPHAGE) 1000 MG tablet TAKE 1 TABLET BY MOUTH TWICE DAILY WITH A MEAL 180 tablet 3  . Multiple Vitamin (MULTIVITAMIN WITH MINERALS) TABS Take 1 tablet  by mouth daily.    Marland Kitchen omeprazole (PRILOSEC) 20 MG capsule Take 1 capsule (20 mg total) by mouth daily. 90 capsule 3  . ONETOUCH DELICA LANCETS FINE MISC USE TO CHECK BLOOD SUGAR THREE TIMES A DAY 300 each 0  . oxyCODONE-acetaminophen (PERCOCET/ROXICET) 5-325 MG tablet     . tadalafil (CIALIS) 20 MG tablet Take 1 tablet (20 mg total) by mouth daily as needed for erectile dysfunction. 10 tablet 1  . TRULICITY 1.5 XM/4.6OE SOPN INJECT 1.5 MG UNDER THE SKIN ONCE A  WEEK 2 mL 0   No current facility-administered medications for this visit.     Objective: BP 132/82 (BP Location: Left Arm, Patient Position: Sitting, Cuff Size: Large)   Pulse (!) 102   Temp 98.4 F (36.9 C) (Oral)   Ht 5\' 7"  (1.702 m)   Wt 195 lb 3.2 oz (88.5 kg)   SpO2 95%   BMI 30.57 kg/m  Gen: NAD, resting comfortably HEENT: Turbinates erythematous with yellow drainage and also some dried blood in right nare, TM normal, pharynx mildly erythematous with no tonsilar exudate or edema, right maxillary sinus tenderness CV: RRR no murmurs rubs or gallops Lungs: CTAB no crackles, wheeze, rhonchi Ext: no edema Skin: warm, dry, no rash Neuro: grossly normal, moves all extremities  Assessment/Plan:  Sinsusitis (based on continued nasal congestion, throat drainage, right maxillary sinus pressure on exam) Bacterial based on: Symptoms >10 days.  Does also have some chest congestion and wheeze- we discussed bronchitis is possible but this would likely be viral. If does not respond to antibiotic, would consider prednisone  Treatment: -considered steroid: we opted out for now- would consider as next step -other symptomatic care with nasal saline rinse to keep things moisturized and reduce risk of nosebleeds -Antibiotic indicated: yes. Has not done well with z pack before. Keflex allergy of throat swelling so will avoid augmentin. Will trial doxycycline.   Finally, we reviewed reasons to return to care including if symptoms worsen or persist or new concerns arise (particularly fever or shortness of breath)  Meds ordered this encounter  Medications  . doxycycline (VIBRA-TABS) 100 MG tablet    Sig: Take 1 tablet (100 mg total) by mouth 2 (two) times daily.    Dispense:  14 tablet    Refill:  0    Garret Reddish, MD

## 2016-05-02 NOTE — Patient Instructions (Signed)
Sinsusitis (based on continued nasal congestion, throat drainage, right maxillary sinus pressure on exam) Bacterial based on: Symptoms >10 days.  Does also have some chest congestion and wheeze- we discussed bronchitis is possible but this would likely be viral. If does not respond to antibiotic, would consider prednisone  Treatment: -considered steroid: we opted out for now- would consider as next step -other symptomatic care with nasal saline rinse to keep things moisturized and reduce risk of nosebleeds -Antibiotic indicated: yes. Has not done well with z pack before. Keflex allergy of throat swelling so will avoid augmentin. Will trial doxycycline.   Finally, we reviewed reasons to return to care including if symptoms worsen or persist or new concerns arise (particularly fever or shortness of breath)  Meds ordered this encounter  Medications  . doxycycline (VIBRA-TABS) 100 MG tablet    Sig: Take 1 tablet (100 mg total) by mouth 2 (two) times daily.    Dispense:  14 tablet    Refill:  0

## 2016-05-12 ENCOUNTER — Telehealth: Payer: Self-pay | Admitting: Internal Medicine

## 2016-05-12 MED ORDER — AMPHETAMINE-DEXTROAMPHET ER 30 MG PO CP24: 30.0000 mg | ORAL_CAPSULE | ORAL | 0 refills | Status: DC

## 2016-05-12 MED ORDER — AMPHETAMINE-DEXTROAMPHETAMINE 20 MG PO TABS: 20.0000 mg | ORAL_TABLET | Freq: Three times a day (TID) | ORAL | 0 refills | Status: DC

## 2016-05-12 MED ORDER — AMPHETAMINE-DEXTROAMPHET ER 30 MG PO CP24: 30.0000 mg | ORAL_CAPSULE | Freq: Every day | ORAL | 0 refills | Status: DC

## 2016-05-12 NOTE — Telephone Encounter (Signed)
Rx was printed, awaiting to be signed

## 2016-05-12 NOTE — Telephone Encounter (Signed)
Pt needs new rx generic adderall xr 30 mg and generic adderall 20 mg

## 2016-05-16 NOTE — Telephone Encounter (Signed)
Pt notified Rx ready for pickup. Rx printed and signed.  

## 2016-06-03 ENCOUNTER — Other Ambulatory Visit: Payer: Managed Care, Other (non HMO)

## 2016-06-05 ENCOUNTER — Other Ambulatory Visit: Payer: Self-pay | Admitting: Endocrinology

## 2016-06-05 ENCOUNTER — Other Ambulatory Visit: Payer: Self-pay | Admitting: Family Medicine

## 2016-06-05 DIAGNOSIS — M25561 Pain in right knee: Secondary | ICD-10-CM

## 2016-06-07 ENCOUNTER — Ambulatory Visit: Payer: Managed Care, Other (non HMO) | Admitting: Endocrinology

## 2016-07-05 ENCOUNTER — Other Ambulatory Visit: Payer: Self-pay | Admitting: Endocrinology

## 2016-07-20 ENCOUNTER — Other Ambulatory Visit: Payer: Self-pay | Admitting: Internal Medicine

## 2016-08-08 ENCOUNTER — Other Ambulatory Visit: Payer: Self-pay | Admitting: Endocrinology

## 2016-08-14 ENCOUNTER — Other Ambulatory Visit: Payer: Self-pay | Admitting: Endocrinology

## 2016-08-26 ENCOUNTER — Other Ambulatory Visit: Payer: Self-pay | Admitting: Endocrinology

## 2016-08-29 ENCOUNTER — Telehealth: Payer: Self-pay | Admitting: Internal Medicine

## 2016-08-29 MED ORDER — AMPHETAMINE-DEXTROAMPHETAMINE 20 MG PO TABS: 20.0000 mg | ORAL_TABLET | Freq: Three times a day (TID) | ORAL | 0 refills | Status: DC

## 2016-08-29 MED ORDER — AMPHETAMINE-DEXTROAMPHET ER 30 MG PO CP24: 30.0000 mg | ORAL_CAPSULE | Freq: Every day | ORAL | 0 refills | Status: DC

## 2016-08-29 MED ORDER — AMPHETAMINE-DEXTROAMPHET ER 30 MG PO CP24: 30.0000 mg | ORAL_CAPSULE | ORAL | 0 refills | Status: DC

## 2016-08-29 NOTE — Telephone Encounter (Signed)
Needs to pick up his Rx for the next few months  amphetamine-dextroamphetamine (ADDERALL) 20 MG tablet  amphetamine-dextroamphetamine (ADDERALL XR) 30 MG 24 hr capsule

## 2016-08-29 NOTE — Telephone Encounter (Signed)
Pt notified Rx ready for pickup. Rx printed and signed.  

## 2016-09-01 ENCOUNTER — Other Ambulatory Visit: Payer: Self-pay | Admitting: Endocrinology

## 2016-09-19 ENCOUNTER — Other Ambulatory Visit: Payer: Self-pay | Admitting: Endocrinology

## 2016-10-02 ENCOUNTER — Other Ambulatory Visit: Payer: Self-pay | Admitting: Endocrinology

## 2016-10-06 ENCOUNTER — Other Ambulatory Visit: Payer: Self-pay | Admitting: Endocrinology

## 2016-10-16 ENCOUNTER — Other Ambulatory Visit: Payer: Self-pay | Admitting: Internal Medicine

## 2016-10-18 ENCOUNTER — Telehealth: Payer: Self-pay | Admitting: Endocrinology

## 2016-10-18 NOTE — Telephone Encounter (Signed)
MEDICATION:  TRULICITY 1.5 OO/8.7NZ SOPN  PHARMACY:    [facility], [place] [phone] ([phone] (Fax)     IS THIS A 90 DAY SUPPLY : Y  IS PATIENT OUT OF MEDICATION:  Y  IF NOT; HOW MUCH IS LEFT:   LAST APPOINTMENT DATE: 12/24/15  NEXT APPOINTMENT DATE: N/A  OTHER COMMENTS:    **Let patient know to contact pharmacy at the end of the day to make sure medication is ready. **  ** Please notify patient to allow 48-72 hours to process**  **Encourage patient to contact the pharmacy for refills or they can request refills through [facility]**

## 2016-10-21 ENCOUNTER — Other Ambulatory Visit: Payer: Self-pay

## 2016-10-21 MED ORDER — DULAGLUTIDE 1.5 MG/0.5ML ~~LOC~~ SOAJ: SUBCUTANEOUS | 0 refills | Status: DC

## 2016-10-21 NOTE — Telephone Encounter (Signed)
Patient trying to follow up on medication refill. Please call patient and advise.

## 2016-10-21 NOTE — Telephone Encounter (Signed)
Routing to you °

## 2016-10-21 NOTE — Telephone Encounter (Signed)
Called patient and let him know that I have sent this in his Trulicity to [facility] for him and advised that he needs to make a follow up appointment. Patient stated that he came to his last appointment and someone at the check in desk told him that it was an appointment to have his lab work done. I looked in his chart and it says "no show" for his last appointment but he stated that he did come in. He stated that he is going to call us next week to see if there is a cancellation on Tuesday so he can come then.

## 2016-10-25 ENCOUNTER — Ambulatory Visit (INDEPENDENT_AMBULATORY_CARE_PROVIDER_SITE_OTHER): Payer: 59 | Admitting: Internal Medicine

## 2016-10-25 ENCOUNTER — Encounter: Payer: Self-pay | Admitting: Internal Medicine

## 2016-10-25 VITALS — BP 150/90 | HR 94 | Temp 98.0°F | Wt 199.0 lb

## 2016-10-25 DIAGNOSIS — F900 Attention-deficit hyperactivity disorder, predominantly inattentive type: Secondary | ICD-10-CM

## 2016-10-25 DIAGNOSIS — I4891 Unspecified atrial fibrillation: Secondary | ICD-10-CM

## 2016-10-25 DIAGNOSIS — I499 Cardiac arrhythmia, unspecified: Secondary | ICD-10-CM | POA: Diagnosis not present

## 2016-10-25 DIAGNOSIS — R0600 Dyspnea, unspecified: Secondary | ICD-10-CM

## 2016-10-25 DIAGNOSIS — Z794 Long term (current) use of insulin: Secondary | ICD-10-CM

## 2016-10-25 DIAGNOSIS — E1100 Type 2 diabetes mellitus with hyperosmolarity without nonketotic hyperglycemic-hyperosmolar coma (NKHHC): Secondary | ICD-10-CM | POA: Diagnosis not present

## 2016-10-25 LAB — POCT GLYCOSYLATED HEMOGLOBIN (HGB A1C): Hemoglobin A1C: 6.4

## 2016-10-25 MED ORDER — INSULIN ISOPHANE HUMAN 100 UNIT/ML KWIKPEN: PEN_INJECTOR | SUBCUTANEOUS | 0 refills | Status: DC

## 2016-10-25 MED ORDER — APIXABAN 5 MG PO TABS: 5.0000 mg | ORAL_TABLET | Freq: Two times a day (BID) | ORAL | 6 refills | Status: DC

## 2016-10-25 MED ORDER — AMPHETAMINE-DEXTROAMPHET ER 30 MG PO CP24: 30.0000 mg | ORAL_CAPSULE | ORAL | 0 refills | Status: DC

## 2016-10-25 MED ORDER — INSULIN ISOPHANE HUMAN 100 UNIT/ML KWIKPEN: 30.0000 [IU] | PEN_INJECTOR | Freq: Every day | SUBCUTANEOUS | Status: DC

## 2016-10-25 MED ORDER — DULAGLUTIDE 1.5 MG/0.5ML ~~LOC~~ SOAJ: SUBCUTANEOUS | 6 refills | Status: DC

## 2016-10-25 MED ORDER — METOPROLOL TARTRATE 25 MG PO TABS
25.0000 mg | ORAL_TABLET | Freq: Two times a day (BID) | ORAL | 3 refills | Status: DC
Start: 2016-10-25 — End: 2017-09-22

## 2016-10-25 MED ORDER — AMPHETAMINE-DEXTROAMPHET ER 30 MG PO CP24: 30.0000 mg | ORAL_CAPSULE | Freq: Every day | ORAL | 0 refills | Status: DC

## 2016-10-25 MED ORDER — INSULIN LISPRO 200 UNIT/ML ~~LOC~~ SOPN: 12.0000 [IU] | PEN_INJECTOR | Freq: Three times a day (TID) | SUBCUTANEOUS | 1 refills | Status: DC

## 2016-10-25 MED ORDER — AMPHETAMINE-DEXTROAMPHETAMINE 20 MG PO TABS: 20.0000 mg | ORAL_TABLET | Freq: Three times a day (TID) | ORAL | 0 refills | Status: DC

## 2016-10-25 NOTE — Patient Instructions (Addendum)
Cardiology consultation as discussed  Discontinue aspirin therapy  Eliquis 5 mg twice daily  Metoprolol 25 mg twice daily Atrial Fibrillation Atrial fibrillation is a type of heartbeat that is irregular or fast (rapid). If you have this condition, your heart keeps quivering in a weird (chaotic) way. This condition can make it so your heart cannot pump blood normally. Having this condition gives a person more risk for stroke, heart failure, and other heart problems. There are different types of atrial fibrillation. Talk with your doctor to learn about the type that you have. Follow these instructions at home:  Take over-the-counter and prescription medicines only as told by your doctor.  If your doctor prescribed a blood-thinning medicine, take it exactly as told. Taking too much of it can cause bleeding. If you do not take enough of it, you will not have the protection that you need against stroke and other problems.  Do not use any tobacco products. These include cigarettes, chewing tobacco, and e-cigarettes. If you need help quitting, ask your doctor.  If you have apnea (obstructive sleep apnea), manage it as told by your doctor.  Do not drink alcohol.  Do not drink beverages that have caffeine. These include coffee, soda, and tea.  Maintain a healthy weight. Do not use diet pills unless your doctor says they are safe for you. Diet pills may make heart problems worse.  Follow diet instructions as told by your doctor.  Exercise regularly as told by your doctor.  Keep all follow-up visits as told by your doctor. This is important. Contact a doctor if:  You notice a change in the speed, rhythm, or strength of your heartbeat.  You are taking a blood-thinning medicine and you notice more bruising.  You get tired more easily when you move or exercise. Get help right away if:  You have pain in your chest or your belly (abdomen).  You have sweating or weakness.  You feel sick to  your stomach (nauseous).  You notice blood in your throw up (vomit), poop (stool), or pee (urine).  You are short of breath.  You suddenly have swollen feet and ankles.  You feel dizzy.  Your suddenly get weak or numb in your face, arms, or legs, especially if it happens on one side of your body.  You have trouble talking, trouble understanding, or both.  Your face or your eyelid droops on one side. These symptoms may be an emergency. Do not wait to see if the symptoms will go away. Get medical help right away. Call your local emergency services (911 in the [place] Do not drive yourself to the hospital. This information is not intended to replace advice given to you by your health care provider. Make sure you discuss any questions you have with your health care provider. Document Released: 11/10/2007 Document Revised: 07/09/2015 Document Reviewed: 05/28/2014 Elsevier Interactive Patient Education  [employer].

## 2016-10-25 NOTE — Progress Notes (Deleted)
  OFFICE VISIT NOTE Michael Li. [place], [place] [facility] [place]  MCCLELLAN DEMARAIS - 61 y.o. male MRN [id number]  Date of birth: 06/12/1955  Visit Date: 10/25/2016  PCP: Marletta Lor, MD   Referred by: Marletta Lor, MD  {Scribe Credentials} acting as scribe for Dr. Paulla Fore.  SUBJECTIVE:   Chief Complaint  Patient presents with  . Follow-up   HPI: As below and per problem based documentation when appropriate.  HPI  ROS  Otherwise per HPI.  HISTORY & PERTINENT PRIOR DATA:  No specialty comments available. He reports that he has quit smoking. He quit smokeless tobacco use about 9 years ago.  Recent Labs  12/24/15 1351 03/14/16 0923  HGBA1C 6.2 6.3   Medications & Allergies reviewed per EMR Patient Active Problem List   Diagnosis Date Noted  . Acute medial meniscal tear 02/05/2016  . Diabetes mellitus type II, uncontrolled ([place]) 08/28/2013  . HNP (herniated nucleus pulposus), lumbar 09/12/2012  . 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 HEPATITIS C, CHRONIC 04/03/2007  . Diabetes mellitus, type II ([place]) 07/19/2006  . ERECTILE DYSFUNCTION 07/19/2006  . ATTENTION DEFICIT HYPERACTIVITY DISORDER 07/19/2006  . GERD 07/19/2006  . BENIGN PROSTATIC HYPERTROPHY 07/19/2006   Past Medical History:  Diagnosis Date  . Anemia    rec'd 32 units of blood, post T&A, 1981  . ATTENTION DEFICIT HYPERACTIVITY DISORDER 07/19/2006  . BENIGN PROSTATIC HYPERTROPHY 07/19/2006  . DIABETES MELLITUS, TYPE II 07/19/2006  . ERECTILE DYSFUNCTION 07/19/2006  . GERD 07/19/2006  . HEPATITIS C, CHRONIC 04/03/2007  . NEOPLASM, MALIGNANT, CARCINOMA, BASAL CELL, NOSE 10/20/2009   Family History  Problem Relation Age of Onset  . Diabetes Maternal Aunt   . Cancer Paternal Grandmother        colon cancer  . Diabetes Maternal Aunt    Past Surgical History:  Procedure Laterality Date  . APPENDECTOMY    . carotid injury      post tonsillectomy, R carotid injury with trach  . HERNIA REPAIR  1973   inguinal- R side  . LUMBAR LAMINECTOMY/DECOMPRESSION MICRODISCECTOMY Right 09/12/2012   Procedure: LUMBAR LAMINECTOMY/DECOMPRESSION MICRODISCECTOMY 1 LEVEL;  Surgeon: Charlie Pitter, MD;  Location: [facility];  Service: Neurosurgery;  Laterality: Right;  Right lumbar three-four microdiscectomy  . TONSILLECTOMY    . [place]   Social History   Occupational History  . 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 Smoking status: Former Research scientist (life sciences)  . Smokeless tobacco: Former Systems developer    Quit date: 02/15/2007  . Alcohol use Not on file  . Drug use: No  . Sexual activity: Not on file    OBJECTIVE:  VS:  HT:    WT:199 lb (90.3 kg)  BMI:     BP:(!) 150/90  HR:94bpm  TEMP:98 F (36.7 C)(Oral)  RESP:97 % EXAM: No additional findings.    No results found. ASSESSMENT & PLAN:  { }

## 2016-10-25 NOTE — Progress Notes (Signed)
Subjective:    Patient ID: Michael Li, male    DOB: 1955/09/18, 61 y.o.   MRN: [id number]  HPI  61 year old patient who is seen today in follow-up.  He is followed by endocrine for diabetes with very nice glycemic control He presents with a 1-2 month history of shortness of breath with exertion.  Denies any chest pain He did have knee surgery about one year ago and has been sedentary, which has been associated with weight gain He has a significant tobacco history.  He smoked about 1.5 packs per day for 30 years and discontinued about 12 years ago Denies any cough or wheezing He has a history of ADHD and needs medications updated.  Past Medical History:  Diagnosis Date  . Anemia    rec'd 32 units of blood, post T&A, 1981  . ATTENTION DEFICIT HYPERACTIVITY DISORDER 07/19/2006  . BENIGN PROSTATIC HYPERTROPHY 07/19/2006  . DIABETES MELLITUS, TYPE II 07/19/2006  . ERECTILE DYSFUNCTION 07/19/2006  . GERD 07/19/2006  . HEPATITIS C, CHRONIC 04/03/2007  . NEOPLASM, MALIGNANT, CARCINOMA, BASAL CELL, NOSE 10/20/2009     Social History   Social History  . Marital status: Single    Spouse name: N/A  . Number of children: 2  . 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 Smoking status: Former Research scientist (life sciences)  . Smokeless tobacco: Former Systems developer    Quit date: 02/15/2007  . Alcohol use Not on file  . Drug use: No  . Sexual activity: Not on file   Other Topics Concern  . Not on file   Social History Narrative   Regular exercise-yes    Past Surgical History:  Procedure Laterality Date  . APPENDECTOMY    . carotid injury       post tonsillectomy, R carotid injury with trach  . HERNIA REPAIR  1973   inguinal- R side  . LUMBAR LAMINECTOMY/DECOMPRESSION MICRODISCECTOMY Right 09/12/2012   Procedure: LUMBAR LAMINECTOMY/DECOMPRESSION MICRODISCECTOMY 1 LEVEL;  Surgeon: Charlie Pitter, MD;  Location: [facility];  Service:  Neurosurgery;  Laterality: Right;  Right lumbar three-four microdiscectomy  . TONSILLECTOMY    . TRACHEOSTOMY CLOSURE  1981    Family History  Problem Relation Age of Onset  . Diabetes Maternal Aunt   . Cancer Paternal Grandmother        colon cancer  . Diabetes Maternal Aunt     Allergies  Allergen Reactions  . Cephalexin Swelling    In throat    Current Outpatient Prescriptions on File Prior to Visit  Medication Sig Dispense Refill  . amphetamine-dextroamphetamine (ADDERALL XR) 30 MG 24 hr capsule Take 1 capsule (30 mg total) by mouth every morning. 30 capsule 0  . amphetamine-dextroamphetamine (ADDERALL XR) 30 MG 24 hr capsule Take 1 capsule (30 mg total) by mouth daily. 30 capsule 0  . amphetamine-dextroamphetamine (ADDERALL XR) 30 MG 24 hr capsule Take 1 capsule (30 mg total) by mouth daily. 30 capsule 0  . amphetamine-dextroamphetamine (ADDERALL) 20 MG tablet Take 1 tablet (20 mg total) by mouth 3 (three) times daily. 90 tablet 0  . amphetamine-dextroamphetamine (ADDERALL) 20 MG tablet Take 1 tablet (20 mg total) by mouth 3 (three) times daily. 90 tablet 0  . amphetamine-dextroamphetamine (ADDERALL) 20 MG tablet Take 1 tablet (20 mg total) by mouth 3 (three) times daily. 90 tablet 0  . aspirin 81 MG tablet Take 81 mg by mouth daily.      Marland Kitchen  diclofenac sodium (VOLTAREN) 1 % GEL APPLY 4 GRAMS TOPICALLY TO THE AFFECTED AREA FOUR TIMES DAILY 500 g 0  . doxycycline (VIBRA-TABS) 100 MG tablet Take 1 tablet (100 mg total) by mouth 2 (two) times daily. 14 tablet 0  . Dulaglutide (TRULICITY) 1.5 HQ/4.6NG SOPN INJECT 1.5 MG UNDER THE SKIN ONCE A WEEK 2 mL 0  . glucose blood (ONETOUCH VERIO) test strip Use as instructed to check blood sugar 3 times per day dx code E11.65 300 each 1  . HUMULIN N KWIKPEN 100 UNIT/ML Kiwkpen INJECT 42 UNITS UNDER THE SKIN AT BEDTIME (NO FURTHER REFILLS UNTIL SEEN IN OFFICE) 9 mL 0  . Insulin Lispro, Human, (HUMALOG KWIKPEN) 200 UNIT/ML SOPN Inject 64 Units  into the skin 3 (three) times daily. Inject 22 units at breakfast and lunch and 20 units before dinner (Patient taking differently: Inject into the skin 3 (three) times daily. Inject 15 units at breakfast and lunch and 18 units before dinner) 60 mL 1  . Insulin NPH, Human,, Isophane, (HUMULIN N) 100 UNIT/ML Kiwkpen Inject 42 Units into the skin at bedtime. (Patient taking differently: Inject 30 Units into the skin at bedtime. ) 45 mL 0  . Insulin Pen Needle (BD PEN NEEDLE NANO U/F) 32G X 4 MM MISC USE 5 PER DAY TO INJECT INSULIN AND VICTOZA 450 each 3  . Insulin Syringe-Needle U-100 (INSULIN SYRINGE .5CC/31GX5/16") 31G X 5/16" 0.5 ML MISC Inject 1 each as directed daily. 100 each 3  . INVOKANA 100 MG TABS tablet TAKE 1 TABLET BY MOUTH DAILY BEFORE BREAKFAST 30 tablet 0  . metFORMIN (GLUCOPHAGE) 1000 MG tablet TAKE 1 TABLET TWICE A DAY WITH MEALS 180 tablet 3  . Multiple Vitamin (MULTIVITAMIN WITH MINERALS) TABS Take 1 tablet by mouth daily.    Marland Kitchen omeprazole (PRILOSEC) 20 MG capsule TAKE 1 CAPSULE(20 MG) BY MOUTH DAILY 90 capsule 0  . ONETOUCH DELICA LANCETS FINE MISC USE TO CHECK BLOOD SUGAR THREE TIMES A DAY 300 each 0  . oxyCODONE-acetaminophen (PERCOCET/ROXICET) 5-325 MG tablet     . tadalafil (CIALIS) 20 MG tablet Take 1 tablet (20 mg total) by mouth daily as needed for erectile dysfunction. 10 tablet 1  . [DISCONTINUED] insulin aspart (NOVOLOG FLEXPEN) 100 unit/mL SOLN FlexPen Inject subcutaneously (just before each meal) 22-22-20 units 25 pen 3   No current facility-administered medications on file prior to visit.     BP (!) 150/90 (BP Location: Left Arm, Patient Position: Sitting, Cuff Size: Normal)   Pulse 94   Temp 98 F (36.7 C) (Oral)   Wt 199 lb (90.3 kg)   SpO2 97%   BMI 31.17 kg/m   Wt Readings from Last 3 Encounters:  10/25/16 199 lb (90.3 kg)  05/02/16 195 lb 3.2 oz (88.5 kg)  03/17/16 199 lb (90.3 kg)      Review of Systems  Constitutional: Negative for appetite  change, chills, fatigue and fever.  HENT: Negative for congestion, dental problem, ear pain, hearing loss, sore throat, tinnitus, trouble swallowing and voice change.   Eyes: Negative for pain, discharge and visual disturbance.  Respiratory: Positive for shortness of breath. Negative for cough, chest tightness, wheezing and stridor.   Cardiovascular: Negative for chest pain, palpitations and leg swelling.  Gastrointestinal: Negative for abdominal distention, abdominal pain, blood in stool, constipation, diarrhea, nausea and vomiting.  Genitourinary: Negative for difficulty urinating, discharge, flank pain, genital sores, hematuria and urgency.  Musculoskeletal: Negative for arthralgias, back pain, gait problem, joint swelling, myalgias and  neck stiffness.  Skin: Negative for rash.  Neurological: Negative for dizziness, syncope, speech difficulty, weakness, numbness and headaches.  Hematological: Negative for adenopathy. Does not bruise/bleed easily.  Psychiatric/Behavioral: Negative for behavioral problems and dysphoric mood. The patient is not nervous/anxious.        Objective:   Physical Exam  Constitutional: He is oriented to person, place, and time. He appears well-developed.  HENT:  Head: Normocephalic.  Right Ear: External ear normal.  Left Ear: External ear normal.  Eyes: Conjunctivae and EOM are normal.  Neck: Normal range of motion.  Cardiovascular: Normal rate and normal heart sounds.   Rhythm irregular with runs of rapid rate  Pulmonary/Chest: Breath sounds normal.  O2 saturation 97%  Abdominal: Bowel sounds are normal.  Musculoskeletal: Normal range of motion. He exhibits no edema or tenderness.  Neurological: He is alert and oriented to person, place, and time.  Psychiatric: He has a normal mood and affect. His behavior is normal.          Assessment & Plan:   DOE. Secondary to atrial fibrillation Atrial fibrillation.  New onset with uncontrolled ventricular  response.  Will place on anticoagulation and start metoprolol 25 mg twice a day for rate control.  Will check 2-D echocardiogram and set up for cardiology consultation Diabetes mellitus.  Hemoglon A1c 6.4.  Continue Humulin N 32 units at bedtime and Humulin R 12 units prior to each meal ADHD History chronic hepatitis C  Nyoka Cowden

## 2016-10-27 ENCOUNTER — Telehealth: Payer: Self-pay | Admitting: Internal Medicine

## 2016-10-27 NOTE — Telephone Encounter (Signed)
Pt is calling to check the status of Trulicity and Humulin and wanted to make sure that they went to the correct pharmacy and they did.  Pt will call back to the pharmacy to see what is going on.

## 2016-10-28 MED ORDER — INSULIN ISOPHANE HUMAN 100 UNIT/ML KWIKPEN: 30.0000 [IU] | PEN_INJECTOR | Freq: Every day | SUBCUTANEOUS | Status: DC

## 2016-10-28 MED ORDER — DULAGLUTIDE 1.5 MG/0.5ML ~~LOC~~ SOAJ: SUBCUTANEOUS | 6 refills | Status: DC

## 2016-10-28 NOTE — Addendum Note (Signed)
Addended by: Abelardo Diesel on: 10/28/2016 10:50 AM   Modules accepted: Orders

## 2016-10-30 ENCOUNTER — Other Ambulatory Visit: Payer: Self-pay | Admitting: Endocrinology

## 2016-11-01 ENCOUNTER — Other Ambulatory Visit: Payer: Self-pay | Admitting: Internal Medicine

## 2016-11-01 MED ORDER — INSULIN ISOPHANE HUMAN 100 UNIT/ML KWIKPEN: PEN_INJECTOR | SUBCUTANEOUS | 6 refills | Status: DC

## 2016-11-03 ENCOUNTER — Encounter: Payer: Self-pay | Admitting: Internal Medicine

## 2016-11-07 ENCOUNTER — Ambulatory Visit (INDEPENDENT_AMBULATORY_CARE_PROVIDER_SITE_OTHER): Payer: 59 | Admitting: Internal Medicine

## 2016-11-07 ENCOUNTER — Other Ambulatory Visit: Payer: Self-pay | Admitting: Endocrinology

## 2016-11-07 ENCOUNTER — Encounter: Payer: Self-pay | Admitting: Internal Medicine

## 2016-11-07 VITALS — BP 110/66 | HR 73 | Ht 67.0 in | Wt 201.6 lb

## 2016-11-07 DIAGNOSIS — I4891 Unspecified atrial fibrillation: Secondary | ICD-10-CM

## 2016-11-07 DIAGNOSIS — R0602 Shortness of breath: Secondary | ICD-10-CM | POA: Diagnosis not present

## 2016-11-07 NOTE — Progress Notes (Signed)
Cardiology Office Note   Date:  11/07/2016   ID:  Tellis, Spivak [birth year], MRN [id number]  PCP:  Marletta Lor, MD  Cardiologist:   Dorris Carnes, MD   Pt referred for SOB and atrial fibrillation     History of Present Illness: Michael Li is a 61 y.o. male with a history of DM  FOllowd by Dr Carlean Jews of 1 to 2 month history of SOB    Seen ealier this month  Found to e in atrial fibrillatoin  Placed on anticoagulation.  Echo shceduled   Patient denies CP  Says that things have not been right   Even with addition of meds and slowing of HR he still feels SOB        Current Meds  Medication Sig  . amphetamine-dextroamphetamine (ADDERALL XR) 30 MG 24 hr capsule Take 1 capsule (30 mg total) by mouth daily.  Marland Kitchen amphetamine-dextroamphetamine (ADDERALL) 20 MG tablet Take 1 tablet (20 mg total) by mouth 3 (three) times daily.  Marland Kitchen apixaban (ELIQUIS) 5 MG TABS tablet Take 1 tablet (5 mg total) by mouth 2 (two) times daily.  . Dulaglutide (TRULICITY) 1.5 WU/9.8JX SOPN INJECT 1.5 MG UNDER THE SKIN ONCE A WEEK  . glucose blood (ONETOUCH VERIO) test strip Use as instructed to check blood sugar 3 times per day dx code E11.65  . Insulin Lispro (HUMALOG KWIKPEN) 200 UNIT/ML SOPN Inject 12 Units into the skin 3 (three) times daily.  . Insulin NPH, Human,, Isophane, (HUMULIN N KWIKPEN) 100 UNIT/ML Kiwkpen INJECT 42 UNITS UNDER THE SKIN AT BEDTIME  . Insulin Pen Needle (BD PEN NEEDLE NANO U/F) 32G X 4 MM MISC USE 5 PER DAY TO INJECT INSULIN AND VICTOZA  . Insulin Syringe-Needle U-100 (INSULIN SYRINGE .5CC/31GX5/16") 31G X 5/16" 0.5 ML MISC Inject 1 each as directed daily.  . INVOKANA 100 MG TABS tablet TAKE 1 TABLET BY MOUTH DAILY BEFORE BREAKFAST  . metFORMIN (GLUCOPHAGE) 1000 MG tablet TAKE 1 TABLET TWICE A DAY WITH MEALS  . metoprolol tartrate (LOPRESSOR) 25 MG tablet Take 1 tablet (25 mg total) by mouth 2 (two) times daily.  . Multiple Vitamin (MULTIVITAMIN WITH MINERALS) TABS  Take 1 tablet by mouth daily.  Marland Kitchen omeprazole (PRILOSEC) 20 MG capsule TAKE 1 CAPSULE(20 MG) BY MOUTH DAILY  . ONETOUCH DELICA LANCETS FINE MISC USE TO CHECK BLOOD SUGAR THREE TIMES A DAY     Allergies:   Cephalexin   Past Medical History:  Diagnosis Date  . Anemia    rec'd 32 units of blood, post T&A, 1981  . ATTENTION DEFICIT HYPERACTIVITY DISORDER 07/19/2006  . BENIGN PROSTATIC HYPERTROPHY 07/19/2006  . DIABETES MELLITUS, TYPE II 07/19/2006  . ERECTILE DYSFUNCTION 07/19/2006  . GERD 07/19/2006  . HEPATITIS C, CHRONIC 04/03/2007  . NEOPLASM, MALIGNANT, CARCINOMA, BASAL CELL, NOSE 10/20/2009    Past Surgical History:  Procedure Laterality Date  . APPENDECTOMY    . carotid injury       post tonsillectomy, R carotid injury with trach  . HERNIA REPAIR  1973   inguinal- R side  . LUMBAR LAMINECTOMY/DECOMPRESSION MICRODISCECTOMY Right 09/12/2012   Procedure: LUMBAR LAMINECTOMY/DECOMPRESSION MICRODISCECTOMY 1 LEVEL;  Surgeon: Charlie Pitter, MD;  Location: [facility];  Service: Neurosurgery;  Laterality: Right;  Right lumbar three-four microdiscectomy  . TONSILLECTOMY    . TRACHEOSTOMY CLOSURE  1981     Social History:  The patient  reports that he has quit smoking. He quit smokeless tobacco use  about 9 years ago. He reports that he does not use drugs.   Family History:  The patient's family history includes Cancer in his paternal grandmother; Diabetes in his maternal aunt and maternal aunt.    ROS:  Please see the history of present illness. All other systems are reviewed and  Negative to the above problem except as noted.    PHYSICAL EXAM: VS:  BP 110/66   Pulse 73   Ht 5\' 7"  (1.702 m)   Wt 201 lb 9.6 oz (91.4 kg)   BMI 31.58 kg/m   GEN: Well nourished, well developed, in no acute distress  HEENT: normal  Neck: no JVD, carotid bruits, or masses Cardiac: Irreg irreg ; no murmurs, rubs, or gallops,no edema  Respiratory:  clear to auscultation bilaterally, normal work of  breathing GI: soft, nontender, nondistended, + BS  No hepatomegaly  MS: no deformity Moving all extremities   Skin: warm and dry, no rash Neuro:  Strength and sensation are intact Psych: euthymic mood, full affect   EKG:  EKG is ordered today.  On 9/11  Atrial fib  111 bpm  Nonsspecific ST cahgnes   Atrial fib  73 bpm   Lipid Panel    Component Value Date/Time   CHOL 161 03/14/2016 0923   TRIG 111.0 03/14/2016 0923   HDL 42.70 03/14/2016 0923   CHOLHDL 4 03/14/2016 0923   VLDL 22.2 03/14/2016 0923   LDLCALC 96 03/14/2016 0923      Wt Readings from Last 3 Encounters:  11/07/16 201 lb 9.6 oz (91.4 kg)  10/25/16 199 lb (90.3 kg)  05/02/16 195 lb 3.2 oz (88.5 kg)      ASSESSMENT AND PLAN:  1  Atrial fibrillation.  Pt is currently rate controlled and on anticoagulation CHADSVASc = 1.  Still symptomatic   Discussed pathophys at length.  Discussed care plan options. For now I would recomm checking echo  COntinue rate control and anticoagulation for 1 month before cardioversion attempt  Keep on anticoag after   2  SOB   With SOB I would recomm given history of DM that he have a lexiscan myovue to r/o inducible ischemia   2  HL  LDL was 96 in Jan   Needs to be on a statin with history of DM   For now I would treat atrial fb  REview with pt on return  3  DM  Continue aggressive control    Current medicines are reviewed at length with the patient today.  The patient does not have concerns regarding medicines.  Signed, Dorris Carnes, MD  11/07/2016 8:13 AM    [address], [place], [place]  [address] Phone: [phone]; Fax: [phone]

## 2016-11-07 NOTE — Patient Instructions (Signed)
Your physician recommends that you continue on your current medications as directed. Please refer to the Current Medication list given to you today.  Your physician has requested that you have an echocardiogram. Echocardiography is a painless test that uses sound waves to create images of your heart. It provides your doctor with information about the size and shape of your heart and how well your heart's chambers and valves are working. This procedure takes approximately one hour. There are no restrictions for this procedure.  Your physician has requested that you have a lexiscan myoview. For further information please visit [email or web address]. Please follow instruction sheet, as given.  Your physician has recommended that you have a Cardioversion (DCCV). Electrical Cardioversion uses a jolt of electricity to your heart either through paddles or wired patches attached to your chest. This is a controlled, usually prescheduled, procedure. Defibrillation is done under light anesthesia in the hospital, and you usually go home the day of the procedure. This is done to get your heart back into a normal rhythm. You are not awake for the procedure. Please see the instruction sheet given to you today.

## 2016-11-08 NOTE — Telephone Encounter (Signed)
Refused ? ?

## 2016-11-08 NOTE — Telephone Encounter (Signed)
Last o/v 03/09/16 1 no show and a cancelatiion no future appointment scheduled- refill or refuse please advise

## 2016-11-08 NOTE — Telephone Encounter (Signed)
Needs to schedule follow-up for any refills

## 2016-11-09 ENCOUNTER — Telehealth: Payer: Self-pay | Admitting: *Deleted

## 2016-11-09 NOTE — Telephone Encounter (Signed)
Left message for patient to call back. He can speak with Smith Robert, [facility]. He is scheduled for lexi and echo on 11/25/16. If its convenient for him, we can move the testing up to next week. Plan is for cardioversion anytime after 10/12 (started eliquis 10/25/16).

## 2016-11-11 NOTE — Telephone Encounter (Signed)
Patient has called back and rescheduled echo, lexiscan tp 11/18/16.

## 2016-11-14 ENCOUNTER — Other Ambulatory Visit: Payer: Self-pay | Admitting: Endocrinology

## 2016-11-15 ENCOUNTER — Telehealth (HOSPITAL_COMMUNITY): Payer: Self-pay | Admitting: *Deleted

## 2016-11-15 NOTE — Telephone Encounter (Signed)
Patient given detailed instructions per Myocardial Perfusion Study Information Sheet for the test on 11/18/16. Patient notified to arrive 15 minutes early and that it is imperative to arrive on time for appointment to keep from having the test rescheduled.  If you need to cancel or reschedule your appointment, please call the office within 24 hours of your appointment. . Patient verbalized understanding. Kirstie Peri

## 2016-11-18 ENCOUNTER — Ambulatory Visit (HOSPITAL_BASED_OUTPATIENT_CLINIC_OR_DEPARTMENT_OTHER): Payer: 59

## 2016-11-18 ENCOUNTER — Ambulatory Visit (HOSPITAL_COMMUNITY): Payer: 59 | Attending: Cardiovascular Disease

## 2016-11-18 DIAGNOSIS — I071 Rheumatic tricuspid insufficiency: Secondary | ICD-10-CM | POA: Diagnosis not present

## 2016-11-18 DIAGNOSIS — R0602 Shortness of breath: Secondary | ICD-10-CM | POA: Insufficient documentation

## 2016-11-18 DIAGNOSIS — R0600 Dyspnea, unspecified: Secondary | ICD-10-CM | POA: Diagnosis not present

## 2016-11-18 DIAGNOSIS — I4891 Unspecified atrial fibrillation: Secondary | ICD-10-CM | POA: Diagnosis not present

## 2016-11-18 DIAGNOSIS — E119 Type 2 diabetes mellitus without complications: Secondary | ICD-10-CM | POA: Insufficient documentation

## 2016-11-18 LAB — MYOCARDIAL PERFUSION IMAGING
LV dias vol: 95 mL (ref 62–150)
LV sys vol: 38 mL
Peak HR: 86 {beats}/min
RATE: 0.26
Rest HR: 69 {beats}/min
SDS: 0
SRS: 4
SSS: 4
TID: 0.99

## 2016-11-18 MED ORDER — TECHNETIUM TC 99M TETROFOSMIN IV KIT
10.2000 | PACK | Freq: Once | INTRAVENOUS | Status: AC | PRN
  Administered 2016-11-18: 10.2 via INTRAVENOUS
  Filled 2016-11-18: qty 11

## 2016-11-18 MED ORDER — REGADENOSON 0.4 MG/5ML IV SOLN
0.4000 mg | Freq: Once | INTRAVENOUS | Status: AC
  Administered 2016-11-18: 0.4 mg via INTRAVENOUS

## 2016-11-18 MED ORDER — TECHNETIUM TC 99M TETROFOSMIN IV KIT
30.8000 | PACK | Freq: Once | INTRAVENOUS | Status: AC | PRN
  Administered 2016-11-18: 30.8 via INTRAVENOUS
  Filled 2016-11-18: qty 31

## 2016-11-21 ENCOUNTER — Telehealth: Payer: Self-pay | Admitting: Internal Medicine

## 2016-11-21 DIAGNOSIS — Z01812 Encounter for preprocedural laboratory examination: Secondary | ICD-10-CM

## 2016-11-21 NOTE — Telephone Encounter (Signed)
Left message for patient to call back. His myoview was reviewed by Dr. Harrington Challenger. The echo does not look to be reviewed.  I will send it to Dr. Harrington Challenger to review. Plan is for DCCV anytime on/after 11/25/16.

## 2016-11-21 NOTE — Telephone Encounter (Signed)
New message    Patient calling for results on echo and  Wants to know if  additional treatment needed. Please call

## 2016-11-22 ENCOUNTER — Encounter: Payer: Self-pay | Admitting: *Deleted

## 2016-11-22 NOTE — Telephone Encounter (Signed)
Follow up    Wants to do the cardioversion on Friday if possible

## 2016-11-22 NOTE — Telephone Encounter (Signed)
Reviewed insulin with patient. Corrected medication list to reflect he takes 32 units of NPH every bedtime.  Instructed to take 1/2 the dose the night before cardioversion. Instructed to hold metformin the morning of the cardioversion.

## 2016-11-22 NOTE — Telephone Encounter (Signed)
Scheduled DCCV for atrial fibrillation on Monday Oct 15 at 11:00 with Dr. Acie Fredrickson.   Pt needs to arrive by 9:30. Labs prior Will send message to precert. Pt will pick up instruction letter when he comes for blood work tomorrow.

## 2016-11-23 ENCOUNTER — Other Ambulatory Visit: Payer: 59 | Admitting: *Deleted

## 2016-11-23 DIAGNOSIS — Z01812 Encounter for preprocedural laboratory examination: Secondary | ICD-10-CM

## 2016-11-24 LAB — CBC
Hematocrit: 47.4 % (ref 37.5–51.0)
Hemoglobin: 16.8 g/dL (ref 13.0–17.7)
MCH: 30.4 pg (ref 26.6–33.0)
MCHC: 35.4 g/dL (ref 31.5–35.7)
MCV: 86 fL (ref 79–97)
Platelets: 187 10*3/uL (ref 150–379)
RBC: 5.52 x10E6/uL (ref 4.14–5.80)
RDW: 13.2 % (ref 12.3–15.4)
WBC: 6.3 10*3/uL (ref 3.4–10.8)

## 2016-11-24 LAB — BASIC METABOLIC PANEL
BUN/Creatinine Ratio: 22 (ref 10–24)
BUN: 22 mg/dL (ref 8–27)
CO2: 21 mmol/L (ref 20–29)
Calcium: 10 mg/dL (ref 8.6–10.2)
Chloride: 104 mmol/L (ref 96–106)
Creatinine, Ser: 1.02 mg/dL (ref 0.76–1.27)
GFR calc Af Amer: 91 mL/min/{1.73_m2} (ref >59)
GFR calc non Af Amer: 79 mL/min/{1.73_m2} (ref >59)
Glucose: 94 mg/dL (ref 65–99)
Potassium: 4.7 mmol/L (ref 3.5–5.2)
Sodium: 140 mmol/L (ref 134–144)

## 2016-11-25 ENCOUNTER — Encounter (HOSPITAL_COMMUNITY): Payer: 59

## 2016-11-25 ENCOUNTER — Other Ambulatory Visit (HOSPITAL_COMMUNITY): Payer: 59

## 2016-11-28 ENCOUNTER — Encounter (HOSPITAL_COMMUNITY)
Admission: RE | Disposition: A | Discharge status: Home / Self Care | Payer: Self-pay | Source: Ambulatory Visit | Attending: Cardiovascular Disease

## 2016-11-28 ENCOUNTER — Encounter (HOSPITAL_COMMUNITY): Payer: Self-pay | Admitting: Certified Registered Nurse Anesthetist

## 2016-11-28 ENCOUNTER — Ambulatory Visit (HOSPITAL_COMMUNITY)
Admission: RE | Admit: 2016-11-28 | Discharge: 2016-11-28 | Disposition: A | Discharge status: Home / Self Care | Payer: 59 | Source: Ambulatory Visit | Attending: Cardiovascular Disease | Admitting: Cardiovascular Disease

## 2016-11-28 DIAGNOSIS — Z85828 Personal history of other malignant neoplasm of skin: Secondary | ICD-10-CM | POA: Diagnosis not present

## 2016-11-28 DIAGNOSIS — N400 Enlarged prostate without lower urinary tract symptoms: Secondary | ICD-10-CM | POA: Diagnosis not present

## 2016-11-28 DIAGNOSIS — I4891 Unspecified atrial fibrillation: Secondary | ICD-10-CM | POA: Insufficient documentation

## 2016-11-28 DIAGNOSIS — E119 Type 2 diabetes mellitus without complications: Secondary | ICD-10-CM | POA: Diagnosis not present

## 2016-11-28 DIAGNOSIS — Z7901 Long term (current) use of anticoagulants: Secondary | ICD-10-CM | POA: Diagnosis not present

## 2016-11-28 DIAGNOSIS — K219 Gastro-esophageal reflux disease without esophagitis: Secondary | ICD-10-CM | POA: Diagnosis not present

## 2016-11-28 DIAGNOSIS — Z79899 Other long term (current) drug therapy: Secondary | ICD-10-CM | POA: Diagnosis not present

## 2016-11-28 DIAGNOSIS — Z87891 Personal history of nicotine dependence: Secondary | ICD-10-CM | POA: Insufficient documentation

## 2016-11-28 DIAGNOSIS — B182 Chronic viral hepatitis C: Secondary | ICD-10-CM | POA: Diagnosis not present

## 2016-11-28 DIAGNOSIS — Z794 Long term (current) use of insulin: Secondary | ICD-10-CM | POA: Insufficient documentation

## 2016-11-28 SURGERY — CANCELLED PROCEDURE

## 2016-11-28 MED ORDER — HYDROCORTISONE 1 % EX CREA: 1.0000 "application " | TOPICAL_CREAM | Freq: Three times a day (TID) | CUTANEOUS | Status: DC | PRN

## 2016-11-28 NOTE — Anesthesia Preprocedure Evaluation (Deleted)
Anesthesia Evaluation  Patient identified by MRN, date of birth, ID band Patient awake    Reviewed: Allergy & Precautions, H&P , NPO status , Patient's Chart, lab work & pertinent test results  History of Anesthesia Complications Negative for: history of anesthetic complications  Airway Mallampati: II  TM Distance: >3 FB Neck ROM: Full    Dental  (+) Teeth Intact, Dental Advisory Given   Pulmonary neg pulmonary ROS, former smoker,    Pulmonary exam normal breath sounds clear to auscultation       Cardiovascular Normal cardiovascular exam Rhythm:Regular Rate:Normal     Neuro/Psych    GI/Hepatic GERD  Medicated and Controlled,(+) Hepatitis -, C  Endo/Other  diabetes, Well Controlled, Type 2, Insulin Dependent  Renal/GU      Musculoskeletal   Abdominal Normal abdominal exam  (+)   Peds  Hematology   Anesthesia Other Findings   Reproductive/Obstetrics                             Anesthesia Physical  Anesthesia Plan  ASA: III  Anesthesia Plan: General   Post-op Pain Management:    Induction: Intravenous  PONV Risk Score and Plan: 2 and Propofol infusion and Treatment may vary due to age or medical condition  Airway Management Planned: Mask  Additional Equipment:   Intra-op Plan:   Post-operative Plan:   Informed Consent: I have reviewed the patients History and Physical, chart, labs and discussed the procedure including the risks, benefits and alternatives for the proposed anesthesia with the patient or authorized representative who has indicated his/her understanding and acceptance.   Dental advisory given  Plan Discussed with: CRNA, Anesthesiologist and Surgeon  Anesthesia Plan Comments:         Anesthesia Quick Evaluation

## 2016-11-28 NOTE — Interval H&P Note (Signed)
History and Physical Interval Note:  11/28/2016 3:05 PM  Michael Li  has presented today for surgery, with the diagnosis of AFIB  The various methods of treatment have been discussed with the patient and family. After consideration of risks, benefits and other options for treatment, the patient has consented to  Procedure(s): Cancelled Cardioversion as a surgical intervention .  The patient's history has been reviewed, patient examined, no change in status, stable for surgery.  I have reviewed the patient's chart and labs.  Questions were answered to the patient's satisfaction.    When the patient arrived, he was found to be in NSR. Cardioversion was cancelled.  He will follow up with Dr. Christinia Gully Nahser

## 2016-11-28 NOTE — Progress Notes (Signed)
Patient arrived for outpatient cardioversion, sinus rhythm on monitor, 12 lead EKG confirms same. Cardioversion cancelled per Dr. Acie Fredrickson for self conversion. Patient discharged to home.

## 2016-11-28 NOTE — H&P (View-Only) (Signed)
Cardiology Office Note   Date:  11/07/2016   ID:  Michael Li, Michael Li 1955-03-11, MRN [id number]  PCP:  Marletta Lor, MD  Cardiologist:   Dorris Carnes, MD   Pt referred for SOB and atrial fibrillation     History of Present Illness: Michael Li is a 60 y.o. male with a history of DM  FOllowd by Dr Carlean Jews of 1 to 2 month history of SOB    Seen ealier this month  Found to e in atrial fibrillatoin  Placed on anticoagulation.  Echo shceduled   Patient denies CP  Says that things have not been right   Even with addition of meds and slowing of HR he still feels SOB        Current Meds  Medication Sig  . amphetamine-dextroamphetamine (ADDERALL XR) 30 MG 24 hr capsule Take 1 capsule (30 mg total) by mouth daily.  Marland Kitchen amphetamine-dextroamphetamine (ADDERALL) 20 MG tablet Take 1 tablet (20 mg total) by mouth 3 (three) times daily.  Marland Kitchen apixaban (ELIQUIS) 5 MG TABS tablet Take 1 tablet (5 mg total) by mouth 2 (two) times daily.  . Dulaglutide (TRULICITY) 1.5 EX/5.1ZG SOPN INJECT 1.5 MG UNDER THE SKIN ONCE A WEEK  . glucose blood (ONETOUCH VERIO) test strip Use as instructed to check blood sugar 3 times per day dx code E11.65  . Insulin Lispro (HUMALOG KWIKPEN) 200 UNIT/ML SOPN Inject 12 Units into the skin 3 (three) times daily.  . Insulin NPH, Human,, Isophane, (HUMULIN N KWIKPEN) 100 UNIT/ML Kiwkpen INJECT 42 UNITS UNDER THE SKIN AT BEDTIME  . Insulin Pen Needle (BD PEN NEEDLE NANO U/F) 32G X 4 MM MISC USE 5 PER DAY TO INJECT INSULIN AND VICTOZA  . Insulin Syringe-Needle U-100 (INSULIN SYRINGE .5CC/31GX5/16") 31G X 5/16" 0.5 ML MISC Inject 1 each as directed daily.  . INVOKANA 100 MG TABS tablet TAKE 1 TABLET BY MOUTH DAILY BEFORE BREAKFAST  . metFORMIN (GLUCOPHAGE) 1000 MG tablet TAKE 1 TABLET TWICE A DAY WITH MEALS  . metoprolol tartrate (LOPRESSOR) 25 MG tablet Take 1 tablet (25 mg total) by mouth 2 (two) times daily.  . Multiple Vitamin (MULTIVITAMIN WITH MINERALS) TABS  Take 1 tablet by mouth daily.  Marland Kitchen omeprazole (PRILOSEC) 20 MG capsule TAKE 1 CAPSULE(20 MG) BY MOUTH DAILY  . ONETOUCH DELICA LANCETS FINE MISC USE TO CHECK BLOOD SUGAR THREE TIMES A DAY     Allergies:   Cephalexin   Past Medical History:  Diagnosis Date  . Anemia    rec'd 32 units of blood, post T&A, 1981  . ATTENTION DEFICIT HYPERACTIVITY DISORDER 07/19/2006  . BENIGN PROSTATIC HYPERTROPHY 07/19/2006  . DIABETES MELLITUS, TYPE II 07/19/2006  . ERECTILE DYSFUNCTION 07/19/2006  . GERD 07/19/2006  . HEPATITIS C, CHRONIC 04/03/2007  . NEOPLASM, MALIGNANT, CARCINOMA, BASAL CELL, NOSE 10/20/2009    Past Surgical History:  Procedure Laterality Date  . APPENDECTOMY    . carotid injury       post tonsillectomy, R carotid injury with trach  . HERNIA REPAIR  1973   inguinal- R side  . LUMBAR LAMINECTOMY/DECOMPRESSION MICRODISCECTOMY Right 09/12/2012   Procedure: LUMBAR LAMINECTOMY/DECOMPRESSION MICRODISCECTOMY 1 LEVEL;  Surgeon: Charlie Pitter, MD;  Location: [facility];  Service: Neurosurgery;  Laterality: Right;  Right lumbar three-four microdiscectomy  . TONSILLECTOMY    . TRACHEOSTOMY CLOSURE  1981     Social History:  The patient  reports that he has quit smoking. He quit smokeless tobacco use  about 9 years ago. He reports that he does not use drugs.   Family History:  The patient's family history includes Cancer in his paternal grandmother; Diabetes in his maternal aunt and maternal aunt.    ROS:  Please see the history of present illness. All other systems are reviewed and  Negative to the above problem except as noted.    PHYSICAL EXAM: VS:  BP 110/66   Pulse 73   Ht 5\' 7"  (1.702 m)   Wt 201 lb 9.6 oz (91.4 kg)   BMI 31.58 kg/m   GEN: Well nourished, well developed, in no acute distress  HEENT: normal  Neck: no JVD, carotid bruits, or masses Cardiac: Irreg irreg ; no murmurs, rubs, or gallops,no edema  Respiratory:  clear to auscultation bilaterally, normal work of  breathing GI: soft, nontender, nondistended, + BS  No hepatomegaly  MS: no deformity Moving all extremities   Skin: warm and dry, no rash Neuro:  Strength and sensation are intact Psych: euthymic mood, full affect   EKG:  EKG is ordered today.  On 9/11  Atrial fib  111 bpm  Nonsspecific ST cahgnes   Atrial fib  73 bpm   Lipid Panel    Component Value Date/Time   CHOL 161 03/14/2016 0923   TRIG 111.0 03/14/2016 0923   HDL 42.70 03/14/2016 0923   CHOLHDL 4 03/14/2016 0923   VLDL 22.2 03/14/2016 0923   LDLCALC 96 03/14/2016 0923      Wt Readings from Last 3 Encounters:  11/07/16 201 lb 9.6 oz (91.4 kg)  10/25/16 199 lb (90.3 kg)  05/02/16 195 lb 3.2 oz (88.5 kg)      ASSESSMENT AND PLAN:  1  Atrial fibrillation.  Pt is currently rate controlled and on anticoagulation CHADSVASc = 1.  Still symptomatic   Discussed pathophys at length.  Discussed care plan options. For now I would recomm checking echo  COntinue rate control and anticoagulation for 1 month before cardioversion attempt  Keep on anticoag after   2  SOB   With SOB I would recomm given history of DM that he have a lexiscan myovue to r/o inducible ischemia   2  HL  LDL was 96 in Jan   Needs to be on a statin with history of DM   For now I would treat atrial fb  REview with pt on return  3  DM  Continue aggressive control    Current medicines are reviewed at length with the patient today.  The patient does not have concerns regarding medicines.  Signed, Dorris Carnes, MD  11/07/2016 8:13 AM    [address], [place], [place]  [address] Phone: [phone]; Fax: [phone]

## 2016-12-09 ENCOUNTER — Other Ambulatory Visit: Payer: Self-pay | Admitting: Endocrinology

## 2016-12-13 ENCOUNTER — Other Ambulatory Visit: Payer: Self-pay | Admitting: Endocrinology

## 2016-12-14 NOTE — Telephone Encounter (Signed)
Last o/v 03/09/16 1 no-show 1 canceled and no future apt scheduled- refill or refuse please advise

## 2016-12-14 NOTE — Telephone Encounter (Signed)
Refuse with note to make appointment 

## 2016-12-26 ENCOUNTER — Ambulatory Visit (INDEPENDENT_AMBULATORY_CARE_PROVIDER_SITE_OTHER): Payer: 59 | Admitting: Internal Medicine

## 2016-12-26 ENCOUNTER — Encounter: Payer: Self-pay | Admitting: Internal Medicine

## 2016-12-26 VITALS — BP 162/90 | HR 71 | Temp 98.1°F | Ht 67.0 in | Wt 205.0 lb

## 2016-12-26 DIAGNOSIS — Z794 Long term (current) use of insulin: Secondary | ICD-10-CM

## 2016-12-26 DIAGNOSIS — E089 Diabetes mellitus due to underlying condition without complications: Secondary | ICD-10-CM | POA: Insufficient documentation

## 2016-12-26 DIAGNOSIS — E118 Type 2 diabetes mellitus with unspecified complications: Secondary | ICD-10-CM | POA: Insufficient documentation

## 2016-12-26 DIAGNOSIS — I480 Paroxysmal atrial fibrillation: Secondary | ICD-10-CM

## 2016-12-26 DIAGNOSIS — E119 Type 2 diabetes mellitus without complications: Secondary | ICD-10-CM | POA: Diagnosis not present

## 2016-12-26 MED ORDER — ATORVASTATIN CALCIUM 10 MG PO TABS: 10.0000 mg | ORAL_TABLET | Freq: Every day | ORAL | 3 refills | Status: DC

## 2016-12-26 MED ORDER — CANAGLIFLOZIN 100 MG PO TABS: ORAL_TABLET | ORAL | 4 refills | Status: DC

## 2016-12-26 NOTE — Progress Notes (Signed)
Subjective:    Patient ID: Michael Li, male    DOB: [birth year], 61 y.o.   MRN: [id number]  HPI  Lab Results  Component Value Date   HGBA1C 6.4 10/25/2016   61 year old patient who is seen today in follow-up.  He has a recent diagnosis of paroxysmal atrial fibrillation and has been evaluated by cardiology.  This has included a negative nuclear stress test.  He was scheduled for cardioversion but spontaneously converted to a normal sinus rhythm.  He was placed on anticoagulation due to a CHADs2 score of 2 (HTN and DM).  He feels well today and has no further dyspnea.  Past Medical History:  Diagnosis Date  . Anemia    rec'd 32 units of blood, post T&A, 1981  . ATTENTION DEFICIT HYPERACTIVITY DISORDER 07/19/2006  . BENIGN PROSTATIC HYPERTROPHY 07/19/2006  . DIABETES MELLITUS, TYPE II 07/19/2006  . ERECTILE DYSFUNCTION 07/19/2006  . GERD 07/19/2006  . HEPATITIS C, CHRONIC 04/03/2007  . NEOPLASM, MALIGNANT, CARCINOMA, BASAL CELL, NOSE 10/20/2009     Social History   Socioeconomic History  . Marital status: Single    Spouse name: Not on file  . Number of children: 2  . Years of education: Not on file  . Highest education level: Not on file  Social Needs  . Financial resource strain: Not on file  . Food insecurity - worry: Not on file  . Food insecurity - inability: Not on file  . Transportation needs - medical: Not on file  . Transportation needs - non-medical: Not on file  Occupational History  . Occupation: Best boy: [employer]  Tobacco Use  . Smoking status: Former Research scientist (life sciences)  . Smokeless tobacco: Former Systems developer    Quit date: 02/15/2007  Substance and Sexual Activity  . Alcohol use: Not on file  . Drug use: No  . Sexual activity: Not on file  Other Topics Concern  . Not on file  Social History Narrative   Regular exercise-yes    Past Surgical History:  Procedure Laterality Date  . APPENDECTOMY    . carotid injury       post tonsillectomy, R  carotid injury with trach  . HERNIA REPAIR  1973   inguinal- R side  . TONSILLECTOMY    . TRACHEOSTOMY CLOSURE  1981    Family History  Problem Relation Age of Onset  . Diabetes Maternal Aunt   . Cancer Paternal Grandmother        colon cancer  . Diabetes Maternal Aunt     Allergies  Allergen Reactions  . Cephalexin Swelling    In throat    Current Outpatient Medications on File Prior to Visit  Medication Sig Dispense Refill  . amphetamine-dextroamphetamine (ADDERALL XR) 30 MG 24 hr capsule Take 1 capsule (30 mg total) by mouth daily. 30 capsule 0  . amphetamine-dextroamphetamine (ADDERALL) 20 MG tablet Take 1 tablet (20 mg total) by mouth 3 (three) times daily. 90 tablet 0  . apixaban (ELIQUIS) 5 MG TABS tablet Take 1 tablet (5 mg total) by mouth 2 (two) times daily. 60 tablet 6  . Dulaglutide (TRULICITY) 1.5 JG/2.8ZM SOPN INJECT 1.5 MG UNDER THE SKIN ONCE A WEEK 2 mL 6  . glucose blood (ONETOUCH VERIO) test strip Use as instructed to check blood sugar 3 times per day dx code E11.65 300 each 1  . Insulin Lispro (HUMALOG KWIKPEN) 200 UNIT/ML SOPN Inject 12 Units into the skin 3 (three) times daily. 60 mL  1  . Insulin NPH, Human,, Isophane, (HUMULIN N KWIKPEN) 100 UNIT/ML Kiwkpen INJECT 42 UNITS UNDER THE SKIN AT BEDTIME (Patient taking differently: 32 Units. INJECT 42 UNITS UNDER THE SKIN AT BEDTIME) 9 mL 6  . Insulin Pen Needle (BD PEN NEEDLE NANO U/F) 32G X 4 MM MISC USE 5 PER DAY TO INJECT INSULIN AND VICTOZA 450 each 3  . Insulin Syringe-Needle U-100 (INSULIN SYRINGE .5CC/31GX5/16") 31G X 5/16" 0.5 ML MISC Inject 1 each as directed daily. 100 each 3  . metFORMIN (GLUCOPHAGE) 1000 MG tablet TAKE 1 TABLET TWICE A DAY WITH MEALS 180 tablet 3  . metoprolol tartrate (LOPRESSOR) 25 MG tablet Take 1 tablet (25 mg total) by mouth 2 (two) times daily. 180 tablet 3  . Multiple Vitamin (MULTIVITAMIN WITH MINERALS) TABS Take 1 tablet by mouth daily.    Marland Kitchen omeprazole (PRILOSEC) 20 MG capsule  TAKE 1 CAPSULE(20 MG) BY MOUTH DAILY 90 capsule 0  . ONETOUCH DELICA LANCETS FINE MISC USE TO CHECK BLOOD SUGAR THREE TIMES A DAY 300 each 0  . [DISCONTINUED] insulin aspart (NOVOLOG FLEXPEN) 100 unit/mL SOLN FlexPen Inject subcutaneously (just before each meal) 22-22-20 units 25 pen 3   No current facility-administered medications on file prior to visit.     BP (!) 162/90 (BP Location: Left Arm, Patient Position: Sitting, Cuff Size: Normal)   Pulse 71   Temp 98.1 F (36.7 C) (Oral)   Ht 5\' 7"  (1.702 m)   Wt 205 lb (93 kg)   SpO2 93%   BMI 32.11 kg/m     Review of Systems  Constitutional: Negative for appetite change, chills, fatigue and fever.  HENT: Negative for congestion, dental problem, ear pain, hearing loss, sore throat, tinnitus, trouble swallowing and voice change.   Eyes: Negative for pain, discharge and visual disturbance.  Respiratory: Negative for cough, chest tightness, wheezing and stridor.   Cardiovascular: Negative for chest pain, palpitations and leg swelling.  Gastrointestinal: Negative for abdominal distention, abdominal pain, blood in stool, constipation, diarrhea, nausea and vomiting.  Genitourinary: Negative for difficulty urinating, discharge, flank pain, genital sores, hematuria and urgency.  Musculoskeletal: Negative for arthralgias, back pain, gait problem, joint swelling, myalgias and neck stiffness.  Skin: Negative for rash.  Neurological: Negative for dizziness, syncope, speech difficulty, weakness, numbness and headaches.  Hematological: Negative for adenopathy. Does not bruise/bleed easily.  Psychiatric/Behavioral: Negative for behavioral problems and dysphoric mood. The patient is not nervous/anxious.        Objective:   Physical Exam  Constitutional: He is oriented to person, place, and time. He appears well-developed.  Blood pressure elevated on arrival.  Serial blood pressure readings as low as 120/82  HENT:  Head: Normocephalic.  Right  Ear: External ear normal.  Left Ear: External ear normal.  Eyes: Conjunctivae and EOM are normal.  Neck: Normal range of motion.  Cardiovascular: Normal rate, regular rhythm and normal heart sounds.  Rhythm remains regular  Pulmonary/Chest: Breath sounds normal.  Abdominal: Bowel sounds are normal.  Musculoskeletal: Normal range of motion. He exhibits no edema or tenderness.  Neurological: He is alert and oriented to person, place, and time.  Psychiatric: He has a normal mood and affect. His behavior is normal.          Assessment & Plan:   Paroxysmal atrial fibrillation.  Continue anticoagulation Diabetes mellitus.  Statin therapy discussed.  Will start low intensity atorvastatin Hypertension  Cardiology follow-up as scheduled Follow-up.  3-4 months  Nyoka Cowden

## 2016-12-26 NOTE — Patient Instructions (Signed)
Limit your sodium (Salt) intake   Please check your hemoglobin A1c every 3 months    It is important that you exercise regularly, at least 20 minutes 3 to 4 times per week.  If you develop chest pain or shortness of breath seek  medical attention.   

## 2017-01-11 ENCOUNTER — Other Ambulatory Visit: Payer: Self-pay | Admitting: Internal Medicine

## 2017-03-06 ENCOUNTER — Telehealth: Payer: Self-pay | Admitting: Internal Medicine

## 2017-03-06 NOTE — Telephone Encounter (Signed)
Copied from [facility] [id number]. Topic: Quick Communication - Rx Refill/Question >> Mar 06, 2017 10:58 AM Patrice Paradise wrote: Medication:  amphetamine-dextroamphetamine (ADDERALL XR) 30 MG 24 hr capsule  amphetamine-dextroamphetamine (ADDERALL) 20 MG tablet   Agent: Please be advised that RX refills may take up to 3 business days. We ask that you follow-up with your pharmacy.

## 2017-03-06 NOTE — Telephone Encounter (Signed)
Adderall refill. Last OV 10/25/16. Last refill 12/25/16.

## 2017-03-08 ENCOUNTER — Other Ambulatory Visit: Payer: Self-pay | Admitting: Internal Medicine

## 2017-03-08 MED ORDER — AMPHETAMINE-DEXTROAMPHETAMINE 20 MG PO TABS: 20.0000 mg | ORAL_TABLET | Freq: Three times a day (TID) | ORAL | 0 refills | Status: DC

## 2017-03-08 MED ORDER — AMPHETAMINE-DEXTROAMPHET ER 30 MG PO CP24: 30.0000 mg | ORAL_CAPSULE | Freq: Every day | ORAL | 0 refills | Status: DC

## 2017-03-08 MED ORDER — AMPHETAMINE-DEXTROAMPHET ER 30 MG PO CP24: 30.0000 mg | ORAL_CAPSULE | ORAL | 0 refills | Status: DC

## 2017-03-08 NOTE — Telephone Encounter (Signed)
Rx's printed awaiting to be signed by the MD.

## 2017-03-09 NOTE — Telephone Encounter (Signed)
Pt notified that Rx ready for pickup.

## 2017-04-04 ENCOUNTER — Other Ambulatory Visit: Payer: Self-pay | Admitting: Internal Medicine

## 2017-05-05 ENCOUNTER — Telehealth: Payer: Self-pay | Admitting: *Deleted

## 2017-05-05 NOTE — Telephone Encounter (Signed)
Prior auth for Eliquis 5mg  sent to [id number] and approval given.  CaseId:[id number];Status:Approved;Review Type:Prior Auth;Coverage Start Date:04/05/2017;Coverage End Date:05/05/2018 and I called [facility] and left a message on the voicemail with this information.

## 2017-05-15 ENCOUNTER — Encounter: Payer: Self-pay | Admitting: Internal Medicine

## 2017-05-15 ENCOUNTER — Ambulatory Visit (INDEPENDENT_AMBULATORY_CARE_PROVIDER_SITE_OTHER): Payer: 59 | Admitting: Internal Medicine

## 2017-05-15 ENCOUNTER — Ambulatory Visit: Payer: 59 | Admitting: Family Medicine

## 2017-05-15 VITALS — BP 100/60 | HR 74 | Temp 97.8°F | Wt 198.0 lb

## 2017-05-15 DIAGNOSIS — I480 Paroxysmal atrial fibrillation: Secondary | ICD-10-CM | POA: Diagnosis not present

## 2017-05-15 DIAGNOSIS — E119 Type 2 diabetes mellitus without complications: Secondary | ICD-10-CM | POA: Diagnosis not present

## 2017-05-15 DIAGNOSIS — M25511 Pain in right shoulder: Secondary | ICD-10-CM | POA: Diagnosis not present

## 2017-05-15 DIAGNOSIS — F900 Attention-deficit hyperactivity disorder, predominantly inattentive type: Secondary | ICD-10-CM | POA: Diagnosis not present

## 2017-05-15 DIAGNOSIS — S80812A Abrasion, left lower leg, initial encounter: Secondary | ICD-10-CM

## 2017-05-15 LAB — POCT GLYCOSYLATED HEMOGLOBIN (HGB A1C): Hemoglobin A1C: 6.2

## 2017-05-15 MED ORDER — AMPHETAMINE-DEXTROAMPHET ER 30 MG PO CP24: 30.0000 mg | ORAL_CAPSULE | ORAL | 0 refills | Status: DC

## 2017-05-15 MED ORDER — AMPHETAMINE-DEXTROAMPHETAMINE 20 MG PO TABS: 20.0000 mg | ORAL_TABLET | Freq: Three times a day (TID) | ORAL | 0 refills | Status: DC

## 2017-05-15 MED ORDER — AMPHETAMINE-DEXTROAMPHET ER 30 MG PO CP24: 30.0000 mg | ORAL_CAPSULE | Freq: Every day | ORAL | 0 refills | Status: DC

## 2017-05-15 NOTE — Progress Notes (Signed)
Subjective:    Patient ID: Michael Li, male    DOB: 12-30-1955, 62 y.o.   MRN: [id number]  HPI  Lab Results  Component Value Date   HGBA1C 6.4 10/25/2016   62 year old patient who has a history of type 2 diabetes.  He has ADHD.  Doing quite well until yesterday when he fell in his attic sustaining trauma mainly to the right shoulder area.  He complains of pain and decreased range of motion. He also sustained a abrasion over the left lower anterior leg region. He remains on Eliquis anticoagulation for atrial fibrillation.  Past Medical History:  Diagnosis Date  . Anemia    rec'd 32 units of blood, post T&A, 1981  . ATTENTION DEFICIT HYPERACTIVITY DISORDER 07/19/2006  . BENIGN PROSTATIC HYPERTROPHY 07/19/2006  . DIABETES MELLITUS, TYPE II 07/19/2006  . ERECTILE DYSFUNCTION 07/19/2006  . GERD 07/19/2006  . HEPATITIS C, CHRONIC 04/03/2007  . NEOPLASM, MALIGNANT, CARCINOMA, BASAL CELL, NOSE 10/20/2009     Social History   Socioeconomic History  . Marital status: Single    Spouse name: Not on file  . Number of children: 2  . Years of education: Not on file  . Highest education level: Not on file  Occupational History  . Occupation: Best boy: [employer]  . Financial resource strain: Not on file  . Food insecurity:    Worry: Not on file    Inability: Not on file  . Transportation needs:    Medical: Not on file    Non-medical: Not on file  Tobacco Use  . Smoking status: Former Research scientist (life sciences)  . Smokeless tobacco: Former Systems developer    Quit date: 02/15/2007  Substance and Sexual Activity  . Alcohol use: Not on file  . Drug use: No  . Sexual activity: Not on file  Lifestyle  . Physical activity:    Days per week: Not on file    Minutes per session: Not on file  . Stress: Not on file  Relationships  . Social connections:    Talks on phone: Not on file    Gets together: Not on file    Attends religious service: Not on file    Active member of club  or organization: Not on file    Attends meetings of clubs or organizations: Not on file    Relationship status: Not on file  . Intimate partner violence:    Fear of current or ex partner: Not on file    Emotionally abused: Not on file    Physically abused: Not on file    Forced sexual activity: Not on file  Other Topics Concern  . Not on file  Social History Narrative   Regular exercise-yes    Past Surgical History:  Procedure Laterality Date  . APPENDECTOMY    . carotid injury       post tonsillectomy, R carotid injury with trach  . HERNIA REPAIR  1973   inguinal- R side  . LUMBAR LAMINECTOMY/DECOMPRESSION MICRODISCECTOMY Right 09/12/2012   Procedure: LUMBAR LAMINECTOMY/DECOMPRESSION MICRODISCECTOMY 1 LEVEL;  Surgeon: Charlie Pitter, MD;  Location: [facility];  Service: Neurosurgery;  Laterality: Right;  Right lumbar three-four microdiscectomy  . TONSILLECTOMY    . TRACHEOSTOMY CLOSURE  1981    Family History  Problem Relation Age of Onset  . Diabetes Maternal Aunt   . Cancer Paternal Grandmother        colon cancer  . Diabetes Maternal Aunt  Allergies  Allergen Reactions  . Cephalexin Swelling    In throat    Current Outpatient Medications on File Prior to Visit  Medication Sig Dispense Refill  . amphetamine-dextroamphetamine (ADDERALL XR) 30 MG 24 hr capsule Take 1 capsule (30 mg total) by mouth daily. 30 capsule 0  . amphetamine-dextroamphetamine (ADDERALL XR) 30 MG 24 hr capsule Take 1 capsule (30 mg total) by mouth every morning. 30 capsule 0  . amphetamine-dextroamphetamine (ADDERALL XR) 30 MG 24 hr capsule Take 1 capsule (30 mg total) by mouth every morning. 30 capsule 0  . amphetamine-dextroamphetamine (ADDERALL) 20 MG tablet Take 1 tablet (20 mg total) by mouth 3 (three) times daily. 90 tablet 0  . amphetamine-dextroamphetamine (ADDERALL) 20 MG tablet Take 1 tablet (20 mg total) by mouth 3 (three) times daily. 90 tablet 0  . amphetamine-dextroamphetamine  (ADDERALL) 20 MG tablet Take 1 tablet (20 mg total) by mouth 3 (three) times daily. 90 tablet 0  . apixaban (ELIQUIS) 5 MG TABS tablet Take 1 tablet (5 mg total) by mouth 2 (two) times daily. 60 tablet 6  . atorvastatin (LIPITOR) 10 MG tablet Take 1 tablet (10 mg total) daily by mouth. 90 tablet 3  . canagliflozin (INVOKANA) 100 MG TABS tablet TAKE 1 TABLET BY MOUTH DAILY BEFORE BREAKFAST 90 tablet 4  . Dulaglutide (TRULICITY) 1.5 JI/9.6VE SOPN INJECT 1.5 MG UNDER THE SKIN ONCE A WEEK 2 mL 6  . glucose blood (ONETOUCH VERIO) test strip Use as instructed to check blood sugar 3 times per day dx code E11.65 300 each 1  . Insulin Lispro (HUMALOG KWIKPEN) 200 UNIT/ML SOPN Inject 12 Units into the skin 3 (three) times daily. 60 mL 1  . Insulin NPH, Human,, Isophane, (HUMULIN N KWIKPEN) 100 UNIT/ML Kiwkpen INJECT 42 UNITS UNDER THE SKIN AT BEDTIME (Patient taking differently: 32 Units. INJECT 42 UNITS UNDER THE SKIN AT BEDTIME) 9 mL 6  . Insulin Pen Needle (BD PEN NEEDLE NANO U/F) 32G X 4 MM MISC USE 5 PER DAY TO INJECT INSULIN AND VICTOZA 450 each 3  . Insulin Syringe-Needle U-100 (INSULIN SYRINGE .5CC/31GX5/16") 31G X 5/16" 0.5 ML MISC Inject 1 each as directed daily. 100 each 3  . metFORMIN (GLUCOPHAGE) 1000 MG tablet TAKE 1 TABLET TWICE A DAY WITH MEALS 180 tablet 3  . metoprolol tartrate (LOPRESSOR) 25 MG tablet Take 1 tablet (25 mg total) by mouth 2 (two) times daily. 180 tablet 3  . Multiple Vitamin (MULTIVITAMIN WITH MINERALS) TABS Take 1 tablet by mouth daily.    Marland Kitchen omeprazole (PRILOSEC) 20 MG capsule TAKE 1 CAPSULE BY MOUTH DAILY 90 capsule 0  . ONETOUCH DELICA LANCETS FINE MISC USE TO CHECK BLOOD SUGAR THREE TIMES A DAY 300 each 0  . [DISCONTINUED] insulin aspart (NOVOLOG FLEXPEN) 100 unit/mL SOLN FlexPen Inject subcutaneously (just before each meal) 22-22-20 units 25 pen 3   No current facility-administered medications on file prior to visit.     BP 100/60 (BP Location: Left Arm, Patient  Position: Sitting, Cuff Size: Large)   Pulse 74   Temp 97.8 F (36.6 C) (Oral)   Wt 198 lb (89.8 kg)   SpO2 93%   BMI 31.01 kg/m     Review of Systems  Constitutional: Negative for appetite change, chills, fatigue and fever.  HENT: Negative for congestion, dental problem, ear pain, hearing loss, sore throat, tinnitus, trouble swallowing and voice change.   Eyes: Negative for pain, discharge and visual disturbance.  Respiratory: Negative for cough, chest tightness,  wheezing and stridor.   Cardiovascular: Negative for chest pain, palpitations and leg swelling.  Gastrointestinal: Negative for abdominal distention, abdominal pain, blood in stool, constipation, diarrhea, nausea and vomiting.  Genitourinary: Negative for difficulty urinating, discharge, flank pain, genital sores, hematuria and urgency.  Musculoskeletal: Negative for arthralgias, back pain, gait problem, joint swelling, myalgias and neck stiffness.       Posttraumatic right shoulder pain  Skin: Positive for wound. Negative for rash.  Neurological: Negative for dizziness, syncope, speech difficulty, weakness, numbness and headaches.  Hematological: Negative for adenopathy. Does not bruise/bleed easily.  Psychiatric/Behavioral: Negative for behavioral problems and dysphoric mood. The patient is not nervous/anxious.        Objective:   Physical Exam  Constitutional: He appears well-nourished. No distress.  Musculoskeletal:  Decreased range of pain right shoulder.  No significant tenderness over the shoulder region or clavicle  Skin:  superficial abrasion over the anterior left lower leg This area was cleaned and dressed          Assessment & Plan:   Posttraumatic right shoulder pain.  Will place in a sling and continue ice therapy over the next 24-48 hours.  If pain or decreased range of motion persists will need imaging studies and referral Diabetes mellitus well controlled.  Hemoglobin A1c 6.2  ADHD.  Adderall  refilled Wound left anterior lower leg.  This is a superficial abrasion which was cleaned and dressed  Follow-up 6 months  KWIATKOWSKI,PETER Pilar Plate

## 2017-05-15 NOTE — Addendum Note (Signed)
Addended by: Gwenyth Ober R on: 05/15/2017 11:29 AM   Modules accepted: Orders

## 2017-05-15 NOTE — Patient Instructions (Signed)
You  may move around, but avoid painful motions and activities.  Apply ice to the sore area for 15 to 20 minutes 3 or 4 times daily for the next one or  two  Days.  Call or return to clinic prn if these symptoms worsen or fail to improve as anticipated.  Return in 6 months for follow-up

## 2017-05-16 ENCOUNTER — Telehealth (INDEPENDENT_AMBULATORY_CARE_PROVIDER_SITE_OTHER): Payer: Self-pay

## 2017-05-16 NOTE — Telephone Encounter (Signed)
Patient had left a VM stating that he had fallen on Sunday in his attic and hurt his right shoulder.  Stated that he did see his PCP on 05/15/17 and was told that nothing was broken.  No x-rays were taken.  Patient has appt.to see Michael Li on Wednesday, 05/17/17.

## 2017-05-17 ENCOUNTER — Encounter (INDEPENDENT_AMBULATORY_CARE_PROVIDER_SITE_OTHER): Payer: Self-pay | Admitting: Surgery

## 2017-05-17 ENCOUNTER — Ambulatory Visit (INDEPENDENT_AMBULATORY_CARE_PROVIDER_SITE_OTHER): Payer: 59 | Admitting: Surgery

## 2017-05-17 ENCOUNTER — Ambulatory Visit (INDEPENDENT_AMBULATORY_CARE_PROVIDER_SITE_OTHER): Payer: 59

## 2017-05-17 VITALS — Ht 67.0 in | Wt 198.0 lb

## 2017-05-17 DIAGNOSIS — M25511 Pain in right shoulder: Secondary | ICD-10-CM

## 2017-05-17 DIAGNOSIS — S43006A Unspecified dislocation of unspecified shoulder joint, initial encounter: Secondary | ICD-10-CM

## 2017-05-17 MED ORDER — METHOCARBAMOL 500 MG PO TABS
500.0000 mg | ORAL_TABLET | Freq: Three times a day (TID) | ORAL | 0 refills | Status: DC | PRN
Start: 2017-05-17 — End: 2018-01-22

## 2017-05-17 MED ORDER — HYDROCODONE-ACETAMINOPHEN 7.5-325 MG PO TABS: 1.0000 | ORAL_TABLET | Freq: Three times a day (TID) | ORAL | 0 refills | Status: DC | PRN

## 2017-05-17 NOTE — Progress Notes (Signed)
Office Visit Note   Patient: Michael Li           Date of Birth: March 02, 1955           MRN: [id number] Visit Date: 05/17/2017              Requested by: Marletta Lor, MD [place], [place] [address] PCP: Marletta Lor, MD   Assessment & Plan: Visit Diagnoses:  1. Acute pain of right shoulder   2. Dislocation of shoulder joint     Plan: With the patient's acute pain and instability after fall May 14, 2017 I recommend MRI right shoulder with contrast as soon as possible to rule out rotator cuff tear, Bank[place] lesion.  There is question of an inferior glenoid fracture seen on x-ray today..  I did review x-rays with Dr. Alphonzo Severance who was present in the office today and he agrees with this plan.  Patient was put in a shoulder immobilizer.  Advised that he should be on this as much as possible to help prevent further damage.  Follow-Up Instructions: Return in about 1 week (around 05/24/2017) for With Dr. Marlou Sa to review right shoulder MRI.   Orders:  Orders Placed This Encounter  Procedures  . XR Shoulder Right  . MR SHOULDER RIGHT W CONTRAST   Meds ordered this encounter  Medications  . HYDROcodone-acetaminophen (NORCO) 7.5-325 MG tablet    Sig: Take 1 tablet by mouth every 8 (eight) hours as needed for moderate pain.    Dispense:  30 tablet    Refill:  0  . methocarbamol (ROBAXIN) 500 MG tablet    Sig: Take 1 tablet (500 mg total) by mouth every 8 (eight) hours as needed for muscle spasms.    Dispense:  40 tablet    Refill:  0      Procedures: No procedures performed   Clinical Data: No additional findings.   Subjective: Chief Complaint  Patient presents with  . Right Shoulder - Pain    [place] on right shoulder 05/14/17    HPI 62 year old white male comes into the office today with complaints of right shoulder pain and instability.  States that May 14, 2017 on Sunday he was doing some work in his attic when he stumbled  suffering a direct impact to his right shoulder landing on a wooden beam.  He states that he talked to a physical therapist who is a Industrial/product designer of his and told him that he did not need to go to the emergency room and that it could possibly wait until Monday to see orthopedics.  He denies any problems with his shoulder before injury.  Since his fall he has been complaining of increased pain and decreased range of motion.  He also feels like his shoulder is popping and subluxing out of the joint.  He does not want to lift his arm greater than about 60-70 degrees flexion/abduction.  Pain when he lays on his right side.   Review of Systems No current cardiac pulmonary GI GU issues.    Objective: Vital Signs: Ht 5\' 7"  (1.702 m)   Wt 198 lb (89.8 kg)   BMI 31.01 kg/m   Physical Exam  Constitutional: He is oriented to person, place, and time. He appears well-developed. No distress.  HENT:  Head: Normocephalic and atraumatic.  Eyes: Pupils are equal, round, and reactive to light.  Neck: Normal range of motion.  Pulmonary/Chest: No respiratory distress.  Musculoskeletal:  Exam patient  has marked pain in the right shoulder with about 60-70 degrees flexion/abduction.  Also at about this range of motion he has positive apprehension and feels like his shoulder is subluxing.  I do feel his shoulder clunking when I attempt range of motion.  I am really not able to get a complete exam due to his apprehension and pain.  Neurovascularly intact.  Neurological: He is alert and oriented to person, place, and time.  Skin: Skin is warm and dry.  Psychiatric: He has a normal mood and affect.    Ortho Exam  Specialty Comments:  No specialty comments available.  Imaging: Xr Shoulder Right  Result Date: 05/17/2017 X-ray right shoulder today reviewed with Dr. Alphonzo Severance who was present.  Patient does have moderate degenerative changes acromioclavicular joint.  Looks like patient may have an inferior rim glenoid  fracture.    PMFS History: Patient Active Problem List   Diagnosis Date Noted  . Diabetes mellitus due to underlying condition, controlled ([facility]) 12/26/2016  . Type 2 diabetes mellitus without complications ([facility]) 46/27/0350  . Atrial fibrillation ([facility]) 10/25/2016  . Acute medial meniscal tear 02/05/2016  . HNP (herniated nucleus pulposus), lumbar 09/12/2012  . 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 HEPATITIS C, CHRONIC 04/03/2007  . ERECTILE DYSFUNCTION 07/19/2006  . ADHD 07/19/2006  . GERD 07/19/2006  . BENIGN PROSTATIC HYPERTROPHY 07/19/2006   Past Medical History:  Diagnosis Date  . Anemia    rec'd 32 units of blood, post T&A, 1981  . ATTENTION DEFICIT HYPERACTIVITY DISORDER 07/19/2006  . BENIGN PROSTATIC HYPERTROPHY 07/19/2006  . DIABETES MELLITUS, TYPE II 07/19/2006  . ERECTILE DYSFUNCTION 07/19/2006  . GERD 07/19/2006  . HEPATITIS C, CHRONIC 04/03/2007  . NEOPLASM, MALIGNANT, CARCINOMA, BASAL CELL, NOSE 10/20/2009    Family History  Problem Relation Age of Onset  . Diabetes Maternal Aunt   . Cancer Paternal Grandmother        colon cancer  . Diabetes Maternal Aunt     Past Surgical History:  Procedure Laterality Date  . APPENDECTOMY    . carotid injury       post tonsillectomy, R carotid injury with trach  . HERNIA REPAIR  1973   inguinal- R side  . LUMBAR LAMINECTOMY/DECOMPRESSION MICRODISCECTOMY Right 09/12/2012   Procedure: LUMBAR LAMINECTOMY/DECOMPRESSION MICRODISCECTOMY 1 LEVEL;  Surgeon: Charlie Pitter, MD;  Location: [facility];  Service: Neurosurgery;  Laterality: Right;  Right lumbar three-four microdiscectomy  . TONSILLECTOMY    . [place]   Social History   Occupational History  . Occupation: Best boy: [employer]  Tobacco Use  . Smoking status: Former Research scientist (life sciences)  . Smokeless tobacco: Former Systems developer    Quit date: 02/15/2007  Substance and Sexual Activity  . Alcohol use: Not on file  . Drug use: No    . Sexual activity: Not on file

## 2017-05-19 ENCOUNTER — Encounter: Payer: Self-pay | Admitting: Family Medicine

## 2017-05-19 ENCOUNTER — Ambulatory Visit (INDEPENDENT_AMBULATORY_CARE_PROVIDER_SITE_OTHER): Payer: 59 | Admitting: Family Medicine

## 2017-05-19 VITALS — BP 122/68 | HR 73 | Temp 97.7°F | Resp 12 | Ht 67.0 in | Wt 200.2 lb

## 2017-05-19 DIAGNOSIS — S8992XD Unspecified injury of left lower leg, subsequent encounter: Secondary | ICD-10-CM

## 2017-05-19 DIAGNOSIS — L03116 Cellulitis of left lower limb: Secondary | ICD-10-CM | POA: Diagnosis not present

## 2017-05-19 MED ORDER — SULFAMETHOXAZOLE-TRIMETHOPRIM 800-160 MG PO TABS: 1.0000 | ORAL_TABLET | Freq: Two times a day (BID) | ORAL | 0 refills | Status: AC

## 2017-05-19 NOTE — Patient Instructions (Signed)
  Michael Li I have seen you today for an acute visit.  A few things to remember from today's visit:   Left leg cellulitis - Plan: sulfamethoxazole-trimethoprim (BACTRIM DS,SEPTRA DS) 800-160 MG tablet  Injury of left lower extremity, subsequent encounter   Medications prescribed today are intended for short period of time and will not be refill upon request, a follow up appointment might be necessary to discuss continuation of of treatment if appropriate.  Leg elevation, keep area clean with soap and water, keep it uncovered, monitor for worsening symptoms. Warm compresses a few times per day. Follow-up with PCP in 7-10 days.   In general please monitor for signs of worsening symptoms and seek immediate medical attention if any concerning.    I hope you get better soon!

## 2017-05-19 NOTE — Progress Notes (Signed)
ACUTE VISIT   HPI:  Chief Complaint  Patient presents with  . Scrape on left shin    possible infection, cannot have surgery on left shoulder if infected    Michael Li is a 62 y.o. male, who is here today with his partner complaining of worsening erythema and edema of left pretibial area. He fell a few days ago, 05/14/17, causing right shoulder injury and left pretibial area superficial excoriation. He is following with orthopedist.  According to patient, he has right shoulder MRI scheduled for this coming Sunday and next week he is planning on having right shoulder surgery.  Orthopedics was concerned about possibility of infection on left pretibial area.   He has treated wound with hydrogen peroxide. Pain is mild.  Hx of DM II, BS has been "good." He has no noted fever, chills, unusual fatigue, skin rash, numbness, or tingling.   Review of Systems  Constitutional: Negative for chills, fatigue and fever.  Respiratory: Negative for shortness of breath and wheezing.   Cardiovascular: Positive for leg swelling.  Endocrine: Negative for polydipsia, polyphagia and polyuria.  Musculoskeletal: Negative for gait problem and myalgias.  Skin: Positive for wound. Negative for color change and pallor.  Neurological: Negative for syncope and weakness.      Current Outpatient Medications on File Prior to Visit  Medication Sig Dispense Refill  . amphetamine-dextroamphetamine (ADDERALL XR) 30 MG 24 hr capsule Take 1 capsule (30 mg total) by mouth every morning. 30 capsule 0  . amphetamine-dextroamphetamine (ADDERALL) 20 MG tablet Take 1 tablet (20 mg total) by mouth 3 (three) times daily. 90 tablet 0  . apixaban (ELIQUIS) 5 MG TABS tablet Take 1 tablet (5 mg total) by mouth 2 (two) times daily. 60 tablet 6  . atorvastatin (LIPITOR) 10 MG tablet Take 1 tablet (10 mg total) daily by mouth. 90 tablet 3  . canagliflozin (INVOKANA) 100 MG TABS tablet TAKE 1 TABLET BY MOUTH  DAILY BEFORE BREAKFAST 90 tablet 4  . Dulaglutide (TRULICITY) 1.5 QM/5.7QI SOPN INJECT 1.5 MG UNDER THE SKIN ONCE A WEEK 2 mL 6  . glucose blood (ONETOUCH VERIO) test strip Use as instructed to check blood sugar 3 times per day dx code E11.65 300 each 1  . HYDROcodone-acetaminophen (NORCO) 7.5-325 MG tablet Take 1 tablet by mouth every 8 (eight) hours as needed for moderate pain. 30 tablet 0  . Insulin Lispro (HUMALOG KWIKPEN) 200 UNIT/ML SOPN Inject 12 Units into the skin 3 (three) times daily. 60 mL 1  . Insulin NPH, Human,, Isophane, (HUMULIN N KWIKPEN) 100 UNIT/ML Kiwkpen INJECT 42 UNITS UNDER THE SKIN AT BEDTIME (Patient taking differently: 32 Units. INJECT 42 UNITS UNDER THE SKIN AT BEDTIME) 9 mL 6  . Insulin Pen Needle (BD PEN NEEDLE NANO U/F) 32G X 4 MM MISC USE 5 PER DAY TO INJECT INSULIN AND VICTOZA 450 each 3  . Insulin Syringe-Needle U-100 (INSULIN SYRINGE .5CC/31GX5/16") 31G X 5/16" 0.5 ML MISC Inject 1 each as directed daily. 100 each 3  . metFORMIN (GLUCOPHAGE) 1000 MG tablet TAKE 1 TABLET TWICE A DAY WITH MEALS 180 tablet 3  . methocarbamol (ROBAXIN) 500 MG tablet Take 1 tablet (500 mg total) by mouth every 8 (eight) hours as needed for muscle spasms. 40 tablet 0  . metoprolol tartrate (LOPRESSOR) 25 MG tablet Take 1 tablet (25 mg total) by mouth 2 (two) times daily. 180 tablet 3  . Multiple Vitamin (MULTIVITAMIN WITH MINERALS) TABS Take 1 tablet by  mouth daily.    Marland Kitchen omeprazole (PRILOSEC) 20 MG capsule TAKE 1 CAPSULE BY MOUTH DAILY 90 capsule 0  . ONETOUCH DELICA LANCETS FINE MISC USE TO CHECK BLOOD SUGAR THREE TIMES A DAY 300 each 0  . [DISCONTINUED] insulin aspart (NOVOLOG FLEXPEN) 100 unit/mL SOLN FlexPen Inject subcutaneously (just before each meal) 22-22-20 units 25 pen 3   No current facility-administered medications on file prior to visit.      Past Medical History:  Diagnosis Date  . Anemia    rec'd 32 units of blood, post T&A, 1981  . ATTENTION DEFICIT HYPERACTIVITY  DISORDER 07/19/2006  . BENIGN PROSTATIC HYPERTROPHY 07/19/2006  . DIABETES MELLITUS, TYPE II 07/19/2006  . ERECTILE DYSFUNCTION 07/19/2006  . GERD 07/19/2006  . HEPATITIS C, CHRONIC 04/03/2007  . NEOPLASM, MALIGNANT, CARCINOMA, BASAL CELL, NOSE 10/20/2009   Allergies  Allergen Reactions  . Cephalexin Swelling    In throat    Social History   Socioeconomic History  . Marital status: Single    Spouse name: Not on file  . Number of children: 2  . Years of education: Not on file  . Highest education level: Not on file  Occupational History  . Occupation: Best boy: [employer]  . Financial resource strain: Not on file  . Food insecurity:    Worry: Not on file    Inability: Not on file  . Transportation needs:    Medical: Not on file    Non-medical: Not on file  Tobacco Use  . Smoking status: Former Research scientist (life sciences)  . Smokeless tobacco: Former Systems developer    Quit date: 02/15/2007  Substance and Sexual Activity  . Alcohol use: Not on file  . Drug use: No  . Sexual activity: Not on file  Lifestyle  . Physical activity:    Days per week: Not on file    Minutes per session: Not on file  . Stress: Not on file  Relationships  . Social connections:    Talks on phone: Not on file    Gets together: Not on file    Attends religious service: Not on file    Active member of club or organization: Not on file    Attends meetings of clubs or organizations: Not on file    Relationship status: Not on file  Other Topics Concern  . Not on file  Social History Narrative   Regular exercise-yes    Vitals:   05/19/17 1221  BP: 122/68  Pulse: 73  Resp: 12  Temp: 97.7 F (36.5 C)  SpO2: 97%   Body mass index is 31.36 kg/m.   Physical Exam  Nursing note and vitals reviewed. Constitutional: He appears well-developed. No distress.  HENT:  Head: Normocephalic and atraumatic.  Mouth/Throat: Oropharynx is clear and moist and mucous membranes are normal.  Eyes:  Conjunctivae are normal.  Cardiovascular: Normal rate and regular rhythm.  No murmur heard. Respiratory: Effort normal and breath sounds normal. No respiratory distress.  Musculoskeletal: He exhibits edema (Trace pitting edema, periankle LLE). He exhibits no tenderness.       Left knee: No tenderness found.       Left ankle: No lateral malleolus and no medial malleolus tenderness found.       Legs: Right UE sling.  Neurological: He has normal strength. Gait normal.  Skin: Skin is warm. Ecchymosis noted. No rash noted. There is erythema.  Superficial excoriation covered by brown crust on mid  pretibial area of  LLE, about 6-7 cm cm. There is a fluctuant area medial to crust,surrounding erythema, no local heat. There is not drainage upon presentation. Ecchymoses under left medial malleolus.   Psychiatric: He has a normal mood and affect.  Well groomed, good eye contact.     ASSESSMENT AND PLAN:   Michael Li was seen today for scrape on left shin.  Diagnoses and all orders for this visit:  Left leg cellulitis  After cleaning the area several times with alcohol, I did stick fluctuant area x 1 with  22 gouge needle, no purulent discharge, blood drained. It seems to be a hematoma. No abscess but rather mild cellulitis. Recommend warm compresses, lower extremity elevation, and oral antibiotic for 7 days.  He was instructed about warning signs: Fever, chills, worsening pain among some. Recommend follow-up with PCP in 7-10 days, before if needed.  -     sulfamethoxazole-trimethoprim (BACTRIM DS,SEPTRA DS) 800-160 MG tablet; Take 1 tablet by mouth 2 (two) times daily for 7 days.  Injury of left lower extremity, subsequent encounter  Recommend keeping wound clean with soap and water. Avoid skin irritants like peroxide.   Return in about 1 week (around 05/26/2017) for LLE cellulitis..     -Michael Li was advised to seek immediate medical attention if sudden worsening  symptoms.      Betty G. Martinique, MD  [facility]. [place] office.

## 2017-05-21 ENCOUNTER — Ambulatory Visit
Admission: RE | Admit: 2017-05-21 | Discharge: 2017-05-21 | Disposition: A | Discharge status: Home / Self Care | Payer: 59 | Source: Ambulatory Visit | Attending: Surgery | Admitting: Surgery

## 2017-05-21 DIAGNOSIS — S43006A Unspecified dislocation of unspecified shoulder joint, initial encounter: Secondary | ICD-10-CM

## 2017-05-21 DIAGNOSIS — M25511 Pain in right shoulder: Secondary | ICD-10-CM

## 2017-05-21 MED ORDER — GADOBENATE DIMEGLUMINE 529 MG/ML IV SOLN
20.0000 mL | Freq: Once | INTRAVENOUS | Status: AC | PRN
  Administered 2017-05-21: 20 mL via INTRAVENOUS

## 2017-05-25 ENCOUNTER — Ambulatory Visit (INDEPENDENT_AMBULATORY_CARE_PROVIDER_SITE_OTHER): Payer: 59 | Admitting: Orthopedic Surgery

## 2017-05-25 ENCOUNTER — Telehealth (INDEPENDENT_AMBULATORY_CARE_PROVIDER_SITE_OTHER): Payer: Self-pay

## 2017-05-25 ENCOUNTER — Encounter (INDEPENDENT_AMBULATORY_CARE_PROVIDER_SITE_OTHER): Payer: Self-pay | Admitting: Orthopedic Surgery

## 2017-05-25 DIAGNOSIS — S43006A Unspecified dislocation of unspecified shoulder joint, initial encounter: Secondary | ICD-10-CM

## 2017-05-25 NOTE — Telephone Encounter (Signed)
I put in an order for CT ordered by Dr Marlou Sa that he wants done tomorrow. Can we get auth'd by insurance?

## 2017-05-25 NOTE — Progress Notes (Signed)
Office Visit Note   Patient: Michael Li           Date of Birth: 1955/10/01           MRN: [id number] Visit Date: 05/25/2017 Requested by: Marletta Lor, MD [place], [place] [address] PCP: Marletta Lor, MD  Subjective: Chief Complaint  Patient presents with  . Right Shoulder - Follow-up    HPI: Michael Li is a patient with right shoulder pain.  Date of injury 05/14/2017.  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  Since that time he is tried moving his shoulder but it dislocates and subluxate's whenever he moves it.  He has since been in a sling.  He was seen by the PA last week.  MRI scan was ordered and that is reviewed today.  He does have a small full with supraspinatus tear but also significant bony Bankart lesion off the anterior glenoid rim.  This is likely the cause of his recurrent instability.  The exact amount and comminution of the fracture is difficult to determine based on the MRI scan.  It does look like it involves over 20% of the anterior posterior width of the glenoid.  He takes Norco at night.  Patient does have diabetes with hemoglobin A1c 6.2 and takes Eliquis for A. fib.  Currently he is converted.  He is also currently on antibiotics for left leg cellulitis.              ROS: All systems reviewed are negative as they relate to the chief complaint within the history of present illness.  Patient denies  fevers or chills. Impression is significant right shoulder instability with large bony Bankart lesion  Assessment & Plan: Visit Diagnoses:  1. Dislocation of shoulder joint     Plan: Affecting the right shoulder.  He also has a rotator cuff tear.  The cuff tear is smaller.  We need CT scan to evaluate the glenoid bony anatomy to determine whether or not an open versus arthroscopic procedure is possible and whether or not a laterjet procedure versus open reduction internal fixation procedure will be required for surgical stabilization.  The  risk and benefits of surgery are discussed including but not limited to infection nerve vessel damage nonunion malunion as well as potential need for more surgery.  Patient understands and wishes to proceed with surgical intervention which we need to do in the near future before bony consolidation in an abnormal position occurs.  Follow-Up Instructions: No follow-ups on file.   Orders:  Orders Placed This Encounter  Procedures  . CT SHOULDER RIGHT WO CONTRAST   No orders of the defined types were placed in this encounter.     Procedures: No procedures performed   Clinical Data: No additional findings.  Objective: Vital Signs: There were no vitals taken for this visit.  Physical Exam:   Constitutional: Patient appears well-developed HEENT:  Head: Normocephalic Eyes:EOM are normal Neck: Normal range of motion Cardiovascular: Normal rate Pulmonary/chest: Effort normal Neurologic: Patient is alert Skin: Skin is warm Psychiatric: Patient has normal mood and affect    Ortho Exam: Orthopedic exam demonstrates good motor sensory function to the right hand.  Deltoid does fire.  Did not really take his arm out of the sling much more than that due to shoulder instability.  Specialty Comments:  No specialty comments available.  Imaging: No results found.   PMFS History: Patient Active Problem List   Diagnosis Date Noted  .  Diabetes mellitus due to underlying condition, controlled ([place]) 12/26/2016  . Type 2 diabetes mellitus without complications ([place]) 31/51/7616  . Atrial fibrillation ([place]) 10/25/2016  . Acute medial meniscal tear 02/05/2016  . HNP (herniated nucleus pulposus), lumbar 09/12/2012  . 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 HEPATITIS C, CHRONIC 04/03/2007  . ERECTILE DYSFUNCTION 07/19/2006  . ADHD 07/19/2006  . GERD 07/19/2006  . BENIGN PROSTATIC HYPERTROPHY 07/19/2006   Past Medical History:  Diagnosis Date  . Anemia    rec'd 32  units of blood, post T&A, 1981  . ATTENTION DEFICIT HYPERACTIVITY DISORDER 07/19/2006  . BENIGN PROSTATIC HYPERTROPHY 07/19/2006  . DIABETES MELLITUS, TYPE II 07/19/2006  . ERECTILE DYSFUNCTION 07/19/2006  . GERD 07/19/2006  . HEPATITIS C, CHRONIC 04/03/2007  . NEOPLASM, MALIGNANT, CARCINOMA, BASAL CELL, NOSE 10/20/2009    Family History  Problem Relation Age of Onset  . Diabetes Maternal Aunt   . Cancer Paternal Grandmother        colon cancer  . Diabetes Maternal Aunt     Past Surgical History:  Procedure Laterality Date  . APPENDECTOMY    . carotid injury       post tonsillectomy, R carotid injury with trach  . HERNIA REPAIR  1973   inguinal- R side  . LUMBAR LAMINECTOMY/DECOMPRESSION MICRODISCECTOMY Right 09/12/2012   Procedure: LUMBAR LAMINECTOMY/DECOMPRESSION MICRODISCECTOMY 1 LEVEL;  Surgeon: Charlie Pitter, MD;  Location: [place] [facility];  Service: Neurosurgery;  Laterality: Right;  Right lumbar three-four microdiscectomy  . TONSILLECTOMY    . [place]   Social History   Occupational History  . Occupation: Best boy: [employer]  Tobacco Use  . Smoking status: Former Research scientist (life sciences)  . Smokeless tobacco: Former Systems developer    Quit date: 02/15/2007  Substance and Sexual Activity  . Alcohol use: Not on file  . Drug use: No  . Sexual activity: Not on file

## 2017-05-26 ENCOUNTER — Other Ambulatory Visit (INDEPENDENT_AMBULATORY_CARE_PROVIDER_SITE_OTHER): Payer: Self-pay | Admitting: Orthopedic Surgery

## 2017-05-26 ENCOUNTER — Ambulatory Visit
Admission: RE | Admit: 2017-05-26 | Discharge: 2017-05-26 | Disposition: A | Discharge status: Home / Self Care | Payer: 59 | Source: Ambulatory Visit | Attending: Orthopedic Surgery | Admitting: Orthopedic Surgery

## 2017-05-26 DIAGNOSIS — S43006A Unspecified dislocation of unspecified shoulder joint, initial encounter: Secondary | ICD-10-CM

## 2017-05-26 DIAGNOSIS — S43004A Unspecified dislocation of right shoulder joint, initial encounter: Secondary | ICD-10-CM

## 2017-05-30 ENCOUNTER — Telehealth (INDEPENDENT_AMBULATORY_CARE_PROVIDER_SITE_OTHER): Payer: Self-pay

## 2017-05-30 NOTE — Telephone Encounter (Signed)
Received voicemail  from patient regarding his shoulder surgery. He stated that no one has called him about the results of his CT scan. He also stated that surgery was supposed to be done this week but not yet scheduled. Please call CT results to patient. I am certain that he has already been scheduled for surgery. Thanks.  [phone] is contact for patient.

## 2017-06-01 ENCOUNTER — Other Ambulatory Visit (INDEPENDENT_AMBULATORY_CARE_PROVIDER_SITE_OTHER): Payer: Self-pay | Admitting: Orthopedic Surgery

## 2017-06-01 ENCOUNTER — Other Ambulatory Visit: Payer: Self-pay

## 2017-06-01 ENCOUNTER — Encounter (HOSPITAL_COMMUNITY): Payer: Self-pay | Admitting: *Deleted

## 2017-06-01 DIAGNOSIS — S42141A Displaced fracture of glenoid cavity of scapula, right shoulder, initial encounter for closed fracture: Secondary | ICD-10-CM

## 2017-06-01 DIAGNOSIS — S42151A Displaced fracture of neck of scapula, right shoulder, initial encounter for closed fracture: Principal | ICD-10-CM

## 2017-06-01 NOTE — H&P (Signed)
Michael Li is an 62 y.o. male.   Chief Complaint: Right shoulder pain and instability HPI: Michael Li is a 62 year old patient with right shoulder pain and instability.  He had a fall 3 weeks ago.  He was evaluated here in the office by 1 of our extended providers.  He had significant shoulder instability with any attempted abduction.  Subsequent MRI and CT scan demonstrates non-retracted rotator cuff tear of the supraspinatus along with significant glenoid fracture involving about a 33% of the inferior with an circumference of that glenoid.  Patient is diabetic.  At  Past Medical History:  Diagnosis Date  . Anemia    rec'd 32 units of blood, post T&A, 1981  . ATTENTION DEFICIT HYPERACTIVITY DISORDER 07/19/2006  . BENIGN PROSTATIC HYPERTROPHY 07/19/2006  . DIABETES MELLITUS, TYPE II 07/19/2006  . Dyspnea    had shortness of breath when heart was irregular  . ERECTILE DYSFUNCTION 07/19/2006  . GERD 07/19/2006  . HEPATITIS C, CHRONIC 04/03/2007   Treated with [place]   . History of blood transfusion   . NEOPLASM, MALIGNANT, CARCINOMA, BASAL CELL, NOSE 10/20/2009   Basal cell    Past Surgical History:  Procedure Laterality Date  . APPENDECTOMY    . carotid injury       post tonsillectomy, R carotid injury with trach  . HERNIA REPAIR  1973   inguinal- R side  . LUMBAR LAMINECTOMY/DECOMPRESSION MICRODISCECTOMY Right 09/12/2012   Procedure: LUMBAR LAMINECTOMY/DECOMPRESSION MICRODISCECTOMY 1 LEVEL;  Surgeon: Charlie Pitter, MD;  Location: [place] [facility];  Service: Neurosurgery;  Laterality: Right;  Right lumbar three-four microdiscectomy  . TONSILLECTOMY    . TRACHEOSTOMY CLOSURE  1981    Family History  Problem Relation Age of Onset  . Diabetes Maternal Aunt   . Cancer Paternal Grandmother        colon cancer  . Diabetes Maternal Aunt    Social History:  reports that he quit smoking about 12 years ago. He quit after 34.00 years of use. He quit smokeless tobacco use about 10 years ago. He reports  that he drinks about 1.2 oz of alcohol per week. He reports that he does not use drugs.  Allergies:  Allergies  Allergen Reactions  . Cephalexin Anaphylaxis, Swelling and Other (See Comments)    Swelling in throat    No medications prior to admission.    No results found for this or any previous visit (from the past 48 hour(s)). No results found.  Review of Systems  Musculoskeletal: Positive for joint pain.  All other systems reviewed and are negative.   There were no vitals taken for this visit. Physical Exam  Constitutional: He appears well-developed.  HENT:  Head: Normocephalic.  Eyes: Pupils are equal, round, and reactive to light.  Neck: Normal range of motion.  Cardiovascular: Normal rate.  Respiratory: Effort normal.  Neurological: He is alert.  Skin: Skin is warm.  Psychiatric: He has a normal mood and affect.  Examination of the right shoulder demonstrates functional deltoid.  Motor sensory function to the hand is intact.  Skin is intact in the shoulder region.  Not much in the way of range of motion is performed because of concern about instability.  Assessment/Plan Impression is right shoulder instability and rotator cuff tearing following significant injury fall from height.  CT scan demonstrates 1 fragments anterior inferior glenoid with medial displacement.  This fragment has the anterior band of the glenohumeral ligament attached.  Plan is open reduction internal fixation of this  fragment along with rotator cuff repair.  Risks and benefits are discussed including but not limited to infection nerve vessel damage shoulder stiffness.  Patient understands the risk and benefits.  All questions answered.  Anderson Malta, MD 06/01/2017, 5:15 PM

## 2017-06-01 NOTE — Progress Notes (Signed)
Michael Michael Li reports that he has been off of Eliquis since Sunday, April 14. "Dr Marlou Sa told me to stop it 2 days prior to surgery, I thought it was going to be on Tuesday, I have not started it back."  I informed Dr Marcie Bal of this this and no new order were given. Patient was started on Eliquis 10/2016 when he was diagnosed with PAF, patient was to be cardioverted in 11/2016, but was found to be in  NSR, Eliquis has continues.  Michael Li states that his CBG fasting runs in the 90's.  I instructed patient to take 21 units of NPH tonight and eat a snack at hs. Hold all diabetic medications in am. I instructed patient to check CBG after awaking and every 2 hours until arrival  to the hospital.  I Instructed patient if CBG is less than 70 to take 4 Glucose Tablets   Recheck CBG in 15 minutes then call pre- op desk at [phone] for further instructions. If scheduled to receive Insulin, do n

## 2017-06-01 NOTE — Telephone Encounter (Signed)
I called.

## 2017-06-02 ENCOUNTER — Ambulatory Visit (HOSPITAL_COMMUNITY): Payer: 59 | Admitting: Anesthesiology

## 2017-06-02 ENCOUNTER — Encounter (HOSPITAL_COMMUNITY)
Admission: RE | Disposition: A | Discharge status: Home / Self Care | Payer: Self-pay | Source: Ambulatory Visit | Attending: Orthopedic Surgery

## 2017-06-02 ENCOUNTER — Encounter (HOSPITAL_COMMUNITY): Payer: Self-pay | Admitting: *Deleted

## 2017-06-02 ENCOUNTER — Ambulatory Visit (HOSPITAL_COMMUNITY)
Admission: RE | Admit: 2017-06-02 | Discharge: 2017-06-02 | Disposition: A | Discharge status: Home / Self Care | Payer: 59 | Source: Ambulatory Visit | Attending: Orthopedic Surgery | Admitting: Orthopedic Surgery

## 2017-06-02 DIAGNOSIS — S43004A Unspecified dislocation of right shoulder joint, initial encounter: Secondary | ICD-10-CM | POA: Diagnosis not present

## 2017-06-02 DIAGNOSIS — Z87891 Personal history of nicotine dependence: Secondary | ICD-10-CM | POA: Diagnosis not present

## 2017-06-02 DIAGNOSIS — Z79899 Other long term (current) drug therapy: Secondary | ICD-10-CM | POA: Insufficient documentation

## 2017-06-02 DIAGNOSIS — Z85828 Personal history of other malignant neoplasm of skin: Secondary | ICD-10-CM | POA: Diagnosis not present

## 2017-06-02 DIAGNOSIS — Z888 Allergy status to other drugs, medicaments and biological substances status: Secondary | ICD-10-CM | POA: Insufficient documentation

## 2017-06-02 DIAGNOSIS — N529 Male erectile dysfunction, unspecified: Secondary | ICD-10-CM | POA: Diagnosis not present

## 2017-06-02 DIAGNOSIS — M75101 Unspecified rotator cuff tear or rupture of right shoulder, not specified as traumatic: Secondary | ICD-10-CM | POA: Insufficient documentation

## 2017-06-02 DIAGNOSIS — Z7901 Long term (current) use of anticoagulants: Secondary | ICD-10-CM | POA: Diagnosis not present

## 2017-06-02 DIAGNOSIS — E119 Type 2 diabetes mellitus without complications: Secondary | ICD-10-CM | POA: Diagnosis not present

## 2017-06-02 DIAGNOSIS — M7521 Bicipital tendinitis, right shoulder: Secondary | ICD-10-CM | POA: Diagnosis not present

## 2017-06-02 DIAGNOSIS — S42141A Displaced fracture of glenoid cavity of scapula, right shoulder, initial encounter for closed fracture: Secondary | ICD-10-CM | POA: Insufficient documentation

## 2017-06-02 DIAGNOSIS — F909 Attention-deficit hyperactivity disorder, unspecified type: Secondary | ICD-10-CM | POA: Diagnosis not present

## 2017-06-02 DIAGNOSIS — M65811 Other synovitis and tenosynovitis, right shoulder: Secondary | ICD-10-CM | POA: Diagnosis not present

## 2017-06-02 DIAGNOSIS — B182 Chronic viral hepatitis C: Secondary | ICD-10-CM | POA: Diagnosis not present

## 2017-06-02 DIAGNOSIS — K219 Gastro-esophageal reflux disease without esophagitis: Secondary | ICD-10-CM | POA: Insufficient documentation

## 2017-06-02 DIAGNOSIS — Z794 Long term (current) use of insulin: Secondary | ICD-10-CM | POA: Diagnosis not present

## 2017-06-02 DIAGNOSIS — S46001A Unspecified injury of muscle(s) and tendon(s) of the rotator cuff of right shoulder, initial encounter: Secondary | ICD-10-CM | POA: Diagnosis not present

## 2017-06-02 DIAGNOSIS — W1789XA Other fall from one level to another, initial encounter: Secondary | ICD-10-CM | POA: Diagnosis not present

## 2017-06-02 DIAGNOSIS — S42144A Nondisplaced fracture of glenoid cavity of scapula, right shoulder, initial encounter for closed fracture: Secondary | ICD-10-CM | POA: Diagnosis not present

## 2017-06-02 HISTORY — DX: Dyspnea, unspecified: R06.00

## 2017-06-02 HISTORY — DX: Personal history of other medical treatment: Z92.89

## 2017-06-02 HISTORY — DX: Cardiac arrhythmia, unspecified: I49.9

## 2017-06-02 HISTORY — PX: ORIF SHOULDER FRACTURE: SHX5035

## 2017-06-02 LAB — BASIC METABOLIC PANEL
Anion gap: 10 (ref 5–15)
BUN: 14 mg/dL (ref 6–20)
CO2: 22 mmol/L (ref 22–32)
Calcium: 8.9 mg/dL (ref 8.9–10.3)
Chloride: 107 mmol/L (ref 101–111)
Creatinine, Ser: 0.87 mg/dL (ref 0.61–1.24)
GFR calc Af Amer: >60 mL/min (ref >60)
GFR calc non Af Amer: >60 mL/min (ref >60)
Glucose, Bld: 134 mg/dL — ABNORMAL HIGH (ref 65–99)
Potassium: 3.8 mmol/L (ref 3.5–5.1)
Sodium: 139 mmol/L (ref 135–145)

## 2017-06-02 LAB — CBC
HCT: 43.1 % (ref 39.0–52.0)
Hemoglobin: 14.9 g/dL (ref 13.0–17.0)
MCH: 30 pg (ref 26.0–34.0)
MCHC: 34.6 g/dL (ref 30.0–36.0)
MCV: 86.9 fL (ref 78.0–100.0)
Platelets: 170 10*3/uL (ref 150–400)
RBC: 4.96 MIL/uL (ref 4.22–5.81)
RDW: 12.8 % (ref 11.5–15.5)
WBC: 5.6 10*3/uL (ref 4.0–10.5)

## 2017-06-02 LAB — PROTIME-INR
INR: 1
Prothrombin Time: 13.1 seconds (ref 11.4–15.2)

## 2017-06-02 LAB — GLUCOSE, CAPILLARY
Glucose-Capillary: 131 mg/dL — ABNORMAL HIGH (ref 65–99)
Glucose-Capillary: 175 mg/dL — ABNORMAL HIGH (ref 65–99)

## 2017-06-02 LAB — HEMOGLOBIN A1C
Hgb A1c MFr Bld: 5.6 % (ref 4.8–5.6)
Mean Plasma Glucose: 114.02 mg/dL

## 2017-06-02 SURGERY — OPEN REDUCTION INTERNAL FIXATION (ORIF) SHOULDER FRACTURE
Anesthesia: Regional | Site: Shoulder | Laterality: Right

## 2017-06-02 MED ORDER — BUPIVACAINE-EPINEPHRINE (PF) 0.5% -1:200000 IJ SOLN
INTRAMUSCULAR | Status: DC | PRN
  Administered 2017-06-02: 30 mL via PERINEURAL

## 2017-06-02 MED ORDER — MIDAZOLAM HCL 5 MG/5ML IJ SOLN
INTRAMUSCULAR | Status: DC | PRN
  Administered 2017-06-02: 2 mg via INTRAVENOUS

## 2017-06-02 MED ORDER — FENTANYL CITRATE (PF) 250 MCG/5ML IJ SOLN
INTRAMUSCULAR | Status: AC
  Filled 2017-06-02: qty 5

## 2017-06-02 MED ORDER — PROPOFOL 10 MG/ML IV BOLUS
INTRAVENOUS | Status: DC | PRN
  Administered 2017-06-02: 130 mg via INTRAVENOUS

## 2017-06-02 MED ORDER — SUGAMMADEX SODIUM 200 MG/2ML IV SOLN
INTRAVENOUS | Status: DC | PRN
Start: 2017-06-02 — End: 2017-06-02
  Administered 2017-06-02: 180 mg via INTRAVENOUS

## 2017-06-02 MED ORDER — CLINDAMYCIN PHOSPHATE 900 MG/50ML IV SOLN
900.0000 mg | INTRAVENOUS | Status: AC
  Administered 2017-06-02: 900 mg via INTRAVENOUS
  Filled 2017-06-02: qty 50

## 2017-06-02 MED ORDER — 0.9 % SODIUM CHLORIDE (POUR BTL) OPTIME
TOPICAL | Status: DC | PRN
  Administered 2017-06-02 (×3): 1000 mL
  Administered 2017-06-02: 700 mL

## 2017-06-02 MED ORDER — ROCURONIUM BROMIDE 100 MG/10ML IV SOLN
INTRAVENOUS | Status: DC | PRN
  Administered 2017-06-02: 25 mg via INTRAVENOUS
  Administered 2017-06-02: 50 mg via INTRAVENOUS
  Administered 2017-06-02: 25 mg via INTRAVENOUS

## 2017-06-02 MED ORDER — ONDANSETRON HCL 4 MG/2ML IJ SOLN
INTRAMUSCULAR | Status: DC | PRN
  Administered 2017-06-02: 4 mg via INTRAVENOUS

## 2017-06-02 MED ORDER — FENTANYL CITRATE (PF) 250 MCG/5ML IJ SOLN
INTRAMUSCULAR | Status: DC | PRN
  Administered 2017-06-02: 100 ug via INTRAVENOUS
  Administered 2017-06-02: 50 ug via INTRAVENOUS
  Administered 2017-06-02: 100 ug via INTRAVENOUS

## 2017-06-02 MED ORDER — DEXAMETHASONE SODIUM PHOSPHATE 10 MG/ML IJ SOLN
INTRAMUSCULAR | Status: DC | PRN
  Administered 2017-06-02: 10 mg via INTRAVENOUS

## 2017-06-02 MED ORDER — LIDOCAINE HCL (CARDIAC) PF 100 MG/5ML IV SOSY
PREFILLED_SYRINGE | INTRAVENOUS | Status: DC | PRN
  Administered 2017-06-02: 60 mg via INTRATRACHEAL

## 2017-06-02 MED ORDER — PROPOFOL 10 MG/ML IV BOLUS
INTRAVENOUS | Status: AC
  Filled 2017-06-02: qty 20

## 2017-06-02 MED ORDER — FENTANYL CITRATE (PF) 100 MCG/2ML IJ SOLN: 25.0000 ug | INTRAMUSCULAR | Status: DC | PRN

## 2017-06-02 MED ORDER — LACTATED RINGERS IV SOLN
INTRAVENOUS | Status: DC | PRN
  Administered 2017-06-02 (×2): via INTRAVENOUS

## 2017-06-02 MED ORDER — PHENYLEPHRINE HCL 10 MG/ML IJ SOLN
INTRAVENOUS | Status: DC | PRN
  Administered 2017-06-02: 25 ug/min via INTRAVENOUS

## 2017-06-02 MED ORDER — MIDAZOLAM HCL 2 MG/2ML IJ SOLN
INTRAMUSCULAR | Status: AC
  Filled 2017-06-02: qty 2

## 2017-06-02 SURGICAL SUPPLY — 81 items
AID PSTN UNV HD RSTRNT DISP (MISCELLANEOUS) ×1
ANCH SUT 2 BSUT TK 12.4 PEEK (SUTURE) ×2
ANCH SUT 2 CRKSRW FT 14X4.5 (Anchor) ×1 IMPLANT
ANCH SUT CRKSW FT 1.3X (Anchor) ×3 IMPLANT
ANCH SUT PUSHLCK 24X4.5 STRL (Orthopedic Implant) ×3 IMPLANT
ANCH SUT SHRT 12.5 CANN EYLT (Anchor) ×1 IMPLANT
ANCHOR PEEK CORKSCREW 4.5 (Anchor) ×1 IMPLANT
ANCHOR SUT BIOCOMP CORKSREW (Anchor) ×3 IMPLANT
ANCHOR SUT BIOCOMP LK 2.9X12.5 (Anchor) ×1 IMPLANT
ANCHOR SUT PEEK 3.0 ST 3 (SUTURE) ×2 IMPLANT
CANISTER SUCT 3000ML (MISCELLANEOUS) ×1 IMPLANT
COVER SURGICAL LIGHT HANDLE (MISCELLANEOUS) ×2 IMPLANT
DRAPE C-ARM 42X72 X-RAY (DRAPES) ×2 IMPLANT
DRAPE INCISE IOBAN 66X45 STRL (DRAPES) ×2 IMPLANT
DRAPE ORTHO SPLIT 77X108 STRL (DRAPES) ×4
DRAPE POUCH INSTRU U-SHP 10X18 (DRAPES) ×2 IMPLANT
DRAPE SURG 17X23 STRL (DRAPES) ×2 IMPLANT
DRAPE SURG ORHT 6 SPLT 77X108 (DRAPES) ×2 IMPLANT
DRAPE U-SHAPE 47X51 STRL (DRAPES) ×2 IMPLANT
DRSG AQUACEL AG ADV 3.5X10 (GAUZE/BANDAGES/DRESSINGS) ×1 IMPLANT
DRSG EMULSION OIL 3X3 NADH (GAUZE/BANDAGES/DRESSINGS) ×2 IMPLANT
DRSG PAD ABDOMINAL 8X10 ST (GAUZE/BANDAGES/DRESSINGS) ×2 IMPLANT
DURAPREP 26ML APPLICATOR (WOUND CARE) ×3 IMPLANT
ELECT BLADE 4.0 EZ CLEAN MEGAD (MISCELLANEOUS) ×2
ELECT REM PT RETURN 9FT ADLT (ELECTROSURGICAL) ×2
ELECTRODE BLDE 4.0 EZ CLN MEGD (MISCELLANEOUS) ×1 IMPLANT
ELECTRODE REM PT RTRN 9FT ADLT (ELECTROSURGICAL) ×1 IMPLANT
GAUZE SPONGE 4X4 12PLY STRL (GAUZE/BANDAGES/DRESSINGS) ×2 IMPLANT
GLOVE BIO SURGEON STRL SZ7.5 (GLOVE) ×2 IMPLANT
GLOVE BIO SURGEON STRL SZ8 (GLOVE) ×2 IMPLANT
GLOVE EUDERMIC 7 POWDERFREE (GLOVE) ×2 IMPLANT
GLOVE SS BIOGEL STRL SZ 7.5 (GLOVE) ×1 IMPLANT
GLOVE SUPERSENSE BIOGEL SZ 7.5 (GLOVE) ×1
GOWN STRL REUS W/ TWL LRG LVL3 (GOWN DISPOSABLE) ×1 IMPLANT
GOWN STRL REUS W/ TWL XL LVL3 (GOWN DISPOSABLE) ×2 IMPLANT
GOWN STRL REUS W/TWL LRG LVL3 (GOWN DISPOSABLE) ×2
GOWN STRL REUS W/TWL XL LVL3 (GOWN DISPOSABLE) ×4
GUIDEWIRE .062X12IN LONG (WIRE) ×2 IMPLANT
KIT BASIN OR (CUSTOM PROCEDURE TRAY) ×2 IMPLANT
KIT PERC INSERT 3.0 KNTLS (KITS) ×1 IMPLANT
KIT PUSHLOCK 2.9 HIP (KITS) ×1 IMPLANT
KIT TURNOVER KIT B (KITS) ×2 IMPLANT
MARKER SKIN DUAL TIP RULER LAB (MISCELLANEOUS) ×2 IMPLANT
NDL SUT 6 .5 CRC .975X.05 MAYO (NEEDLE) ×1 IMPLANT
NEEDLE 22X1 1/2 (OR ONLY) (NEEDLE) IMPLANT
NEEDLE MAYO TAPER (NEEDLE) ×2
NS IRRIG 1000ML POUR BTL (IV SOLUTION) ×2 IMPLANT
PACK SHOULDER (CUSTOM PROCEDURE TRAY) ×2 IMPLANT
PAD ARMBOARD 7.5X6 YLW CONV (MISCELLANEOUS) ×4 IMPLANT
PUSHLOCK PEEK 4.5X24 (Orthopedic Implant) ×3 IMPLANT
RESTRAINT HEAD UNIVERSAL NS (MISCELLANEOUS) ×2 IMPLANT
SCREW CANN PT 3.75X32 (Screw) ×2 IMPLANT
SLING ARM IMMOBILIZER LRG (SOFTGOODS) ×2 IMPLANT
SPONGE LAP 4X18 X RAY DECT (DISPOSABLE) ×4 IMPLANT
STRIP CLOSURE SKIN 1/2X4 (GAUZE/BANDAGES/DRESSINGS) ×3 IMPLANT
SUCTION FRAZIER HANDLE 10FR (MISCELLANEOUS) ×1
SUCTION TUBE FRAZIER 10FR DISP (MISCELLANEOUS) ×1 IMPLANT
SUT BONE WAX W31G (SUTURE) IMPLANT
SUT ETHIBOND NAB CT1 #1 30IN (SUTURE) ×4 IMPLANT
SUT FIBERWIRE #2 38 T-5 BLUE (SUTURE) ×4
SUT FIBERWIRE 2-0 18 17.9 3/8 (SUTURE) ×2
SUT MNCRL AB 3-0 PS2 18 (SUTURE) ×2 IMPLANT
SUT MNCRL AB 3-0 PS2 27 (SUTURE) ×2 IMPLANT
SUT MON AB 2-0 CT1 27 (SUTURE) ×2 IMPLANT
SUT SILK 2 0 (SUTURE) ×2
SUT SILK 2-0 18XBRD TIE 12 (SUTURE) IMPLANT
SUT VIC AB 0 CT1 27 (SUTURE) ×6
SUT VIC AB 0 CT1 27XBRD ANBCTR (SUTURE) ×1 IMPLANT
SUT VIC AB 1 CT1 27 (SUTURE) ×4
SUT VIC AB 1 CT1 27XBRD ANBCTR (SUTURE) ×1 IMPLANT
SUT VIC AB 2-0 CT1 27 (SUTURE) ×10
SUT VIC AB 2-0 CT1 TAPERPNT 27 (SUTURE) ×2 IMPLANT
SUT VICRYL 0 UR6 27IN ABS (SUTURE) ×8 IMPLANT
SUT VICRYL 4-0 PS2 18IN ABS (SUTURE) ×2 IMPLANT
SUTURE FIBERWR #2 38 T-5 BLUE (SUTURE) ×2 IMPLANT
SUTURE FIBERWR 2-0 18 17.9 3/8 (SUTURE) IMPLANT
SYR CONTROL 10ML LL (SYRINGE) ×2 IMPLANT
TOWEL OR 17X24 6PK STRL BLUE (TOWEL DISPOSABLE) ×2 IMPLANT
TOWEL OR 17X26 10 PK STRL BLUE (TOWEL DISPOSABLE) ×2 IMPLANT
WATER STERILE IRR 1000ML POUR (IV SOLUTION) ×1 IMPLANT
YANKAUER SUCT BULB TIP NO VENT (SUCTIONS) ×1 IMPLANT

## 2017-06-02 NOTE — Anesthesia Procedure Notes (Signed)
Anesthesia Regional Block: Interscalene brachial plexus block   Pre-Anesthetic Checklist: ,, timeout performed, Correct Patient, Correct Site, Correct Laterality, Correct Procedure, Correct Position, site marked, Risks and benefits discussed,  Surgical consent,  Pre-op evaluation,  At surgeon's request and post-op pain management  Laterality: Upper and Right  Prep: chloraprep       Needles:  Injection technique: Single-shot  Needle Type: Echogenic Stimulator Needle          Additional Needles:   Procedures:,,,, ultrasound used (permanent image in chart),,,,  Narrative:  Start time: 06/02/2017 7:36 AM End time: 06/02/2017 7:41 AM Injection made incrementally with aspirations every 5 mL.  Performed by: Personally  Anesthesiologist: Oleta Mouse, MD  Additional Notes: H+P and labs reviewed, risks and benefits discussed with patient, procedure tolerated well without complications

## 2017-06-02 NOTE — Anesthesia Postprocedure Evaluation (Deleted)
Anesthesia Post Note  Patient: Michael Li  Procedure(s) Performed: OPEN REDUCTION INTERNAL FIXATION (ORIF) RIGHT GLENOID FRACTURE, POSSIBLE LATARJET CORACOID TRANSFER (Right Shoulder)     Patient location during evaluation: PACU Anesthesia Type: General Level of consciousness: awake and alert Pain management: pain level controlled Vital Signs Assessment: post-procedure vital signs reviewed and stable Respiratory status: spontaneous breathing, nonlabored ventilation, respiratory function stable and patient connected to nasal cannula oxygen Cardiovascular status: blood pressure returned to baseline and stable Postop Assessment: no apparent nausea or vomiting Anesthetic complications: no    Last Vitals:  Vitals:   06/02/17 1125 06/02/17 1221  BP:  (!) 145/77  Pulse:  81  Resp:  19  Temp: 36.5 C 36.6 C  SpO2:  94%    Last Pain:  Vitals:   06/02/17 1221  TempSrc:   PainSc: 0-No pain                 Xzavior Reinig

## 2017-06-02 NOTE — Op Note (Signed)
NAME:  CAM, DAUPHIN                    ACCOUNT NO.:  MEDICAL RECORD NO.:  [id number]  LOCATION:                                 FACILITY:  PHYSICIAN:  Anderson Malta, M.D.         DATE OF BIRTH:  DATE OF PROCEDURE: DATE OF DISCHARGE:                              OPERATIVE REPORT   PREOPERATIVE DIAGNOSES:  Right shoulder anterior-inferior glenoid fracture and supraspinatus rotator cuff tear.  POSTOPERATIVE DIAGNOSES:  Right shoulder anterior-inferior glenoid fracture plus rotator cuff tear, supraspinatus and significant and severe biceps tendinitis.  PROCEDURES:  Right shoulder open reduction and internal fixation of glenoid fracture, open rotator cuff repair and biceps tenodesis.  SURGEON:  Anderson Malta, M.D.  ASSISTANT:  Dyke Brackett, RNFA.  INDICATIONS:  Gustavo was a 62 year old patient with right shoulder pain. He presents for operative management after explanation of risks and benefits.  The patient fell 3 weeks ago and sustained a glenoid fracture with subsequent shoulder instability and rotator cuff tearing by MRI scan.  The patient understands the risks and benefits.  PROCEDURE IN DETAIL:  The patient was brought to the operating room where general anesthetic was induced.  Preoperative antibiotics were administered.  Time-out was called.  Right shoulder was examined under anesthesia and found to have anterior and inferior laxity and instability.  The right shoulder, arm, and hand were prescrubbed with alcohol and Betadine, allowed to air dry, prepped with DuraPrep solution and draped in a sterile manner.  Charlie Pitter was used to cover the operative field.  Deltopectoral approach was made.  Skin and subcutaneous tissue were sharply divided.  Cephalic vein was mobilized medially.  The vein did sustain an avulsion of one of its branches and the vein was ligated. Subacromial and subdeltoid space was freed up of adhesions.  Partial release of the distal deltoid was made.  At  this time, the biceps tendon sheath below the transverse humeral ligament was opened and the biceps tendon itself had significant tenosynovitis and degeneration.  The rotator interval was subsequently opened and the biceps tendon itself was severely degenerated.  At this time, the axillary nerve was palpated and visualized and protected at all times during the case.  The rotator interval was opened.  The supraspinatus tear, which was about 1 x 1 cm, was visualized.  The subscapularis was then peeled off the lesser tuberosity for later attachment.  Capsule also detached from the glenoid neck down to about the 6 o'clock position for visualization.  Rotator interval released to the base of the coracoid.  Coracoid humeral ligament also released.  This enhanced visualization.  Fukuda retractor was placed to retract the humeral head posteriorly.  Anterior inferior glenoid rim fracture was present.  This was basically inferior and medial.  The fracture was mobilized with an osteotome.  Following fracture immobilization, 2 stay sutures were placed in the labral complex and the fracture was mobilized up to its original position. Held in position and then 2 Arthrex 32-mm ladder-J screws were placed. They were placed in such a manner that the screw heads did not come into contact with the articular surface.  After drilling the  guide pins, the length was obtained by feeling the cortical perforation on the far side and measuring off the pin.  Approximate measurement of 32 mm gave good fixation.  Two screws were placed and good fragment fixation was achieved.  At this time, attention was directed towards the rotator cuff tear.  One Arthrex 5.5 corkscrew and one PushLock were utilized.  Four suture strands of FiberTape were passed through the rotator cuff tear, which was passed down to a PushLock.  The prepared bed of the tear on the footprint filled in nicely with the tear.  At this time, attention was  directed towards reattachment of both the capsule and the subscap. One 3.0-mm anchor was placed at the inferior aspect of the humeral neck. The inferior capsule was then reattached to the inferior humeral neck. In similar manner, one further 4.5 PEEK anchor was placed with 2 limbs and that allowed for reattachment and re-establishment of the anterior- inferior glenohumeral sling.  This capsule repair was very solid.  The subscap was then reattached to the lesser tuberosity by using two 5.5 corkscrew anchors.  The tapes were then placed in an angled fashion parallel to the face of the humeral neck.  These sutures were tied and 4 strands were placed in one PushLock superior within the bicipital groove, 4 strands placed in one PushLock inferior within the bicipital groove.  All in all, very solid repair was achieved.  The biceps tendon was also tenodesed and released from its superior attachment.  It was tenodesed to the pec tendon using multiple 0 Vicryl sutures.  Following this, thorough irrigation was performed within the joint as well as outside of the joint.  Rotator interval was closed in external rotation. This was done with 0 Vicryl suture.  Deltopectoral split was then closed using #1 Vicryl suture followed by interrupted inverted 0 Vicryl suture, 2-0 Vicryl suture, and 3-0 Monocryl.  Aquacel dressing placed along with a shoulder immobilizer.  The patient tolerated the procedure well without immediate complication, transferred to the recovery room in stable condition.  Shoulder stability markedly improved following fixation of the fracture and reestablishment of the normal tissue tension.     Anderson Malta, M.D.     GSD/MEDQ  D:  06/02/2017  T:  06/02/2017  Job:  [id number]

## 2017-06-02 NOTE — Anesthesia Procedure Notes (Signed)
Procedure Name: Intubation Date/Time: 06/02/2017 7:59 AM Performed by: Mariea Clonts, CRNA Pre-anesthesia Checklist: Patient identified, Emergency Drugs available, Suction available and Patient being monitored Patient Re-evaluated:Patient Re-evaluated prior to induction Oxygen Delivery Method: Circle System Utilized Preoxygenation: Pre-oxygenation with 100% oxygen Induction Type: IV induction Ventilation: Mask ventilation without difficulty Laryngoscope Size: Miller and 2 Grade View: Grade I Tube type: Oral Tube size: 7.5 mm Number of attempts: 1 Airway Equipment and Method: Stylet and Oral airway Placement Confirmation: ETT inserted through vocal cords under direct vision,  positive ETCO2 and breath sounds checked- equal and bilateral Tube secured with: Tape Dental Injury: Teeth and Oropharynx as per pre-operative assessment

## 2017-06-02 NOTE — Anesthesia Preprocedure Evaluation (Signed)
Anesthesia Evaluation  Patient identified by MRN, date of birth, ID band Patient awake    Reviewed: Allergy & Precautions, H&P , NPO status , Patient's Chart, lab work & pertinent test results  History of Anesthesia Complications Negative for: history of anesthetic complications  Airway Mallampati: II  TM Distance: >3 FB Neck ROM: Full    Dental  (+) Teeth Intact, Dental Advisory Given   Pulmonary former smoker,    breath sounds clear to auscultation       Cardiovascular negative cardio ROS   Rhythm:Regular Rate:Normal     Neuro/Psych    GI/Hepatic GERD  Medicated and Controlled,(+) Hepatitis -, C  Endo/Other  diabetes, Well Controlled, Type 2, Insulin Dependent  Renal/GU      Musculoskeletal   Abdominal   Peds  Hematology   Anesthesia Other Findings   Reproductive/Obstetrics                             Anesthesia Physical Anesthesia Plan  ASA: III  Anesthesia Plan: General and Regional   Post-op Pain Management:  Regional for Post-op pain   Induction:   PONV Risk Score and Plan: 2 and Dexamethasone and Ondansetron  Airway Management Planned: Oral ETT  Additional Equipment: None  Intra-op Plan:   Post-operative Plan: Extubation in OR  Informed Consent: I have reviewed the patients History and Physical, chart, labs and discussed the procedure including the risks, benefits and alternatives for the proposed anesthesia with the patient or authorized representative who has indicated his/her understanding and acceptance.   Dental advisory given  Plan Discussed with: CRNA and Surgeon  Anesthesia Plan Comments:         Anesthesia Quick Evaluation

## 2017-06-02 NOTE — Transfer of Care (Signed)
Immediate Anesthesia Transfer of Care Note  Patient: Michael Li  Procedure(s) Performed: OPEN REDUCTION INTERNAL FIXATION (ORIF) RIGHT GLENOID FRACTURE, POSSIBLE LATARJET CORACOID TRANSFER (Right Shoulder)  Patient Location: PACU  Anesthesia Type:General  Level of Consciousness: awake, alert  and oriented  Airway & Oxygen Therapy: Patient Spontanous Breathing and Patient connected to nasal cannula oxygen  Post-op Assessment: Report given to RN and Post -op Vital signs reviewed and stable  Post vital signs: Reviewed and stable  Last Vitals:  Vitals Value Taken Time  BP 110/70 06/02/2017 11:24 AM  Temp    Pulse 81 06/02/2017 11:25 AM  Resp 21 06/02/2017 11:25 AM  SpO2 96 % 06/02/2017 11:25 AM  Vitals shown include unvalidated device data.  Last Pain:  Vitals:   06/02/17 0606  TempSrc: Oral      Patients Stated Pain Goal: 1 (16/10/96 0454)  Complications: No apparent anesthesia complications

## 2017-06-02 NOTE — Brief Op Note (Signed)
06/02/2017  11:39 AM  PATIENT:  Michael Li  61 y.o. male  PRE-OPERATIVE DIAGNOSIS:  Right Glenoid Fracture,rotator cuff tear  POST-OPERATIVE DIAGNOSIS:  Right Glenoid Fracture rotator cuff tear and biceps tendonitis  PROCEDURE:  Procedure(s): OPEN REDUCTION INTERNAL FIXATION (ORIF) RIGHT GLENOID FRACTURE, rotator cuff tear repair and biceps tenodesis  SURGEON:  Surgeon(s): Marlou Sa, Tonna Corner, MD  ASSISTANT: Ky Barban rnfa  ANESTHESIA:   general  EBL: 100 ml    Total I/O In: 1400 [I.V.:1400] Out: 1750 [Urine:1600; Blood:150]  BLOOD ADMINISTERED: none  DRAINS: none   LOCAL MEDICATIONS USED:  none  SPECIMEN:  No Specimen  COUNTS:  YES  TOURNIQUET:  * No tourniquets in log *  DICTATION: .Other Dictation: Dictation Number [id number]  PLAN OF CARE: Discharge to home after PACU  PATIENT DISPOSITION:  PACU - hemodynamically stable

## 2017-06-02 NOTE — Anesthesia Postprocedure Evaluation (Signed)
Anesthesia Post Note  Patient: KIMBALL APPLEBY  Procedure(s) Performed: OPEN REDUCTION INTERNAL FIXATION (ORIF) RIGHT GLENOID FRACTURE, POSSIBLE LATARJET CORACOID TRANSFER (Right Shoulder)     Patient location during evaluation: PACU Anesthesia Type: Regional and General Level of consciousness: awake and alert Pain management: pain level controlled Vital Signs Assessment: post-procedure vital signs reviewed and stable Respiratory status: spontaneous breathing, nonlabored ventilation, respiratory function stable and patient connected to nasal cannula oxygen Cardiovascular status: blood pressure returned to baseline and stable Postop Assessment: no apparent nausea or vomiting Anesthetic complications: no    Last Vitals:  Vitals:   06/02/17 1125 06/02/17 1221  BP:  (!) 145/77  Pulse:  81  Resp:  19  Temp: 36.5 C 36.6 C  SpO2:  94%    Last Pain:  Vitals:   06/02/17 1221  TempSrc:   PainSc: 0-No pain                 Kirsti Mcalpine

## 2017-06-03 ENCOUNTER — Other Ambulatory Visit: Payer: Self-pay | Admitting: Internal Medicine

## 2017-06-06 ENCOUNTER — Encounter (HOSPITAL_COMMUNITY): Payer: Self-pay | Admitting: Orthopedic Surgery

## 2017-06-12 ENCOUNTER — Ambulatory Visit (INDEPENDENT_AMBULATORY_CARE_PROVIDER_SITE_OTHER): Payer: 59 | Admitting: Orthopedic Surgery

## 2017-06-12 ENCOUNTER — Encounter (INDEPENDENT_AMBULATORY_CARE_PROVIDER_SITE_OTHER): Payer: Self-pay | Admitting: Orthopedic Surgery

## 2017-06-12 ENCOUNTER — Ambulatory Visit (INDEPENDENT_AMBULATORY_CARE_PROVIDER_SITE_OTHER): Payer: 59

## 2017-06-12 DIAGNOSIS — M25511 Pain in right shoulder: Secondary | ICD-10-CM

## 2017-06-12 DIAGNOSIS — S42151A Displaced fracture of neck of scapula, right shoulder, initial encounter for closed fracture: Secondary | ICD-10-CM

## 2017-06-12 DIAGNOSIS — S42141A Displaced fracture of glenoid cavity of scapula, right shoulder, initial encounter for closed fracture: Secondary | ICD-10-CM

## 2017-06-12 MED ORDER — OXYCODONE HCL 5 MG PO TABS: ORAL_TABLET | ORAL | 0 refills | Status: DC

## 2017-06-12 NOTE — Progress Notes (Signed)
Post-Op Visit Note   Patient: Michael Li           Date of Birth: 1955-10-17           MRN: [id number] Visit Date: 06/12/2017 PCP: Marletta Lor, MD   Assessment & Plan:  Chief Complaint:  Chief Complaint  Patient presents with  . Right Shoulder - Routine Post Op   Visit Diagnoses:  1. Right shoulder pain, unspecified chronicity   2. Glenoid fracture of shoulder, right, closed, initial encounter     Plan: Michael Li is a 62 year old patient who is now about 10 days out right glenoid fracture fixation.  Also had rotator cuff tear and biceps tenodesis performed.  On examination deltoid fires.  Shoulder is predictably stiff.  Radiographs look good.  Plan is at the beginning of next week to start doing pendulum exercises only.  Stay in the sling this week.  2-week return for clinical recheck and I will likely take him out of the sling at that time.  We will need repeat x-rays on return just to make sure that there is no change in fracture alignment.  I do not want him really staying out of the sling until I see him back.  Okay to be out of the sling when seated but I do not want the weight of the arm to be pulling on that fracture yet.  Follow-Up Instructions: Return in about 2 weeks (around 06/26/2017).   Orders:  Orders Placed This Encounter  Procedures  . XR Shoulder Right   Meds ordered this encounter  Medications  . oxyCODONE (ROXICODONE) 5 MG immediate release tablet    Sig: 1 po q 8hrs prn pain    Dispense:  30 tablet    Refill:  0    Imaging: Xr Shoulder Right  Result Date: 06/12/2017 AP outlet right shoulder reviewed.  2 screws transfixing anterior inferior glenoid fracture is seen.  There is no fracture or dislocation present.  No evidence of hardware complication.  Shoulder is reduced   PMFS History: Patient Active Problem List   Diagnosis Date Noted  . Diabetes mellitus due to underlying condition, controlled ([place]) 12/26/2016  . Type 2 diabetes  mellitus without complications ([place]) 46/56/8127  . Atrial fibrillation ([place]) 10/25/2016  . Acute medial meniscal tear 02/05/2016  . HNP (herniated nucleus pulposus), lumbar 09/12/2012  . 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 HEPATITIS C, CHRONIC 04/03/2007  . ERECTILE DYSFUNCTION 07/19/2006  . ADHD 07/19/2006  . GERD 07/19/2006  . BENIGN PROSTATIC HYPERTROPHY 07/19/2006   Past Medical History:  Diagnosis Date  . Anemia    rec'd 32 units of blood, post T&A, 1981  . ATTENTION DEFICIT HYPERACTIVITY DISORDER 07/19/2006  . BENIGN PROSTATIC HYPERTROPHY 07/19/2006  . DIABETES MELLITUS, TYPE II 07/19/2006  . Dyspnea    had shortness of breath when heart was irregular  . Dysrhythmia    history of a-fib  . ERECTILE DYSFUNCTION 07/19/2006  . GERD 07/19/2006  . HEPATITIS C, CHRONIC 04/03/2007   Treated with Harboni   . History of blood transfusion   . NEOPLASM, MALIGNANT, CARCINOMA, BASAL CELL, NOSE 10/20/2009   Basal cell    Family History  Problem Relation Age of Onset  . Diabetes Maternal Aunt   . Cancer Paternal Grandmother        colon cancer  . Diabetes Maternal Aunt     Past Surgical History:  Procedure Laterality Date  . APPENDECTOMY    .  carotid injury       post tonsillectomy, R carotid injury with trach  . HERNIA REPAIR  1973   inguinal- R side  . LUMBAR LAMINECTOMY/DECOMPRESSION MICRODISCECTOMY Right 09/12/2012   Procedure: LUMBAR LAMINECTOMY/DECOMPRESSION MICRODISCECTOMY 1 LEVEL;  Surgeon: Charlie Pitter, MD;  Location: [facility];  Service: Neurosurgery;  Laterality: Right;  Right lumbar three-four microdiscectomy  . ORIF SHOULDER FRACTURE Right 06/02/2017   Procedure: OPEN REDUCTION INTERNAL FIXATION (ORIF) RIGHT GLENOID FRACTURE, POSSIBLE LATARJET CORACOID TRANSFER;  Surgeon: Meredith Pel, MD;  Location: [facility];  Service: Orthopedics;  Laterality: Right;  . TONSILLECTOMY    . [place]   Social History   Occupational History  .  Occupation: Best boy: [employer]  Tobacco Use  . Smoking status: Former Smoker    Years: 34.00    Last attempt to quit: 2007    Years since quitting: 12.3  . Smokeless tobacco: Former Systems developer    Quit date: 02/15/2007  Substance and Sexual Activity  . Alcohol use: Yes    Alcohol/week: 1.2 oz    Types: 2 Glasses of wine per week  . Drug use: No  . Sexual activity: Not on file

## 2017-06-26 ENCOUNTER — Ambulatory Visit (INDEPENDENT_AMBULATORY_CARE_PROVIDER_SITE_OTHER): Payer: 59

## 2017-06-26 ENCOUNTER — Encounter (INDEPENDENT_AMBULATORY_CARE_PROVIDER_SITE_OTHER): Payer: Self-pay | Admitting: Orthopedic Surgery

## 2017-06-26 ENCOUNTER — Ambulatory Visit (INDEPENDENT_AMBULATORY_CARE_PROVIDER_SITE_OTHER): Payer: Self-pay

## 2017-06-26 ENCOUNTER — Ambulatory Visit (INDEPENDENT_AMBULATORY_CARE_PROVIDER_SITE_OTHER): Payer: 59 | Admitting: Orthopedic Surgery

## 2017-06-26 DIAGNOSIS — S42151A Displaced fracture of neck of scapula, right shoulder, initial encounter for closed fracture: Secondary | ICD-10-CM | POA: Diagnosis not present

## 2017-06-26 DIAGNOSIS — S42141A Displaced fracture of glenoid cavity of scapula, right shoulder, initial encounter for closed fracture: Secondary | ICD-10-CM | POA: Diagnosis not present

## 2017-06-26 NOTE — Progress Notes (Signed)
Post-Op Visit Note   Patient: Michael Li           Date of Birth: 1955-10-06           MRN: [id number] Visit Date: 06/26/2017 PCP: Marletta Lor, MD   Assessment & Plan:  Chief Complaint:  Chief Complaint  Patient presents with  . Right Shoulder - Routine Post Op   Visit Diagnoses:  1. Glenoid fracture of shoulder, right, closed, initial encounter     Plan: Frederik is a patient with right glenoid fracture fixation.  He is doing well.  On examination deltoid fires.  Has fairly reasonable passive range of motion with no grinding or crepitus.  Radiographs look good today in terms of no further fracture displacement.  I will have him stay in the sling for 2 more weeks.  Continue with pendulum exercises only.  Start physical therapy in 3 weeks time.  Come back in 3 weeks just for clinical recheck.  I do not want this fracture to displace her for the hardware to come out.  Follow-Up Instructions: Return in about 3 weeks (around 07/17/2017).   Orders:  Orders Placed This Encounter  Procedures  . XR Shoulder Right   No orders of the defined types were placed in this encounter.   Imaging: No results found.  PMFS History: Patient Active Problem List   Diagnosis Date Noted  . Diabetes mellitus due to underlying condition, controlled ([place]) 12/26/2016  . Type 2 diabetes mellitus without complications ([place]) 74/09/1446  . Atrial fibrillation ([place]) 10/25/2016  . Acute medial meniscal tear 02/05/2016  . HNP (herniated nucleus pulposus), lumbar 09/12/2012  . 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 HEPATITIS C, CHRONIC 04/03/2007  . ERECTILE DYSFUNCTION 07/19/2006  . ADHD 07/19/2006  . GERD 07/19/2006  . BENIGN PROSTATIC HYPERTROPHY 07/19/2006   Past Medical History:  Diagnosis Date  . Anemia    rec'd 32 units of blood, post T&A, 1981  . ATTENTION DEFICIT HYPERACTIVITY DISORDER 07/19/2006  . BENIGN PROSTATIC HYPERTROPHY 07/19/2006  . DIABETES MELLITUS,  TYPE II 07/19/2006  . Dyspnea    had shortness of breath when heart was irregular  . Dysrhythmia    history of a-fib  . ERECTILE DYSFUNCTION 07/19/2006  . GERD 07/19/2006  . HEPATITIS C, CHRONIC 04/03/2007   Treated with Harboni   . History of blood transfusion   . NEOPLASM, MALIGNANT, CARCINOMA, BASAL CELL, NOSE 10/20/2009   Basal cell    Family History  Problem Relation Age of Onset  . Diabetes Maternal Aunt   . Cancer Paternal Grandmother        colon cancer  . Diabetes Maternal Aunt     Past Surgical History:  Procedure Laterality Date  . APPENDECTOMY    . carotid injury       post tonsillectomy, R carotid injury with trach  . HERNIA REPAIR  1973   inguinal- R side  . LUMBAR LAMINECTOMY/DECOMPRESSION MICRODISCECTOMY Right 09/12/2012   Procedure: LUMBAR LAMINECTOMY/DECOMPRESSION MICRODISCECTOMY 1 LEVEL;  Surgeon: Charlie Pitter, MD;  Location: [facility];  Service: Neurosurgery;  Laterality: Right;  Right lumbar three-four microdiscectomy  . ORIF SHOULDER FRACTURE Right 06/02/2017   Procedure: OPEN REDUCTION INTERNAL FIXATION (ORIF) RIGHT GLENOID FRACTURE, POSSIBLE LATARJET CORACOID TRANSFER;  Surgeon: Meredith Pel, MD;  Location: [facility];  Service: Orthopedics;  Laterality: Right;  . TONSILLECTOMY    . [place]   Social History   Occupational History  .  Occupation: Best boy: [employer]  Tobacco Use  . Smoking status: Former Smoker    Years: 34.00    Last attempt to quit: 2007    Years since quitting: 12.3  . Smokeless tobacco: Former Systems developer    Quit date: 02/15/2007  Substance and Sexual Activity  . Alcohol use: Yes    Alcohol/week: 1.2 oz    Types: 2 Glasses of wine per week  . Drug use: No  . Sexual activity: Not on file   [place] outlet axillary view

## 2017-06-29 ENCOUNTER — Other Ambulatory Visit: Payer: Self-pay | Admitting: Internal Medicine

## 2017-06-30 ENCOUNTER — Other Ambulatory Visit: Payer: Self-pay | Admitting: Internal Medicine

## 2017-07-04 ENCOUNTER — Other Ambulatory Visit: Payer: Self-pay | Admitting: Internal Medicine

## 2017-07-14 ENCOUNTER — Ambulatory Visit (INDEPENDENT_AMBULATORY_CARE_PROVIDER_SITE_OTHER): Payer: 59 | Admitting: Orthopedic Surgery

## 2017-07-14 ENCOUNTER — Encounter (INDEPENDENT_AMBULATORY_CARE_PROVIDER_SITE_OTHER): Payer: Self-pay | Admitting: Orthopedic Surgery

## 2017-07-14 DIAGNOSIS — S42151A Displaced fracture of neck of scapula, right shoulder, initial encounter for closed fracture: Secondary | ICD-10-CM

## 2017-07-14 DIAGNOSIS — S42141A Displaced fracture of glenoid cavity of scapula, right shoulder, initial encounter for closed fracture: Secondary | ICD-10-CM

## 2017-07-15 ENCOUNTER — Encounter (INDEPENDENT_AMBULATORY_CARE_PROVIDER_SITE_OTHER): Payer: Self-pay | Admitting: Orthopedic Surgery

## 2017-07-15 NOTE — Progress Notes (Signed)
Post-Op Visit Note   Patient: Michael Li           Date of Birth: Feb 17, 1955           MRN: [id number] Visit Date: 07/14/2017 PCP: Marletta Lor, MD   Assessment & Plan:  Chief Complaint:  Chief Complaint  Patient presents with  . Right Shoulder - Routine Post Op   Visit Diagnoses:  1. Glenoid fracture of shoulder, right, closed, initial encounter     Plan: Worth is a patient who is now 6 weeks out annoyed fracture fixation.  He is been doing well.  Not started physical therapy yet.  He can return to work on Monday.  The nation he has proving range of motion.  Subscap strength is a little weak the right compared to the left but the shoulder feels very stable.  The supraspinatus rotator cuff tear fixation feels good with strength and range of motion.  Plan is to start physical therapy next week for range of motion and strengthening.  Copy of the op note is sent.  I cautioned him against doing any lifting away from his body because we need the subscapularis and supraspinatus rotator cuff to heal.  Follow-Up Instructions: Return in about 6 weeks (around 08/25/2017).   Orders:  No orders of the defined types were placed in this encounter.  No orders of the defined types were placed in this encounter.   Imaging: No results found.  PMFS History: Patient Active Problem List   Diagnosis Date Noted  . Diabetes mellitus due to underlying condition, controlled ([place]) 12/26/2016  . Type 2 diabetes mellitus without complications ([place]) 40/98/1191  . Atrial fibrillation ([place]) 10/25/2016  . Acute medial meniscal tear 02/05/2016  . HNP (herniated nucleus pulposus), lumbar 09/12/2012  . 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 HEPATITIS C, CHRONIC 04/03/2007  . ERECTILE DYSFUNCTION 07/19/2006  . ADHD 07/19/2006  . GERD 07/19/2006  . BENIGN PROSTATIC HYPERTROPHY 07/19/2006   Past Medical History:  Diagnosis Date  . Anemia    rec'd 32 units of blood, post  T&A, 1981  . ATTENTION DEFICIT HYPERACTIVITY DISORDER 07/19/2006  . BENIGN PROSTATIC HYPERTROPHY 07/19/2006  . DIABETES MELLITUS, TYPE II 07/19/2006  . Dyspnea    had shortness of breath when heart was irregular  . Dysrhythmia    history of a-fib  . ERECTILE DYSFUNCTION 07/19/2006  . GERD 07/19/2006  . HEPATITIS C, CHRONIC 04/03/2007   Treated with Harboni   . History of blood transfusion   . NEOPLASM, MALIGNANT, CARCINOMA, BASAL CELL, NOSE 10/20/2009   Basal cell    Family History  Problem Relation Age of Onset  . Diabetes Maternal Aunt   . Cancer Paternal Grandmother        colon cancer  . Diabetes Maternal Aunt     Past Surgical History:  Procedure Laterality Date  . APPENDECTOMY    . carotid injury       post tonsillectomy, R carotid injury with trach  . HERNIA REPAIR  1973   inguinal- R side  . LUMBAR LAMINECTOMY/DECOMPRESSION MICRODISCECTOMY Right 09/12/2012   Procedure: LUMBAR LAMINECTOMY/DECOMPRESSION MICRODISCECTOMY 1 LEVEL;  Surgeon: Charlie Pitter, MD;  Location: [facility];  Service: Neurosurgery;  Laterality: Right;  Right lumbar three-four microdiscectomy  . ORIF SHOULDER FRACTURE Right 06/02/2017   Procedure: OPEN REDUCTION INTERNAL FIXATION (ORIF) RIGHT GLENOID FRACTURE, POSSIBLE LATARJET CORACOID TRANSFER;  Surgeon: Meredith Pel, MD;  Location: [facility];  Service: Orthopedics;  Laterality: Right;  . TONSILLECTOMY    . [place]   Social History   Occupational History  . Occupation: Best boy: [employer]  Tobacco Use  . Smoking status: Former Smoker    Years: 34.00    Last attempt to quit: 2007    Years since quitting: 12.4  . Smokeless tobacco: Former Systems developer    Quit date: 02/15/2007  Substance and Sexual Activity  . Alcohol use: Yes    Alcohol/week: 1.2 oz    Types: 2 Glasses of wine per week  . Drug use: No  . Sexual activity: Not on file

## 2017-07-28 ENCOUNTER — Other Ambulatory Visit: Payer: Self-pay | Admitting: Internal Medicine

## 2017-07-31 ENCOUNTER — Other Ambulatory Visit: Payer: Self-pay | Admitting: Internal Medicine

## 2017-08-23 ENCOUNTER — Other Ambulatory Visit: Payer: Self-pay | Admitting: Internal Medicine

## 2017-08-23 ENCOUNTER — Encounter (INDEPENDENT_AMBULATORY_CARE_PROVIDER_SITE_OTHER): Payer: Self-pay | Admitting: Orthopedic Surgery

## 2017-08-23 ENCOUNTER — Ambulatory Visit (INDEPENDENT_AMBULATORY_CARE_PROVIDER_SITE_OTHER): Payer: 59 | Admitting: Orthopedic Surgery

## 2017-08-23 DIAGNOSIS — S42151A Displaced fracture of neck of scapula, right shoulder, initial encounter for closed fracture: Secondary | ICD-10-CM

## 2017-08-23 DIAGNOSIS — S42141A Displaced fracture of glenoid cavity of scapula, right shoulder, initial encounter for closed fracture: Secondary | ICD-10-CM

## 2017-08-23 NOTE — Telephone Encounter (Signed)
Adderall refill Last Refill:06/14/17 #30 Last OV: 05/15/17 PCP: Dr. Inda Merlin Pharmacy:[facility] on [address]

## 2017-08-23 NOTE — Telephone Encounter (Signed)
Okay for refill? Please advise 

## 2017-08-23 NOTE — Progress Notes (Signed)
Post-Op Visit Note   Patient: RUFUS BESKE           Date of Birth: 1955/11/15           MRN: [id number] Visit Date: 08/23/2017 PCP: Marletta Lor, MD   Assessment & Plan:  Chief Complaint:  Chief Complaint  Patient presents with  . Right Shoulder - Follow-up    06/02/17 ORIF right glenoid fracture   Visit Diagnoses:  1. Glenoid fracture of shoulder, right, closed, initial encounter     Plan: Michaeal is a patient is now almost 3 months out right glenoid fracture fixation.  He is been doing well.  He is in physical therapy plus a home exercise program.  On examination he has very good range of motion of the shoulder and good rotator cuff strength of the supraspinatus and infraspinatus.  He does have a little bit of weakness in the subscapularis.  Plan at this time is to continue strengthening.  He is had no episodes of instability.  No coarse grinding or crepitus in the right shoulder region.  I think his strength will continue to improve over the next 4 to 6 months.  I do want him to avoid excessive abduction external rotation but in general the shoulder looks stable.  I will see him back as needed  Follow-Up Instructions: Return if symptoms worsen or fail to improve.   Orders:  No orders of the defined types were placed in this encounter.  No orders of the defined types were placed in this encounter.   Imaging: No results found.  PMFS History: Patient Active Problem List   Diagnosis Date Noted  . Diabetes mellitus due to underlying condition, controlled ([place]) 12/26/2016  . Type 2 diabetes mellitus without complications ([place]) 52/77/8242  . Atrial fibrillation ([place]) 10/25/2016  . Acute medial meniscal tear 02/05/2016  . HNP (herniated nucleus pulposus), lumbar 09/12/2012  . 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 HEPATITIS C, CHRONIC 04/03/2007  . ERECTILE DYSFUNCTION 07/19/2006  . ADHD 07/19/2006  . GERD 07/19/2006  . BENIGN PROSTATIC  HYPERTROPHY 07/19/2006   Past Medical History:  Diagnosis Date  . Anemia    rec'd 32 units of blood, post T&A, 1981  . ATTENTION DEFICIT HYPERACTIVITY DISORDER 07/19/2006  . BENIGN PROSTATIC HYPERTROPHY 07/19/2006  . DIABETES MELLITUS, TYPE II 07/19/2006  . Dyspnea    had shortness of breath when heart was irregular  . Dysrhythmia    history of a-fib  . ERECTILE DYSFUNCTION 07/19/2006  . GERD 07/19/2006  . HEPATITIS C, CHRONIC 04/03/2007   Treated with Harboni   . History of blood transfusion   . NEOPLASM, MALIGNANT, CARCINOMA, BASAL CELL, NOSE 10/20/2009   Basal cell    Family History  Problem Relation Age of Onset  . Diabetes Maternal Aunt   . Cancer Paternal Grandmother        colon cancer  . Diabetes Maternal Aunt     Past Surgical History:  Procedure Laterality Date  . APPENDECTOMY    . carotid injury       post tonsillectomy, R carotid injury with trach  . HERNIA REPAIR  1973   inguinal- R side  . LUMBAR LAMINECTOMY/DECOMPRESSION MICRODISCECTOMY Right 09/12/2012   Procedure: LUMBAR LAMINECTOMY/DECOMPRESSION MICRODISCECTOMY 1 LEVEL;  Surgeon: Charlie Pitter, MD;  Location: [facility];  Service: Neurosurgery;  Laterality: Right;  Right lumbar three-four microdiscectomy  . ORIF SHOULDER FRACTURE Right 06/02/2017   Procedure: OPEN REDUCTION INTERNAL FIXATION (  ORIF) RIGHT GLENOID FRACTURE, POSSIBLE LATARJET CORACOID TRANSFER;  Surgeon: Meredith Pel, MD;  Location: [facility];  Service: Orthopedics;  Laterality: Right;  . TONSILLECTOMY    . [place]   Social History   Occupational History  . Occupation: Best boy: [employer]  Tobacco Use  . Smoking status: Former Smoker    Years: 34.00    Last attempt to quit: 2007    Years since quitting: 12.5  . Smokeless tobacco: Former Systems developer    Quit date: 02/15/2007  Substance and Sexual Activity  . Alcohol use: Yes    Alcohol/week: 1.2 oz    Types: 2 Glasses of wine per week  . Drug use: No    . Sexual activity: Not on file

## 2017-08-23 NOTE — Telephone Encounter (Signed)
Copied from [place] [id number]. Topic: Quick Communication - Rx Refill/Question >> Aug 23, 2017  8:50 AM Synthia Innocent wrote: Medication: amphetamine-dextroamphetamine (ADDERALL XR) 30 MG 24 hr capsule and 10mg , 90 day supply  Has the patient contacted their pharmacy? Yes.   (Agent: If no, request that the patient contact the pharmacy for the refill.) (Agent: If yes, when and what did the pharmacy advise?)  Preferred Pharmacy (with phone number or street name):[facility] on [address]: Please be advised that RX refills may take up to 3 business days. We ask that you follow-up with your pharmacy.

## 2017-08-24 MED ORDER — AMPHETAMINE-DEXTROAMPHET ER 30 MG PO CP24: 30.0000 mg | ORAL_CAPSULE | ORAL | 0 refills | Status: DC

## 2017-08-25 ENCOUNTER — Telehealth: Payer: Self-pay

## 2017-08-25 NOTE — Telephone Encounter (Signed)
Pt only received 1 mo but need 65mo   amphetamine-dextroamphetamine (ADDERALL XR) 30 MG 24 hr capsule [[id number]] .please give pt a call when ready.

## 2017-08-28 ENCOUNTER — Other Ambulatory Visit: Payer: Self-pay | Admitting: Endocrinology

## 2017-08-28 NOTE — Telephone Encounter (Signed)
Last ov 03/09/16 1 canceled 1 no-show and no future scheduled refill or refuse please advise

## 2017-08-28 NOTE — Telephone Encounter (Signed)
Refuse 

## 2017-08-29 NOTE — Telephone Encounter (Signed)
Could you please E-scribe the rest of the pt Rx.  Thanks

## 2017-08-29 NOTE — Telephone Encounter (Signed)
He is to make appointment for refills otherwise we may need to dismiss him

## 2017-08-29 NOTE — Telephone Encounter (Signed)
Attempted to call pt and inform him of this, but he did not answer. Left a message for pt to call back.

## 2017-08-30 ENCOUNTER — Other Ambulatory Visit: Payer: Self-pay | Admitting: Internal Medicine

## 2017-08-30 MED ORDER — AMPHETAMINE-DEXTROAMPHET ER 30 MG PO CP24: 30.0000 mg | ORAL_CAPSULE | ORAL | 0 refills | Status: DC

## 2017-08-30 NOTE — Telephone Encounter (Signed)
Rx refilled. Pt notified.  

## 2017-09-22 ENCOUNTER — Other Ambulatory Visit: Payer: Self-pay | Admitting: Internal Medicine

## 2017-09-28 ENCOUNTER — Other Ambulatory Visit: Payer: Self-pay | Admitting: Internal Medicine

## 2017-09-28 MED ORDER — AMPHETAMINE-DEXTROAMPHET ER 30 MG PO CP24: 30.0000 mg | ORAL_CAPSULE | ORAL | 0 refills | Status: DC

## 2017-09-28 NOTE — Telephone Encounter (Signed)
Okay for refill? Please Escribe if so

## 2017-09-28 NOTE — Telephone Encounter (Signed)
Copied from [place] [phone]. Topic: General - Other >> Sep 28, 2017  8:07 AM Cecelia Byars, NT wrote: Reason for CRM: Patient is requesting a prescription for  amphetamine-dextroamphetamine  ADDERRAL 20 mg this is not in his current medication list he says he was previously taking this and says he was taken off of it ,and he is also asking for a refill for amphetamine-dextroamphetamine (ADDERALL XR) 30 MG 24 hr capsule sent to [facility], [address] AT [address] [phone] (Phone) [phone] (Fax)

## 2017-09-28 NOTE — Telephone Encounter (Signed)
Refill of adderall  LOV 05/19/17 Dr. Martinique  [facility] 08/30/17  #30  0 refills   Patient is also requesting a prescription for  amphetamine-dextroamphetamine  ADDERRAL 20 mg.  This is not on his current medication list. States he was previously taking this and says he was taken off of it.   [id number] - [place], [address] [phone] (Phone) [phone] (Fax)

## 2017-10-22 ENCOUNTER — Other Ambulatory Visit: Payer: Self-pay | Admitting: Internal Medicine

## 2017-10-29 ENCOUNTER — Other Ambulatory Visit: Payer: Self-pay | Admitting: Internal Medicine

## 2017-11-21 ENCOUNTER — Other Ambulatory Visit: Payer: Self-pay | Admitting: Internal Medicine

## 2017-11-21 NOTE — Telephone Encounter (Signed)
Pt is also requesting Adderall 30 Xr as well as 20mg . It looks like the pt Rx has been erased by accident.  Please advise

## 2017-11-22 ENCOUNTER — Other Ambulatory Visit: Payer: Self-pay | Admitting: Family Medicine

## 2017-11-22 MED ORDER — AMPHETAMINE-DEXTROAMPHET ER 30 MG PO CP24: 30.0000 mg | ORAL_CAPSULE | ORAL | 0 refills | Status: DC

## 2017-11-22 MED ORDER — AMPHETAMINE-DEXTROAMPHETAMINE 20 MG PO TABS: ORAL_TABLET | ORAL | 0 refills | Status: DC

## 2017-11-25 ENCOUNTER — Other Ambulatory Visit: Payer: Self-pay | Admitting: Internal Medicine

## 2017-12-21 ENCOUNTER — Other Ambulatory Visit: Payer: Self-pay | Admitting: Internal Medicine

## 2017-12-21 ENCOUNTER — Other Ambulatory Visit: Payer: Self-pay | Admitting: Family Medicine

## 2017-12-23 ENCOUNTER — Other Ambulatory Visit: Payer: Self-pay | Admitting: Family Medicine

## 2018-01-01 ENCOUNTER — Telehealth: Payer: Self-pay | Admitting: Internal Medicine

## 2018-01-01 MED ORDER — METFORMIN HCL 1000 MG PO TABS: 1000.0000 mg | ORAL_TABLET | Freq: Two times a day (BID) | ORAL | 1 refills | Status: DC

## 2018-01-01 MED ORDER — PEN NEEDLES 32G X 6 MM MISC: 2.0000 "pen " | Freq: Every day | 3 refills | Status: DC

## 2018-01-01 NOTE — Telephone Encounter (Signed)
erx sent

## 2018-01-01 NOTE — Telephone Encounter (Signed)
Pt needs a refill of his METFORMIN and his needles for his insulin, please send both to [facility]

## 2018-01-15 ENCOUNTER — Other Ambulatory Visit: Payer: Self-pay | Admitting: Family Medicine

## 2018-01-20 ENCOUNTER — Other Ambulatory Visit: Payer: Self-pay | Admitting: Internal Medicine

## 2018-01-22 ENCOUNTER — Encounter: Payer: Self-pay | Admitting: Internal Medicine

## 2018-01-22 ENCOUNTER — Other Ambulatory Visit (INDEPENDENT_AMBULATORY_CARE_PROVIDER_SITE_OTHER): Payer: 59

## 2018-01-22 ENCOUNTER — Ambulatory Visit (INDEPENDENT_AMBULATORY_CARE_PROVIDER_SITE_OTHER): Payer: 59 | Admitting: Internal Medicine

## 2018-01-22 VITALS — BP 128/84 | HR 68 | Temp 97.6°F | Ht 67.0 in | Wt 197.0 lb

## 2018-01-22 DIAGNOSIS — E785 Hyperlipidemia, unspecified: Secondary | ICD-10-CM | POA: Diagnosis not present

## 2018-01-22 DIAGNOSIS — Z0000 Encounter for general adult medical examination without abnormal findings: Secondary | ICD-10-CM | POA: Diagnosis not present

## 2018-01-22 DIAGNOSIS — I480 Paroxysmal atrial fibrillation: Secondary | ICD-10-CM | POA: Diagnosis not present

## 2018-01-22 DIAGNOSIS — E118 Type 2 diabetes mellitus with unspecified complications: Secondary | ICD-10-CM

## 2018-01-22 DIAGNOSIS — B182 Chronic viral hepatitis C: Secondary | ICD-10-CM

## 2018-01-22 LAB — CBC WITH DIFFERENTIAL/PLATELET
Basophils Absolute: 0 10*3/uL (ref 0.0–0.1)
Basophils Relative: 0.4 % (ref 0.0–3.0)
Eosinophils Absolute: 0.1 10*3/uL (ref 0.0–0.7)
Eosinophils Relative: 2.3 % (ref 0.0–5.0)
HCT: 47.8 % (ref 39.0–52.0)
Hemoglobin: 16.4 g/dL (ref 13.0–17.0)
Lymphocytes Relative: 25.2 % (ref 12.0–46.0)
Lymphs Abs: 1.6 10*3/uL (ref 0.7–4.0)
MCHC: 34.2 g/dL (ref 30.0–36.0)
MCV: 88.1 fl (ref 78.0–100.0)
Monocytes Absolute: 0.7 10*3/uL (ref 0.1–1.0)
Monocytes Relative: 10.7 % (ref 3.0–12.0)
Neutro Abs: 4 10*3/uL (ref 1.4–7.7)
Neutrophils Relative %: 61.4 % (ref 43.0–77.0)
Platelets: 182 10*3/uL (ref 150.0–400.0)
RBC: 5.43 Mil/uL (ref 4.22–5.81)
RDW: 13.2 % (ref 11.5–15.5)
WBC: 6.5 10*3/uL (ref 4.0–10.5)

## 2018-01-22 LAB — URINALYSIS, ROUTINE W REFLEX MICROSCOPIC
Bilirubin Urine: NEGATIVE
Hgb urine dipstick: NEGATIVE
Ketones, ur: NEGATIVE
Leukocytes, UA: NEGATIVE
Nitrite: NEGATIVE
RBC / HPF: NONE SEEN (ref 0–?)
Specific Gravity, Urine: 1.025 (ref 1.000–1.030)
Total Protein, Urine: NEGATIVE
Urine Glucose: >=1000 — AB
Urobilinogen, UA: 0.2 (ref 0.0–1.0)
pH: 5.5 (ref 5.0–8.0)

## 2018-01-22 LAB — PSA: PSA: 1.25 ng/mL (ref 0.10–4.00)

## 2018-01-22 LAB — COMPREHENSIVE METABOLIC PANEL
ALT: 23 U/L (ref 0–53)
AST: 25 U/L (ref 0–37)
Albumin: 4.3 g/dL (ref 3.5–5.2)
Alkaline Phosphatase: 63 U/L (ref 39–117)
BUN: 19 mg/dL (ref 6–23)
CO2: 26 mEq/L (ref 19–32)
Calcium: 9.4 mg/dL (ref 8.4–10.5)
Chloride: 105 mEq/L (ref 96–112)
Creatinine, Ser: 1 mg/dL (ref 0.40–1.50)
GFR: 80.27 mL/min (ref >60.00)
Glucose, Bld: 107 mg/dL — ABNORMAL HIGH (ref 70–99)
Potassium: 4.3 mEq/L (ref 3.5–5.1)
Sodium: 140 mEq/L (ref 135–145)
Total Bilirubin: 1.3 mg/dL — ABNORMAL HIGH (ref 0.2–1.2)
Total Protein: 6.5 g/dL (ref 6.0–8.3)

## 2018-01-22 LAB — POCT GLYCOSYLATED HEMOGLOBIN (HGB A1C)
HbA1c POC (<> result, manual entry): 0 % (ref 4.0–5.6)
HbA1c, POC (controlled diabetic range): 0 % (ref 0.0–7.0)
HbA1c, POC (prediabetic range): 0 % — AB (ref 5.7–6.4)
Hemoglobin A1C: 5.8 % — AB (ref 4.0–5.6)

## 2018-01-22 LAB — LIPID PANEL
Cholesterol: 96 mg/dL (ref 0–200)
HDL: 35.6 mg/dL — ABNORMAL LOW (ref >39.00)
LDL Cholesterol: 49 mg/dL (ref 0–99)
NonHDL: 60.84
Total CHOL/HDL Ratio: 3
Triglycerides: 61 mg/dL (ref 0.0–149.0)
VLDL: 12.2 mg/dL (ref 0.0–40.0)

## 2018-01-22 LAB — MICROALBUMIN / CREATININE URINE RATIO
Creatinine,U: 113.9 mg/dL
Microalb Creat Ratio: 1.1 mg/g (ref 0.0–30.0)
Microalb, Ur: 1.3 mg/dL (ref 0.0–1.9)

## 2018-01-22 LAB — TSH: TSH: 2.3 u[IU]/mL (ref 0.35–4.50)

## 2018-01-22 MED ORDER — METOPROLOL TARTRATE 25 MG PO TABS: 25.0000 mg | ORAL_TABLET | Freq: Two times a day (BID) | ORAL | 1 refills | Status: DC

## 2018-01-22 NOTE — Patient Instructions (Signed)

## 2018-01-22 NOTE — Progress Notes (Signed)
Subjective:  Patient ID: Michael Li, male    DOB: Jun 30, 1955  Age: 62 y.o. MRN: [id number]  CC: Annual Exam; Atrial Fibrillation; and Diabetes  NEW TO ME  HPI Michael Li presents for a CPX.  He tells me his blood sugars have been well controlled.  He denies polys.  He does have mild nocturia, 1-2 times per night.  This is not bothersome enough for him to want to treat it.  He has a history of A. fib.  He denies palpitations, CP, DOE, edema, or fatigue.  Outpatient Medications Prior to Visit  Medication Sig Dispense Refill  . amphetamine-dextroamphetamine (ADDERALL XR) 30 MG 24 hr capsule Take 1 capsule (30 mg total) by mouth every morning. 30 capsule 0  . amphetamine-dextroamphetamine (ADDERALL) 20 MG tablet 1 p.o. in the afternoon 30 tablet 0  . atorvastatin (LIPITOR) 10 MG tablet TAKE 1 TABLET(10 MG) BY MOUTH DAILY 90 tablet 0  . canagliflozin (INVOKANA) 100 MG TABS tablet TAKE 1 TABLET BY MOUTH DAILY BEFORE BREAKFAST (Patient taking differently: Take 100 mg by mouth daily before breakfast. ) 90 tablet 4  . ELIQUIS 5 MG TABS tablet TAKE 1 TABLET(5 MG) BY MOUTH TWICE DAILY 60 tablet 0  . Insulin Pen Needle (PEN NEEDLES) 32G X 6 MM MISC 2 pens by Does not apply route daily. 200 each 3  . metFORMIN (GLUCOPHAGE) 1000 MG tablet Take 1 tablet (1,000 mg total) by mouth 2 (two) times daily with a meal. 180 tablet 1  . omeprazole (PRILOSEC) 20 MG capsule TAKE 1 CAPSULE BY MOUTH DAILY 90 capsule 0  . Dulaglutide (TRULICITY) 1.5 HB/7.1IR SOPN INJECT 1.5 MG UNDER THE SKIN ONCE A WEEK (Patient taking differently: Inject 1.5 mg into the skin every Sunday. ) 2 mL 6  . HUMULIN N KWIKPEN 100 UNIT/ML Kiwkpen INJECT 42 UNITS UNDER THE SKIN AT BEDTIME 45 mL 2  . Insulin NPH, Human,, Isophane, (HUMULIN N KWIKPEN) 100 UNIT/ML Kiwkpen INJECT 42 UNITS UNDER THE SKIN AT BEDTIME (Patient taking differently: Inject 30 Units into the skin at bedtime. ) 9 mL 6  . metoprolol tartrate (LOPRESSOR) 25 MG  tablet TAKE 1 TABLET(25 MG) BY MOUTH TWICE DAILY 180 tablet 0  . Multiple Vitamin (MULTIVITAMIN WITH MINERALS) TABS Take 1 tablet by mouth daily.    . TRULICITY 1.5 CV/8.9FY SOPN INJECT 1.5 MG UNDER THE SKIN ONCE A WEEK 2 mL 0  . methocarbamol (ROBAXIN) 500 MG tablet Take 1 tablet (500 mg total) by mouth every 8 (eight) hours as needed for muscle spasms. 40 tablet 0  . oxyCODONE (ROXICODONE) 5 MG immediate release tablet 1 po q 8hrs prn pain 30 tablet 0   No facility-administered medications prior to visit.     ROS Review of Systems  Constitutional: Negative.  Negative for diaphoresis, fatigue and unexpected weight change.  HENT: Negative.   Eyes: Negative for visual disturbance.  Respiratory: Negative for cough, chest tightness, shortness of breath and wheezing.   Cardiovascular: Negative for chest pain, palpitations and leg swelling.  Gastrointestinal: Negative for abdominal pain, constipation, diarrhea, nausea and vomiting.  Endocrine: Negative.   Genitourinary: Negative.  Negative for difficulty urinating, dysuria, penile swelling, scrotal swelling, testicular pain and urgency.  Musculoskeletal: Negative.  Negative for arthralgias and myalgias.  Skin: Negative.   Neurological: Negative.  Negative for dizziness, syncope, weakness and light-headedness.  Hematological: Negative for adenopathy. Does not bruise/bleed easily.  Psychiatric/Behavioral: Negative.     Objective:  BP 128/84   Pulse 68  Temp 97.6 F (36.4 C) (Oral)   Ht 5\' 7"  (1.702 m)   Wt 197 lb (89.4 kg)   SpO2 93%   BMI 30.85 kg/m   BP Readings from Last 3 Encounters:  01/22/18 128/84  06/02/17 (!) 145/77  05/19/17 122/68    Wt Readings from Last 3 Encounters:  01/22/18 197 lb (89.4 kg)  05/19/17 200 lb 4 oz (90.8 kg)  05/17/17 198 lb (89.8 kg)    Physical Exam  Constitutional: He is oriented to person, place, and time. No distress.  HENT:  Mouth/Throat: Oropharynx is clear and moist. No oropharyngeal  exudate.  Eyes: Conjunctivae are normal. No scleral icterus.  Neck: Normal range of motion. Neck supple. No JVD present. No thyromegaly present.  Cardiovascular: Normal rate, regular rhythm and normal heart sounds. Exam reveals no gallop.  No murmur heard. EKG ---  Sinus  Rhythm  WITHIN NORMAL LIMITS   Pulmonary/Chest: Effort normal and breath sounds normal. No respiratory distress. He has no wheezes. He has no rhonchi. He has no rales.  Abdominal: Soft. Bowel sounds are normal. He exhibits no mass. There is no hepatosplenomegaly. There is no tenderness. Hernia confirmed negative in the right inguinal area and confirmed negative in the left inguinal area.  Genitourinary: Testes normal and penis normal. Rectal exam shows no external hemorrhoid, no internal hemorrhoid, no fissure, no mass, no tenderness, anal tone normal and guaiac negative stool. Prostate is enlarged (2+ smooth symm BPH). Prostate is not tender. Right testis shows no mass, no swelling and no tenderness. Left testis shows no mass, no swelling and no tenderness. Circumcised. No penile erythema or penile tenderness. No discharge found.  Musculoskeletal: Normal range of motion. He exhibits no edema, tenderness or deformity.  Lymphadenopathy:    He has no cervical adenopathy. No inguinal adenopathy noted on the right or left side.  Neurological: He is alert and oriented to person, place, and time.  Skin: Skin is warm and dry. No rash noted. He is not diaphoretic.  Vitals reviewed.   Lab Results  Component Value Date   WBC 6.5 01/22/2018   HGB 16.4 01/22/2018   HCT 47.8 01/22/2018   PLT 182.0 01/22/2018   GLUCOSE 107 (H) 01/22/2018   CHOL 96 01/22/2018   TRIG 61.0 01/22/2018   HDL 35.60 (L) 01/22/2018   LDLCALC 49 01/22/2018   ALT 23 01/22/2018   AST 25 01/22/2018   NA 140 01/22/2018   K 4.3 01/22/2018   CL 105 01/22/2018   CREATININE 1.00 01/22/2018   BUN 19 01/22/2018   CO2 26 01/22/2018   TSH 2.30 01/22/2018    PSA 1.25 01/22/2018   INR 1.00 06/02/2017   HGBA1C 5.8 (A) 01/22/2018   HGBA1C 0 01/22/2018   HGBA1C 0 (A) 01/22/2018   HGBA1C 0.0 01/22/2018   MICROALBUR 1.3 01/22/2018    No results found.  Assessment & Plan:   Michael Li was seen today for annual exam, atrial fibrillation and diabetes.  Diagnoses and all orders for this visit:  Type II diabetes mellitus with manifestations ([facility])- His A1c is down to 5.8%.  His blood sugars are over controlled.  I recommended that he stop using insulin and the GLP-1 agonist.  He will stay on the current dose of metformin and the SGLT2 inhibitor. -     Cancel: POCT glycosylated hemoglobin (Hb A1C) -     POCT glycosylated hemoglobin (Hb A1C) -     CBC with Differential/Platelet; Future -     Urinalysis, Routine  w reflex microscopic; Future -     Microalbumin / creatinine urine ratio; Future  Paroxysmal atrial fibrillation ([place])- He is maintaining sinus rhythm.  Will continue the current dose of metoprolol.  Will continue anticoagulation with Eliquis. -     EKG 12-Lead -     TSH; Future -     metoprolol tartrate (LOPRESSOR) 25 MG tablet; Take 1 tablet (25 mg total) by mouth 2 (two) times daily.  Hep C w/ coma, chronic (HCC)-  This has been eradicated s/p treatment with Harvoni. -     Comprehensive metabolic panel; Future  Routine general medical examination at a health care facility- Exam completed, labs reviewed, vaccines reviewed, screening for colon cancer is up-to-date, patient education material was given. -     Lipid panel; Future -     PSA; Future -     HIV Antibody (routine testing w rflx); Future  Hyperlipidemia LDL goal <100- He has achieved his LDL goal and is doing well on the statin.   I have discontinued Michael Li's multivitamin with minerals, Dulaglutide, Insulin NPH (Human) (Isophane), methocarbamol, oxyCODONE, HUMULIN N KWIKPEN, and TRULICITY. I have also changed his metoprolol tartrate. Additionally, I am having him maintain  his canagliflozin, omeprazole, atorvastatin, amphetamine-dextroamphetamine, amphetamine-dextroamphetamine, metFORMIN, Pen Needles, and ELIQUIS.  Meds ordered this encounter  Medications  . metoprolol tartrate (LOPRESSOR) 25 MG tablet    Sig: Take 1 tablet (25 mg total) by mouth 2 (two) times daily.    Dispense:  180 tablet    Refill:  1     Follow-up: Return in about 6 months (around 07/24/2018).  Scarlette Calico, MD

## 2018-01-23 ENCOUNTER — Encounter: Payer: Self-pay | Admitting: Internal Medicine

## 2018-01-23 LAB — HIV ANTIBODY (ROUTINE TESTING W REFLEX): HIV 1&2 Ab, 4th Generation: NONREACTIVE

## 2018-02-16 NOTE — Telephone Encounter (Signed)
Error

## 2018-02-19 ENCOUNTER — Other Ambulatory Visit: Payer: Self-pay | Admitting: Internal Medicine

## 2018-02-19 NOTE — Telephone Encounter (Addendum)
Copied from [place] [id number]. Topic: Quick Communication - See Telephone Encounter >> Feb 19, 2018 12:27 PM Ivar Drape wrote: CRM for notification. See Telephone encounter for: 02/19/18. Patient would like a refill on the following medications and have them sent to his preferred pharmacy [facility] on [address]  1) metoprolol tartrate (LOPRESSOR) 25 MG tablet  2) amphetamine-dextroamphetamine (ADDERALL XR) 30 MG 24 hr capsule

## 2018-02-20 ENCOUNTER — Other Ambulatory Visit: Payer: Self-pay | Admitting: Internal Medicine

## 2018-02-20 DIAGNOSIS — I480 Paroxysmal atrial fibrillation: Secondary | ICD-10-CM

## 2018-02-20 DIAGNOSIS — F900 Attention-deficit hyperactivity disorder, predominantly inattentive type: Secondary | ICD-10-CM

## 2018-02-20 MED ORDER — AMPHETAMINE-DEXTROAMPHET ER 30 MG PO CP24: 30.0000 mg | ORAL_CAPSULE | ORAL | 0 refills | Status: DC

## 2018-02-20 NOTE — Telephone Encounter (Signed)
Metoprolol available at preferred pharmacy. Adderall was previously filled by a different provider.

## 2018-02-26 ENCOUNTER — Ambulatory Visit: Payer: Self-pay

## 2018-02-26 NOTE — Telephone Encounter (Signed)
Pt. reported steady increase in blood sugars since the Trulicity and Humulin N Insulin were d/c'd on 01/23/19.  Reported his fasting readings have been averaging 250 + over past 2 weeks.  C/o intermittent blurry vision, frequent urination, and feeling overall weak and tired.  Stated he just hasn't felt good since the blood sugars have increased.  Reported continuing to take Invokana and Metformin as prescribed.  Appt. Sched. @ 8:00 AM 02/27/18 with PCP.  Care advice given per protocol.  Verb. Understanding.   Pt. also requested refill on Adderall 20 mg. tablet.  Stated he takes the Adderall  XR 30 mg q AM, and the 20 mg. q PM. Reported Dr. Raliegh Ip. recommended he take an additional 20 mg dose in the afternoon, if his work stress increased.  Stated he does not take an additional dose very often.  Also is requesting refill on Omeprazole.  Advised will send rx refill request through to Dr. Ronnald Ramp.  Agrees with plan.      Reason for Disposition . [1] Symptoms of high blood sugar (e.g., frequent urination, weak, weight loss) AND [2] not able to test blood glucose  Answer Assessment - Initial Assessment Questions 1. BLOOD GLUCOSE: "What is your blood glucose level?"      244 fasting at 8:00 AM 1/13,  351 @ 12:30 AM 1/13 2. ONSET: "When did you check the blood glucose?"     See 3. USUAL RANGE: "What is your glucose level usually?" (e.g., usual fasting morning value, usual evening value)     recent fasting readings: 278, 277  (250 or higher)   4. KETONES: "Do you check for ketones (urine or blood test strips)?" If yes, ask: "What does the test show now?"      no 5. TYPE 1 or 2:  "Do you know what type of diabetes you have?"  (e.g., Type 1, Type 2, Gestational; doesn't know)      Type 2 6. INSULIN: "Do you take insulin?" "What type of insulin(s) do you use? What is the mode of delivery? (syringe, pen (e.g., injection or  pump)?"      Trulicity and Humulin N was d/c'd in December.  7. DIABETES PILLS: "Do you take  any pills for your diabetes?" If yes, ask: "Have you missed taking any pills recently?"    Invokana, Metformin 8. OTHER SYMPTOMS: "Do you have any symptoms?" (e.g., fever, frequent urination, difficulty breathing, dizziness, weakness, vomiting)     Blurred vision yesterday, weak and tired, and freq. urination 9. PREGNANCY: "Is there any chance you are pregnant?" "When was your last menstrual period?"     N/a  Protocols used: DIABETES - HIGH BLOOD SUGAR-A-AH

## 2018-02-27 ENCOUNTER — Other Ambulatory Visit: Payer: Self-pay | Admitting: Internal Medicine

## 2018-02-27 ENCOUNTER — Ambulatory Visit (INDEPENDENT_AMBULATORY_CARE_PROVIDER_SITE_OTHER): Payer: 59 | Admitting: Internal Medicine

## 2018-02-27 ENCOUNTER — Encounter: Payer: Self-pay | Admitting: Internal Medicine

## 2018-02-27 VITALS — BP 144/82 | HR 61 | Temp 98.2°F | Resp 16 | Ht 67.0 in | Wt 195.0 lb

## 2018-02-27 DIAGNOSIS — E118 Type 2 diabetes mellitus with unspecified complications: Secondary | ICD-10-CM | POA: Diagnosis not present

## 2018-02-27 LAB — POCT GLUCOSE (DEVICE FOR HOME USE): Glucose Fasting, POC: 226 mg/dL — AB (ref 70–99)

## 2018-02-27 MED ORDER — INSULIN GLARGINE (1 UNIT DIAL) 300 UNIT/ML ~~LOC~~ SOPN: 20.0000 [IU] | PEN_INJECTOR | Freq: Every day | SUBCUTANEOUS | 1 refills | Status: DC

## 2018-02-27 MED ORDER — PEN NEEDLES 32G X 6 MM MISC: 1.0000 "pen " | Freq: Every day | 1 refills | Status: DC

## 2018-02-27 NOTE — Patient Instructions (Signed)
Type 2 Diabetes Mellitus, Diagnosis, Adult Type 2 diabetes (type 2 diabetes mellitus) is a long-term (chronic) disease. In type 2 diabetes, one or both of these problems may be present:  The pancreas does not make enough of a hormone called insulin.  Cells in the body do not respond properly to insulin that the body makes (insulin resistance). Normally, insulin allows blood sugar (glucose) to enter cells in the body. The cells use glucose for energy. Insulin resistance or lack of insulin causes excess glucose to build up in the blood instead of going into cells. As a result, high blood glucose (hyperglycemia) develops. What increases the risk? The following factors may make you more likely to develop type 2 diabetes:  Having a family member with type 2 diabetes.  Being overweight or obese.  Having an inactive (sedentary) lifestyle.  Having been diagnosed with insulin resistance.  Having a history of prediabetes, gestational diabetes, or polycystic ovary syndrome (PCOS).  Being of American-Indian, African-American, Hispanic/Latino, or Asian/Pacific Islander descent. What are the signs or symptoms? In the early stage of this condition, you may not have symptoms. Symptoms develop slowly and may include:  Increased thirst (polydipsia).  Increased hunger(polyphagia).  Increased urination (polyuria).  Increased urination during the night (nocturia).  Unexplained weight loss.  Frequent infections that keep coming back (recurring).  Fatigue.  Weakness.  Vision changes, such as blurry vision.  Cuts or bruises that are slow to heal.  Tingling or numbness in the hands or feet.  Dark patches on the skin (acanthosis nigricans). How is this diagnosed? This condition is diagnosed based on your symptoms, your medical history, a physical exam, and your blood glucose level. Your blood glucose may be checked with one or more of the following blood tests:  A fasting blood glucose (FBG)  test. You will not be allowed to eat (you will fast) for 8 hours or longer before a blood sample is taken.  A random blood glucose test. This test checks blood glucose at any time of day regardless of when you ate.  An A1c (hemoglobin A1c) blood test. This test provides information about blood glucose control over the previous 2-3 months.  An oral glucose tolerance test (OGTT). This test measures your blood glucose at two times: ? After fasting. This is your baseline blood glucose level. ? Two hours after drinking a beverage that contains glucose. You may be diagnosed with type 2 diabetes if:  Your FBG level is 126 mg/dL (7.0 mmol/L) or higher.  Your random blood glucose level is 200 mg/dL (11.1 mmol/L) or higher.  Your A1c level is 6.5% or higher.  Your OGTT result is higher than 200 mg/dL (11.1 mmol/L). These blood tests may be repeated to confirm your diagnosis. How is this treated? Your treatment may be managed by a specialist called an endocrinologist. Type 2 diabetes may be treated by following instructions from your health care provider about:  Making diet and lifestyle changes. This may include: ? Following an individualized nutrition plan that is developed by a diet and nutrition specialist (registered dietitian). ? Exercising regularly. ? Finding ways to manage stress.  Checking your blood glucose level as often as told.  Taking diabetes medicines or insulin daily. This helps to keep your blood glucose levels in the healthy range. ? If you use insulin, you may need to adjust the dosage depending on how physically active you are and what foods you eat. Your health care provider will tell you how to adjust your dosage.    Taking medicines to help prevent complications from diabetes, such as: ? Aspirin. ? Medicine to lower cholesterol. ? Medicine to control blood pressure. Your health care provider will set individualized treatment goals for you. Your goals will be based on  your age, other medical conditions you have, and how you respond to diabetes treatment. Generally, the goal of treatment is to maintain the following blood glucose levels:  Before meals (preprandial): 80-130 mg/dL (4.4-7.2 mmol/L).  After meals (postprandial): below 180 mg/dL (10 mmol/L).  A1c level: less than 7%. Follow these instructions at home: Questions to ask your health care provider  Consider asking the following questions: ? Do I need to meet with a diabetes educator? ? Where can I find a support group for people with diabetes? ? What equipment will I need to manage my diabetes at home? ? What diabetes medicines do I need, and when should I take them? ? How often do I need to check my blood glucose? ? What number can I call if I have questions? ? When is my next appointment? General instructions  Take over-the-counter and prescription medicines only as told by your health care provider.  Keep all follow-up visits as told by your health care provider. This is important.  For more information about diabetes, visit: ? American Diabetes Association (ADA): www.diabetes.org ? American Association of Diabetes Educators (AADE): www.diabeteseducator.org Contact a health care provider if:  Your blood glucose is at or above 240 mg/dL (13.3 mmol/L) for 2 days in a row.  You have been sick or have had a fever for 2 days or longer, and you are not getting better.  You have any of the following problems for more than 6 hours: ? You cannot eat or drink. ? You have nausea and vomiting. ? You have diarrhea. Get help right away if:  Your blood glucose is lower than 54 mg/dL (3.0 mmol/L).  You become confused or you have trouble thinking clearly.  You have difficulty breathing.  You have moderate or large ketone levels in your urine. Summary  Type 2 diabetes (type 2 diabetes mellitus) is a long-term (chronic) disease. In type 2 diabetes, the pancreas does not make enough of a  hormone called insulin, or cells in the body do not respond properly to insulin that the body makes (insulin resistance).  This condition is treated by making diet and lifestyle changes and taking diabetes medicines or insulin.  Your health care provider will set individualized treatment goals for you. Your goals will be based on your age, other medical conditions you have, and how you respond to diabetes treatment.  Keep all follow-up visits as told by your health care provider. This is important. This information is not intended to replace advice given to you by your health care provider. Make sure you discuss any questions you have with your health care provider. Document Released: 01/31/2005 Document Revised: 09/01/2016 Document Reviewed: 03/06/2015 Elsevier Interactive Patient Education  2019 Elsevier Inc.  

## 2018-02-27 NOTE — Progress Notes (Signed)
Subjective:  Patient ID: Michael Li, male    DOB: April 30, 1955  Age: 63 y.o. MRN: [id number]  CC: Diabetes   HPI SAMAAD HASHEM presents for concerns about hyperglycemia.  When I last saw him his A1c was down to 5.8% so I recommended that he stop using the NPH insulin.  Since then his blood sugars have consistently been in the 200s and then last night, after eating [place] food, the blood sugar spiked up into the 400s.  Outpatient Medications Prior to Visit  Medication Sig Dispense Refill  . amphetamine-dextroamphetamine (ADDERALL XR) 30 MG 24 hr capsule Take 1 capsule (30 mg total) by mouth every morning. 30 capsule 0  . amphetamine-dextroamphetamine (ADDERALL) 20 MG tablet 1 p.o. in the afternoon 30 tablet 0  . canagliflozin (INVOKANA) 100 MG TABS tablet TAKE 1 TABLET BY MOUTH DAILY BEFORE BREAKFAST (Patient taking differently: Take 100 mg by mouth daily before breakfast. ) 90 tablet 4  . ELIQUIS 5 MG TABS tablet TAKE 1 TABLET BY MOUTH TWICE DAILY 180 tablet 1  . metFORMIN (GLUCOPHAGE) 1000 MG tablet Take 1 tablet (1,000 mg total) by mouth 2 (two) times daily with a meal. 180 tablet 1  . metoprolol tartrate (LOPRESSOR) 25 MG tablet Take 1 tablet (25 mg total) by mouth 2 (two) times daily. 180 tablet 1  . atorvastatin (LIPITOR) 10 MG tablet TAKE 1 TABLET(10 MG) BY MOUTH DAILY 90 tablet 0  . Insulin Pen Needle (PEN NEEDLES) 32G X 6 MM MISC 2 pens by Does not apply route daily. 200 each 3  . omeprazole (PRILOSEC) 20 MG capsule TAKE 1 CAPSULE BY MOUTH DAILY 90 capsule 0   No facility-administered medications prior to visit.     ROS Review of Systems  Constitutional: Negative for diaphoresis and fatigue.  HENT: Negative.   Eyes: Negative for visual disturbance.  Respiratory: Negative for cough, chest tightness, shortness of breath and wheezing.   Cardiovascular: Negative for chest pain, palpitations and leg swelling.  Gastrointestinal: Negative for abdominal pain, diarrhea, nausea  and vomiting.  Endocrine: Positive for polydipsia, polyphagia and polyuria.  Genitourinary: Negative.  Negative for difficulty urinating and dysuria.  Musculoskeletal: Negative.  Negative for arthralgias and myalgias.  Skin: Negative.   Neurological: Positive for dizziness and light-headedness. Negative for weakness and headaches.  Hematological: Negative for adenopathy. Does not bruise/bleed easily.  Psychiatric/Behavioral: Negative.     Objective:  BP (!) 144/82 (BP Location: Left Arm, Patient Position: Sitting, Cuff Size: Normal)   Pulse 61   Temp 98.2 F (36.8 C) (Oral)   Resp 16   Ht 5\' 7"  (1.702 m)   Wt 195 lb (88.5 kg)   SpO2 95%   BMI 30.54 kg/m   BP Readings from Last 3 Encounters:  02/27/18 (!) 144/82  01/22/18 128/84  06/02/17 (!) 145/77    Wt Readings from Last 3 Encounters:  02/27/18 195 lb (88.5 kg)  01/22/18 197 lb (89.4 kg)  05/19/17 200 lb 4 oz (90.8 kg)    Physical Exam Vitals signs reviewed.  Constitutional:      General: He is not in acute distress.    Appearance: He is not ill-appearing, toxic-appearing or diaphoretic.  HENT:     Nose: Nose normal. No congestion.     Mouth/Throat:     Mouth: Mucous membranes are moist.     Pharynx: Oropharynx is clear. No oropharyngeal exudate or posterior oropharyngeal erythema.  Eyes:     General: No scleral icterus.    Conjunctiva/sclera:  Conjunctivae normal.  Neck:     Musculoskeletal: Normal range of motion and neck supple. No muscular tenderness.  Cardiovascular:     Rate and Rhythm: Normal rate and regular rhythm.     Heart sounds: No murmur. No gallop.   Pulmonary:     Effort: Pulmonary effort is normal.     Breath sounds: No stridor. No wheezing, rhonchi or rales.  Abdominal:     General: Abdomen is flat. Bowel sounds are normal.     Palpations: There is no mass.     Tenderness: There is no abdominal tenderness.  Musculoskeletal: Normal range of motion.        General: No swelling.     Right  lower leg: No edema.     Left lower leg: No edema.  Skin:    General: Skin is warm and dry.     Coloration: Skin is not pale.  Neurological:     General: No focal deficit present.     Mental Status: He is alert and oriented to person, place, and time. Mental status is at baseline.     Lab Results  Component Value Date   WBC 6.5 01/22/2018   HGB 16.4 01/22/2018   HCT 47.8 01/22/2018   PLT 182.0 01/22/2018   GLUCOSE 107 (H) 01/22/2018   CHOL 96 01/22/2018   TRIG 61.0 01/22/2018   HDL 35.60 (L) 01/22/2018   LDLCALC 49 01/22/2018   ALT 23 01/22/2018   AST 25 01/22/2018   NA 140 01/22/2018   K 4.3 01/22/2018   CL 105 01/22/2018   CREATININE 1.00 01/22/2018   BUN 19 01/22/2018   CO2 26 01/22/2018   TSH 2.30 01/22/2018   PSA 1.25 01/22/2018   INR 1.00 06/02/2017   HGBA1C 5.8 (A) 01/22/2018   HGBA1C 0 01/22/2018   HGBA1C 0 (A) 01/22/2018   HGBA1C 0.0 01/22/2018   MICROALBUR 1.3 01/22/2018    No results found.  Assessment & Plan:   Gavan was seen today for diabetes.  Diagnoses and all orders for this visit:  Type II diabetes mellitus with manifestations ([place])- He has developed hyperglycemia since stopping the NPH insulin.  His postprandial blood sugar today is 226.  I have asked him to add a basal insulin to his current hypoglycemic regimen.  He will start at the low dose of 20 units a day and will increase the dose over time as needed to gain better blood sugar control. -     Ambulatory referral to Ophthalmology -     POCT Glucose (Device for Home Use) -     Insulin Glargine, 1 Unit Dial, (TOUJEO SOLOSTAR) 300 UNIT/ML SOPN; Inject 20 Units into the skin daily. -     Insulin Pen Needle (PEN NEEDLES) 32G X 6 MM MISC; 1 pen by Does not apply route daily.   I have changed [name]'s Pen Needles. I am also having him start on Insulin Glargine (1 Unit Dial). Additionally, I am having him maintain his canagliflozin, amphetamine-dextroamphetamine, metFORMIN, metoprolol  tartrate, ELIQUIS, and amphetamine-dextroamphetamine.  Meds ordered this encounter  Medications  . Insulin Glargine, 1 Unit Dial, (TOUJEO SOLOSTAR) 300 UNIT/ML SOPN    Sig: Inject 20 Units into the skin daily.    Dispense:  4.5 mL    Refill:  1  . Insulin Pen Needle (PEN NEEDLES) 32G X 6 MM MISC    Sig: 1 pen by Does not apply route daily.    Dispense:  100 each  Refill:  1     Follow-up: Return in about 3 months (around 05/29/2018).  Scarlette Calico, MD

## 2018-03-05 ENCOUNTER — Other Ambulatory Visit: Payer: Self-pay | Admitting: Internal Medicine

## 2018-03-05 DIAGNOSIS — E118 Type 2 diabetes mellitus with unspecified complications: Secondary | ICD-10-CM

## 2018-03-05 DIAGNOSIS — F900 Attention-deficit hyperactivity disorder, predominantly inattentive type: Secondary | ICD-10-CM

## 2018-03-05 MED ORDER — AMPHETAMINE-DEXTROAMPHETAMINE 20 MG PO TABS: ORAL_TABLET | ORAL | 0 refills | Status: DC

## 2018-03-05 NOTE — Telephone Encounter (Signed)
Pt is requesting the 20 mg dose of Adderall. He takes the 20mg  very rarely at night.   Please advise if the 20 mg dose can be filled.

## 2018-03-05 NOTE — Telephone Encounter (Signed)
Please review pt's meds. re: request to refill Adderall 20 mg.  (he is on 2 different doses)  This note has been sent through x 2, previous to today.

## 2018-03-09 MED ORDER — CANAGLIFLOZIN 100 MG PO TABS: ORAL_TABLET | ORAL | 4 refills | Status: DC

## 2018-03-24 ENCOUNTER — Other Ambulatory Visit: Payer: Self-pay | Admitting: Internal Medicine

## 2018-03-24 DIAGNOSIS — F900 Attention-deficit hyperactivity disorder, predominantly inattentive type: Secondary | ICD-10-CM

## 2018-03-26 ENCOUNTER — Other Ambulatory Visit: Payer: Self-pay | Admitting: Internal Medicine

## 2018-03-26 MED ORDER — AMPHETAMINE-DEXTROAMPHET ER 30 MG PO CP24: 30.0000 mg | ORAL_CAPSULE | ORAL | 0 refills | Status: DC

## 2018-03-26 MED ORDER — AMPHETAMINE-DEXTROAMPHETAMINE 20 MG PO TABS: ORAL_TABLET | ORAL | 0 refills | Status: DC

## 2018-03-27 LAB — HM DIABETES EYE EXAM

## 2018-04-22 ENCOUNTER — Encounter: Payer: Self-pay | Admitting: Internal Medicine

## 2018-04-23 ENCOUNTER — Other Ambulatory Visit: Payer: Self-pay | Admitting: Internal Medicine

## 2018-04-23 DIAGNOSIS — F900 Attention-deficit hyperactivity disorder, predominantly inattentive type: Secondary | ICD-10-CM

## 2018-04-23 MED ORDER — AMPHETAMINE-DEXTROAMPHET ER 30 MG PO CP24: 30.0000 mg | ORAL_CAPSULE | ORAL | 0 refills | Status: DC

## 2018-05-23 ENCOUNTER — Telehealth: Payer: Self-pay

## 2018-05-23 NOTE — Telephone Encounter (Signed)
Key: [id number]   PA for eliquis has been approved.   Sending pt mychart message informing of same.

## 2018-05-24 ENCOUNTER — Other Ambulatory Visit: Payer: Self-pay

## 2018-05-24 ENCOUNTER — Other Ambulatory Visit: Payer: Self-pay | Admitting: Internal Medicine

## 2018-05-24 DIAGNOSIS — F900 Attention-deficit hyperactivity disorder, predominantly inattentive type: Secondary | ICD-10-CM

## 2018-05-24 MED ORDER — AMPHETAMINE-DEXTROAMPHET ER 30 MG PO CP24: 30.0000 mg | ORAL_CAPSULE | ORAL | 0 refills | Status: DC

## 2018-05-30 ENCOUNTER — Other Ambulatory Visit: Payer: Self-pay | Admitting: Internal Medicine

## 2018-05-30 DIAGNOSIS — F900 Attention-deficit hyperactivity disorder, predominantly inattentive type: Secondary | ICD-10-CM

## 2018-05-30 MED ORDER — AMPHETAMINE-DEXTROAMPHETAMINE 20 MG PO TABS: 20.0000 mg | ORAL_TABLET | Freq: Every day | ORAL | 0 refills | Status: DC

## 2018-06-04 ENCOUNTER — Other Ambulatory Visit: Payer: Self-pay | Admitting: Internal Medicine

## 2018-06-29 ENCOUNTER — Encounter: Payer: Self-pay | Admitting: Internal Medicine

## 2018-06-29 ENCOUNTER — Other Ambulatory Visit: Payer: Self-pay | Admitting: Internal Medicine

## 2018-06-29 DIAGNOSIS — F900 Attention-deficit hyperactivity disorder, predominantly inattentive type: Secondary | ICD-10-CM

## 2018-06-29 MED ORDER — AMPHETAMINE-DEXTROAMPHETAMINE 20 MG PO TABS: 20.0000 mg | ORAL_TABLET | Freq: Every day | ORAL | 0 refills | Status: DC

## 2018-06-29 MED ORDER — AMPHETAMINE-DEXTROAMPHET ER 30 MG PO CP24: 30.0000 mg | ORAL_CAPSULE | ORAL | 0 refills | Status: DC

## 2018-07-19 ENCOUNTER — Other Ambulatory Visit: Payer: Self-pay | Admitting: Internal Medicine

## 2018-07-19 DIAGNOSIS — I480 Paroxysmal atrial fibrillation: Secondary | ICD-10-CM

## 2018-08-03 ENCOUNTER — Telehealth: Payer: Self-pay | Admitting: Internal Medicine

## 2018-08-03 NOTE — Telephone Encounter (Signed)
Medication Refill - Medication: amphetamine-dextroamphetamine (ADDERALL XR) 30 MG 24 hr capsule   Has the patient contacted their pharmacy? No. (Agent: If no, request that the patient contact the pharmacy for the refill.) (Agent: If yes, when and what did the pharmacy advise?)  Preferred Pharmacy (with phone number or street name):  [id number] - [place], [place] [phone] (Phone) [phone] (Fax)     Agent: Please be advised that RX refills may take up to 3 business days. We ask that you follow-up with your pharmacy.

## 2018-08-04 NOTE — Telephone Encounter (Signed)
He needs to be seen  Michael Li

## 2018-08-06 NOTE — Telephone Encounter (Signed)
Pt contacted and is scheduled for an appt tomorrow at 3pm

## 2018-08-07 ENCOUNTER — Encounter: Payer: Self-pay | Admitting: Internal Medicine

## 2018-08-07 ENCOUNTER — Other Ambulatory Visit: Payer: Self-pay

## 2018-08-07 ENCOUNTER — Other Ambulatory Visit (INDEPENDENT_AMBULATORY_CARE_PROVIDER_SITE_OTHER): Payer: 59

## 2018-08-07 ENCOUNTER — Ambulatory Visit (INDEPENDENT_AMBULATORY_CARE_PROVIDER_SITE_OTHER): Payer: 59 | Admitting: Internal Medicine

## 2018-08-07 VITALS — BP 130/86 | HR 95 | Temp 97.4°F | Resp 16 | Ht 67.0 in | Wt 195.0 lb

## 2018-08-07 DIAGNOSIS — E118 Type 2 diabetes mellitus with unspecified complications: Secondary | ICD-10-CM | POA: Diagnosis not present

## 2018-08-07 DIAGNOSIS — R0609 Other forms of dyspnea: Secondary | ICD-10-CM | POA: Insufficient documentation

## 2018-08-07 DIAGNOSIS — I4811 Longstanding persistent atrial fibrillation: Secondary | ICD-10-CM

## 2018-08-07 DIAGNOSIS — N401 Enlarged prostate with lower urinary tract symptoms: Secondary | ICD-10-CM | POA: Diagnosis not present

## 2018-08-07 DIAGNOSIS — F900 Attention-deficit hyperactivity disorder, predominantly inattentive type: Secondary | ICD-10-CM

## 2018-08-07 DIAGNOSIS — R0600 Dyspnea, unspecified: Secondary | ICD-10-CM

## 2018-08-07 DIAGNOSIS — R3911 Hesitancy of micturition: Secondary | ICD-10-CM

## 2018-08-07 LAB — CBC WITH DIFFERENTIAL/PLATELET
Basophils Absolute: 0 10*3/uL (ref 0.0–0.1)
Basophils Relative: 0.6 % (ref 0.0–3.0)
Eosinophils Absolute: 0.1 10*3/uL (ref 0.0–0.7)
Eosinophils Relative: 1.4 % (ref 0.0–5.0)
HCT: 49.1 % (ref 39.0–52.0)
Hemoglobin: 17 g/dL (ref 13.0–17.0)
Lymphocytes Relative: 25.4 % (ref 12.0–46.0)
Lymphs Abs: 1.9 10*3/uL (ref 0.7–4.0)
MCHC: 34.6 g/dL (ref 30.0–36.0)
MCV: 88.5 fl (ref 78.0–100.0)
Monocytes Absolute: 0.7 10*3/uL (ref 0.1–1.0)
Monocytes Relative: 9.6 % (ref 3.0–12.0)
Neutro Abs: 4.6 10*3/uL (ref 1.4–7.7)
Neutrophils Relative %: 63 % (ref 43.0–77.0)
Platelets: 177 10*3/uL (ref 150.0–400.0)
RBC: 5.55 Mil/uL (ref 4.22–5.81)
RDW: 13.1 % (ref 11.5–15.5)
WBC: 7.3 10*3/uL (ref 4.0–10.5)

## 2018-08-07 LAB — BASIC METABOLIC PANEL
BUN: 20 mg/dL (ref 6–23)
CO2: 26 mEq/L (ref 19–32)
Calcium: 9.8 mg/dL (ref 8.4–10.5)
Chloride: 101 mEq/L (ref 96–112)
Creatinine, Ser: 0.88 mg/dL (ref 0.40–1.50)
GFR: 87.38 mL/min (ref >60.00)
Glucose, Bld: 201 mg/dL — ABNORMAL HIGH (ref 70–99)
Potassium: 4.7 mEq/L (ref 3.5–5.1)
Sodium: 136 mEq/L (ref 135–145)

## 2018-08-07 LAB — HEMOGLOBIN A1C: Hgb A1c MFr Bld: 8.1 % — ABNORMAL HIGH (ref 4.6–6.5)

## 2018-08-07 LAB — TROPONIN I: TNIDX: 0 ug/l (ref 0.00–0.06)

## 2018-08-07 LAB — BRAIN NATRIURETIC PEPTIDE: Pro B Natriuretic peptide (BNP): 103 pg/mL — ABNORMAL HIGH (ref 0.0–100.0)

## 2018-08-07 MED ORDER — CANAGLIFLOZIN 300 MG PO TABS: 300.0000 mg | ORAL_TABLET | Freq: Every day | ORAL | 1 refills | Status: DC

## 2018-08-07 MED ORDER — TOUJEO SOLOSTAR 300 UNIT/ML ~~LOC~~ SOPN: 40.0000 [IU] | PEN_INJECTOR | Freq: Every day | SUBCUTANEOUS | 1 refills | Status: DC

## 2018-08-07 MED ORDER — AMPHETAMINE-DEXTROAMPHET ER 30 MG PO CP24: 30.0000 mg | ORAL_CAPSULE | ORAL | 0 refills | Status: DC

## 2018-08-07 MED ORDER — ALFUZOSIN HCL ER 10 MG PO TB24: 10.0000 mg | ORAL_TABLET | Freq: Every day | ORAL | 1 refills | Status: DC

## 2018-08-07 NOTE — Patient Instructions (Signed)
Atrial Fibrillation Atrial fibrillation is a type of irregular or rapid heartbeat (arrhythmia). In atrial fibrillation, the top part of the heart (atria) quivers in a chaotic pattern. This makes the heart unable to pump blood normally. Having atrial fibrillation can increase your risk for other health problems, such as:  Blood can pool in the atria and form clots. If a clot travels to the brain, it can cause a stroke.  The heart muscle may weaken from the irregular blood flow. This can cause heart failure. Atrial fibrillation may start suddenly and stop on its own, or it may become a long-lasting problem. What are the causes? This condition is caused by some heart-related conditions or procedures, including:  High blood pressure. This is the most common cause.  Heart failure.  Heart valve conditions.  Inflammation of the sac that surrounds the heart (pericarditis).  Heart surgery.  Coronary artery disease.  Certain heart rhythm disorders, such as Wolf-Parkinson-White syndrome. Other causes include:  Pneumonia.  Obstructive sleep apnea.  Lung cancer.  Thyroid problems, especially if the thyroid is overactive (hyperthyroidism).  Excessive alcohol or drug use. Sometimes, the cause of this condition is not known. What increases the risk? This condition is more likely to develop in:  Older people.  People who smoke.  People who have diabetes mellitus.  People who are overweight (obese).  Athletes who exercise vigorously.  People who have a family history. What are the signs or symptoms? Symptoms of this condition include:  A feeling that your heart is beating rapidly or irregularly.  A feeling of discomfort or pain in your chest.  Shortness of breath.  Sudden light-headedness or weakness.  Getting tired easily during exercise. In some cases, there are no symptoms. How is this diagnosed? Your health care provider may be able to detect atrial fibrillation when  taking your pulse. If detected, this condition may be diagnosed with:  Electrocardiogram (ECG).  Ambulatory cardiac monitor. This device records your heartbeats for 24 hours or more.  Transthoracic echocardiogram (TTE) to evaluate how blood flows through your heart.  Transesophageal echocardiogram (TEE) to view more detailed images of your heart.  A stress test.  Imaging tests, such as a CT scan or chest X-ray.  Blood tests. How is this treated? This condition may be treated with:  Medicines to slow down the heart rate or bring the heart's rhythm back to normal.  Medicines to prevent blood clots from forming.  Electrical cardioversion. This delivers a low-energy shock to the heart to reset its rhythm.  Ablation. This procedure destroys the part of the heart tissue that sends abnormal signals.  Left atrial appendage occlusion/excision. This seals off a common place in the atria where blood clots can form (left atrial appendage). The goal of treatment is to prevent blood clots from forming and to keep your heart beating at a normal rate and rhythm. Treatment depends on underlying medical conditions and how you feel when you are experiencing fibrillation. Follow these instructions at home: Medicines  Take over-the counter and prescription medicines only as told by your health care provider.  If your health care provider prescribed a blood-thinning medicine (anticoagulant), take it exactly as told. Taking too much blood-thinning medicine can cause bleeding. Taking too little can enable a blood clot to form and travel to the brain, causing a stroke. Lifestyle      Do not use any products that contain nicotine or tobacco, such as cigarettes and e-cigarettes. If you need help quitting, ask your health   care provider.  Do not drink beverages that contain caffeine, such as coffee, soda, and tea.  Follow diet instructions as told by your health care provider.  Exercise regularly as  told by your health care provider.  Do not drink alcohol. General instructions  If you have obstructive sleep apnea, manage your condition as told by your health care provider.  Maintain a healthy weight. Do not use diet pills unless your health care provider approves. Diet pills may make heart problems worse.  Keep all follow-up visits as told by your health care provider. This is important. Contact a health care provider if you:  Notice a change in the rate, rhythm, or strength of your heartbeat.  Are taking an anticoagulant and you notice increased bruising.  Tire more easily when you exercise or exert yourself.  Have a sudden change in weight. Get help right away if you have:   Chest pain, abdominal pain, sweating, or weakness.  Difficulty breathing.  Blood in your vomit, stool (feces), or urine.  Any symptoms of a stroke. "BE FAST" is an easy way to remember the main warning signs of a stroke: ? B - Balance. Signs are dizziness, sudden trouble walking, or loss of balance. ? E - Eyes. Signs are trouble seeing or a sudden change in vision. ? F - Face. Signs are sudden weakness or numbness of the face, or the face or eyelid drooping on one side. ? A - Arms. Signs are weakness or numbness in an arm. This happens suddenly and usually on one side of the body. ? S - Speech. Signs are sudden trouble speaking, slurred speech, or trouble understanding what people say. ? T - Time. Time to call emergency services. Write down what time symptoms started.  Other signs of a stroke, such as: ? A sudden, severe headache with no known cause. ? Nausea or vomiting. ? Seizure. These symptoms may represent a serious problem that is an emergency. Do not wait to see if the symptoms will go away. Get medical help right away. Call your local emergency services (911 in the [place] Do not drive yourself to the hospital. Summary  Atrial fibrillation is a type of irregular or rapid heartbeat  (arrhythmia).  Symptoms include a feeling that your heart is beating fast or irregularly. In some cases, you may not have symptoms.  The condition is treated with medicines to slow down the heart rate or bring the heart's rhythm back to normal. You may also need blood-thinning medicines to prevent blood clots.  Get help right away if you have symptoms or signs of a stroke. This information is not intended to replace advice given to you by your health care provider. Make sure you discuss any questions you have with your health care provider. Document Released: 01/31/2005 Document Revised: 03/24/2017 Document Reviewed: 03/24/2017 Elsevier Interactive Patient Education  2019 [employer] Inc.  

## 2018-08-07 NOTE — Progress Notes (Signed)
Subjective:  Patient ID: Michael Li, male    DOB: 1955-05-25  Age: 63 y.o. MRN: [id number]  CC: Atrial Fibrillation and Hypertension   HPI Michael Li presents for f/up - He complains of new onset dyspnea on exertion and fatigue for the last few months.  He is not aware of any palpitations but he does complain of weight gain.  He denies edema, chest pain, diaphoresis, dizziness, or lightheadedness.  Outpatient Medications Prior to Visit  Medication Sig Dispense Refill  . amphetamine-dextroamphetamine (ADDERALL) 20 MG tablet Take 1 tablet (20 mg total) by mouth daily. 90 tablet 0  . atorvastatin (LIPITOR) 10 MG tablet TAKE 1 TABLET(10 MG) BY MOUTH DAILY 90 tablet 1  . ELIQUIS 5 MG TABS tablet TAKE 1 TABLET BY MOUTH TWICE DAILY 180 tablet 1  . Insulin Pen Needle (PEN NEEDLES) 32G X 6 MM MISC 1 pen by Does not apply route daily. 100 each 1  . metFORMIN (GLUCOPHAGE) 1000 MG tablet TAKE 1 TABLET TWICE A DAY WITH A MEAL 180 tablet 0  . metoprolol tartrate (LOPRESSOR) 25 MG tablet TAKE 1 TABLET(25 MG) BY MOUTH TWICE DAILY 180 tablet 1  . omeprazole (PRILOSEC) 20 MG capsule TAKE 1 CAPSULE BY MOUTH DAILY 90 capsule 1  . amphetamine-dextroamphetamine (ADDERALL XR) 30 MG 24 hr capsule Take 1 capsule (30 mg total) by mouth every morning. 30 capsule 0  . canagliflozin (INVOKANA) 100 MG TABS tablet TAKE 1 TABLET BY MOUTH DAILY BEFORE BREAKFAST 90 tablet 4  . Insulin Glargine, 1 Unit Dial, (TOUJEO SOLOSTAR) 300 UNIT/ML SOPN Inject 20 Units into the skin daily. 4.5 mL 1   No facility-administered medications prior to visit.     ROS Review of Systems  Constitutional: Positive for fatigue and unexpected weight change. Negative for diaphoresis.  HENT: Negative.   Eyes: Negative.   Respiratory: Positive for shortness of breath. Negative for apnea, cough, chest tightness and wheezing.   Cardiovascular: Negative for chest pain, palpitations and leg swelling.  Gastrointestinal: Negative for  abdominal pain, diarrhea, nausea and vomiting.  Endocrine: Positive for polydipsia, polyphagia and polyuria.  Genitourinary: Positive for difficulty urinating. Negative for dysuria.       He complains of urinary hesitancy and weak urine stream  Musculoskeletal: Negative.  Negative for arthralgias and myalgias.  Skin: Negative.  Negative for color change and pallor.  Neurological: Negative.  Negative for dizziness, weakness and light-headedness.  Hematological: Negative for adenopathy. Does not bruise/bleed easily.  Psychiatric/Behavioral: Positive for decreased concentration. Negative for confusion, dysphoric mood, sleep disturbance and suicidal ideas. The patient is not nervous/anxious.     Objective:  BP 130/86 (BP Location: Left Arm, Patient Position: Sitting, Cuff Size: Normal)   Pulse 95   Temp (!) 97.4 F (36.3 C) (Oral)   Resp 16   Ht 5\' 7"  (1.702 m)   Wt 195 lb (88.5 kg)   SpO2 97%   BMI 30.54 kg/m   BP Readings from Last 3 Encounters:  08/07/18 130/86  02/27/18 (!) 144/82  01/22/18 128/84    Wt Readings from Last 3 Encounters:  08/07/18 195 lb (88.5 kg)  02/27/18 195 lb (88.5 kg)  01/22/18 197 lb (89.4 kg)    Physical Exam Constitutional:      Appearance: He is obese. He is not ill-appearing.  HENT:     Nose: Nose normal.     Mouth/Throat:     Mouth: Mucous membranes are moist.     Pharynx: No oropharyngeal exudate or posterior  oropharyngeal erythema.  Eyes:     General: No scleral icterus.    Conjunctiva/sclera: Conjunctivae normal.  Neck:     Musculoskeletal: Normal range of motion. No neck rigidity.  Cardiovascular:     Rate and Rhythm: Normal rate. Rhythm irregularly irregular.     Heart sounds: No murmur. No gallop.      Comments: EKG ---  Atrial fibrillation  -irregular conduction  ABNORMAL RHYTHM Pulmonary:     Effort: Pulmonary effort is normal.     Breath sounds: No stridor. No wheezing, rhonchi or rales.  Abdominal:     General: Abdomen is  protuberant. Bowel sounds are normal. There is no distension.     Palpations: Abdomen is soft. There is no hepatomegaly or splenomegaly.     Tenderness: There is no abdominal tenderness.  Musculoskeletal: Normal range of motion.     Right lower leg: No edema.     Left lower leg: No edema.  Lymphadenopathy:     Cervical: No cervical adenopathy.  Skin:    General: Skin is warm and dry.  Neurological:     General: No focal deficit present.     Mental Status: He is oriented to person, place, and time. Mental status is at baseline.  Psychiatric:        Mood and Affect: Mood normal.        Behavior: Behavior normal.        Thought Content: Thought content normal.        Judgment: Judgment normal.     Lab Results  Component Value Date   WBC 7.3 08/07/2018   HGB 17.0 08/07/2018   HCT 49.1 08/07/2018   PLT 177.0 08/07/2018   GLUCOSE 201 (H) 08/07/2018   CHOL 96 01/22/2018   TRIG 61.0 01/22/2018   HDL 35.60 (L) 01/22/2018   LDLCALC 49 01/22/2018   ALT 23 01/22/2018   AST 25 01/22/2018   NA 136 08/07/2018   K 4.7 08/07/2018   CL 101 08/07/2018   CREATININE 0.88 08/07/2018   BUN 20 08/07/2018   CO2 26 08/07/2018   TSH 2.30 01/22/2018   PSA 1.25 01/22/2018   INR 1.00 06/02/2017   HGBA1C 8.1 (H) 08/07/2018   MICROALBUR 1.3 01/22/2018    No results found.  Assessment & Plan:   [name] was seen today for atrial fibrillation and hypertension.  Diagnoses and all orders for this visit:  DOE (dyspnea on exertion)- His EKG is not interpretable for ischemia.  His BNP is very slightly elevated but his troponin is negative.  I want to screen him for cardiac ischemia so I have asked him to undergo a myocardial perfusion imaging. -     EKG 12-Lead -     MYOCARDIAL PERFUSION IMAGING; Future -     Brain natriuretic peptide; Future -     CBC with Differential/Platelet; Future -     Troponin I; Future  Attention deficit hyperactivity disorder (ADHD), predominantly inattentive type -      amphetamine-dextroamphetamine (ADDERALL XR) 30 MG 24 hr capsule; Take 1 capsule (30 mg total) by mouth every morning.  Benign prostatic hyperplasia with urinary hesitancy -     alfuzosin (UROXATRAL) 10 MG 24 hr tablet; Take 1 tablet (10 mg total) by mouth daily with breakfast.  Longstanding persistent atrial fibrillation- He wants to see cardiology to see if there is an option for him to be cardioverted. -     MYOCARDIAL PERFUSION IMAGING; Future -     Ambulatory referral to  Cardiology  Type II diabetes mellitus with manifestations (HCC)-his A1c is up to 8.1%.  In addition to lifestyle modifications of asked him to increase the doses of Invokana and the basal insulin. -     Basic metabolic panel; Future -     Hemoglobin A1c; Future -     canagliflozin (INVOKANA) 300 MG TABS tablet; Take 1 tablet (300 mg total) by mouth daily before breakfast. -     Insulin Glargine, 1 Unit Dial, (TOUJEO SOLOSTAR) 300 UNIT/ML SOPN; Inject 40 Units into the skin daily.   I have discontinued Mikeal Hawthorne T. Stice's canagliflozin. I have also changed his Insulin Glargine (1 Unit Dial) to [employer]. Additionally, I am having him start on alfuzosin and canagliflozin. Lastly, I am having him maintain his Eliquis, Pen Needles, atorvastatin, omeprazole, metFORMIN, amphetamine-dextroamphetamine, metoprolol tartrate, and amphetamine-dextroamphetamine.  Meds ordered this encounter  Medications  . amphetamine-dextroamphetamine (ADDERALL XR) 30 MG 24 hr capsule    Sig: Take 1 capsule (30 mg total) by mouth every morning.    Dispense:  30 capsule    Refill:  0  . alfuzosin (UROXATRAL) 10 MG 24 hr tablet    Sig: Take 1 tablet (10 mg total) by mouth daily with breakfast.    Dispense:  90 tablet    Refill:  1  . canagliflozin (INVOKANA) 300 MG TABS tablet    Sig: Take 1 tablet (300 mg total) by mouth daily before breakfast.    Dispense:  90 tablet    Refill:  1  . Insulin Glargine, 1 Unit Dial, (TOUJEO SOLOSTAR) 300  UNIT/ML SOPN    Sig: Inject 40 Units into the skin daily.    Dispense:  4.5 mL    Refill:  1     Follow-up: Return in about 3 months (around 11/07/2018).  Scarlette Calico, MD

## 2018-08-18 ENCOUNTER — Other Ambulatory Visit: Payer: Self-pay | Admitting: Internal Medicine

## 2018-08-23 ENCOUNTER — Encounter: Payer: Self-pay | Admitting: Internal Medicine

## 2018-08-23 NOTE — Addendum Note (Signed)
Addended by: Aviva Signs M on: 08/23/2018 03:26 PM   Modules accepted: Orders

## 2018-08-24 NOTE — Telephone Encounter (Signed)
I have spoken to pt regarding email

## 2018-08-27 ENCOUNTER — Telehealth (HOSPITAL_COMMUNITY): Payer: Self-pay | Admitting: *Deleted

## 2018-08-27 NOTE — Telephone Encounter (Signed)
Patient given detailed instructions per Myocardial Perfusion Study Information Sheet for the test on 08/30/18 at 7:45. Patient notified to arrive 15 minutes early and that it is imperative to arrive on time for appointment to keep from having the test rescheduled.  If you need to cancel or reschedule your appointment, please call the office within 24 hours of your appointment. . Patient verbalized understanding.Michael Li

## 2018-08-30 ENCOUNTER — Other Ambulatory Visit: Payer: Self-pay

## 2018-08-30 ENCOUNTER — Encounter: Payer: Self-pay | Admitting: Internal Medicine

## 2018-08-30 ENCOUNTER — Ambulatory Visit (HOSPITAL_COMMUNITY): Payer: 59 | Attending: Internal Medicine

## 2018-08-30 DIAGNOSIS — I4811 Longstanding persistent atrial fibrillation: Secondary | ICD-10-CM | POA: Diagnosis not present

## 2018-08-30 DIAGNOSIS — R0600 Dyspnea, unspecified: Secondary | ICD-10-CM

## 2018-08-30 DIAGNOSIS — R0609 Other forms of dyspnea: Secondary | ICD-10-CM

## 2018-08-30 LAB — MYOCARDIAL PERFUSION IMAGING
LV dias vol: 76 mL (ref 62–150)
LV sys vol: 29 mL
Peak HR: 91 {beats}/min
Rest HR: 84 {beats}/min
SDS: 0
SRS: 0
SSS: 0
TID: 1.08

## 2018-08-30 MED ORDER — TECHNETIUM TC 99M TETROFOSMIN IV KIT
31.5000 | PACK | Freq: Once | INTRAVENOUS | Status: AC | PRN
  Administered 2018-08-30: 31.5 via INTRAVENOUS
  Filled 2018-08-30: qty 32

## 2018-08-30 MED ORDER — TECHNETIUM TC 99M TETROFOSMIN IV KIT
10.4000 | PACK | Freq: Once | INTRAVENOUS | Status: AC | PRN
  Administered 2018-08-30: 10.4 via INTRAVENOUS
  Filled 2018-08-30: qty 11

## 2018-08-30 MED ORDER — REGADENOSON 0.4 MG/5ML IV SOLN
0.4000 mg | Freq: Once | INTRAVENOUS | Status: AC
  Administered 2018-08-30: 0.4 mg via INTRAVENOUS

## 2018-09-03 ENCOUNTER — Other Ambulatory Visit: Payer: Self-pay | Admitting: Internal Medicine

## 2018-09-03 ENCOUNTER — Encounter: Payer: Self-pay | Admitting: Internal Medicine

## 2018-09-03 DIAGNOSIS — F900 Attention-deficit hyperactivity disorder, predominantly inattentive type: Secondary | ICD-10-CM

## 2018-09-03 MED ORDER — AMPHETAMINE-DEXTROAMPHET ER 30 MG PO CP24: 30.0000 mg | ORAL_CAPSULE | ORAL | 0 refills | Status: DC

## 2018-09-03 MED ORDER — AMPHETAMINE-DEXTROAMPHETAMINE 20 MG PO TABS: 20.0000 mg | ORAL_TABLET | Freq: Every day | ORAL | 0 refills | Status: DC

## 2018-09-03 NOTE — Progress Notes (Signed)
Cardiology Office Note:    Date:  09/04/2018   ID:  Michael Li, Michael Li 1956-02-13, MRN [id number]  PCP:  Michael Lima, MD  Cardiologist:  Shirlee More, MD   Referring MD: Michael Lima, MD  ASSESSMENT:    1. SOB (shortness of breath)   2. Persistent atrial fibrillation   3. Diabetes mellitus due to underlying condition, controlled, without complication, without long-term current use of insulin ([place])   4. Chronic anticoagulation    PLAN:    In order of problems listed above:  1. I suspect her shortness of breath is pulmonary with greater than 60-pack-year smoking history and the fact that he has had a very good cardiology evaluation occluding a proBNP and echocardiogram excluding heart failure and a myocardial perfusion study with no evidence of ischemia.  I asked him to have a chest x-ray performed but would like to have it done pulmonary function test do not think that is an option right now during the COVID-19 error. 2. Atrial fibrillation become apparent now that it is paroxysmal when he arrived he alerted [place] he was seen 2018 and at that time he had paroxysmal atrial fibrillation he is not particularly symptomatic except for some palpitation after discussion of options for maintaining sinus rhythm we will start him on low-dose flecainide check an EKG in a week and a ZIO monitor afterwards to maintain sinus rhythm 3. Stable managed by his PCP 4. Continue his anticoagulant  Next appointment 4 weeks   Medication Adjustments/Labs and Tests Ordered: Current medicines are reviewed at length with the patient today.  Concerns regarding medicines are outlined above.  No orders of the defined types were placed in this encounter.  No orders of the defined types were placed in this encounter.    Chief Complaint  Patient presents with  . Shortness of Breath    History of Present Illness:    Michael Li is a 63 y.o. male with new  08/07/2018 atrial fibrillation who is being  seen today for the evaluation of exertional SOB at the request of Michael Lima, MD.  He had a recent pharmacologic myocardial perfusion study 08/30/2018 ejection fraction 62% with normal perfusion.  Echocardiogram 11/28/2016 showed an ejection fraction 55 to 60% mild concentric left ventricular hypertrophy mild left atrial enlargement mild tricuspid regurgitation with normal pulmonary artery systolic pressure.  A proBNP level performed 08/07/2018 for shortness of breath was quite low at 103 making heart failure an unlikely diagnosis  He has a history of paroxysmal atrial fibrillation dating back to 2018 and says about half of the time his pulse is irregular he has palpitation with that.  He has never had an attempt to maintain sinus rhythm  and will place him on low-dose flecainide.  His liver disease is improved he tolerates his anticoagulant.  His clinical problem is exertional shortness of breath when he is outdoors climbing stairs is been present for a year and has just been persistent and bothersome.  No edema orthopnea cough or wheezing no chest pain or syncope.  Past Medical History:  Diagnosis Date  . Anemia    rec'd 32 units of blood, post T&A, 1981  . ATTENTION DEFICIT HYPERACTIVITY DISORDER 07/19/2006  . BENIGN PROSTATIC HYPERTROPHY 07/19/2006  . DIABETES MELLITUS, TYPE II 07/19/2006  . Dyspnea    had shortness of breath when heart was irregular  . Dysrhythmia    history of a-fib  . ERECTILE DYSFUNCTION 07/19/2006  . GERD 07/19/2006  .  HEPATITIS C, CHRONIC 04/03/2007   Treated with [place]   . History of blood transfusion   . NEOPLASM, MALIGNANT, CARCINOMA, BASAL CELL, NOSE 10/20/2009   Basal cell    Past Surgical History:  Procedure Laterality Date  . APPENDECTOMY    . carotid injury       post tonsillectomy, R carotid injury with trach  . HERNIA REPAIR  1973   inguinal- R side  . KNEE SURGERY Right   . LUMBAR LAMINECTOMY/DECOMPRESSION MICRODISCECTOMY Right 09/12/2012   Procedure:  LUMBAR LAMINECTOMY/DECOMPRESSION MICRODISCECTOMY 1 LEVEL;  Surgeon: Charlie Pitter, MD;  Location: [facility];  Service: Neurosurgery;  Laterality: Right;  Right lumbar three-four microdiscectomy  . ORIF SHOULDER FRACTURE Right 06/02/2017   Procedure: OPEN REDUCTION INTERNAL FIXATION (ORIF) RIGHT GLENOID FRACTURE, POSSIBLE LATARJET CORACOID TRANSFER;  Surgeon: Meredith Pel, MD;  Location: [facility];  Service: Orthopedics;  Laterality: Right;  . TONSILLECTOMY    . TRACHEOSTOMY CLOSURE  1981    Current Medications: Current Meds  Medication Sig  . alfuzosin (UROXATRAL) 10 MG 24 hr tablet Take 1 tablet (10 mg total) by mouth daily with breakfast.  . amphetamine-dextroamphetamine (ADDERALL XR) 30 MG 24 hr capsule Take 1 capsule (30 mg total) by mouth every morning.  Marland Kitchen amphetamine-dextroamphetamine (ADDERALL) 20 MG tablet Take 1 tablet (20 mg total) by mouth daily.  Marland Kitchen atorvastatin (LIPITOR) 10 MG tablet TAKE 1 TABLET BY MOUTH EVERY DAY  . canagliflozin (INVOKANA) 300 MG TABS tablet Take 1 tablet (300 mg total) by mouth daily before breakfast.  . ELIQUIS 5 MG TABS tablet TAKE 1 TABLET BY MOUTH TWICE DAILY  . Insulin Glargine, 1 Unit Dial, (TOUJEO SOLOSTAR) 300 UNIT/ML SOPN Inject 40 Units into the skin daily.  . Insulin Pen Needle (PEN NEEDLES) 32G X 6 MM MISC 1 pen by Does not apply route daily.  . metFORMIN (GLUCOPHAGE) 1000 MG tablet TAKE 1 TABLET TWICE A DAY WITH A MEAL  . metoprolol tartrate (LOPRESSOR) 25 MG tablet TAKE 1 TABLET(25 MG) BY MOUTH TWICE DAILY  . omeprazole (PRILOSEC) 20 MG capsule TAKE ONE CAPSULE BY MOUTH EVERY DAY     Allergies:   Cephalexin   Social History   Socioeconomic History  . Marital status: Married    Spouse name: Not on file  . Number of children: 2  . Years of education: Not on file  . Highest education level: Not on file  Occupational History  . Occupation: Best boy: [employer]  . Financial resource strain:  Not on file  . Food insecurity    Worry: Not on file    Inability: Not on file  . Transportation needs    Medical: Not on file    Non-medical: Not on file  Tobacco Use  . Smoking status: Former Smoker    Years: 34.00    Types: Cigarettes    Quit date: 2007    Years since quitting: 13.5  . Smokeless tobacco: Former Systems developer    Types: [place] date: 02/15/2007  Substance and Sexual Activity  . Alcohol use: Yes    Comment: 3 glasses of wine per week   . Drug use: No  . Sexual activity: Yes    Partners: Male  Lifestyle  . Physical activity    Days per week: Not on file    Minutes per session: Not on file  . Stress: Not on file  Relationships  . Social connections  Talks on phone: Not on file    Gets together: Not on file    Attends religious service: Not on file    Active member of club or organization: Not on file    Attends meetings of clubs or organizations: Not on file    Relationship status: Not on file  Other Topics Concern  . Not on file  Social History Narrative   Regular exercise-yes     Family History: The patient's family history includes COPD in his mother; Cancer in his paternal grandmother; Diabetes in his maternal aunt and maternal aunt; Heart attack in his father.  ROS:   ROS Please see the history of present illness.     All other systems reviewed and are negative.  EKGs/Labs/Other Studies Reviewed:    The following studies were reviewed today:   EKG:  EKG is  ordered today.  The ekg ordered today is personally reviewed and demonstrates sinus bradycardia normal EKG 08/07/2018 atrial fibrillation controlled ventricular rate, previous EKGs in care everywhere reviewed all sinus rhythm Recent Labs: 01/22/2018: ALT 23; TSH 2.30 08/07/2018: BUN 20; Creatinine, Ser 0.88; Hemoglobin 17.0; Platelets 177.0; Potassium 4.7; Pro B Natriuretic peptide (BNP) 103.0; Sodium 136  Recent Lipid Panel    Component Value Date/Time   CHOL 96 01/22/2018 0915   TRIG  61.0 01/22/2018 0915   HDL 35.60 (L) 01/22/2018 0915   CHOLHDL 3 01/22/2018 0915   VLDL 12.2 01/22/2018 0915   LDLCALC 49 01/22/2018 0915    Physical Exam:    VS:  BP 114/72 (BP Location: Left Arm, Patient Position: Sitting, Cuff Size: Normal)   Pulse 68   Temp 97.7 F (36.5 C)   Ht 5\' 7"  (1.702 m)   Wt 198 lb 1.9 oz (89.9 kg)   SpO2 95%   BMI 31.03 kg/m     Wt Readings from Last 3 Encounters:  09/04/18 198 lb 1.9 oz (89.9 kg)  08/30/18 195 lb (88.5 kg)  08/07/18 195 lb (88.5 kg)     GEN: He has several scars from his tonsil and carotid surgery tracheostomy and shoulder surgery well nourished, well developed in no acute distress HEENT: Normal NECK: No JVD; No carotid bruits LYMPHATICS: No lymphadenopathy CARDIAC: Distant heart sounds RRR, no murmurs, rubs, gallops RESPIRATORY:  Clear to auscultation barrel chested ABDOMEN: Soft, non-tender, non-distended MUSCULOSKELETAL:  No edema; No deformity  SKIN: Warm and dry NEUROLOGIC:  Alert and oriented x 3 PSYCHIATRIC:  Normal affect     Signed, Shirlee More, MD  09/04/2018 4:31 PM    [facility]

## 2018-09-04 ENCOUNTER — Encounter: Payer: Self-pay | Admitting: Cardiology

## 2018-09-04 ENCOUNTER — Other Ambulatory Visit: Payer: Self-pay

## 2018-09-04 ENCOUNTER — Ambulatory Visit (INDEPENDENT_AMBULATORY_CARE_PROVIDER_SITE_OTHER): Payer: 59 | Admitting: Cardiology

## 2018-09-04 ENCOUNTER — Ambulatory Visit (HOSPITAL_BASED_OUTPATIENT_CLINIC_OR_DEPARTMENT_OTHER)
Admission: RE | Admit: 2018-09-04 | Discharge: 2018-09-04 | Disposition: A | Discharge status: Home / Self Care | Payer: 59 | Source: Ambulatory Visit | Attending: Cardiology | Admitting: Cardiology

## 2018-09-04 VITALS — BP 114/72 | HR 68 | Temp 97.7°F | Ht 67.0 in | Wt 198.1 lb

## 2018-09-04 DIAGNOSIS — I480 Paroxysmal atrial fibrillation: Secondary | ICD-10-CM

## 2018-09-04 DIAGNOSIS — R0602 Shortness of breath: Secondary | ICD-10-CM | POA: Insufficient documentation

## 2018-09-04 DIAGNOSIS — E089 Diabetes mellitus due to underlying condition without complications: Secondary | ICD-10-CM | POA: Diagnosis not present

## 2018-09-04 DIAGNOSIS — Z7901 Long term (current) use of anticoagulants: Secondary | ICD-10-CM

## 2018-09-04 MED ORDER — METOPROLOL TARTRATE 25 MG PO TABS: 12.5000 mg | ORAL_TABLET | Freq: Two times a day (BID) | ORAL | 1 refills | Status: DC

## 2018-09-04 MED ORDER — FLECAINIDE ACETATE 50 MG PO TABS: 50.0000 mg | ORAL_TABLET | Freq: Two times a day (BID) | ORAL | 2 refills | Status: DC

## 2018-09-04 NOTE — Patient Instructions (Addendum)
Medication Instructions:  Your physician has recommended you make the following change in your medication:   START: Flecaninde 50 mg : Take 1 tab BID  DECREASE: Metoprolol 25 mg: Take 1/2 tab twice daily  If you need a refill on your cardiac medications before your next appointment, please call your pharmacy.   Lab work: None If you have labs (blood work) drawn today and your tests are completely normal, you will receive your results only by: Marland Kitchen MyChart Message (if you have MyChart) OR . A paper copy in the mail If you have any lab test that is abnormal or we need to change your treatment, we will call you to review the results.  Testing/Procedures: Your physician has recommended that you wear a ZIO monitor. ZIO monitors are medical devices that record the heart's electrical activity. Doctors most often use these monitors to diagnose arrhythmias. Arrhythmias are problems with the speed or rhythm of the heartbeat. The monitor is a small, portable device. You can wear one while you do your normal daily activities. This is usually used to diagnose what is causing palpitations/syncope (passing out).  WEAR 7 DAYS  A chest x-ray takes a picture of the organs and structures inside the chest, including the heart, lungs, and blood vessels. This test can show several things, including, whether the heart is enlarges; whether fluid is building up in the lungs; and whether pacemaker / defibrillator leads are still in place.  Follow-Up: At [facility], you and your health needs are our priority.  As part of our continuing mission to provide you with exceptional heart care, we have created designated Provider Care Teams.  These Care Teams include your primary Cardiologist (physician) and Advanced Practice Providers (APPs -  Physician Assistants and Nurse Practitioners) who all work together to provide you with the care you need, when you need it. You will need a follow up appointment in 4 weeks.  Any  Other Special Instructions Will Be Listed Below (If Applicable).

## 2018-09-06 ENCOUNTER — Other Ambulatory Visit: Payer: Self-pay | Admitting: Internal Medicine

## 2018-09-10 ENCOUNTER — Other Ambulatory Visit (INDEPENDENT_AMBULATORY_CARE_PROVIDER_SITE_OTHER): Payer: 59

## 2018-09-10 DIAGNOSIS — I480 Paroxysmal atrial fibrillation: Secondary | ICD-10-CM

## 2018-09-10 NOTE — Progress Notes (Signed)
Cardiology Office Note:    Date:  09/11/2018   ID:  Michael, Li 04/17/1955, MRN [id number]  PCP:  Janith Lima, MD  Cardiologist:  Shirlee More, MD    Referring MD: Janith Lima, MD    ASSESSMENT:    1. Paroxysmal atrial fibrillation (HCC)   2. Chronic anticoagulation    PLAN:    In order of problems listed above:  1. Stable continue anticoagulant beta-blocker flecainide 2. Continue anticoagulant   Next appointment: 6 months   Medication Adjustments/Labs and Tests Ordered: Current medicines are reviewed at length with the patient today.  Concerns regarding medicines are outlined above.  No orders of the defined types were placed in this encounter.  No orders of the defined types were placed in this encounter.   No chief complaint on file.   History of Present Illness:    Michael Li is a 63 y.o. male with a hx of paroxysmal atrial fibrillation and shortness of breath last seen 09/04/2018.  At the time of the visit he was in sinus rhythm  He had a recent pharmacologic myocardial perfusion study 08/30/2018 ejection fraction 62% with normal perfusion.  Echocardiogram 11/28/2016 showed an ejection fraction 55 to 60% mild concentric left ventricular hypertrophy mild left atrial enlargement mild tricuspid regurgitation with normal pulmonary artery systolic pressure.  A proBNP level performed 08/07/2018 for shortness of breath was quite low at 103 making heart failure an unlikely diagnosis   He has a history of paroxysmal atrial fibrillation dating back to 2018 and says about half of the time his pulse is irregular he has palpitation with that.  He has never had an attempt to maintain sinus rhythm  and will place him on low-dose flecainide.  His liver disease is improved he tolerates his anticoagulant.  His clinical problem is exertional shortness of breath when he is outdoors climbing stairs is been present for a year and has just been persistent and  bothersome.   Compliance with diet, lifestyle and medications: Yes  He tolerates Galen [place] has had no recurrent atrial fibrillation he is pleased with the quality of his response.  He is wearing a ZIO monitor I encouraged him about the iPhone adapter.  Tolerates his anticoagulant without bleeding no palpitation shortness of breath chest pain or syncope EKG today is normal QRS has not progressed no signs of flecainide toxicity Past Medical History:  Diagnosis Date  . Anemia    rec'd 32 units of blood, post T&A, 1981  . ATTENTION DEFICIT HYPERACTIVITY DISORDER 07/19/2006  . BENIGN PROSTATIC HYPERTROPHY 07/19/2006  . DIABETES MELLITUS, TYPE II 07/19/2006  . Dyspnea    had shortness of breath when heart was irregular  . Dysrhythmia    history of a-fib  . ERECTILE DYSFUNCTION 07/19/2006  . GERD 07/19/2006  . HEPATITIS C, CHRONIC 04/03/2007   Treated with Harboni   . History of blood transfusion   . NEOPLASM, MALIGNANT, CARCINOMA, BASAL CELL, NOSE 10/20/2009   Basal cell    Past Surgical History:  Procedure Laterality Date  . APPENDECTOMY    . carotid injury       post tonsillectomy, R carotid injury with trach  . HERNIA REPAIR  1973   inguinal- R side  . KNEE SURGERY Right   . LUMBAR LAMINECTOMY/DECOMPRESSION MICRODISCECTOMY Right 09/12/2012   Procedure: LUMBAR LAMINECTOMY/DECOMPRESSION MICRODISCECTOMY 1 LEVEL;  Surgeon: Charlie Pitter, MD;  Location: [facility];  Service: Neurosurgery;  Laterality: Right;  Right lumbar three-four microdiscectomy  .  ORIF SHOULDER FRACTURE Right 06/02/2017   Procedure: OPEN REDUCTION INTERNAL FIXATION (ORIF) RIGHT GLENOID FRACTURE, POSSIBLE LATARJET CORACOID TRANSFER;  Surgeon: Meredith Pel, MD;  Location: [facility];  Service: Orthopedics;  Laterality: Right;  . TONSILLECTOMY    . TRACHEOSTOMY CLOSURE  1981    Current Medications: Current Meds  Medication Sig  . alfuzosin (UROXATRAL) 10 MG 24 hr tablet Take 1 tablet (10 mg total) by mouth daily with  breakfast.  . amphetamine-dextroamphetamine (ADDERALL XR) 30 MG 24 hr capsule Take 1 capsule (30 mg total) by mouth every morning.  Marland Kitchen amphetamine-dextroamphetamine (ADDERALL) 20 MG tablet Take 1 tablet (20 mg total) by mouth daily.  Marland Kitchen atorvastatin (LIPITOR) 10 MG tablet TAKE 1 TABLET BY MOUTH EVERY DAY  . canagliflozin (INVOKANA) 300 MG TABS tablet Take 1 tablet (300 mg total) by mouth daily before breakfast.  . ELIQUIS 5 MG TABS tablet TAKE 1 TABLET BY MOUTH TWICE DAILY  . flecainide (TAMBOCOR) 50 MG tablet Take 1 tablet (50 mg total) by mouth 2 (two) times daily.  . Insulin Glargine, 1 Unit Dial, (TOUJEO SOLOSTAR) 300 UNIT/ML SOPN Inject 40 Units into the skin daily.  . Insulin Pen Needle (PEN NEEDLES) 32G X 6 MM MISC 1 pen by Does not apply route daily.  . metFORMIN (GLUCOPHAGE) 1000 MG tablet TAKE 1 TABLET TWICE A DAY WITH A MEAL  . metoprolol tartrate (LOPRESSOR) 25 MG tablet Take 0.5 tablets (12.5 mg total) by mouth 2 (two) times daily.  Marland Kitchen omeprazole (PRILOSEC) 20 MG capsule TAKE ONE CAPSULE BY MOUTH EVERY DAY     Allergies:   Cephalexin   Social History   Socioeconomic History  . Marital status: Married    Spouse name: Not on file  . Number of children: 2  . Years of education: Not on file  . Highest education level: Not on file  Occupational History  . Occupation: Best boy: [employer]  . Financial resource strain: Not on file  . Food insecurity    Worry: Not on file    Inability: Not on file  . Transportation needs    Medical: Not on file    Non-medical: Not on file  Tobacco Use  . Smoking status: Former Smoker    Years: 34.00    Types: Cigarettes    Quit date: 2007    Years since quitting: 13.5  . Smokeless tobacco: Former Systems developer    Types: Melvin date: 02/15/2007  Substance and Sexual Activity  . Alcohol use: Yes    Comment: 3 glasses of wine per week   . Drug use: No  . Sexual activity: Yes    Partners: Male   Lifestyle  . Physical activity    Days per week: Not on file    Minutes per session: Not on file  . Stress: Not on file  Relationships  . Social Herbalist on phone: Not on file    Gets together: Not on file    Attends religious service: Not on file    Active member of club or organization: Not on file    Attends meetings of clubs or organizations: Not on file    Relationship status: Not on file  Other Topics Concern  . Not on file  Social History Narrative   Regular exercise-yes     Family History: The patient's family history includes COPD in his mother; Cancer in his paternal grandmother; Diabetes in  his maternal aunt and maternal aunt; Heart attack in his father. ROS:   Please see the history of present illness.    All other systems reviewed and are negative.  EKGs/Labs/Other Studies Reviewed:    The following studies were reviewed today:  EKG:  EKG ordered today and personally reviewed.  The ekg ordered today demonstrates sinus rhythm normal no increase in QRS duration  Chest x-ray 09/04/2018: IMPRESSION: No acute abnormality of the lungs.  No focal airspace opacity.  Recent Labs: 01/22/2018: ALT 23; TSH 2.30 08/07/2018: BUN 20; Creatinine, Ser 0.88; Hemoglobin 17.0; Platelets 177.0; Potassium 4.7; Pro B Natriuretic peptide (BNP) 103.0; Sodium 136  Recent Lipid Panel    Component Value Date/Time   CHOL 96 01/22/2018 0915   TRIG 61.0 01/22/2018 0915   HDL 35.60 (L) 01/22/2018 0915   CHOLHDL 3 01/22/2018 0915   VLDL 12.2 01/22/2018 0915   LDLCALC 49 01/22/2018 0915    Physical Exam:    VS:  BP 134/74 (BP Location: Right Arm, Patient Position: Sitting, Cuff Size: Normal)   Pulse 68   Temp 98.1 F (36.7 C)   Ht 5\' 7"  (1.702 m)   Wt 197 lb (89.4 kg)   SpO2 94%   BMI 30.85 kg/m     Wt Readings from Last 3 Encounters:  09/11/18 197 lb (89.4 kg)  09/04/18 198 lb 1.9 oz (89.9 kg)  08/30/18 195 lb (88.5 kg)     GEN:  Well nourished, well developed  in no acute distress HEENT: Normal NECK: No JVD; No carotid bruits LYMPHATICS: No lymphadenopathy CARDIAC: RRR, no murmurs, rubs, gallops RESPIRATORY:  Clear to auscultation without rales, wheezing or rhonchi  ABDOMEN: Soft, non-tender, non-distended MUSCULOSKELETAL:  No edema; No deformity  SKIN: Warm and dry NEUROLOGIC:  Alert and oriented x 3 PSYCHIATRIC:  Normal affect    Signed, Shirlee More, MD  09/11/2018 12:10 PM    [facility]

## 2018-09-11 ENCOUNTER — Ambulatory Visit (INDEPENDENT_AMBULATORY_CARE_PROVIDER_SITE_OTHER): Payer: 59 | Admitting: Cardiology

## 2018-09-11 ENCOUNTER — Encounter: Payer: Self-pay | Admitting: Cardiology

## 2018-09-11 ENCOUNTER — Ambulatory Visit: Payer: 59 | Admitting: Cardiology

## 2018-09-11 ENCOUNTER — Other Ambulatory Visit: Payer: Self-pay

## 2018-09-11 VITALS — BP 134/74 | HR 68 | Temp 98.1°F | Ht 67.0 in | Wt 197.0 lb

## 2018-09-11 DIAGNOSIS — I480 Paroxysmal atrial fibrillation: Secondary | ICD-10-CM

## 2018-09-11 DIAGNOSIS — Z7901 Long term (current) use of anticoagulants: Secondary | ICD-10-CM

## 2018-09-11 NOTE — Patient Instructions (Addendum)
Medication Instructions:  Your physician recommends that you continue on your current medications as directed. Please refer to the Current Medication list given to you today.  If you need a refill on your cardiac medications before your next appointment, please call your pharmacy.   Lab work: None  If you have labs (blood work) drawn today and your tests are completely normal, you will receive your results only by: Marland Kitchen MyChart Message (if you have MyChart) OR . A paper copy in the mail If you have any lab test that is abnormal or we need to change your treatment, we will call you to review the results.  Testing/Procedures: You had an EKG today.   Follow-Up: At [facility], you and your health needs are our priority.  As part of our continuing mission to provide you with exceptional heart care, we have created designated Provider Care Teams.  These Care Teams include your primary Cardiologist (physician) and Advanced Practice Providers (APPs -  Physician Assistants and Nurse Practitioners) who all work together to provide you with the care you need, when you need it. You will need a follow up appointment in 6 months.  Please call our office 2 months in advance to schedule this appointment.        KardiaMobile [email or web address]        FDA-cleared, clinical grade mobile EKG monitor: Jodelle Red is the most clinically-validated mobile EKG used by the world's leading cardiac care medical professionals With Basic service, know instantly if your heart rhythm is normal or if atrial fibrillation is detected, and email the last single EKG recording to yourself or your doctor Premium service, available for purchase through the Kardia app for $9.99 per month or $99 per year, includes unlimited history and storage of your EKG recordings, a monthly EKG summary report to share with your doctor, along with the ability to track your blood pressure, activity and weight  Includes one KardiaMobile phone clip FREE SHIPPING: Standard delivery 1-3 business days. Orders placed by 11:00am PST will ship that afternoon. Otherwise, will ship next business day. All orders ship via [employer] from [place], [place]

## 2018-10-02 ENCOUNTER — Telehealth: Payer: Self-pay

## 2018-10-02 DIAGNOSIS — I4891 Unspecified atrial fibrillation: Secondary | ICD-10-CM

## 2018-10-02 NOTE — Addendum Note (Signed)
Addended by: Stevan Born on: 10/02/2018 01:06 PM   Modules accepted: Orders

## 2018-10-02 NOTE — Telephone Encounter (Signed)
Patient notified of results of monitor and that he is still having atrial fibrillation.  Patient advised that Dr Bettina Gavia would like him to see Dr Curt Bears regarding these issues.  Patient agreed to plan.  Referral entered for Dr Curt Bears.  Patient verbalized understanding.

## 2018-10-03 ENCOUNTER — Encounter: Payer: Self-pay | Admitting: Internal Medicine

## 2018-10-03 ENCOUNTER — Other Ambulatory Visit: Payer: Self-pay | Admitting: Internal Medicine

## 2018-10-03 DIAGNOSIS — F900 Attention-deficit hyperactivity disorder, predominantly inattentive type: Secondary | ICD-10-CM

## 2018-10-03 MED ORDER — AMPHETAMINE-DEXTROAMPHET ER 30 MG PO CP24: 30.0000 mg | ORAL_CAPSULE | ORAL | 0 refills | Status: DC

## 2018-10-03 NOTE — Telephone Encounter (Signed)
Check [place] registry last filled 09/03/2018.Marland KitchenJohny Li

## 2018-10-09 ENCOUNTER — Other Ambulatory Visit: Payer: Self-pay | Admitting: Internal Medicine

## 2018-10-09 DIAGNOSIS — E118 Type 2 diabetes mellitus with unspecified complications: Secondary | ICD-10-CM

## 2018-10-09 MED ORDER — TOUJEO SOLOSTAR 300 UNIT/ML ~~LOC~~ SOPN: 40.0000 [IU] | PEN_INJECTOR | Freq: Every day | SUBCUTANEOUS | 1 refills | Status: DC

## 2018-10-24 ENCOUNTER — Encounter: Payer: Self-pay | Admitting: Cardiology

## 2018-10-24 ENCOUNTER — Ambulatory Visit (INDEPENDENT_AMBULATORY_CARE_PROVIDER_SITE_OTHER): Payer: 59 | Admitting: Cardiology

## 2018-10-24 ENCOUNTER — Other Ambulatory Visit: Payer: Self-pay

## 2018-10-24 VITALS — BP 106/72 | HR 64 | Ht 67.0 in | Wt 197.2 lb

## 2018-10-24 DIAGNOSIS — I480 Paroxysmal atrial fibrillation: Secondary | ICD-10-CM

## 2018-10-24 MED ORDER — FLECAINIDE ACETATE 100 MG PO TABS: 100.0000 mg | ORAL_TABLET | Freq: Two times a day (BID) | ORAL | 3 refills | Status: DC

## 2018-10-24 NOTE — Patient Instructions (Signed)
Medication Instructions:  Your physician has recommended you make the following change in your medication:  1. INCREASE Flecainide to 100 mg twice a day  * If you need a refill on your cardiac medications before your next appointment, please call your pharmacy.   Labwork: None ordered  Testing/Procedures: None ordered  Follow-Up: Your physician recommends that you schedule a follow-up appointment in: 1-2 weeks for nurse visit EKG.  Your physician recommends that you schedule a follow-up appointment in: 3 months with Dr. Curt Bears.   Thank you for choosing [facility]!!   Trinidad Curet, RN [phone]  Any Other Special Instructions Will Be Listed Below (If Applicable).

## 2018-10-24 NOTE — Progress Notes (Signed)
Electrophysiology Office Note   Date:  10/24/2018   ID:  Michael, Li 24-Dec-1955, MRN [id number]  PCP:  Michael Lima, MD  Cardiologist:  Michael Li Primary Electrophysiologist:  Michael Orlick Meredith Leeds, MD    Chief Complaint: palpitations   History of Present Illness: Michael Li is a 63 y.o. male who is being seen today for the evaluation of atrial fibrillation at the request of Michael Li Michael Cork, MD. Presenting today for electrophysiology evaluation.  He has a history significant for type 2 diabetes, hyperlipidemia, hepatitis C, and paroxysmal atrial fibrillation.  His atrial fibrillation was diagnosed in 2018.  He was started on low-dose flecainide.  He does have significant liver disease, but he has tolerated his anticoagulation.  He does have exertional shortness of breath when he is outdoors and climbing stairs.  Today, he denies symptoms of palpitations, chest pain, shortness of breath, orthopnea, PND, lower extremity edema, claudication, dizziness, presyncope, syncope, bleeding, or neurologic sequela. The patient is tolerating medications without difficulties.  He is in sinus rhythm today.  He says that his shortness of breath is intermittent.   Past Medical History:  Diagnosis Date  . Anemia    rec'd 32 units of blood, post T&A, 1981  . ATTENTION DEFICIT HYPERACTIVITY DISORDER 07/19/2006  . BENIGN PROSTATIC HYPERTROPHY 07/19/2006  . DIABETES MELLITUS, TYPE II 07/19/2006  . Dyspnea    had shortness of breath when heart was irregular  . Dysrhythmia    history of a-fib  . ERECTILE DYSFUNCTION 07/19/2006  . GERD 07/19/2006  . HEPATITIS C, CHRONIC 04/03/2007   Treated with Harboni   . History of blood transfusion   . NEOPLASM, MALIGNANT, CARCINOMA, BASAL CELL, NOSE 10/20/2009   Basal cell   Past Surgical History:  Procedure Laterality Date  . APPENDECTOMY    . carotid injury       post tonsillectomy, R carotid injury with trach  . HERNIA REPAIR  1973   inguinal- R side  .  KNEE SURGERY Right   . LUMBAR LAMINECTOMY/DECOMPRESSION MICRODISCECTOMY Right 09/12/2012   Procedure: LUMBAR LAMINECTOMY/DECOMPRESSION MICRODISCECTOMY 1 LEVEL;  Surgeon: Charlie Pitter, MD;  Location: [place];  Service: Neurosurgery;  Laterality: Right;  Right lumbar three-four microdiscectomy  . ORIF SHOULDER FRACTURE Right 06/02/2017   Procedure: OPEN REDUCTION INTERNAL FIXATION (ORIF) RIGHT GLENOID FRACTURE, POSSIBLE LATARJET CORACOID TRANSFER;  Surgeon: Meredith Pel, MD;  Location: [facility];  Service: Orthopedics;  Laterality: Right;  . TONSILLECTOMY    . TRACHEOSTOMY CLOSURE  1981     Current Outpatient Medications  Medication Sig Dispense Refill  . alfuzosin (UROXATRAL) 10 MG 24 hr tablet Take 1 tablet (10 mg total) by mouth daily with breakfast. 90 tablet 1  . amphetamine-dextroamphetamine (ADDERALL XR) 30 MG 24 hr capsule Take 1 capsule (30 mg total) by mouth every morning. 30 capsule 0  . amphetamine-dextroamphetamine (ADDERALL) 20 MG tablet Take 1 tablet (20 mg total) by mouth daily. 90 tablet 0  . atorvastatin (LIPITOR) 10 MG tablet TAKE 1 TABLET BY MOUTH EVERY DAY 90 tablet 1  . canagliflozin (INVOKANA) 300 MG TABS tablet Take 1 tablet (300 mg total) by mouth daily before breakfast. 90 tablet 1  . ELIQUIS 5 MG TABS tablet TAKE 1 TABLET BY MOUTH TWICE DAILY 180 tablet 1  . Insulin Glargine, 1 Unit Dial, (TOUJEO SOLOSTAR) 300 UNIT/ML SOPN Inject 40 Units into the skin daily. 4.5 mL 1  . Insulin Pen Needle (PEN NEEDLES) 32G X 6 MM MISC 1  pen by Does not apply route daily. 100 each 1  . metFORMIN (GLUCOPHAGE) 1000 MG tablet TAKE 1 TABLET TWICE A DAY WITH A MEAL 180 tablet 1  . metoprolol tartrate (LOPRESSOR) 25 MG tablet Take 0.5 tablets (12.5 mg total) by mouth 2 (two) times daily. 180 tablet 1  . omeprazole (PRILOSEC) 20 MG capsule TAKE ONE CAPSULE BY MOUTH EVERY DAY 90 capsule 1  . flecainide (TAMBOCOR) 100 MG tablet Take 1 tablet (100 mg total) by mouth 2 (two) times daily. 60  tablet 3   No current facility-administered medications for this visit.     Allergies:   Cephalexin   Social History:  The patient  reports that he quit smoking about 13 years ago. His smoking use included cigarettes. He quit after 34.00 years of use. He quit smokeless tobacco use about 11 years ago.  His smokeless tobacco use included chew. He reports current alcohol use. He reports that he does not use drugs.   Family History:  The patient's family history includes COPD in his mother; Cancer in his paternal grandmother; Diabetes in his maternal aunt and maternal aunt; Heart attack in his father.    ROS:  Please see the history of present illness.   Otherwise, review of systems is positive for none.   All other systems are reviewed and negative.    PHYSICAL EXAM: VS:  BP 106/72   Pulse 64   Ht 5\' 7"  (1.702 m)   Wt 197 lb 3.2 oz (89.4 kg)   SpO2 98%   BMI 30.89 kg/m  , BMI Body mass index is 30.89 kg/m. GEN: Well nourished, well developed, in no acute distress  HEENT: normal  Neck: no JVD, carotid bruits, or masses Cardiac: RRR; no murmurs, rubs, or gallops,no edema  Respiratory:  clear to auscultation bilaterally, normal work of breathing GI: soft, nontender, nondistended, + BS MS: no deformity or atrophy  Skin: warm and dry Neuro:  Strength and sensation are intact Psych: euthymic mood, full affect  EKG:  EKG is ordered today. Personal review of the ekg ordered shows sinus rhythm  Recent Labs: 01/22/2018: ALT 23; TSH 2.30 08/07/2018: BUN 20; Creatinine, Ser 0.88; Hemoglobin 17.0; Platelets 177.0; Potassium 4.7; Pro B Natriuretic peptide (BNP) 103.0; Sodium 136    Lipid Panel     Component Value Date/Time   CHOL 96 01/22/2018 0915   TRIG 61.0 01/22/2018 0915   HDL 35.60 (L) 01/22/2018 0915   CHOLHDL 3 01/22/2018 0915   VLDL 12.2 01/22/2018 0915   LDLCALC 49 01/22/2018 0915     Wt Readings from Last 3 Encounters:  10/24/18 197 lb 3.2 oz (89.4 kg)  09/11/18 197  lb (89.4 kg)  09/04/18 198 lb 1.9 oz (89.9 kg)      Other studies Reviewed: Additional studies/ records that were reviewed today include: TTE 11/2016  Review of the above records today demonstrates:  - Left ventricle: The cavity size was normal. There was mild   concentric hypertrophy. Systolic function was normal. The   estimated ejection fraction was in the range of 55% to 60%. Wall   motion was normal; there were no regional wall motion   abnormalities. Features are consistent with a pseudonormal left   ventricular filling pattern, with concomitant abnormal relaxation   and increased filling pressure (grade 2 diastolic dysfunction).   Doppler parameters are consistent with high ventricular filling   pressure. - Aortic valve: Transvalvular velocity was within the normal range.   There was no stenosis.  There was no regurgitation. - Mitral valve: Transvalvular velocity was within the normal range.   There was no evidence for stenosis. There was no regurgitation. - Left atrium: The atrium was mildly dilated. - Right ventricle: The cavity size was normal. Wall thickness was   normal. Systolic function was normal. - Atrial septum: No defect or patent foramen ovale was identified   by color flow Doppler. - Tricuspid valve: There was mild regurgitation. - Pulmonary arteries: Systolic pressure was within the normal   range. PA peak pressure: 35 mm Hg (S).  Zio 10/01/18 personally reviewed Sinus rhythm was present 49% of the time with minimum average and maximum heart rates of 53, 78 and 114 bpm. Atrial fibrillation was present 51% of the time with minimum average and maximum heart rates of 48, 86 174 bpm. There were no pauses of 3 seconds or greater and no episodes of sinus node or AV block. There are 5 triggered events  4 atrial fibrillation and 1 with ventricular premature contractions.   ASSESSMENT AND PLAN:  1.  Paroxysmal atrial fibrillation: Currently on flecainide, metoprolol,  and Eliquis.  He wore a ZIO patch that showed 50% atrial fibrillation burden.  He is short of breath and I do think his atrial fibrillation is contributing to his shortness of breath.  He is on a small dose of flecainide.  We Kellin Bartling plan to increase that today.  If he does continue to have episodes of atrial fibrillation, would potentially consider Tikosyn versus ablation.  This patients CHA2DS2-VASc Score and unadjusted Ischemic Stroke Rate (% per year) is equal to 0.6 % stroke rate/year from a score of 1  Above score calculated as 1 point each if present [CHF, HTN, DM, Vascular=MI/PAD/Aortic Plaque, Age if 65-74, or Male] Above score calculated as 2 points each if present [Age > 75, or Stroke/TIA/TE]  2.  Hyperlipidemia: Continue atorvastatin per primary physician.    Current medicines are reviewed at length with the patient today.   The patient does not have concerns regarding his medicines.  The following changes were made today:  none  Labs/ tests ordered today include:  Orders Placed This Encounter  Procedures  . EKG 12-Lead   Case discussed with referring cardiologist  Disposition:   FU with Encarnacion Bole 3 months  Signed, Stamatia Masri Meredith Leeds, MD  10/24/2018 9:22 AM     [facility] [address] [place] [address] [phone] (office) [phone] (fax)

## 2018-11-01 ENCOUNTER — Other Ambulatory Visit: Payer: Self-pay | Admitting: Internal Medicine

## 2018-11-01 ENCOUNTER — Encounter: Payer: Self-pay | Admitting: Internal Medicine

## 2018-11-01 DIAGNOSIS — F900 Attention-deficit hyperactivity disorder, predominantly inattentive type: Secondary | ICD-10-CM

## 2018-11-01 MED ORDER — AMPHETAMINE-DEXTROAMPHET ER 30 MG PO CP24: 30.0000 mg | ORAL_CAPSULE | ORAL | 0 refills | Status: DC

## 2018-11-01 MED ORDER — AMPHETAMINE-DEXTROAMPHETAMINE 20 MG PO TABS: 20.0000 mg | ORAL_TABLET | Freq: Every day | ORAL | 0 refills | Status: DC

## 2018-11-08 ENCOUNTER — Telehealth: Payer: Self-pay

## 2018-11-08 ENCOUNTER — Other Ambulatory Visit: Payer: Self-pay

## 2018-11-08 ENCOUNTER — Ambulatory Visit (INDEPENDENT_AMBULATORY_CARE_PROVIDER_SITE_OTHER): Payer: 59 | Admitting: *Deleted

## 2018-11-08 VITALS — BP 140/82 | HR 79 | Ht 67.0 in | Wt 197.0 lb

## 2018-11-08 DIAGNOSIS — I4891 Unspecified atrial fibrillation: Secondary | ICD-10-CM

## 2018-11-08 DIAGNOSIS — Z79899 Other long term (current) drug therapy: Secondary | ICD-10-CM | POA: Diagnosis not present

## 2018-11-08 NOTE — Telephone Encounter (Signed)
Forwarding to Dr. Curt Bears for his FYI & advisement.

## 2018-11-08 NOTE — Progress Notes (Signed)
1.) Reason for visit: EKG for increase in Flecainide per Dr. Curt Bears  2.) Name of MD requesting visit: Dr. Curt Bears  3.) H&P: Pt here for nurse visit today as ordered by Dr. Curt Bears, for increase in Flecainide in reference to his atrial fibrillation.  Pts EKG was done and showed to the DOD Dr. Lovena Le, who signed off on the pts EKG and made no further medication changes.  EKG showed he is still in afib with a HR-79 bpm. Dr. Lovena Le suggested routing this information to Dr. Curt Bears and RN, for further follow-up and treatment plan for this pt.  Pt made aware of this and that Dr. Curt Bears RN will be following up with him, once further recommendations recieved.   4.) ROS related to problem: Pt here for EKG for increase in Flecainide made by Dr. Curt Bears on 9/9, for noted afib and ongoing sob.  DOD Dr. Lovena Le reviewed and signed off on EKG, with no new med changes to be made.   5.) Assessment and plan per MD: per DOD Dr. Lovena Le, he reviewed EKG, no new med changes made at this time, forward this visit to ordering Provider Dr. Curt Bears and RN for further review and further treatment plan.

## 2018-11-08 NOTE — Telephone Encounter (Signed)
Patient came in today for nurse visit EKG. Dr. Curt Bears increased his Flecainide to 100MG  twice a day on 10/24/18. Patient is still in atrial fibrillation today, he states that he can not tell he is in A-fib, but he is short of breath and it is hard for him to do anything especially if he tries to exert himself. He would like to discuss if there are any options, like cardioversion? Karlene Einstein is documenting nurse encounter and will send to you as well.

## 2018-11-08 NOTE — Patient Instructions (Signed)
Medication Instructions:   Your physician recommends that you continue on your current medications as directed. Please refer to the Current Medication list given to you today.  If you need a refill on your cardiac medications before your next appointment, please call your pharmacy.      Follow-Up:  As planned with Dr. Curt Bears on 01/29/19 at 8:30 am  Also we will get this information and EKG to Dr. Curt Bears and Venida Jarvis RN, for further follow-up and treatment plan, based on your EKG findings from today.

## 2018-11-09 NOTE — Telephone Encounter (Signed)
Needs referral to AF clinic to discuss cardioversion.

## 2018-11-09 NOTE — Telephone Encounter (Signed)
Pt aware of recommendation. Will forward to AFib clinic to arrange OV

## 2018-11-14 ENCOUNTER — Other Ambulatory Visit: Payer: Self-pay

## 2018-11-14 ENCOUNTER — Encounter (HOSPITAL_COMMUNITY): Payer: Self-pay | Admitting: Nurse Practitioner

## 2018-11-14 ENCOUNTER — Ambulatory Visit (HOSPITAL_COMMUNITY)
Admission: RE | Admit: 2018-11-14 | Discharge: 2018-11-14 | Disposition: A | Discharge status: Home / Self Care | Payer: 59 | Source: Ambulatory Visit | Attending: Nurse Practitioner | Admitting: Nurse Practitioner

## 2018-11-14 VITALS — BP 118/62 | HR 83 | Ht 67.0 in | Wt 197.2 lb

## 2018-11-14 DIAGNOSIS — I4892 Unspecified atrial flutter: Secondary | ICD-10-CM | POA: Diagnosis present

## 2018-11-14 DIAGNOSIS — Z833 Family history of diabetes mellitus: Secondary | ICD-10-CM | POA: Insufficient documentation

## 2018-11-14 DIAGNOSIS — Z881 Allergy status to other antibiotic agents status: Secondary | ICD-10-CM | POA: Diagnosis not present

## 2018-11-14 DIAGNOSIS — K219 Gastro-esophageal reflux disease without esophagitis: Secondary | ICD-10-CM | POA: Diagnosis not present

## 2018-11-14 DIAGNOSIS — B182 Chronic viral hepatitis C: Secondary | ICD-10-CM | POA: Insufficient documentation

## 2018-11-14 DIAGNOSIS — Z7901 Long term (current) use of anticoagulants: Secondary | ICD-10-CM | POA: Insufficient documentation

## 2018-11-14 DIAGNOSIS — N529 Male erectile dysfunction, unspecified: Secondary | ICD-10-CM | POA: Insufficient documentation

## 2018-11-14 DIAGNOSIS — Z79899 Other long term (current) drug therapy: Secondary | ICD-10-CM | POA: Insufficient documentation

## 2018-11-14 DIAGNOSIS — Z87891 Personal history of nicotine dependence: Secondary | ICD-10-CM | POA: Insufficient documentation

## 2018-11-14 DIAGNOSIS — E119 Type 2 diabetes mellitus without complications: Secondary | ICD-10-CM | POA: Diagnosis not present

## 2018-11-14 DIAGNOSIS — N400 Enlarged prostate without lower urinary tract symptoms: Secondary | ICD-10-CM | POA: Diagnosis not present

## 2018-11-14 DIAGNOSIS — Z794 Long term (current) use of insulin: Secondary | ICD-10-CM | POA: Diagnosis not present

## 2018-11-14 DIAGNOSIS — I4891 Unspecified atrial fibrillation: Secondary | ICD-10-CM | POA: Insufficient documentation

## 2018-11-14 DIAGNOSIS — Z8249 Family history of ischemic heart disease and other diseases of the circulatory system: Secondary | ICD-10-CM | POA: Insufficient documentation

## 2018-11-14 NOTE — Patient Instructions (Signed)
Cardioversion scheduled for Friday, October 23rd  - Come to clinic for labs/ekg at 9:30am  - Arrive at the [facility] and go to admitting at [place] not eat or drink anything after midnight the night prior to your procedure.  - Take all your morning medication except diabetic medication with a sip of water prior to arrival.  - You will not be able to drive home after your procedure.

## 2018-11-14 NOTE — Progress Notes (Signed)
Primary Care Physician: Janith Lima, MD Referring Physician: Dr. Mayra Neer Michael Li is a 63 y.o. male with a h/o paroxysmal afib since 2018. He initially saw Dr. Bettina Gavia in July and was started on Flecainide 50 mg bid. He saw Dr. Curt Bears in early September. Flecainde was increased to 100 mg bid and he returned for a repeat EKG. This showed afib, rate controlled and Dr. Curt Bears referred here for cardioversion.  Pt denies and significant alcohol, caffeine use, no tobacco use. He does not have symptoms of sleep apnea.He likes to daily walk his dog but has not been able to for some months for exertional dyspnea.  Today, he denies symptoms of palpitations, chest pain, shortness of breath, orthopnea, PND, lower extremity edema, dizziness, presyncope, syncope, or neurologic sequela. The patient is tolerating medications without difficulties and is otherwise without complaint today.   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   Basal cell   Past Surgical History:  Procedure Laterality Date   APPENDECTOMY     carotid injury       post tonsillectomy, R carotid injury with trach   HERNIA REPAIR  1973   inguinal- R side   KNEE SURGERY Right    LUMBAR LAMINECTOMY/DECOMPRESSION MICRODISCECTOMY Right 09/12/2012   Procedure: LUMBAR LAMINECTOMY/DECOMPRESSION MICRODISCECTOMY 1 LEVEL;  Surgeon: Charlie Pitter, MD;  Location: [facility];  Service: Neurosurgery;  Laterality: Right;  Right lumbar three-four  microdiscectomy   ORIF SHOULDER FRACTURE Right 06/02/2017   Procedure: OPEN REDUCTION INTERNAL FIXATION (ORIF) RIGHT GLENOID FRACTURE, POSSIBLE LATARJET CORACOID TRANSFER;  Surgeon: Meredith Pel, MD;  Location: [facility];  Service: Orthopedics;  Laterality: Right;   TONSILLECTOMY     TRACHEOSTOMY CLOSURE  1981    Current Outpatient Medications  Medication Sig Dispense Refill   alfuzosin (UROXATRAL) 10 MG 24 hr tablet Take 1 tablet (10 mg total) by mouth daily with breakfast. 90 tablet 1   amphetamine-dextroamphetamine (ADDERALL XR) 30 MG 24 hr capsule Take 1 capsule (30 mg total) by mouth every morning. 30 capsule 0   amphetamine-dextroamphetamine (ADDERALL) 20 MG tablet Take 1 tablet (20 mg total) by mouth daily. 90 tablet 0   atorvastatin (LIPITOR) 10 MG tablet TAKE 1 TABLET BY MOUTH EVERY DAY 90 tablet 1   canagliflozin (INVOKANA) 300 MG TABS tablet Take 1 tablet (300 mg total) by mouth daily before breakfast. 90 tablet 1   ELIQUIS 5 MG TABS tablet TAKE 1 TABLET BY MOUTH TWICE DAILY 180 tablet 1   flecainide (TAMBOCOR) 100 MG tablet Take 1 tablet (100 mg total) by mouth 2 (two) times daily. 60 tablet 3   Insulin Glargine, 1 Unit Dial, (TOUJEO SOLOSTAR) 300 UNIT/ML SOPN Inject 40 Units into the skin daily. 4.5 mL 1   metFORMIN (GLUCOPHAGE) 1000 MG tablet TAKE 1 TABLET TWICE A DAY WITH A MEAL 180 tablet 1   metoprolol tartrate (LOPRESSOR) 25 MG tablet Take 0.5 tablets (12.5 mg total)  by mouth 2 (two) times daily. 180 tablet 1   omeprazole (PRILOSEC) 20 MG capsule TAKE ONE CAPSULE BY MOUTH EVERY DAY 90 capsule 1   Insulin Pen Needle (PEN NEEDLES) 32G X 6 MM MISC 1 pen by Does not apply route daily. 100 each 1   No current facility-administered medications for this encounter.     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: Not on file   Food insecurity    Worry: Not on file    Inability: Not on file   Transportation needs    Medical: Not on file    Non-medical: Not on file  Tobacco Use   Smoking status: Former Smoker    Years: 34.00    Types: Cigarettes    Quit date: 2007    Years since quitting: 13.7   Smokeless tobacco: Former Systems developer    Types: [place] date: 02/15/2007  Substance and Sexual Activity   Alcohol use: Yes    Comment: 3 glasses of wine per week    Drug use: No   Sexual activity: Yes    Partners: Male  Lifestyle   Physical activity    Days per week: Not on file    Minutes per session: Not on file   Stress: Not on file  Relationships   Social connections    Talks on phone: Not on file    Gets together: Not on file    Attends religious service: Not on file    Active member of club or organization: Not on file    Attends meetings of clubs or organizations: Not on file    Relationship status: Not on file   Intimate partner violence    Fear of current or ex partner: Not on file    Emotionally abused: Not on file    Physically abused: Not on file    Forced sexual activity: Not on file  Other Topics Concern   Not on file  Social History Narrative   Regular exercise-yes    Family History  Problem Relation Age of Onset   COPD Mother    Heart attack Father    Diabetes Maternal Aunt    Cancer Paternal Grandmother        colon cancer   Diabetes Maternal Aunt     ROS- All systems are reviewed and negative except as per the HPI above  Physical Exam: Vitals:   11/14/18 0838  BP: 118/62  Pulse: 83  Weight: 89.4 kg  Height: 5\' 7"  (1.702 m)   Wt Readings from Last 3 Encounters:  11/14/18 89.4 kg    11/08/18 89.4 kg  10/24/18 89.4 kg    Labs: Lab Results  Component Value Date   NA 136 08/07/2018   K 4.7 08/07/2018   CL 101 08/07/2018   CO2 26 08/07/2018   GLUCOSE 201 (H) 08/07/2018   BUN 20 08/07/2018   CREATININE 0.88 08/07/2018   CALCIUM 9.8 08/07/2018   Lab Results  Component Value Date   INR 1.00 06/02/2017  Lab Results  Component Value Date   CHOL 96 01/22/2018   HDL 35.60 (L) 01/22/2018   LDLCALC 49 01/22/2018   TRIG 61.0 01/22/2018     GEN- The patient is well appearing, alert and oriented x 3 today.   Head- normocephalic, atraumatic Eyes-  Sclera clear, conjunctiva pink Ears- hearing intact Oropharynx- clear Neck- supple, no JVP Lymph- no cervical lymphadenopathy Lungs- Clear to ausculation bilaterally, normal work of breathing Heart- irregular rate and rhythm, no murmurs, rubs or gallops, PMI not laterally displaced GI- soft, NT, ND, + BS Extremities- no clubbing, cyanosis, or edema MS- no significant deformity or atrophy Skin- no rash or lesion Psych- euthymic mood, full affect Neuro- strength and sensation are intact  EKG-atrial flutter with variable block. Qrs int 110 ms, qtc 451 ms    Assessment and Plan: 1. Afib/flutter  Pt continues out of rhythm despite increase in Flecainide  Continue flecainide 100 mg bid  Continue  metoprolol 12.5 mg bid  General education re afib and triggers discussed He can not usually  tell if he is in  afib or not Kardia or apple  watch recommended to help pt track afib occurrence   2. CHA2DS2VASc score of 1  He missed a dose of eliquis 9/26 Therefore will not be eligible for cardioversion until after  10/18 Will scheduled for Friday 10/23 as pt would like a Friday Covid test 48 hours before Cbc/bmet/ekg am prior to cardioversion  F/u here in one week  Butch Penny C. Curley Hogen, [facility] [address] [place],  [address] [phone]

## 2018-12-03 ENCOUNTER — Encounter: Payer: Self-pay | Admitting: Internal Medicine

## 2018-12-04 ENCOUNTER — Other Ambulatory Visit (HOSPITAL_COMMUNITY)
Admission: RE | Admit: 2018-12-04 | Discharge: 2018-12-04 | Disposition: A | Discharge status: Home / Self Care | Payer: 59 | Source: Ambulatory Visit | Attending: Internal Medicine | Admitting: Internal Medicine

## 2018-12-04 DIAGNOSIS — Z20828 Contact with and (suspected) exposure to other viral communicable diseases: Secondary | ICD-10-CM | POA: Insufficient documentation

## 2018-12-04 DIAGNOSIS — Z01812 Encounter for preprocedural laboratory examination: Secondary | ICD-10-CM | POA: Insufficient documentation

## 2018-12-04 NOTE — Telephone Encounter (Signed)
Check [place] registry last filled 11/01/2018. MD is out of the office this week pls advise.Marland KitchenJohny Chess

## 2018-12-06 ENCOUNTER — Encounter (HOSPITAL_COMMUNITY): Payer: Self-pay | Admitting: Certified Registered Nurse Anesthetist

## 2018-12-06 LAB — NOVEL CORONAVIRUS, NAA (HOSP ORDER, SEND-OUT TO REF LAB; TAT 18-24 HRS): SARS-CoV-2, NAA: NOT DETECTED

## 2018-12-07 ENCOUNTER — Encounter (HOSPITAL_COMMUNITY): Payer: Self-pay

## 2018-12-07 ENCOUNTER — Encounter (HOSPITAL_COMMUNITY): Payer: 59 | Admitting: Nurse Practitioner

## 2018-12-07 ENCOUNTER — Encounter (HOSPITAL_COMMUNITY): Payer: Self-pay | Admitting: Anesthesiology

## 2018-12-07 ENCOUNTER — Encounter (HOSPITAL_COMMUNITY)
Admission: RE | Disposition: A | Discharge status: Home / Self Care | Payer: Self-pay | Source: Home / Self Care | Attending: Internal Medicine

## 2018-12-07 ENCOUNTER — Ambulatory Visit (HOSPITAL_COMMUNITY)
Admission: RE | Admit: 2018-12-07 | Discharge: 2018-12-07 | Disposition: A | Discharge status: Home / Self Care | Payer: 59 | Attending: Internal Medicine | Admitting: Internal Medicine

## 2018-12-07 DIAGNOSIS — E119 Type 2 diabetes mellitus without complications: Secondary | ICD-10-CM | POA: Diagnosis not present

## 2018-12-07 DIAGNOSIS — I480 Paroxysmal atrial fibrillation: Secondary | ICD-10-CM | POA: Insufficient documentation

## 2018-12-07 DIAGNOSIS — K219 Gastro-esophageal reflux disease without esophagitis: Secondary | ICD-10-CM | POA: Diagnosis not present

## 2018-12-07 DIAGNOSIS — Z79899 Other long term (current) drug therapy: Secondary | ICD-10-CM | POA: Insufficient documentation

## 2018-12-07 DIAGNOSIS — Z7984 Long term (current) use of oral hypoglycemic drugs: Secondary | ICD-10-CM | POA: Diagnosis not present

## 2018-12-07 DIAGNOSIS — Z7901 Long term (current) use of anticoagulants: Secondary | ICD-10-CM | POA: Insufficient documentation

## 2018-12-07 DIAGNOSIS — Z538 Procedure and treatment not carried out for other reasons: Secondary | ICD-10-CM | POA: Insufficient documentation

## 2018-12-07 DIAGNOSIS — Z888 Allergy status to other drugs, medicaments and biological substances status: Secondary | ICD-10-CM | POA: Insufficient documentation

## 2018-12-07 SURGERY — CANCELLED PROCEDURE

## 2018-12-07 NOTE — Progress Notes (Signed)
Pt arrived for scheduled OP Cardioversion. Pt noted to be in NSR. EKG obtained and given to Dr Debara Pickett who is at bedside to speak with pt. Procedure cancelled.

## 2018-12-07 NOTE — Anesthesia Preprocedure Evaluation (Deleted)
Anesthesia Evaluation  Patient identified by MRN, date of birth, ID band Patient awake    Reviewed: Allergy & Precautions, NPO status , Patient's Chart, lab work & pertinent test results, reviewed documented beta blocker date and time   Airway Mallampati: II  TM Distance: >3 FB Neck ROM: Full    Dental no notable dental hx. (+) Teeth Intact   Pulmonary shortness of breath and with exertion, former smoker,    Pulmonary exam normal breath sounds clear to auscultation       Cardiovascular hypertension, Pt. on medications and Pt. on home beta blockers + DOE  Normal cardiovascular exam+ dysrhythmias Atrial Fibrillation  Rhythm:Irregular Rate:Normal  Atrial flutter with variable block   Neuro/Psych PSYCHIATRIC DISORDERS ADHD   GI/Hepatic GERD  Controlled and Medicated,(+) Hepatitis -, C  Endo/Other  diabetes, Poorly Controlled, Type 2, Oral Hypoglycemic AgentsHyperlipidemia  Renal/GU    BPH ED    Musculoskeletal negative musculoskeletal ROS (+)   Abdominal Normal abdominal exam  (+)   Peds  Hematology  (+) anemia , Eliquis therapy- last dose this am   Anesthesia Other Findings   Reproductive/Obstetrics                             Anesthesia Physical Anesthesia Plan  ASA: III  Anesthesia Plan: General   Post-op Pain Management:    Induction: Intravenous  PONV Risk Score and Plan: 2 and Ondansetron, Propofol infusion and Treatment may vary due to age or medical condition  Airway Management Planned: Mask, Natural Airway and Nasal Cannula  Additional Equipment:   Intra-op Plan:   Post-operative Plan:   Informed Consent: I have reviewed the patients History and Physical, chart, labs and discussed the procedure including the risks, benefits and alternatives for the proposed anesthesia with the patient or authorized representative who has indicated his/her understanding and acceptance.      Dental advisory given  Plan Discussed with: CRNA and Surgeon  Anesthesia Plan Comments:         Anesthesia Quick Evaluation

## 2018-12-09 ENCOUNTER — Other Ambulatory Visit: Payer: Self-pay | Admitting: Internal Medicine

## 2018-12-09 DIAGNOSIS — F900 Attention-deficit hyperactivity disorder, predominantly inattentive type: Secondary | ICD-10-CM

## 2018-12-09 MED ORDER — AMPHETAMINE-DEXTROAMPHET ER 30 MG PO CP24: 30.0000 mg | ORAL_CAPSULE | ORAL | 0 refills | Status: DC

## 2018-12-09 MED ORDER — AMPHETAMINE-DEXTROAMPHETAMINE 20 MG PO TABS: 20.0000 mg | ORAL_TABLET | Freq: Every day | ORAL | 0 refills | Status: DC | PRN

## 2018-12-13 ENCOUNTER — Other Ambulatory Visit: Payer: Self-pay | Admitting: Internal Medicine

## 2018-12-13 DIAGNOSIS — F900 Attention-deficit hyperactivity disorder, predominantly inattentive type: Secondary | ICD-10-CM

## 2018-12-19 ENCOUNTER — Ambulatory Visit (HOSPITAL_COMMUNITY): Payer: 59 | Admitting: Nurse Practitioner

## 2018-12-21 ENCOUNTER — Encounter (HOSPITAL_COMMUNITY): Payer: Self-pay | Admitting: Nurse Practitioner

## 2018-12-21 ENCOUNTER — Other Ambulatory Visit: Payer: Self-pay

## 2018-12-21 ENCOUNTER — Ambulatory Visit (HOSPITAL_COMMUNITY)
Admission: RE | Admit: 2018-12-21 | Discharge: 2018-12-21 | Disposition: A | Discharge status: Home / Self Care | Payer: 59 | Source: Ambulatory Visit | Attending: Nurse Practitioner | Admitting: Nurse Practitioner

## 2018-12-21 VITALS — BP 114/74 | HR 68 | Ht 67.0 in | Wt 196.8 lb

## 2018-12-21 DIAGNOSIS — Z87891 Personal history of nicotine dependence: Secondary | ICD-10-CM | POA: Diagnosis not present

## 2018-12-21 DIAGNOSIS — I4892 Unspecified atrial flutter: Secondary | ICD-10-CM | POA: Diagnosis not present

## 2018-12-21 DIAGNOSIS — Z79899 Other long term (current) drug therapy: Secondary | ICD-10-CM | POA: Diagnosis not present

## 2018-12-21 DIAGNOSIS — F909 Attention-deficit hyperactivity disorder, unspecified type: Secondary | ICD-10-CM | POA: Insufficient documentation

## 2018-12-21 DIAGNOSIS — Z794 Long term (current) use of insulin: Secondary | ICD-10-CM | POA: Insufficient documentation

## 2018-12-21 DIAGNOSIS — K219 Gastro-esophageal reflux disease without esophagitis: Secondary | ICD-10-CM | POA: Insufficient documentation

## 2018-12-21 DIAGNOSIS — I4891 Unspecified atrial fibrillation: Secondary | ICD-10-CM

## 2018-12-21 DIAGNOSIS — E118 Type 2 diabetes mellitus with unspecified complications: Secondary | ICD-10-CM | POA: Insufficient documentation

## 2018-12-21 DIAGNOSIS — Z7901 Long term (current) use of anticoagulants: Secondary | ICD-10-CM | POA: Insufficient documentation

## 2018-12-21 NOTE — Addendum Note (Signed)
Encounter addended by: Sherran Needs, NP on: 12/21/2018 9:02 AM  Actions taken: Clinical Note Signed

## 2018-12-21 NOTE — Progress Notes (Addendum)
Primary Care Physician: Janith Lima, MD Referring Physician: Dr. Mayra Neer Michael Li is a 63 y.o. male with a h/o paroxysmal afib since 2018. He initially saw Dr. Bettina Gavia in July and was started on Flecainide 50 mg bid. He saw Dr. Curt Bears in early September. Flecainde was increased to 100 mg bid and he returned for a repeat EKG. This showed afib, rate controlled and Dr. Curt Bears referred here for cardioversion.  Pt denies and significant alcohol, caffeine use, no tobacco use. He does not have symptoms of sleep apnea.He likes to daily walk his dog but has not been able to for some months for exertional dyspnea.  F/u in afib clinic, 11/6. His flecainide was increased to 100 mg bid and he went for DCCV. Fortunately, he was in rhythm on presentation for DCCV and it was cancelled. He is continues in SR. He states that he feels no different in SR that he did in afib.   Today, he denies symptoms of palpitations, chest pain, shortness of breath, orthopnea, PND, lower extremity edema, dizziness, presyncope, syncope, or neurologic sequela. The patient is tolerating medications without difficulties and is otherwise without complaint today.   Past Medical History:  Diagnosis Date  . Anemia    rec'd 32 units of blood, post T&A, 1981  . ATTENTION DEFICIT HYPERACTIVITY DISORDER 07/19/2006  . BENIGN PROSTATIC HYPERTROPHY 07/19/2006  . DIABETES MELLITUS, TYPE II 07/19/2006  . Dyspnea    had shortness of breath when heart was irregular  . Dysrhythmia    history of a-fib  . ERECTILE DYSFUNCTION 07/19/2006  . GERD 07/19/2006  . HEPATITIS C, CHRONIC 04/03/2007   Treated with Harboni   . History of blood transfusion   . NEOPLASM, MALIGNANT, CARCINOMA, BASAL CELL, NOSE 10/20/2009   Basal cell   Past Surgical History:  Procedure Laterality Date  . APPENDECTOMY    . carotid injury       post tonsillectomy, R carotid injury with trach  . HERNIA REPAIR  1973   inguinal- R side  . KNEE SURGERY Right   .  LUMBAR LAMINECTOMY/DECOMPRESSION MICRODISCECTOMY Right 09/12/2012   Procedure: LUMBAR LAMINECTOMY/DECOMPRESSION MICRODISCECTOMY 1 LEVEL;  Surgeon: Charlie Pitter, MD;  Location: [facility] [facility];  Service: Neurosurgery;  Laterality: Right;  Right lumbar three-four microdiscectomy  . ORIF SHOULDER FRACTURE Right 06/02/2017   Procedure: OPEN REDUCTION INTERNAL FIXATION (ORIF) RIGHT GLENOID FRACTURE, POSSIBLE LATARJET CORACOID TRANSFER;  Surgeon: Meredith Pel, MD;  Location: [facility];  Service: Orthopedics;  Laterality: Right;  . TONSILLECTOMY    . TRACHEOSTOMY CLOSURE  1981    Current Outpatient Medications  Medication Sig Dispense Refill  . alfuzosin (UROXATRAL) 10 MG 24 hr tablet Take 1 tablet (10 mg total) by mouth daily with breakfast. 90 tablet 1  . amphetamine-dextroamphetamine (ADDERALL XR) 30 MG 24 hr capsule Take 1 capsule (30 mg total) by mouth every morning. 30 capsule 0  . amphetamine-dextroamphetamine (ADDERALL) 20 MG tablet Take 1 tablet (20 mg total) by mouth daily as needed (in the afternoon if needed for focus.). 30 tablet 0  . atorvastatin (LIPITOR) 10 MG tablet TAKE 1 TABLET BY MOUTH EVERY DAY (Patient taking differently: Take 10 mg by mouth daily. ) 90 tablet 1  . canagliflozin (INVOKANA) 300 MG TABS tablet Take 1 tablet (300 mg total) by mouth daily before breakfast. 90 tablet 1  . ELIQUIS 5 MG TABS tablet TAKE 1 TABLET BY MOUTH TWICE DAILY (Patient taking differently: Take 5 mg by mouth 2 (  two) times daily. ) 180 tablet 1  . flecainide (TAMBOCOR) 100 MG tablet Take 1 tablet (100 mg total) by mouth 2 (two) times daily. 60 tablet 3  . Insulin Glargine, 1 Unit Dial, (TOUJEO SOLOSTAR) 300 UNIT/ML SOPN Inject 40 Units into the skin daily. 4.5 mL 1  . Insulin Pen Needle (PEN NEEDLES) 32G X 6 MM MISC 1 pen by Does not apply route daily. 100 each 1  . metFORMIN (GLUCOPHAGE) 1000 MG tablet TAKE 1 TABLET TWICE A DAY WITH A MEAL (Patient taking differently: Take 1,000 mg by mouth 2 (two)  times daily. ) 180 tablet 1  . metoprolol tartrate (LOPRESSOR) 25 MG tablet Take 0.5 tablets (12.5 mg total) by mouth 2 (two) times daily. 180 tablet 1  . omeprazole (PRILOSEC) 20 MG capsule TAKE ONE CAPSULE BY MOUTH EVERY DAY (Patient taking differently: Take 20 mg by mouth every evening. ) 90 capsule 1   No current facility-administered medications for this encounter.     Allergies  Allergen Reactions  . Cephalexin Anaphylaxis, Swelling and Other (See Comments)    Swelling in throat    Social History   Socioeconomic History  . Marital status: Married    Spouse name: Not on file  . Number of children: 2  . Years of education: Not on file  . Highest education level: Not on file  Occupational History  . Occupation: Best boy: [employer]  . Financial resource strain: Not on file  . Food insecurity    Worry: Not on file    Inability: Not on file  . Transportation needs    Medical: Not on file    Non-medical: Not on file  Tobacco Use  . Smoking status: Former Smoker    Years: 34.00    Types: Cigarettes    Quit date: 2007    Years since quitting: 13.8  . Smokeless tobacco: Former Systems developer    Types: [place] date: 02/15/2007  Substance and Sexual Activity  . Alcohol use: Yes    Comment: 3 glasses of wine per week   . Drug use: No  . Sexual activity: Yes    Partners: Male  Lifestyle  . Physical activity    Days per week: Not on file    Minutes per session: Not on file  . Stress: Not on file  Relationships  . Social Herbalist on phone: Not on file    Gets together: Not on file    Attends religious service: Not on file    Active member of club or organization: Not on file    Attends meetings of clubs or organizations: Not on file    Relationship status: Not on file  . Intimate partner violence    Fear of current or ex partner: Not on file    Emotionally abused: Not on file    Physically abused: Not on file     Forced sexual activity: Not on file  Other Topics Concern  . Not on file  Social History Narrative   Regular exercise-yes    Family History  Problem Relation Age of Onset  . COPD Mother   . Heart attack Father   . Diabetes Maternal Aunt   . Cancer Paternal Grandmother        colon cancer  . Diabetes Maternal Aunt     ROS- All systems are reviewed and negative except as per the HPI above  Physical  Exam: Vitals:   12/21/18 0837  BP: 114/74  Pulse: 68  Weight: 89.3 kg  Height: 5\' 7"  (1.702 m)   Wt Readings from Last 3 Encounters:  12/21/18 89.3 kg  11/14/18 89.4 kg  11/08/18 89.4 kg    Labs: Lab Results  Component Value Date   NA 136 08/07/2018   K 4.7 08/07/2018   CL 101 08/07/2018   CO2 26 08/07/2018   GLUCOSE 201 (H) 08/07/2018   BUN 20 08/07/2018   CREATININE 0.88 08/07/2018   CALCIUM 9.8 08/07/2018   Lab Results  Component Value Date   INR 1.00 06/02/2017   Lab Results  Component Value Date   CHOL 96 01/22/2018   HDL 35.60 (L) 01/22/2018   LDLCALC 49 01/22/2018   TRIG 61.0 01/22/2018     GEN- The patient is well appearing, alert and oriented x 3 today.   Head- normocephalic, atraumatic Eyes-  Sclera clear, conjunctiva pink Ears- hearing intact Oropharynx- clear Neck- supple, no JVP Lymph- no cervical lymphadenopathy Lungs- Clear to ausculation bilaterally, normal work of breathing Heart- regular rate and rhythm, no murmurs, rubs or gallops, PMI not laterally displaced GI- soft, NT, ND, + BS Extremities- no clubbing, cyanosis, or edema MS- no significant deformity or atrophy Skin- no rash or lesion Psych- euthymic mood, full affect Neuro- strength and sensation are intact  EKG-SR with first degree AV block, pr int 238 ms, qrs int 104 ms, qtc 423 ms    Assessment and Plan: 1. Afib/flutter  Converted with flecainide 100 mg bid and did not require cardioversion Continue flecainide 100 mg bid  Continue  metoprolol 12.5 mg bid  He can not  usually  tell if he is in  afib or not, did buy a Kardia, does not feel any different in afib that he did in SR, he feels well  2. CHA2DS2VASc score of 1 ( DM) Continue eliquis 5 mg bid for now since he just had chemical cardioversion one week ago after being in afib for some time Will defer on f/u with Dr. Ileana Ladd if he can stop eliquis, by guidelines he should be able to  Co pay  card given for eliquis given   F/u with Dr. Curt Bears as scheduled 12/15  Butch Penny C. Shivan Hodes, [facility] [address] [place], [place] [address] [phone]

## 2019-01-14 ENCOUNTER — Other Ambulatory Visit: Payer: Self-pay | Admitting: Internal Medicine

## 2019-01-14 DIAGNOSIS — F900 Attention-deficit hyperactivity disorder, predominantly inattentive type: Secondary | ICD-10-CM

## 2019-01-14 MED ORDER — AMPHETAMINE-DEXTROAMPHET ER 30 MG PO CP24: 30.0000 mg | ORAL_CAPSULE | ORAL | 0 refills | Status: DC

## 2019-01-14 MED ORDER — AMPHETAMINE-DEXTROAMPHETAMINE 20 MG PO TABS: 20.0000 mg | ORAL_TABLET | Freq: Every day | ORAL | 0 refills | Status: DC | PRN

## 2019-01-18 ENCOUNTER — Other Ambulatory Visit: Payer: Self-pay | Admitting: Internal Medicine

## 2019-01-18 DIAGNOSIS — I480 Paroxysmal atrial fibrillation: Secondary | ICD-10-CM

## 2019-01-18 MED ORDER — METOPROLOL TARTRATE 25 MG PO TABS: 12.5000 mg | ORAL_TABLET | Freq: Two times a day (BID) | ORAL | 0 refills | Status: DC

## 2019-01-29 ENCOUNTER — Other Ambulatory Visit: Payer: Self-pay

## 2019-01-29 ENCOUNTER — Ambulatory Visit (INDEPENDENT_AMBULATORY_CARE_PROVIDER_SITE_OTHER): Payer: 59 | Admitting: Cardiology

## 2019-01-29 ENCOUNTER — Encounter: Payer: Self-pay | Admitting: Cardiology

## 2019-01-29 VITALS — BP 118/88 | HR 71 | Ht 68.0 in | Wt 195.0 lb

## 2019-01-29 DIAGNOSIS — Z01812 Encounter for preprocedural laboratory examination: Secondary | ICD-10-CM

## 2019-01-29 DIAGNOSIS — I480 Paroxysmal atrial fibrillation: Secondary | ICD-10-CM

## 2019-01-29 NOTE — Progress Notes (Signed)
Electrophysiology Office Note   Date:  01/29/2019   ID:  Michael Li 06-Oct-1955, MRN [id number]  PCP:  Janith Lima, MD  Cardiologist:  Bettina Gavia Primary Electrophysiologist:  Ekam Besson Meredith Leeds, MD    Chief Complaint: palpitations   History of Present Illness: Michael Li is a 63 y.o. male who is being seen today for the evaluation of atrial fibrillation at the request of Janith Lima, MD. Presenting today for electrophysiology evaluation.  He has a history significant for type 2 diabetes, hyperlipidemia, hepatitis C, and paroxysmal atrial fibrillation.  His atrial fibrillation was diagnosed in 2018.  He was started on low-dose flecainide.  He does have significant liver disease, but he has tolerated his anticoagulation.  He does have exertional shortness of breath when he is outdoors and climbing stairs.  Today, denies symptoms of palpitations, chest pain, orthopnea, PND, lower extremity edema, claudication, dizziness, presyncope, syncope, bleeding, or neurologic sequela. The patient is tolerating medications without difficulties.  He continues to have episodes of intermittent shortness of breath.  He is in atrial fibrillation today.  He does not have much in the way of weakness and fatigue, but his shortness of breath is mildly limiting.   Past Medical History:  Diagnosis Date  . Anemia    rec'd 32 units of blood, post T&A, 1981  . ATTENTION DEFICIT HYPERACTIVITY DISORDER 07/19/2006  . BENIGN PROSTATIC HYPERTROPHY 07/19/2006  . DIABETES MELLITUS, TYPE II 07/19/2006  . Dyspnea    had shortness of breath when heart was irregular  . Dysrhythmia    history of a-fib  . ERECTILE DYSFUNCTION 07/19/2006  . GERD 07/19/2006  . HEPATITIS C, CHRONIC 04/03/2007   Treated with [place]   . History of blood transfusion   . NEOPLASM, MALIGNANT, CARCINOMA, BASAL CELL, NOSE 10/20/2009   Basal cell   Past Surgical History:  Procedure Laterality Date  . APPENDECTOMY    . carotid  injury       post tonsillectomy, R carotid injury with trach  . HERNIA REPAIR  1973   inguinal- R side  . KNEE SURGERY Right   . LUMBAR LAMINECTOMY/DECOMPRESSION MICRODISCECTOMY Right 09/12/2012   Procedure: LUMBAR LAMINECTOMY/DECOMPRESSION MICRODISCECTOMY 1 LEVEL;  Surgeon: Charlie Pitter, MD;  Location: [facility];  Service: Neurosurgery;  Laterality: Right;  Right lumbar three-four microdiscectomy  . ORIF SHOULDER FRACTURE Right 06/02/2017   Procedure: OPEN REDUCTION INTERNAL FIXATION (ORIF) RIGHT GLENOID FRACTURE, POSSIBLE LATARJET CORACOID TRANSFER;  Surgeon: Meredith Pel, MD;  Location: [facility];  Service: Orthopedics;  Laterality: Right;  . TONSILLECTOMY    . TRACHEOSTOMY CLOSURE  1981     Current Outpatient Medications  Medication Sig Dispense Refill  . alfuzosin (UROXATRAL) 10 MG 24 hr tablet Take 1 tablet (10 mg total) by mouth daily with breakfast. 90 tablet 1  . amphetamine-dextroamphetamine (ADDERALL XR) 30 MG 24 hr capsule Take 1 capsule (30 mg total) by mouth every morning. 30 capsule 0  . amphetamine-dextroamphetamine (ADDERALL) 20 MG tablet Take 1 tablet (20 mg total) by mouth daily as needed (in the afternoon if needed for focus.). 30 tablet 0  . atorvastatin (LIPITOR) 10 MG tablet TAKE 1 TABLET BY MOUTH EVERY DAY (Patient taking differently: Take 10 mg by mouth daily. ) 90 tablet 1  . canagliflozin (INVOKANA) 300 MG TABS tablet Take 1 tablet (300 mg total) by mouth daily before breakfast. 90 tablet 1  . ELIQUIS 5 MG TABS tablet TAKE 1 TABLET BY MOUTH TWICE DAILY (Patient  taking differently: Take 5 mg by mouth 2 (two) times daily. ) 180 tablet 1  . flecainide (TAMBOCOR) 100 MG tablet Take 1 tablet (100 mg total) by mouth 2 (two) times daily. 60 tablet 3  . Insulin Glargine, 1 Unit Dial, (TOUJEO SOLOSTAR) 300 UNIT/ML SOPN Inject 40 Units into the skin daily. 4.5 mL 1  . Insulin Pen Needle (PEN NEEDLES) 32G X 6 MM MISC 1 pen by Does not apply route daily. 100 each 1  .  metFORMIN (GLUCOPHAGE) 1000 MG tablet TAKE 1 TABLET TWICE A DAY WITH A MEAL (Patient taking differently: Take 1,000 mg by mouth 2 (two) times daily. ) 180 tablet 1  . metoprolol tartrate (LOPRESSOR) 25 MG tablet Take 0.5 tablets (12.5 mg total) by mouth 2 (two) times daily. 180 tablet 0  . omeprazole (PRILOSEC) 20 MG capsule TAKE ONE CAPSULE BY MOUTH EVERY DAY (Patient taking differently: Take 20 mg by mouth every evening. ) 90 capsule 1   No current facility-administered medications for this visit.    Allergies:   Cephalexin   Social History:  The patient  reports that he quit smoking about 13 years ago. His smoking use included cigarettes. He quit after 34.00 years of use. He quit smokeless tobacco use about 11 years ago.  His smokeless tobacco use included chew. He reports current alcohol use. He reports that he does not use drugs.   Family History:  The patient's family history includes COPD in his mother; Cancer in his paternal grandmother; Diabetes in his maternal aunt and maternal aunt; Heart attack in his father.    ROS:  Please see the history of present illness.   Otherwise, review of systems is positive for none.   All other systems are reviewed and negative.   PHYSICAL EXAM: VS:  BP 118/88   Pulse 71   Ht 5\' 8"  (1.727 m)   Wt 195 lb (88.5 kg)   SpO2 97%   BMI 29.65 kg/m  , BMI Body mass index is 29.65 kg/m. GEN: Well nourished, well developed, in no acute distress  HEENT: normal  Neck: no JVD, carotid bruits, or masses Cardiac: irregular; no murmurs, rubs, or gallops,no edema  Respiratory:  clear to auscultation bilaterally, normal work of breathing GI: soft, nontender, nondistended, + BS MS: no deformity or atrophy  Skin: warm and dry Neuro:  Strength and sensation are intact Psych: euthymic mood, full affect  EKG:  EKG is ordered today. Personal review of the ekg ordered shows atrial fibrillation, rate 71  Recent Labs: 08/07/2018: BUN 20; Creatinine, Ser 0.88;  Hemoglobin 17.0; Platelets 177.0; Potassium 4.7; Pro B Natriuretic peptide (BNP) 103.0; Sodium 136    Lipid Panel     Component Value Date/Time   CHOL 96 01/22/2018 0915   TRIG 61.0 01/22/2018 0915   HDL 35.60 (L) 01/22/2018 0915   CHOLHDL 3 01/22/2018 0915   VLDL 12.2 01/22/2018 0915   LDLCALC 49 01/22/2018 0915     Wt Readings from Last 3 Encounters:  01/29/19 195 lb (88.5 kg)  12/21/18 196 lb 12.8 oz (89.3 kg)  11/14/18 197 lb 3.2 oz (89.4 kg)      Other studies Reviewed: Additional studies/ records that were reviewed today include: TTE 11/2016  Review of the above records today demonstrates:  - Left ventricle: The cavity size was normal. There was mild   concentric hypertrophy. Systolic function was normal. The   estimated ejection fraction was in the range of 55% to 60%. Wall  motion was normal; there were no regional wall motion   abnormalities. Features are consistent with a pseudonormal left   ventricular filling pattern, with concomitant abnormal relaxation   and increased filling pressure (grade 2 diastolic dysfunction).   Doppler parameters are consistent with high ventricular filling   pressure. - Aortic valve: Transvalvular velocity was within the normal range.   There was no stenosis. There was no regurgitation. - Mitral valve: Transvalvular velocity was within the normal range.   There was no evidence for stenosis. There was no regurgitation. - Left atrium: The atrium was mildly dilated. - Right ventricle: The cavity size was normal. Wall thickness was   normal. Systolic function was normal. - Atrial septum: No defect or patent foramen ovale was identified   by color flow Doppler. - Tricuspid valve: There was mild regurgitation. - Pulmonary arteries: Systolic pressure was within the normal   range. PA peak pressure: 35 mm Hg (S).  Zio 10/01/18 personally reviewed Sinus rhythm was present 49% of the time with minimum average and maximum heart rates of 53,  78 and 114 bpm. Atrial fibrillation was present 51% of the time with minimum average and maximum heart rates of 48, 86 174 bpm. There were no pauses of 3 seconds or greater and no episodes of sinus node or AV block. There are 5 triggered events  4 atrial fibrillation and 1 with ventricular premature contractions.   ASSESSMENT AND PLAN:  1.  Paroxysmal atrial fibrillation: Currently Zio patch showed 50% atrial fibrillation burden.  Currently on flecainide, Eliquis.  CHA2DS2-VASc of 1.  Fortunately, he continues to have shortness of breath likely caused by his atrial fibrillation.  Due to that, we Janavia Rottman plan for ablation.  Risks and benefits were discussed include bleeding, tamponade, heart block, stroke, damage surrounding organs.  He understands these risks and is agreed to the procedure.  2.  Hyperlipidemia: Continue atorvastatin per primary care.    Current medicines are reviewed at length with the patient today.   The patient does not have concerns regarding his medicines.  The following changes were made today: None  Labs/ tests ordered today include:  Orders Placed This Encounter  Procedures  . CT CARDIAC MORPH/PULM VEIN W/CM&W/O CA SCORE  . CT CORONARY FRACTIONAL FLOW RESERVE DATA PREP  . CT CORONARY FRACTIONAL FLOW RESERVE FLUID ANALYSIS  . Basic metabolic panel  . CBC  . EKG 12-Lead   Case discussed with referring cardiologist  Disposition:   FU with Karista Aispuro 30 months  Signed, Deverick Pruss Meredith Leeds, MD  01/29/2019 9:32 AM     [facility] [address]  [place] [address] [phone] (office) [phone] (fax)

## 2019-01-29 NOTE — Patient Instructions (Signed)
Medication Instructions:  Your physician recommends that you continue on your current medications as directed. Please refer to the Current Medication list given to you today.  *If you need a refill on your cardiac medications before your next appointment, please call your pharmacy.  Labwork: You will get lab work within 30 days of your procedure for:  BMP & CBC -- you are scheduled for 03/15/19  If you have labs (blood work) drawn today and your tests are completely normal, you will receive your results only by:  [place] (if you have MyChart) OR  A paper copy in the mail If you have any lab test that is abnormal or we need to change your treatment, we will call you to review the results.  Testing/Procedures: Your physician has requested that you have cardiac CT within 7 days prior to your ablation. Cardiac computed tomography (CT) is a painless test that uses an x-ray machine to take clear, detailed pictures of your heart. For further information please visit [email or web address]. Please follow instruction below located under special instructions. You will get a call from our office to schedule the date for this test.  Your physician has recommended that you have an ablation. Catheter ablation is a medical procedure used to treat some cardiac arrhythmias (irregular heartbeats). During catheter ablation, a long, thin, flexible tube is put into a blood vessel in your groin (upper thigh), or neck. This tube is called an ablation catheter. It is then guided to your heart through the blood vessel. Radio frequency waves destroy small areas of heart tissue where abnormal heartbeats may cause an arrhythmia to start. Please see the instructions below located under special instructions  Follow-Up: Your physician recommends that you schedule a follow-up appointment in: 4 weeks, after your procedure on 03/27/2019, with Michael Palau NP in the AFib clinic.  Your physician recommends that you schedule  a follow up appointment in: 3 months, after your procedure on 03/27/2019, with Michael Li.  *Please note that any paperwork needing to be filled out by the provider will need to be addressed at the front desk prior to seeing the provider. Please note that any FMLA, disability or other documents regarding health condition is subject to a $25.00 charge that must be received prior to completion of paperwork in the form of a money order or check.  Thank you for choosing [facility]!! Trinidad Curet, RN [phone]   Any Other Special Instructions Will Be Listed Below     CT INSTRUCTIONS Your cardiac CT will be scheduled at:   [facility] [address] [place], [place] [address] [phone]  Please arrive at the [facility] main entrance of [facility] at _____________. Please arrive 30-45 minutes prior to test start time. Proceed to the [facility] Radiology Department (first floor) to check-in and test prep.  Please follow these instructions carefully (unless otherwise directed):  Hold all erectile dysfunction medications at least 48 hours prior to test.  On the Night Before the Test: . Be sure to Drink plenty of water. . Do not consume any caffeinated/decaffeinated beverages or chocolate 12 hours prior to your test. . Do not take any antihistamines 12 hours prior to your test. . If you take Metformin do not take 24 hours prior to test. . If the patient has contrast allergy: ? Patient will need a prescription for Prednisone and very clear instructions (as follows): 1. Prednisone 50 mg - take 13 hours prior to test 2. Take another  Prednisone 50 mg 7 hours prior to test 3. Take another Prednisone 50 mg 1 hour prior to test 4. Take Benadryl 50 mg 1 hour prior to test . Patient must complete all four doses of above prophylactic medications. . Patient will need a ride after test due to Benadryl.  On the Day of the Test: . Drink plenty of water. Do not  drink any water within one hour of the test. . Do not eat any food 4 hours prior to the test. . You may take your regular medications prior to the test.  . Take metoprolol (Lopressor) two hours prior to test. . HOLD Furosemide/Hydrochlorothiazide morning of the test. . FEMALES- please wear underwire-free bra if available       After the Test: . Drink plenty of water. . After receiving IV contrast, you may experience a mild flushed feeling. This is normal. . On occasion, you may experience a mild rash up to 24 hours after the test. This is not dangerous. If this occurs, you can take Benadryl 25 mg and increase your fluid intake. . If you experience trouble breathing, this can be serious. If it is severe call 911 IMMEDIATELY. If it is mild, please call our office. . If you take any of these medications: Glipizide/Metformin, Avandament, Glucavance, please do not take 48 hours after completing test.  Once we have confirmed authorization from your insurance company, we will call you to set up a date and time for your test.   For non-scheduling related questions, please contact the cardiac imaging nurse navigator should you have any questions/concerns: Michael Bond, RN Navigator Cardiac Imaging [facility] [phone] Office      Electrophysiology/Ablation Procedure Instructions   You are scheduled for a(n)   ablation on 03/27/2019 with Dr. Allegra Lai.   1.   Pre procedure testing-             A.  LAB WORK --- On 03/15/2019 for your pre procedure blood work.                 B. COVID TEST-- On 03/23/2019 @ 9:00 am- You will go to [facility] ([place]) for your Covid testing.   This is a drive thru test site.  There will be multiple testing areas.  Be sure to share with the first checkpoint that you are there for pre-procedure/surgery testing. This will put you into the right (yellow) lane that leads to the PAT testing team. Stay in your  car and the nurse team will come to your car to test you.  After you are tested please go home and self quarantine until the day of your procedure.     2. On the day of your procedure 03/27/2019 you will go to [facility] [phone] [address]) at 9:30 am.  Dennis Bast will go to the main entrance A [employer]) and enter where the [employer] are.  Your driver will drop you off and you will head down the hallway to ADMITTING.  You may have one support person come in to the hospital with you.  They will be asked to wait in the waiting room.   3.   Do not eat or drink after midnight prior to your procedure.   4.   Do NOT take any medications the morning of your procedure.   5.  Plan for an overnight stay.  If you use your phone frequently bring your  phone Games developer.   6. You will follow up with the AFIB clinic 4 weeks after your procedure.  You will follow up with Michael Li  3 months after your procedure.  These appointments will be made for you.   * If you have ANY questions please call the office [phone] and ask for Michael Kronberg RN or send me a MyChart message   * Occasionally, EP Studies and ablations can become lengthy.  Please make your family aware of this before your procedure starts.  Average time ranges from 2-8 hours for EP studies/ablations.  Your physician will call your family after the procedure with the results.                                    Cardiac Ablation Cardiac ablation is a procedure to disable (ablate) a small amount of heart tissue in very specific places. The heart has many electrical connections. Sometimes these connections are abnormal and can cause the heart to beat very fast or irregularly. Ablating some of the problem areas can improve the heart rhythm or return it to normal. Ablation may be done for people who:  Have Wolff-Parkinson-White syndrome.  Have fast heart rhythms (tachycardia).  Have taken medicines for an abnormal heart rhythm (arrhythmia)  that were not effective or caused side effects.  Have a high-risk heartbeat that may be life-threatening. During the procedure, a small incision is made in the neck or the groin, and a long, thin, flexible tube (catheter) is inserted into the incision and moved to the heart. Small devices (electrodes) on the tip of the catheter will send out electrical currents. A type of X-ray (fluoroscopy) will be used to help guide the catheter and to provide images of the heart. Tell a health care provider about:  Any allergies you have.  All medicines you are taking, including vitamins, herbs, eye drops, creams, and over-the-counter medicines.  Any problems you or family members have had with anesthetic medicines.  Any blood disorders you have.  Any surgeries you have had.  Any medical conditions you have, such as kidney failure.  Whether you are pregnant or may be pregnant. What are the risks? Generally, this is a safe procedure. However, problems may occur, including:  Infection.  Bruising and bleeding at the catheter insertion site.  Bleeding into the chest, especially into the sac that surrounds the heart. This is a serious complication.  Stroke or blood clots.  Damage to other structures or organs.  Allergic reaction to medicines or dyes.  Need for a permanent pacemaker if the normal electrical system is damaged. A pacemaker is a small computer that sends electrical signals to the heart and helps your heart beat normally.  The procedure not being fully effective. This may not be recognized until months later. Repeat ablation procedures are sometimes required. What happens before the procedure?  Follow instructions from your health care provider about eating or drinking restrictions.  Ask your health care provider about: ? Changing or stopping your regular medicines. This is especially important if you are taking diabetes medicines or blood thinners. ? Taking medicines such as  aspirin and ibuprofen. These medicines can thin your blood. Do not take these medicines before your procedure if your health care provider instructs you not to.  Plan to have someone take you home from the hospital or clinic.  If you will be going home right after the procedure,  plan to have someone with you for 24 hours. What happens during the procedure?  To lower your risk of infection: ? Your health care team will wash or sanitize their hands. ? Your skin will be washed with soap. ? Hair may be removed from the incision area.  An IV tube will be inserted into one of your veins.  You will be given a medicine to help you relax (sedative).  The skin on your neck or groin will be numbed.  An incision will be made in your neck or your groin.  A needle will be inserted through the incision and into a large vein in your neck or groin.  A catheter will be inserted into the needle and moved to your heart.  Dye may be injected through the catheter to help your surgeon see the area of the heart that needs treatment.  Electrical currents will be sent from the catheter to ablate heart tissue in desired areas. There are three types of energy that may be used to ablate heart tissue: ? Heat (radiofrequency energy). ? Laser energy. ? Extreme cold (cryoablation).  When the necessary tissue has been ablated, the catheter will be removed.  Pressure will be held on the catheter insertion area to prevent excessive bleeding.  A bandage (dressing) will be placed over the catheter insertion area. The procedure may vary among health care providers and hospitals. What happens after the procedure?  Your blood pressure, heart rate, breathing rate, and blood oxygen level will be monitored until the medicines you were given have worn off.  Your catheter insertion area will be monitored for bleeding. You will need to lie still for a few hours to ensure that you do not bleed from the catheter insertion  area.  Do not drive for 24 hours or as long as directed by your health care provider. Summary  Cardiac ablation is a procedure to disable (ablate) a small amount of heart tissue in very specific places. Ablating some of the problem areas can improve the heart rhythm or return it to normal.  During the procedure, electrical currents will be sent from the catheter to ablate heart tissue in desired areas. This information is not intended to replace advice given to you by your health care provider. Make sure you discuss any questions you have with your health care provider. Document Released: 06/19/2008 Document Revised: 07/24/2017 Document Reviewed: 12/21/2015 Elsevier Patient Education  2020 [employer].

## 2019-02-03 ENCOUNTER — Other Ambulatory Visit: Payer: Self-pay | Admitting: Internal Medicine

## 2019-02-03 DIAGNOSIS — E118 Type 2 diabetes mellitus with unspecified complications: Secondary | ICD-10-CM

## 2019-02-05 ENCOUNTER — Other Ambulatory Visit: Payer: Self-pay | Admitting: Internal Medicine

## 2019-02-05 DIAGNOSIS — N401 Enlarged prostate with lower urinary tract symptoms: Secondary | ICD-10-CM

## 2019-02-05 MED ORDER — ALFUZOSIN HCL ER 10 MG PO TB24: 10.0000 mg | ORAL_TABLET | Freq: Every day | ORAL | 1 refills | Status: DC

## 2019-02-07 ENCOUNTER — Other Ambulatory Visit: Payer: Self-pay | Admitting: *Deleted

## 2019-02-07 DIAGNOSIS — E118 Type 2 diabetes mellitus with unspecified complications: Secondary | ICD-10-CM

## 2019-02-07 MED ORDER — TOUJEO SOLOSTAR 300 UNIT/ML ~~LOC~~ SOPN: 40.0000 [IU] | PEN_INJECTOR | Freq: Every day | SUBCUTANEOUS | 1 refills | Status: DC

## 2019-02-13 ENCOUNTER — Ambulatory Visit (INDEPENDENT_AMBULATORY_CARE_PROVIDER_SITE_OTHER): Payer: 59 | Admitting: Internal Medicine

## 2019-02-13 ENCOUNTER — Encounter: Payer: Self-pay | Admitting: Internal Medicine

## 2019-02-13 ENCOUNTER — Other Ambulatory Visit: Payer: Self-pay

## 2019-02-13 ENCOUNTER — Other Ambulatory Visit: Payer: Self-pay | Admitting: Internal Medicine

## 2019-02-13 VITALS — BP 136/78 | HR 71 | Temp 98.4°F | Resp 16 | Ht 68.0 in | Wt 196.1 lb

## 2019-02-13 DIAGNOSIS — F900 Attention-deficit hyperactivity disorder, predominantly inattentive type: Secondary | ICD-10-CM

## 2019-02-13 DIAGNOSIS — I480 Paroxysmal atrial fibrillation: Secondary | ICD-10-CM

## 2019-02-13 DIAGNOSIS — Z0000 Encounter for general adult medical examination without abnormal findings: Secondary | ICD-10-CM | POA: Diagnosis not present

## 2019-02-13 DIAGNOSIS — B182 Chronic viral hepatitis C: Secondary | ICD-10-CM

## 2019-02-13 DIAGNOSIS — E089 Diabetes mellitus due to underlying condition without complications: Secondary | ICD-10-CM

## 2019-02-13 DIAGNOSIS — I4821 Permanent atrial fibrillation: Secondary | ICD-10-CM

## 2019-02-13 DIAGNOSIS — E785 Hyperlipidemia, unspecified: Secondary | ICD-10-CM

## 2019-02-13 DIAGNOSIS — Z23 Encounter for immunization: Secondary | ICD-10-CM | POA: Diagnosis not present

## 2019-02-13 DIAGNOSIS — N401 Enlarged prostate with lower urinary tract symptoms: Secondary | ICD-10-CM | POA: Diagnosis not present

## 2019-02-13 DIAGNOSIS — E118 Type 2 diabetes mellitus with unspecified complications: Secondary | ICD-10-CM

## 2019-02-13 DIAGNOSIS — R3911 Hesitancy of micturition: Secondary | ICD-10-CM

## 2019-02-13 DIAGNOSIS — N5201 Erectile dysfunction due to arterial insufficiency: Secondary | ICD-10-CM

## 2019-02-13 MED ORDER — AMPHETAMINE-DEXTROAMPHETAMINE 20 MG PO TABS: 20.0000 mg | ORAL_TABLET | Freq: Every day | ORAL | 0 refills | Status: DC | PRN

## 2019-02-13 MED ORDER — AMPHETAMINE-DEXTROAMPHET ER 30 MG PO CP24: 30.0000 mg | ORAL_CAPSULE | ORAL | 0 refills | Status: DC

## 2019-02-13 MED ORDER — TADALAFIL 20 MG PO TABS: 20.0000 mg | ORAL_TABLET | Freq: Every day | ORAL | 5 refills | Status: DC | PRN

## 2019-02-13 NOTE — Telephone Encounter (Signed)
LVM for pt to call back as soon as possible to schedule appt to be seen.

## 2019-02-13 NOTE — Progress Notes (Addendum)
Subjective:  Patient ID: Michael Li, male    DOB: September 06, 1955  Age: 63 y.o. MRN: [id number]  CC: Annual Exam, Atrial Fibrillation, Hyperlipidemia, and Diabetes  This visit occurred during the SARS-CoV-2 public health emergency.  Safety protocols were in place, including screening questions prior to the visit, additional usage of staff PPE, and extensive cleaning of exam room while observing appropriate contact time as indicated for disinfecting solutions.    HPI [name] presents for a CPX.  He continues to complain of DOE.  He tells me his cardiologist feels like it is related to the atrial fibrillation so he is going to undergo cardiac ablation within the next few months.  He denies any recent episodes of chest pain, dizziness, lightheadedness, diaphoresis, or lower extremity edema.  Outpatient Medications Prior to Visit  Medication Sig Dispense Refill  . alfuzosin (UROXATRAL) 10 MG 24 hr tablet Take 1 tablet (10 mg total) by mouth daily with breakfast. 90 tablet 1  . amphetamine-dextroamphetamine (ADDERALL XR) 30 MG 24 hr capsule Take 1 capsule (30 mg total) by mouth every morning. 30 capsule 0  . amphetamine-dextroamphetamine (ADDERALL) 20 MG tablet Take 1 tablet (20 mg total) by mouth daily as needed (in the afternoon if needed for focus.). 30 tablet 0  . atorvastatin (LIPITOR) 10 MG tablet TAKE 1 TABLET BY MOUTH EVERY DAY (Patient taking differently: Take 10 mg by mouth daily. ) 90 tablet 1  . BD PEN NEEDLE MICRO U/F 32G X 6 MM MISC USE 2 PEN NEEDLES DAILY 200 each 3  . canagliflozin (INVOKANA) 300 MG TABS tablet Take 1 tablet (300 mg total) by mouth daily before breakfast. 90 tablet 1  . ELIQUIS 5 MG TABS tablet TAKE 1 TABLET BY MOUTH TWICE DAILY (Patient taking differently: Take 5 mg by mouth 2 (two) times daily. ) 180 tablet 1  . flecainide (TAMBOCOR) 100 MG tablet Take 1 tablet (100 mg total) by mouth 2 (two) times daily. 60 tablet 3  . metFORMIN (GLUCOPHAGE) 1000 MG  tablet TAKE 1 TABLET TWICE A DAY WITH A MEAL (Patient taking differently: Take 1,000 mg by mouth 2 (two) times daily. ) 180 tablet 1  . metoprolol tartrate (LOPRESSOR) 25 MG tablet Take 0.5 tablets (12.5 mg total) by mouth 2 (two) times daily. 180 tablet 0  . omeprazole (PRILOSEC) 20 MG capsule TAKE ONE CAPSULE BY MOUTH EVERY DAY (Patient taking differently: Take 20 mg by mouth every evening. ) 90 capsule 1  . Insulin Glargine, 1 Unit Dial, (TOUJEO SOLOSTAR) 300 UNIT/ML SOPN Inject 40 Units into the skin daily. 4.5 mL 1   No facility-administered medications prior to visit.    ROS Review of Systems  Constitutional: Negative.  Negative for diaphoresis, fatigue and unexpected weight change.  HENT: Negative.   Eyes: Negative for visual disturbance.  Respiratory: Positive for shortness of breath. Negative for cough, chest tightness and wheezing.   Cardiovascular: Negative for chest pain, palpitations and leg swelling.  Gastrointestinal: Negative for abdominal pain, blood in stool, constipation, diarrhea, nausea and vomiting.  Endocrine: Negative.  Negative for polydipsia, polyphagia and polyuria.  Genitourinary: Negative.  Negative for difficulty urinating, dysuria, hematuria, penile swelling, scrotal swelling, testicular pain and urgency.       +ED  Musculoskeletal: Negative.  Negative for arthralgias and myalgias.  Skin: Negative.  Negative for color change and pallor.  Neurological: Negative.  Negative for dizziness, weakness, light-headedness and numbness.  Hematological: Negative for adenopathy. Does not bruise/bleed easily.  Psychiatric/Behavioral: Positive  for decreased concentration. Negative for agitation, confusion, sleep disturbance and suicidal ideas. The patient is not nervous/anxious.     Objective:  BP 136/78 (BP Location: Left Arm, Patient Position: Sitting, Cuff Size: Normal)   Pulse 71   Temp 98.4 F (36.9 C) (Oral)   Resp 16   Ht 5\' 8"  (1.727 m)   Wt 196 lb 2 oz (89  kg)   SpO2 96%   BMI 29.82 kg/m   BP Readings from Last 3 Encounters:  02/13/19 136/78  01/29/19 118/88  12/21/18 114/74    Wt Readings from Last 3 Encounters:  02/13/19 196 lb 2 oz (89 kg)  01/29/19 195 lb (88.5 kg)  12/21/18 196 lb 12.8 oz (89.3 kg)    Physical Exam Vitals reviewed.  Constitutional:      Appearance: Normal appearance.  HENT:     Nose: Nose normal.     Mouth/Throat:     Mouth: Mucous membranes are moist.  Eyes:     General: No scleral icterus.    Conjunctiva/sclera: Conjunctivae normal.  Cardiovascular:     Rate and Rhythm: Normal rate. Rhythm irregularly irregular.     Pulses:          Carotid pulses are 1+ on the right side and 1+ on the left side.      Radial pulses are 1+ on the right side and 1+ on the left side.       Femoral pulses are 1+ on the right side and 1+ on the left side.      Popliteal pulses are 1+ on the right side and 1+ on the left side.       Dorsalis pedis pulses are 1+ on the right side and 1+ on the left side.       Posterior tibial pulses are 1+ on the right side and 1+ on the left side.     Heart sounds: Normal heart sounds. No murmur. No gallop.   Pulmonary:     Effort: Pulmonary effort is normal.     Breath sounds: No stridor. No wheezing, rhonchi or rales.  Abdominal:     General: Abdomen is protuberant. Bowel sounds are normal. There is no distension.     Palpations: Abdomen is soft. There is no hepatomegaly, splenomegaly or mass.     Tenderness: There is no abdominal tenderness.     Hernia: No hernia is present.  Genitourinary:    Pubic Area: No rash.      Penis: Normal. No discharge, swelling or lesions.      Testes: Normal.        Right: Mass or tenderness not present.        Left: Tenderness not present.     Epididymis:     Right: Normal. Not inflamed or enlarged. No mass.     Left: Not inflamed or enlarged. No mass.     Prostate: Enlarged (2+ smooth symm BPH). Not tender and no nodules present.     Rectum:  Normal. Guaiac result negative. No mass, tenderness, anal fissure, external hemorrhoid or internal hemorrhoid. Normal anal tone.  Musculoskeletal:     Cervical back: Neck supple.     Right lower leg: No edema.     Left lower leg: No edema.  Lymphadenopathy:     Cervical: No cervical adenopathy.  Skin:    General: Skin is warm and dry.     Findings: No rash.  Neurological:     General: No focal deficit present.  Mental Status: He is alert.  Psychiatric:        Mood and Affect: Mood normal.        Behavior: Behavior normal.     Lab Results  Component Value Date   WBC 6.9 02/13/2019   HGB 15.1 02/13/2019   HCT 45.2 02/13/2019   PLT 158.0 02/13/2019   GLUCOSE 86 02/13/2019   CHOL 117 02/13/2019   TRIG 68.0 02/13/2019   HDL 37.80 (L) 02/13/2019   LDLCALC 65 02/13/2019   ALT 31 02/13/2019   AST 26 02/13/2019   NA 139 02/13/2019   K 4.3 02/13/2019   CL 104 02/13/2019   CREATININE 1.00 02/13/2019   BUN 24 (H) 02/13/2019   CO2 26 02/13/2019   TSH 2.50 02/13/2019   PSA 1.24 02/13/2019   INR 1.00 06/02/2017   HGBA1C 6.2 02/13/2019   MICROALBUR 1.5 02/13/2019    No results found.  Assessment & Plan:   Orvell was seen today for annual exam, atrial fibrillation, hyperlipidemia and diabetes.  Diagnoses and all orders for this visit:  Paroxysmal atrial fibrillation ([place]) - This would now be characterized as permanent atrial fibrillation.  Will continue anticoagulation with the DOAC.  He will undergo ablation soon. -     TSH  Diabetes mellitus due to underlying condition, controlled, without complication, without long-term current use of insulin (HCC) - His A1c is down to 6.2%.  I have asked him to decrease his basal insulin dose by 50%. -     Basic metabolic panel -     HM Diabetes Foot Exam  Type II diabetes mellitus with manifestations ([place])- See above -     Basic metabolic panel -     Urinalysis, Routine w reflex microscopic -     Hemoglobin A1c -      Microalbumin / creatinine urine ratio -     CBC with Differential -     HM Diabetes Foot Exam -     Insulin Glargine, 1 Unit Dial, (TOUJEO SOLOSTAR) 300 UNIT/ML SOPN; Inject 20 Units into the skin daily.  Benign prostatic hyperplasia with urinary hesitancy - His PSA is low which is reassuring that he does not have prostate cancer.  He has no symptoms that need to be treated.  Routine general medical examination at a health care facility- Exam completed, labs reviewed, vaccines reviewed and updated, patient education was given. -     PSA -     HIV antibody (Reflex)  Hyperlipidemia LDL goal <100- He has achieved his LDL goal is doing well on the statin. -     Lipid panel -     CBC with Differential -     Hepatic function panel  Erectile dysfunction due to arterial insufficiency- His labs are negative for secondary causes.  I recommended that he try cialis to treat this. -     Testosterone Total,Free,Bio, Males -     tadalafil (CIALIS) 20 MG tablet; Take 1 tablet (20 mg total) by mouth daily as needed for erectile dysfunction.  Hep C w/ coma, chronic ([place])- The infection has been eradicated with Harvoni.  I have recommended that he get vaccinated against hepatitis A and B. -     Hepatitis A antibody, total -     Hepatitis B core antibody, total -     Hepatitis B surface antibody  Need for pneumococcal vaccination -     Pneumococcal polysaccharide vaccine 23-valent greater than or equal to 2yo subcutaneous/IM  Need for shingles  vaccine -     Varicella-zoster vaccine IM (Shingrix)  Permanent atrial fibrillation ([place])   I have changed Mikeal Hawthorne [employer] H[employer]. I am also having him start on tadalafil. Additionally, I am having him maintain his canagliflozin, atorvastatin, omeprazole, Eliquis, metFORMIN, flecainide, metoprolol tartrate, BD Pen Needle Micro U/F, alfuzosin, amphetamine-dextroamphetamine, and amphetamine-dextroamphetamine.  Meds ordered this encounter    Medications  . tadalafil (CIALIS) 20 MG tablet    Sig: Take 1 tablet (20 mg total) by mouth daily as needed for erectile dysfunction.    Dispense:  10 tablet    Refill:  5  . Insulin Glargine, 1 Unit Dial, (TOUJEO SOLOSTAR) 300 UNIT/ML SOPN    Sig: Inject 20 Units into the skin daily.    Dispense:  4.5 mL    Refill:  1     Follow-up: Return in about 6 months (around 08/14/2019).  Scarlette Calico, MD

## 2019-02-13 NOTE — Patient Instructions (Signed)

## 2019-02-14 ENCOUNTER — Encounter: Payer: Self-pay | Admitting: Internal Medicine

## 2019-02-14 LAB — HIV ANTIBODY (ROUTINE TESTING W REFLEX): HIV 1&2 Ab, 4th Generation: NONREACTIVE

## 2019-02-14 LAB — URINALYSIS, ROUTINE W REFLEX MICROSCOPIC
Bilirubin Urine: NEGATIVE
Hgb urine dipstick: NEGATIVE
Ketones, ur: NEGATIVE
Leukocytes,Ua: NEGATIVE
Nitrite: NEGATIVE
RBC / HPF: NONE SEEN (ref 0–?)
Specific Gravity, Urine: >=1.03 — AB (ref 1.000–1.030)
Total Protein, Urine: NEGATIVE
Urine Glucose: >=1000 — AB
Urobilinogen, UA: 0.2 (ref 0.0–1.0)
WBC, UA: NONE SEEN (ref 0–?)
pH: 5.5 (ref 5.0–8.0)

## 2019-02-14 LAB — CBC WITH DIFFERENTIAL/PLATELET
Basophils Absolute: 0 10*3/uL (ref 0.0–0.1)
Basophils Relative: 0.6 % (ref 0.0–3.0)
Eosinophils Absolute: 0.1 10*3/uL (ref 0.0–0.7)
Eosinophils Relative: 1.7 % (ref 0.0–5.0)
HCT: 45.2 % (ref 39.0–52.0)
Hemoglobin: 15.1 g/dL (ref 13.0–17.0)
Lymphocytes Relative: 20.2 % (ref 12.0–46.0)
Lymphs Abs: 1.4 10*3/uL (ref 0.7–4.0)
MCHC: 33.5 g/dL (ref 30.0–36.0)
MCV: 89.4 fl (ref 78.0–100.0)
Monocytes Absolute: 0.6 10*3/uL (ref 0.1–1.0)
Monocytes Relative: 8.9 % (ref 3.0–12.0)
Neutro Abs: 4.7 10*3/uL (ref 1.4–7.7)
Neutrophils Relative %: 68.6 % (ref 43.0–77.0)
Platelets: 158 10*3/uL (ref 150.0–400.0)
RBC: 5.05 Mil/uL (ref 4.22–5.81)
RDW: 13.2 % (ref 11.5–15.5)
WBC: 6.9 10*3/uL (ref 4.0–10.5)

## 2019-02-14 LAB — PSA: PSA: 1.24 ng/mL (ref 0.10–4.00)

## 2019-02-14 LAB — LIPID PANEL
Cholesterol: 117 mg/dL (ref 0–200)
HDL: 37.8 mg/dL — ABNORMAL LOW (ref >39.00)
LDL Cholesterol: 65 mg/dL (ref 0–99)
NonHDL: 78.95
Total CHOL/HDL Ratio: 3
Triglycerides: 68 mg/dL (ref 0.0–149.0)
VLDL: 13.6 mg/dL (ref 0.0–40.0)

## 2019-02-14 LAB — HEPATITIS B CORE ANTIBODY, TOTAL: Hep B Core Total Ab: NONREACTIVE

## 2019-02-14 LAB — BASIC METABOLIC PANEL
BUN: 24 mg/dL — ABNORMAL HIGH (ref 6–23)
CO2: 26 mEq/L (ref 19–32)
Calcium: 9.6 mg/dL (ref 8.4–10.5)
Chloride: 104 mEq/L (ref 96–112)
Creatinine, Ser: 1 mg/dL (ref 0.40–1.50)
GFR: 75.27 mL/min (ref >60.00)
Glucose, Bld: 86 mg/dL (ref 70–99)
Potassium: 4.3 mEq/L (ref 3.5–5.1)
Sodium: 139 mEq/L (ref 135–145)

## 2019-02-14 LAB — TESTOSTERONE TOTAL,FREE,BIO, MALES
Albumin: 4.4 g/dL (ref 3.6–5.1)
Sex Hormone Binding: 42 nmol/L (ref 22–77)
Testosterone, Bioavailable: 120 ng/dL (ref >=110.0)
Testosterone, Free: 59.6 pg/mL (ref 46.0–224.0)
Testosterone: 535 ng/dL (ref 250–827)

## 2019-02-14 LAB — HEPATITIS A ANTIBODY, TOTAL: Hepatitis A AB,Total: NONREACTIVE

## 2019-02-14 LAB — HEPATIC FUNCTION PANEL
ALT: 31 U/L (ref 0–53)
AST: 26 U/L (ref 0–37)
Albumin: 4.5 g/dL (ref 3.5–5.2)
Alkaline Phosphatase: 68 U/L (ref 39–117)
Bilirubin, Direct: 0.2 mg/dL (ref 0.0–0.3)
Total Bilirubin: 0.8 mg/dL (ref 0.2–1.2)
Total Protein: 6.6 g/dL (ref 6.0–8.3)

## 2019-02-14 LAB — MICROALBUMIN / CREATININE URINE RATIO
Creatinine,U: 143.9 mg/dL
Microalb Creat Ratio: 1 mg/g (ref 0.0–30.0)
Microalb, Ur: 1.5 mg/dL (ref 0.0–1.9)

## 2019-02-14 LAB — HEPATITIS B SURFACE ANTIBODY, QUANTITATIVE: Hep B S AB Quant (Post): <5 m[IU]/mL — ABNORMAL LOW (ref >=10)

## 2019-02-14 LAB — HEMOGLOBIN A1C: Hgb A1c MFr Bld: 6.2 % (ref 4.6–6.5)

## 2019-02-14 LAB — TSH: TSH: 2.5 u[IU]/mL (ref 0.35–4.50)

## 2019-02-17 ENCOUNTER — Encounter: Payer: Self-pay | Admitting: Internal Medicine

## 2019-02-17 DIAGNOSIS — Z23 Encounter for immunization: Secondary | ICD-10-CM | POA: Insufficient documentation

## 2019-02-17 DIAGNOSIS — I4821 Permanent atrial fibrillation: Secondary | ICD-10-CM | POA: Insufficient documentation

## 2019-02-17 MED ORDER — TOUJEO SOLOSTAR 300 UNIT/ML ~~LOC~~ SOPN: 20.0000 [IU] | PEN_INJECTOR | Freq: Every day | SUBCUTANEOUS | 1 refills | Status: DC

## 2019-02-18 ENCOUNTER — Other Ambulatory Visit: Payer: Self-pay | Admitting: Internal Medicine

## 2019-02-18 DIAGNOSIS — E118 Type 2 diabetes mellitus with unspecified complications: Secondary | ICD-10-CM

## 2019-02-18 MED ORDER — CANAGLIFLOZIN 300 MG PO TABS: 300.0000 mg | ORAL_TABLET | Freq: Every day | ORAL | 1 refills | Status: DC

## 2019-02-19 ENCOUNTER — Other Ambulatory Visit: Payer: Self-pay | Admitting: Internal Medicine

## 2019-02-19 DIAGNOSIS — N5201 Erectile dysfunction due to arterial insufficiency: Secondary | ICD-10-CM

## 2019-02-19 DIAGNOSIS — E785 Hyperlipidemia, unspecified: Secondary | ICD-10-CM

## 2019-02-19 DIAGNOSIS — E118 Type 2 diabetes mellitus with unspecified complications: Secondary | ICD-10-CM

## 2019-02-19 DIAGNOSIS — I4821 Permanent atrial fibrillation: Secondary | ICD-10-CM

## 2019-02-19 DIAGNOSIS — K219 Gastro-esophageal reflux disease without esophagitis: Secondary | ICD-10-CM

## 2019-02-19 MED ORDER — OMEPRAZOLE 20 MG PO CPDR: 20.0000 mg | DELAYED_RELEASE_CAPSULE | Freq: Every evening | ORAL | 1 refills | Status: DC

## 2019-02-19 MED ORDER — ATORVASTATIN CALCIUM 10 MG PO TABS: 10.0000 mg | ORAL_TABLET | Freq: Every day | ORAL | 1 refills | Status: DC

## 2019-02-19 MED ORDER — METFORMIN HCL 1000 MG PO TABS: 1000.0000 mg | ORAL_TABLET | Freq: Two times a day (BID) | ORAL | 1 refills | Status: DC

## 2019-02-19 MED ORDER — TADALAFIL 20 MG PO TABS: 20.0000 mg | ORAL_TABLET | Freq: Every day | ORAL | 5 refills | Status: DC | PRN

## 2019-02-19 MED ORDER — APIXABAN 5 MG PO TABS: 5.0000 mg | ORAL_TABLET | Freq: Two times a day (BID) | ORAL | 1 refills | Status: DC

## 2019-02-21 ENCOUNTER — Telehealth: Payer: Self-pay

## 2019-02-21 NOTE — Telephone Encounter (Signed)
Key: [id number]

## 2019-02-22 ENCOUNTER — Other Ambulatory Visit: Payer: Self-pay | Admitting: Cardiology

## 2019-03-01 ENCOUNTER — Other Ambulatory Visit: Payer: Self-pay | Admitting: Internal Medicine

## 2019-03-01 ENCOUNTER — Telehealth (INDEPENDENT_AMBULATORY_CARE_PROVIDER_SITE_OTHER): Payer: 59 | Admitting: Cardiology

## 2019-03-01 ENCOUNTER — Other Ambulatory Visit: Payer: Self-pay

## 2019-03-01 DIAGNOSIS — I480 Paroxysmal atrial fibrillation: Secondary | ICD-10-CM

## 2019-03-01 DIAGNOSIS — E118 Type 2 diabetes mellitus with unspecified complications: Secondary | ICD-10-CM

## 2019-03-01 NOTE — Progress Notes (Signed)
Electrophysiology TeleHealth Note   Due to national recommendations of social distancing due to COVID 19, an audio/video telehealth visit is felt to be most appropriate for this patient at this time.  See Epic message for the patient's consent to telehealth for [facility].   Date:  03/01/2019   ID:  Michael Li, DOB 05/19/1955, MRN [id number]  Location: patient's home  Provider location: [address], [place]  Evaluation Performed: Follow-up visit  PCP:  Janith Lima, MD  Cardiologist:  [facility] Electrophysiologist:  Dr Curt Bears  Chief Complaint:  AF  History of Present Illness:    Michael Li is a 64 y.o. male who presents via audio/video conferencing for a telehealth visit today.  Since last being seen in our clinic, the patient reports doing very well.  Today, he denies symptoms of palpitations, chest pain, shortness of breath,  lower extremity edema, dizziness, presyncope, or syncope.  The patient is otherwise without complaint today.  The patient denies symptoms of fevers, chills, cough, or new SOB worrisome for COVID 19.  The history of type 2 diabetes, hyperlipidemia, hepatitis C, and paroxysmal atrial fibrillation.  He is planned for AF ablation on 03/27/2019.  Today, denies symptoms of palpitations, chest pain, shortness of breath, orthopnea, PND, lower extremity edema, claudication, dizziness, presyncope, syncope, bleeding, or neurologic sequela. The patient is tolerating medications without difficulties.  Currently feels well without chest pain or shortness of breath.  He does have intermittent shortness of breath and fatigue that he feels is due to his atrial fibrillation.  Past Medical History:  Diagnosis Date  . Anemia    rec'd 32 units of blood, post T&A, 1981  . ATTENTION DEFICIT HYPERACTIVITY DISORDER 07/19/2006  . BENIGN PROSTATIC HYPERTROPHY 07/19/2006  . DIABETES MELLITUS, TYPE II 07/19/2006  . Dyspnea    had shortness of breath when  heart was irregular  . Dysrhythmia    history of a-fib  . ERECTILE DYSFUNCTION 07/19/2006  . GERD 07/19/2006  . HEPATITIS C, CHRONIC 04/03/2007   Treated with Harboni   . History of blood transfusion   . NEOPLASM, MALIGNANT, CARCINOMA, BASAL CELL, NOSE 10/20/2009   Basal cell    Past Surgical History:  Procedure Laterality Date  . APPENDECTOMY    . carotid injury       post tonsillectomy, R carotid injury with trach  . HERNIA REPAIR  1973   inguinal- R side  . KNEE SURGERY Right   . LUMBAR LAMINECTOMY/DECOMPRESSION MICRODISCECTOMY Right 09/12/2012   Procedure: LUMBAR LAMINECTOMY/DECOMPRESSION MICRODISCECTOMY 1 LEVEL;  Surgeon: Charlie Pitter, MD;  Location: [facility];  Service: Neurosurgery;  Laterality: Right;  Right lumbar three-four microdiscectomy  . ORIF SHOULDER FRACTURE Right 06/02/2017   Procedure: OPEN REDUCTION INTERNAL FIXATION (ORIF) RIGHT GLENOID FRACTURE, POSSIBLE LATARJET CORACOID TRANSFER;  Surgeon: Meredith Pel, MD;  Location: [facility];  Service: Orthopedics;  Laterality: Right;  . TONSILLECTOMY    . TRACHEOSTOMY CLOSURE  1981    Current Outpatient Medications  Medication Sig Dispense Refill  . alfuzosin (UROXATRAL) 10 MG 24 hr tablet Take 1 tablet (10 mg total) by mouth daily with breakfast. 90 tablet 1  . amphetamine-dextroamphetamine (ADDERALL XR) 30 MG 24 hr capsule Take 1 capsule (30 mg total) by mouth every morning. 30 capsule 0  . amphetamine-dextroamphetamine (ADDERALL) 20 MG tablet Take 1 tablet (20 mg total) by mouth daily as needed (in the afternoon if needed for focus.). 30 tablet 0  . apixaban (ELIQUIS)  5 MG TABS tablet Take 1 tablet (5 mg total) by mouth 2 (two) times daily. 180 tablet 1  . atorvastatin (LIPITOR) 10 MG tablet Take 1 tablet (10 mg total) by mouth daily. 90 tablet 1  . BD PEN NEEDLE MICRO U/F 32G X 6 MM MISC USE 2 PEN NEEDLES DAILY 200 each 3  . canagliflozin (INVOKANA) 300 MG TABS tablet Take 1 tablet (300 mg total) by mouth daily before  breakfast. 90 tablet 1  . flecainide (TAMBOCOR) 100 MG tablet TAKE 1 TABLET(100 MG) BY MOUTH TWICE DAILY 60 tablet 11  . Insulin Glargine, 1 Unit Dial, (TOUJEO SOLOSTAR) 300 UNIT/ML SOPN Inject 20 Units into the skin daily. 4.5 mL 1  . metFORMIN (GLUCOPHAGE) 1000 MG tablet Take 1 tablet (1,000 mg total) by mouth 2 (two) times daily with a meal. 180 tablet 3  . metoprolol tartrate (LOPRESSOR) 25 MG tablet Take 0.5 tablets (12.5 mg total) by mouth 2 (two) times daily. 180 tablet 0  . omeprazole (PRILOSEC) 20 MG capsule Take 1 capsule (20 mg total) by mouth every evening. 90 capsule 1  . tadalafil (CIALIS) 20 MG tablet Take 1 tablet (20 mg total) by mouth daily as needed for erectile dysfunction. 10 tablet 5   No current facility-administered medications for this visit.    Allergies:   Cephalexin   Social History:  The patient  reports that he quit smoking about 14 years ago. His smoking use included cigarettes. He quit after 34.00 years of use. He quit smokeless tobacco use about 12 years ago.  His smokeless tobacco use included chew. He reports current alcohol use. He reports that he does not use drugs.   Family History:  The patient's  family history includes COPD in his mother; Cancer in his paternal grandmother; Diabetes in his maternal aunt and maternal aunt; Heart attack in his father.   ROS:  Please see the history of present illness.   All other systems are personally reviewed and negative.    Exam:    Vital Signs:  There were no vitals taken for this visit.  no acute distress, no shortness of breath.  Labs/Other Tests and Data Reviewed:    Recent Labs: 08/07/2018: Pro B Natriuretic peptide (BNP) 103.0 02/13/2019: ALT 31; BUN 24; Creatinine, Ser 1.00; Hemoglobin 15.1; Platelets 158.0; Potassium 4.3; Sodium 139; TSH 2.50   Wt Readings from Last 3 Encounters:  02/13/19 196 lb 2 oz (89 kg)  01/29/19 195 lb (88.5 kg)  12/21/18 196 lb 12.8 oz (89.3 kg)     Other studies  personally reviewed: Additional studies/ records that were reviewed today include: ECG 01/29/19  Review of the above records today demonstrates: Atrial fibrillation, rate 71   ASSESSMENT & PLAN:    1.  Paroxysmal atrial fibrillation: Zio patch shows 50% atrial fibrillation burden.  Currently on flecainide and Eliquis.  CHA2DS2-VASc of 1.  We Nathanel Tallman plan for ablation.  Risks and benefits were discussed and include bleeding, tamponade, heart block, stroke, damage surrounding organs.  Understands these risks and is agreed to the procedure.  2.  Hyperlipidemia: Atorvastatin per primary care   COVID 19 screen The patient denies symptoms of COVID 19 at this time.  The importance of social distancing was discussed today.  Follow-up: 3 months   Current medicines are reviewed at length with the patient today.   The patient does not have concerns regarding his medicines.  The following changes were made today:  none  Labs/ tests ordered today include:  No orders of the defined types were placed in this encounter.    Patient Risk:  after full review of this patients clinical status, I feel that they are at moderate risk at this time.  Today, I have spent 9 minutes with the patient with telehealth technology discussing atrial fibrillation.    Signed, Lyal Husted Meredith Leeds, MD  03/01/2019 3:17 PM     [address] [place] [address] [phone] (office) [phone] (fax)

## 2019-03-05 ENCOUNTER — Encounter: Payer: Self-pay | Admitting: Internal Medicine

## 2019-03-07 ENCOUNTER — Telehealth: Payer: Self-pay

## 2019-03-07 NOTE — Telephone Encounter (Signed)
Yes, he can be placed on the nurse visit schedule for HepA/B. This is a 3 series vaccine. It is a 0-1-5 month series. Can you schedule for all 3 visits please

## 2019-03-07 NOTE — Telephone Encounter (Signed)
Patient called and states that he was tested for Hep B and it come back that he is needing this injection. Can he go ahead and schedule this appointment? Didn't see any notes about this.

## 2019-03-07 NOTE — Telephone Encounter (Signed)
Patient schedule for all 3 Hep A/B vaccines. 03/11/2019 04/11/2019 09/09/2019

## 2019-03-11 ENCOUNTER — Ambulatory Visit: Payer: 59

## 2019-03-14 ENCOUNTER — Other Ambulatory Visit: Payer: Self-pay | Admitting: Internal Medicine

## 2019-03-14 DIAGNOSIS — F900 Attention-deficit hyperactivity disorder, predominantly inattentive type: Secondary | ICD-10-CM

## 2019-03-15 ENCOUNTER — Other Ambulatory Visit: Payer: 59 | Admitting: *Deleted

## 2019-03-15 ENCOUNTER — Other Ambulatory Visit: Payer: Self-pay

## 2019-03-15 DIAGNOSIS — I480 Paroxysmal atrial fibrillation: Secondary | ICD-10-CM

## 2019-03-15 DIAGNOSIS — Z01812 Encounter for preprocedural laboratory examination: Secondary | ICD-10-CM

## 2019-03-16 LAB — BASIC METABOLIC PANEL
BUN/Creatinine Ratio: 21 (ref 10–24)
BUN: 20 mg/dL (ref 8–27)
CO2: 23 mmol/L (ref 20–29)
Calcium: 9.6 mg/dL (ref 8.6–10.2)
Chloride: 105 mmol/L (ref 96–106)
Creatinine, Ser: 0.97 mg/dL (ref 0.76–1.27)
GFR calc Af Amer: 96 mL/min/{1.73_m2} (ref >59)
GFR calc non Af Amer: 83 mL/min/{1.73_m2} (ref >59)
Glucose: 107 mg/dL — ABNORMAL HIGH (ref 65–99)
Potassium: 4.5 mmol/L (ref 3.5–5.2)
Sodium: 143 mmol/L (ref 134–144)

## 2019-03-16 LAB — CBC
Hematocrit: 47.5 % (ref 37.5–51.0)
Hemoglobin: 16.4 g/dL (ref 13.0–17.7)
MCH: 30.1 pg (ref 26.6–33.0)
MCHC: 34.5 g/dL (ref 31.5–35.7)
MCV: 87 fL (ref 79–97)
Platelets: 167 10*3/uL (ref 150–450)
RBC: 5.44 x10E6/uL (ref 4.14–5.80)
RDW: 12.3 % (ref 11.6–15.4)
WBC: 7.2 10*3/uL (ref 3.4–10.8)

## 2019-03-17 MED ORDER — AMPHETAMINE-DEXTROAMPHET ER 30 MG PO CP24: 30.0000 mg | ORAL_CAPSULE | ORAL | 0 refills | Status: DC

## 2019-03-17 MED ORDER — AMPHETAMINE-DEXTROAMPHETAMINE 20 MG PO TABS: 20.0000 mg | ORAL_TABLET | Freq: Every day | ORAL | 0 refills | Status: DC | PRN

## 2019-03-18 ENCOUNTER — Encounter: Payer: Self-pay | Admitting: Internal Medicine

## 2019-03-18 ENCOUNTER — Other Ambulatory Visit: Payer: Self-pay | Admitting: Internal Medicine

## 2019-03-18 DIAGNOSIS — E118 Type 2 diabetes mellitus with unspecified complications: Secondary | ICD-10-CM

## 2019-03-18 MED ORDER — FREESTYLE LIBRE 14 DAY READER DEVI: 1.0000 | Freq: Every day | 4 refills | Status: DC

## 2019-03-18 MED ORDER — FREESTYLE LIBRE 14 DAY SENSOR MISC: 1.0000 | Freq: Every day | 5 refills | Status: DC

## 2019-03-21 ENCOUNTER — Telehealth (HOSPITAL_COMMUNITY): Payer: Self-pay | Admitting: Emergency Medicine

## 2019-03-21 ENCOUNTER — Encounter (HOSPITAL_COMMUNITY): Payer: Self-pay

## 2019-03-21 NOTE — Telephone Encounter (Signed)
Reaching out to patient to offer assistance regarding upcoming cardiac imaging study; pt verbalizes understanding of appt date/time, parking situation and where to check in, pre-test NPO status and medications ordered, and verified current allergies; name and call back number provided for further questions should they arise Marchia Bond RN Navigator Cardiac [facility] [phone] office [phone] cell  Pt confirmed that he has withheld his ED medication for 3 days now.

## 2019-03-22 ENCOUNTER — Other Ambulatory Visit: Payer: Self-pay

## 2019-03-22 ENCOUNTER — Ambulatory Visit (HOSPITAL_COMMUNITY)
Admission: RE | Admit: 2019-03-22 | Discharge: 2019-03-22 | Disposition: A | Discharge status: Home / Self Care | Payer: 59 | Source: Ambulatory Visit | Attending: Cardiology | Admitting: Cardiology

## 2019-03-22 ENCOUNTER — Telehealth: Payer: Self-pay | Admitting: Cardiology

## 2019-03-22 DIAGNOSIS — I480 Paroxysmal atrial fibrillation: Secondary | ICD-10-CM | POA: Insufficient documentation

## 2019-03-22 MED ORDER — IOHEXOL 350 MG/ML SOLN
80.0000 mL | Freq: Once | INTRAVENOUS | Status: AC | PRN
  Administered 2019-03-22: 80 mL via INTRAVENOUS

## 2019-03-22 NOTE — Telephone Encounter (Signed)
Returned call to radiology. Unexpected finding on CT. Calling to report cirrhosis noted on test - advise to consider MRI to exclude possible hepatocellular carcinoma. Result to still be reviewed by Dr. Curt Bears and will await his review/advisement.

## 2019-03-22 NOTE — Telephone Encounter (Signed)
Erline Levine from [facility] was calling with CT results for this patient. It is not urgent, but please contact [facility] [phone]. Any associate that answers will be able to give results

## 2019-03-23 ENCOUNTER — Other Ambulatory Visit (HOSPITAL_COMMUNITY)
Admission: RE | Admit: 2019-03-23 | Discharge: 2019-03-23 | Disposition: A | Discharge status: Home / Self Care | Payer: 59 | Source: Ambulatory Visit | Attending: Cardiology | Admitting: Cardiology

## 2019-03-23 DIAGNOSIS — Z01812 Encounter for preprocedural laboratory examination: Secondary | ICD-10-CM | POA: Diagnosis present

## 2019-03-23 DIAGNOSIS — Z20822 Contact with and (suspected) exposure to covid-19: Secondary | ICD-10-CM | POA: Diagnosis not present

## 2019-03-23 LAB — SARS CORONAVIRUS 2 (TAT 6-24 HRS): SARS Coronavirus 2: NEGATIVE

## 2019-03-25 ENCOUNTER — Ambulatory Visit (HOSPITAL_COMMUNITY)
Admission: RE | Admit: 2019-03-25 | Discharge: 2019-03-25 | Disposition: A | Discharge status: Home / Self Care | Payer: 59 | Source: Ambulatory Visit | Attending: Cardiology | Admitting: Cardiology

## 2019-03-25 DIAGNOSIS — I480 Paroxysmal atrial fibrillation: Secondary | ICD-10-CM | POA: Insufficient documentation

## 2019-03-25 NOTE — Telephone Encounter (Signed)
Late entry: Spoke to pt last Friday, 2/5, evening to inform him of abn findings (per [facility] request). Pt reports this is not surprising for him, hx Hepatitis. Pt aware I will discuss further with Camnitz and look into hx to determine if this is a concern or chronic issue w/o acute need. Pt agreeable to plan. Dr. Curt Bears made aware.

## 2019-03-26 ENCOUNTER — Telehealth: Payer: Self-pay | Admitting: Cardiology

## 2019-03-26 NOTE — Telephone Encounter (Signed)
Called pt to discuss findings further, per Camnitz. Informed that Dr. Curt Bears had spoken with me earlier this morning about findings and was wanting me to call him and go over findings in more detail. We had spoken on Friday about Cirrhosis finding. Today we discussed the Ca score result and he is aware that Dr. Curt Bears will further explain/discuss these findings tomorrow after ablation. Pt also aware that CT could not r/o LAA thrombus.  Aware that TEE will be performed tomorrow morning, on the table, prior to the ablation.  Patient verbalized understanding and agreeable to plan.

## 2019-03-26 NOTE — Telephone Encounter (Signed)
New Message  Patient is calling in wanting to speak with Dr. Curt Bears nurse about the Ablation that is scheduled for tomorrow. States that he has questions. Please give patient a call back to discuss.

## 2019-03-26 NOTE — Progress Notes (Signed)
Instructed patient on the following items: Arrival time 0930 Nothing to eat or drink after midnight No meds AM of procedure Responsible person to drive you home and stay with you for 24 hrs  Patient on Eliquis, hasn't missed any doses.

## 2019-03-27 ENCOUNTER — Ambulatory Visit (HOSPITAL_COMMUNITY): Payer: 59 | Admitting: Anesthesiology

## 2019-03-27 ENCOUNTER — Encounter (HOSPITAL_COMMUNITY)
Admission: RE | Disposition: A | Discharge status: Home / Self Care | Payer: 59 | Source: Home / Self Care | Attending: Cardiology

## 2019-03-27 ENCOUNTER — Other Ambulatory Visit: Payer: Self-pay

## 2019-03-27 ENCOUNTER — Encounter: Payer: Self-pay | Admitting: Internal Medicine

## 2019-03-27 ENCOUNTER — Encounter (HOSPITAL_COMMUNITY): Payer: Self-pay | Admitting: Cardiology

## 2019-03-27 ENCOUNTER — Ambulatory Visit (HOSPITAL_BASED_OUTPATIENT_CLINIC_OR_DEPARTMENT_OTHER): Payer: 59

## 2019-03-27 ENCOUNTER — Ambulatory Visit (HOSPITAL_COMMUNITY)
Admission: RE | Admit: 2019-03-27 | Discharge: 2019-03-27 | Disposition: A | Discharge status: Home / Self Care | Payer: 59 | Attending: Cardiology | Admitting: Cardiology

## 2019-03-27 DIAGNOSIS — E119 Type 2 diabetes mellitus without complications: Secondary | ICD-10-CM | POA: Insufficient documentation

## 2019-03-27 DIAGNOSIS — Z7901 Long term (current) use of anticoagulants: Secondary | ICD-10-CM | POA: Diagnosis not present

## 2019-03-27 DIAGNOSIS — K219 Gastro-esophageal reflux disease without esophagitis: Secondary | ICD-10-CM | POA: Insufficient documentation

## 2019-03-27 DIAGNOSIS — Z833 Family history of diabetes mellitus: Secondary | ICD-10-CM | POA: Insufficient documentation

## 2019-03-27 DIAGNOSIS — Z881 Allergy status to other antibiotic agents status: Secondary | ICD-10-CM | POA: Diagnosis not present

## 2019-03-27 DIAGNOSIS — Z79899 Other long term (current) drug therapy: Secondary | ICD-10-CM | POA: Diagnosis not present

## 2019-03-27 DIAGNOSIS — I420 Dilated cardiomyopathy: Secondary | ICD-10-CM | POA: Insufficient documentation

## 2019-03-27 DIAGNOSIS — Z794 Long term (current) use of insulin: Secondary | ICD-10-CM | POA: Diagnosis not present

## 2019-03-27 DIAGNOSIS — Z87891 Personal history of nicotine dependence: Secondary | ICD-10-CM | POA: Insufficient documentation

## 2019-03-27 DIAGNOSIS — I513 Intracardiac thrombosis, not elsewhere classified: Secondary | ICD-10-CM | POA: Insufficient documentation

## 2019-03-27 DIAGNOSIS — F909 Attention-deficit hyperactivity disorder, unspecified type: Secondary | ICD-10-CM | POA: Diagnosis not present

## 2019-03-27 DIAGNOSIS — I4891 Unspecified atrial fibrillation: Secondary | ICD-10-CM

## 2019-03-27 DIAGNOSIS — Z8249 Family history of ischemic heart disease and other diseases of the circulatory system: Secondary | ICD-10-CM | POA: Insufficient documentation

## 2019-03-27 DIAGNOSIS — E785 Hyperlipidemia, unspecified: Secondary | ICD-10-CM | POA: Diagnosis not present

## 2019-03-27 DIAGNOSIS — I480 Paroxysmal atrial fibrillation: Secondary | ICD-10-CM | POA: Diagnosis not present

## 2019-03-27 DIAGNOSIS — N400 Enlarged prostate without lower urinary tract symptoms: Secondary | ICD-10-CM | POA: Insufficient documentation

## 2019-03-27 DIAGNOSIS — B182 Chronic viral hepatitis C: Secondary | ICD-10-CM | POA: Insufficient documentation

## 2019-03-27 HISTORY — PX: ATRIAL FIBRILLATION ABLATION: EP1191

## 2019-03-27 LAB — GLUCOSE, CAPILLARY
Glucose-Capillary: 158 mg/dL — ABNORMAL HIGH (ref 70–99)
Glucose-Capillary: 105 mg/dL — ABNORMAL HIGH (ref 70–99)

## 2019-03-27 LAB — ECHO TEE
Height: 68 in
Weight: 3120 oz

## 2019-03-27 SURGERY — ATRIAL FIBRILLATION ABLATION
Anesthesia: General

## 2019-03-27 MED ORDER — ONDANSETRON HCL 4 MG/2ML IJ SOLN: 4.0000 mg | Freq: Four times a day (QID) | INTRAMUSCULAR | Status: DC | PRN

## 2019-03-27 MED ORDER — SODIUM CHLORIDE 0.9% FLUSH: 3.0000 mL | Freq: Two times a day (BID) | INTRAVENOUS | Status: DC

## 2019-03-27 MED ORDER — RIVAROXABAN 20 MG PO TABS: 20.0000 mg | ORAL_TABLET | Freq: Every day | ORAL | 6 refills | Status: DC

## 2019-03-27 MED ORDER — LIDOCAINE 2% (20 MG/ML) 5 ML SYRINGE
INTRAMUSCULAR | Status: DC | PRN
  Administered 2019-03-27: 100 mg via INTRAVENOUS

## 2019-03-27 MED ORDER — FENTANYL CITRATE (PF) 100 MCG/2ML IJ SOLN
INTRAMUSCULAR | Status: DC | PRN
  Administered 2019-03-27: 50 ug via INTRAVENOUS

## 2019-03-27 MED ORDER — SODIUM CHLORIDE 0.9 % IV SOLN: 250.0000 mL | INTRAVENOUS | Status: DC | PRN

## 2019-03-27 MED ORDER — ONDANSETRON HCL 4 MG/2ML IJ SOLN
INTRAMUSCULAR | Status: DC | PRN
  Administered 2019-03-27: 4 mg via INTRAVENOUS

## 2019-03-27 MED ORDER — PHENYLEPHRINE HCL-NACL 10-0.9 MG/250ML-% IV SOLN
INTRAVENOUS | Status: DC | PRN
  Administered 2019-03-27: 25 ug/min via INTRAVENOUS

## 2019-03-27 MED ORDER — PROPOFOL 10 MG/ML IV BOLUS
INTRAVENOUS | Status: DC | PRN
  Administered 2019-03-27: 30 mg via INTRAVENOUS
  Administered 2019-03-27: 100 mg via INTRAVENOUS

## 2019-03-27 MED ORDER — ACETAMINOPHEN 325 MG PO TABS
650.0000 mg | ORAL_TABLET | ORAL | Status: DC | PRN
  Filled 2019-03-27: qty 2

## 2019-03-27 MED ORDER — SODIUM CHLORIDE 0.9 % IV SOLN: INTRAVENOUS | Status: DC

## 2019-03-27 MED ORDER — SODIUM CHLORIDE 0.9% FLUSH: 3.0000 mL | INTRAVENOUS | Status: DC | PRN

## 2019-03-27 MED ORDER — MIDAZOLAM HCL 5 MG/5ML IJ SOLN
INTRAMUSCULAR | Status: DC | PRN
  Administered 2019-03-27: 2 mg via INTRAVENOUS

## 2019-03-27 MED ORDER — SUCCINYLCHOLINE CHLORIDE 20 MG/ML IJ SOLN
INTRAMUSCULAR | Status: DC | PRN
  Administered 2019-03-27: 120 mg via INTRAVENOUS

## 2019-03-27 SURGICAL SUPPLY — 3 items
BLANKET WARM UNDERBOD FULL ACC (MISCELLANEOUS) ×2 IMPLANT
PAD PRO RADIOLUCENT 2001M-C (PAD) ×2 IMPLANT
PATCH CARTO3 (PAD) ×1 IMPLANT

## 2019-03-27 NOTE — H&P (Signed)
Michael Li has presented today for surgery, with the diagnosis of atrial fibrillation.  The various methods of treatment have been discussed with the patient and family. After consideration of risks, benefits and other options for treatment, the patient has consented to  Procedure(s): Catheter ablation as a surgical intervention .  Risks include but not limited to bleeding, tamponade, heart block, stroke, damage to surrounding organs, among others. The patient's history has been reviewed, patient examined, no change in status, stable for surgery.  I have reviewed the patient's chart and labs.  Questions were answered to the patient's satisfaction.    Briana Farner Curt Bears, MD 03/27/2019 12:19 PM

## 2019-03-27 NOTE — Anesthesia Procedure Notes (Signed)
Procedure Name: Intubation Date/Time: 03/27/2019 1:23 PM Performed by: Kyung Rudd, CRNA Pre-anesthesia Checklist: Patient identified, Emergency Drugs available, Suction available, Patient being monitored and Timeout performed Patient Re-evaluated:Patient Re-evaluated prior to induction Oxygen Delivery Method: Circle system utilized Preoxygenation: Pre-oxygenation with 100% oxygen Induction Type: IV induction and Rapid sequence Laryngoscope Size: Mac and 3 Grade View: Grade I Tube type: Oral Tube size: 7.5 mm Number of attempts: 1 Airway Equipment and Method: Stylet Placement Confirmation: ETT inserted through vocal cords under direct vision,  positive ETCO2 and breath sounds checked- equal and bilateral Secured at: 22 cm Tube secured with: Tape Dental Injury: Teeth and Oropharynx as per pre-operative assessment

## 2019-03-27 NOTE — Progress Notes (Signed)
Discharge instructions reviewed with patient and family. Verbalized understanding. Report given to [facility]. Pt expected to discharge at 1615.

## 2019-03-27 NOTE — Progress Notes (Signed)
  Echocardiogram Echocardiogram Transesophageal with definity has been performed.  Michael Li M 03/27/2019, 2:18 PM

## 2019-03-27 NOTE — Discharge Instructions (Signed)
General Anesthesia, Adult, Care After This sheet gives you information about how to care for yourself after your procedure. Your health care provider may also give you more specific instructions. If you have problems or questions, contact your health care provider. What can I expect after the procedure? After the procedure, the following side effects are common:  Pain or discomfort at the IV site.  Nausea.  Vomiting.  Sore throat.  Trouble concentrating.  Feeling cold or chills.  Weak or tired.  Sleepiness and fatigue.  Soreness and body aches. These side effects can affect parts of the body that were not involved in surgery. Follow these instructions at home:  For at least 24 hours after the procedure:  Have a responsible adult stay with you. It is important to have someone help care for you until you are awake and alert.  Rest as needed.  Do not: ? Participate in activities in which you could fall or become injured. ? Drive. ? Use heavy machinery. ? Drink alcohol. ? Take sleeping pills or medicines that cause drowsiness. ? Make important decisions or sign legal documents. ? Take care of children on your own. Eating and drinking  Follow any instructions from your health care provider about eating or drinking restrictions.  When you feel hungry, start by eating small amounts of foods that are soft and easy to digest (bland), such as toast. Gradually return to your regular diet.  Drink enough fluid to keep your urine pale yellow.  If you vomit, rehydrate by drinking water, juice, or clear broth. General instructions  If you have sleep apnea, surgery and certain medicines can increase your risk for breathing problems. Follow instructions from your health care provider about wearing your sleep device: ? Anytime you are sleeping, including during daytime naps. ? While taking prescription pain medicines, sleeping medicines, or medicines that make you drowsy.  Return to  your normal activities as told by your health care provider. Ask your health care provider what activities are safe for you.  Take over-the-counter and prescription medicines only as told by your health care provider.  If you smoke, do not smoke without supervision.  Keep all follow-up visits as told by your health care provider. This is important. Contact a health care provider if:  You have nausea or vomiting that does not get better with medicine.  You cannot eat or drink without vomiting.  You have pain that does not get better with medicine.  You are unable to pass urine.  You develop a skin rash.  You have a fever.  You have redness around your IV site that gets worse. Get help right away if:  You have difficulty breathing.  You have chest pain.  You have blood in your urine or stool, or you vomit blood. Summary  After the procedure, it is common to have a sore throat or nausea. It is also common to feel tired.  Have a responsible adult stay with you for the first 24 hours after general anesthesia. It is important to have someone help care for you until you are awake and alert.  When you feel hungry, start by eating small amounts of foods that are soft and easy to digest (bland), such as toast. Gradually return to your regular diet.  Drink enough fluid to keep your urine pale yellow.  Return to your normal activities as told by your health care provider. Ask your health care provider what activities are safe for you. This information is not   intended to replace advice given to you by your health care provider. Make sure you discuss any questions you have with your health care provider. Document Revised: 02/03/2017 Document Reviewed: 09/16/2016 Elsevier Patient Education  2020 Elsevier Inc.  

## 2019-03-27 NOTE — Anesthesia Preprocedure Evaluation (Signed)
Anesthesia Evaluation  Patient identified by MRN, date of birth, ID band Patient awake    Reviewed: Allergy & Precautions, NPO status , Patient's Chart, lab work & pertinent test results  History of Anesthesia Complications Negative for: history of anesthetic complications  Airway Mallampati: III  TM Distance: >3 FB Neck ROM: Full    Dental  (+) Dental Advisory Given   Pulmonary shortness of breath, former smoker,    breath sounds clear to auscultation       Cardiovascular + dysrhythmias  Rhythm:Regular   The left ventricular ejection fraction is normal (55-65%).  Nuclear stress EF: 62%.  No T wave inversion was noted during stress.  There was no ST segment deviation noted during stress.  This is a low risk study.   Neuro/Psych PSYCHIATRIC DISORDERS negative neurological ROS     GI/Hepatic GERD  ,(+) Hepatitis -, C  Endo/Other  diabetes  Renal/GU      Musculoskeletal   Abdominal   Peds  Hematology   Anesthesia Other Findings   Reproductive/Obstetrics                             Anesthesia Physical Anesthesia Plan  ASA: III  Anesthesia Plan: General   Post-op Pain Management:    Induction: Intravenous  PONV Risk Score and Plan: 2 and Ondansetron and Dexamethasone  Airway Management Planned: Oral ETT  Additional Equipment: None  Intra-op Plan:   Post-operative Plan: Extubation in OR  Informed Consent: I have reviewed the patients History and Physical, chart, labs and discussed the procedure including the risks, benefits and alternatives for the proposed anesthesia with the patient or authorized representative who has indicated his/her understanding and acceptance.     Dental advisory given  Plan Discussed with: CRNA and Surgeon  Anesthesia Plan Comments:         Anesthesia Quick Evaluation

## 2019-03-27 NOTE — Transfer of Care (Signed)
Immediate Anesthesia Transfer of Care Note  Patient: AGAPITO WEINHARDT  Procedure(s) Performed: ATRIAL FIBRILLATION ABLATION (N/A ) Transesophageal Echocardiogram Darden Dates)  Patient Location: Cath Lab  Anesthesia Type:General  Level of Consciousness: awake, alert  and oriented  Airway & Oxygen Therapy: Patient Spontanous Breathing and Patient connected to nasal cannula oxygen  Post-op Assessment: Report given to RN, Post -op Vital signs reviewed and stable and Patient moving all extremities  Post vital signs: Reviewed and stable  Last Vitals:  Vitals Value Taken Time  BP    Temp    Pulse 82 03/27/19 1414  Resp 14 03/27/19 1414  SpO2 99 % 03/27/19 1414  Vitals shown include unvalidated device data.  Last Pain:  Vitals:   03/27/19 0952  TempSrc: Oral  PainSc:          Complications: No apparent anesthesia complications

## 2019-03-28 ENCOUNTER — Telehealth: Payer: Self-pay | Admitting: Cardiology

## 2019-03-28 NOTE — Anesthesia Postprocedure Evaluation (Signed)
Anesthesia Post Note  Patient: SILVESTER WITHROW  Procedure(s) Performed: ATRIAL FIBRILLATION ABLATION (N/A )     Patient location during evaluation: Cath Lab Anesthesia Type: General Level of consciousness: awake and alert Pain management: pain level controlled Vital Signs Assessment: post-procedure vital signs reviewed and stable Respiratory status: spontaneous breathing, nonlabored ventilation, respiratory function stable and patient connected to nasal cannula oxygen Cardiovascular status: blood pressure returned to baseline and stable Postop Assessment: no apparent nausea or vomiting Anesthetic complications: no    Last Vitals:  Vitals:   03/27/19 1519 03/27/19 1600  BP: (!) 124/92 (!) 145/60  Pulse: 100 87  Resp: 17 17  Temp:    SpO2: 99% 96%    Last Pain:  Vitals:   03/27/19 1519  TempSrc:   PainSc: 0-No pain                 Danell Verno

## 2019-03-28 NOTE — Telephone Encounter (Signed)
New message   Patient needs Pre authorization for rivaroxaban (XARELTO) 20 MG TABS tablet. Please call [phone].

## 2019-03-28 NOTE — Telephone Encounter (Signed)
**Note De-Identified Starnisha Batrez Obfuscation** I called [facility] pharmacy and was advised that they s/w the pt this morning and advised him that they need his new ins card/info and that his Xarelto may need a PA but will not know until they get this needed ins information.  I called the pt and advised that I cannot do this PA without his new ins info as it is required to do a PA. He states that he is going to call [facility] to ask what needs to be done as he states that he did not get a new card and that his ins has not changed. He states that he will contact me as well as [employer] representative with an update after s/w [facility].

## 2019-03-28 NOTE — Telephone Encounter (Signed)
**Note De-Identified Anis Degidio Obfuscation** The pt did call me back and provided the following ins information: Holland Falling ID: [id number] BIN: [id number]  PCN: [id number] GRP: [id number]  I started a Xarelto PA through covermymeds. Key: [id number]

## 2019-03-28 NOTE — Telephone Encounter (Signed)
The following message was received through covermymeds: Asa Lente Key: [id number] - PA Case ID: [id number] Outcome  Approved today  Case [id number] Status:Approved Review Type:Prior Auth Coverage Start Date:02/26/2019;Coverage End Date:03/27/2020 DrugXarelto 20MG  tablets  [employer] [employer] Electronic PA Form [phone] NCPDP)  I have notified the pt and [facility] of this approval.

## 2019-04-04 MED ORDER — PERFLUTREN LIPID MICROSPHERE
1.0000 mL | INTRAVENOUS | Status: AC | PRN
  Administered 2019-03-27: 2 mL via INTRAVENOUS

## 2019-04-10 IMAGING — NM NM MISC PROCEDURE
3 series · 18 of 18 positions shown · non-contrast
Comparison: none

[Series 1: wbr_s-proj_st stress_(id)_sa · 6.5mm · 6.51mm/px · 6 of 512 frames shown (1 of 2)]
[frame 43/512]
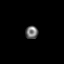
[frame 128/512]
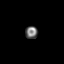
[frame 214/512]
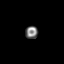
[frame 299/512]
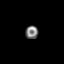
[frame 384/512]
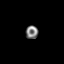
[frame 470/512]
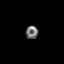

[Series 1: wbr_s-proj_st stress_(id)_sa · 6.5mm · 6.51mm/px · 6 of 64 frames shown (2 of 2)]
[frame 6/64]
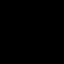
[frame 16/64]
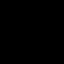
[frame 27/64]
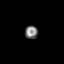
[frame 38/64]
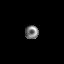
[frame 48/64]
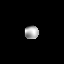
[frame 59/64]
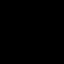

[Series 1: wbr_r-proj_st rest_(id)_sa · 6.5mm · 6.51mm/px · 6 of 64 frames shown]
[frame 6/64]
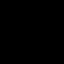
[frame 16/64]
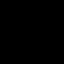
[frame 27/64]
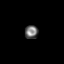
[frame 38/64]
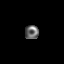
[frame 48/64]
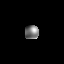
[frame 59/64]
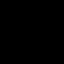

[18 of 18 positions shown; findings below may reference images not displayed]

Canned report from images found in remote index.

Refer to host system for actual result text.

## 2019-04-11 ENCOUNTER — Other Ambulatory Visit: Payer: Self-pay

## 2019-04-11 ENCOUNTER — Ambulatory Visit (INDEPENDENT_AMBULATORY_CARE_PROVIDER_SITE_OTHER): Payer: 59 | Admitting: *Deleted

## 2019-04-11 DIAGNOSIS — Z23 Encounter for immunization: Secondary | ICD-10-CM

## 2019-04-17 ENCOUNTER — Other Ambulatory Visit: Payer: Self-pay | Admitting: Internal Medicine

## 2019-04-17 DIAGNOSIS — F900 Attention-deficit hyperactivity disorder, predominantly inattentive type: Secondary | ICD-10-CM

## 2019-04-18 MED ORDER — AMPHETAMINE-DEXTROAMPHETAMINE 20 MG PO TABS: 20.0000 mg | ORAL_TABLET | Freq: Every day | ORAL | 0 refills | Status: DC | PRN

## 2019-04-18 MED ORDER — AMPHETAMINE-DEXTROAMPHET ER 30 MG PO CP24: 30.0000 mg | ORAL_CAPSULE | ORAL | 0 refills | Status: DC

## 2019-04-24 ENCOUNTER — Ambulatory Visit (HOSPITAL_COMMUNITY)
Admission: RE | Admit: 2019-04-24 | Discharge: 2019-04-24 | Disposition: A | Discharge status: Home / Self Care | Payer: 59 | Source: Ambulatory Visit | Attending: Nurse Practitioner | Admitting: Nurse Practitioner

## 2019-04-24 ENCOUNTER — Other Ambulatory Visit: Payer: Self-pay

## 2019-04-24 ENCOUNTER — Encounter (HOSPITAL_COMMUNITY): Payer: Self-pay | Admitting: Nurse Practitioner

## 2019-04-24 VITALS — BP 116/66 | HR 68 | Ht 68.0 in | Wt 193.6 lb

## 2019-04-24 DIAGNOSIS — Z833 Family history of diabetes mellitus: Secondary | ICD-10-CM | POA: Insufficient documentation

## 2019-04-24 DIAGNOSIS — D6869 Other thrombophilia: Secondary | ICD-10-CM | POA: Diagnosis not present

## 2019-04-24 DIAGNOSIS — I4892 Unspecified atrial flutter: Secondary | ICD-10-CM | POA: Insufficient documentation

## 2019-04-24 DIAGNOSIS — I480 Paroxysmal atrial fibrillation: Secondary | ICD-10-CM | POA: Diagnosis not present

## 2019-04-24 DIAGNOSIS — Z79899 Other long term (current) drug therapy: Secondary | ICD-10-CM | POA: Diagnosis not present

## 2019-04-24 DIAGNOSIS — Z7901 Long term (current) use of anticoagulants: Secondary | ICD-10-CM | POA: Diagnosis not present

## 2019-04-24 DIAGNOSIS — N529 Male erectile dysfunction, unspecified: Secondary | ICD-10-CM | POA: Diagnosis not present

## 2019-04-24 DIAGNOSIS — Z8619 Personal history of other infectious and parasitic diseases: Secondary | ICD-10-CM | POA: Diagnosis not present

## 2019-04-24 DIAGNOSIS — Z881 Allergy status to other antibiotic agents status: Secondary | ICD-10-CM | POA: Diagnosis not present

## 2019-04-24 DIAGNOSIS — Z825 Family history of asthma and other chronic lower respiratory diseases: Secondary | ICD-10-CM | POA: Diagnosis not present

## 2019-04-24 DIAGNOSIS — E119 Type 2 diabetes mellitus without complications: Secondary | ICD-10-CM | POA: Diagnosis not present

## 2019-04-24 DIAGNOSIS — Z85828 Personal history of other malignant neoplasm of skin: Secondary | ICD-10-CM | POA: Insufficient documentation

## 2019-04-24 DIAGNOSIS — Z794 Long term (current) use of insulin: Secondary | ICD-10-CM | POA: Diagnosis not present

## 2019-04-24 DIAGNOSIS — Z8249 Family history of ischemic heart disease and other diseases of the circulatory system: Secondary | ICD-10-CM | POA: Diagnosis not present

## 2019-04-24 DIAGNOSIS — F909 Attention-deficit hyperactivity disorder, unspecified type: Secondary | ICD-10-CM | POA: Insufficient documentation

## 2019-04-24 DIAGNOSIS — K219 Gastro-esophageal reflux disease without esophagitis: Secondary | ICD-10-CM | POA: Diagnosis not present

## 2019-04-24 DIAGNOSIS — Z87891 Personal history of nicotine dependence: Secondary | ICD-10-CM | POA: Insufficient documentation

## 2019-04-24 NOTE — Progress Notes (Signed)
Primary Care Physician: Janith Lima, MD Referring Physician: Dr. Mayra Neer Michael Li is a 64 y.o. male with a h/o paroxysmal afib since 2018. He initially saw Dr. Bettina Gavia in July and was started on Flecainide 50 mg bid. He saw Dr. Curt Bears in early September. Flecainde was increased to 100 mg bid and he returned for a repeat EKG. This showed afib, rate controlled and Dr. Curt Bears referred here for cardioversion.  Pt denies and significant alcohol, caffeine use, no tobacco use. He does not have symptoms of sleep apnea.He likes to daily walk his dog but has not been able to for some months for exertional dyspnea.  F/u in afib clinic, 11/6. His flecainide was increased to 100 mg bid and he went for DCCV. Fortunately, he was in rhythm on presentation for DCCV and it was cancelled. He is continues in SR. He states that he feels no different in SR that he did in afib.   F/u in afib clinic, 3/10.He saw Dr. Curt Bears on f/u and was found to be in afib and he was scheduled for ablation. 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  He was switched from eliquis to [place]. He missed one dose 2 weeks ago, He is in SR today and would like to be considered for rescheduling of ablation.  Today, he denies symptoms of palpitations, chest pain, shortness of breath, orthopnea, PND, lower extremity edema, dizziness, presyncope, syncope, or neurologic sequela. The patient is tolerating medications without difficulties and is otherwise without complaint today.   Past Medical History:  Diagnosis Date  . Anemia    rec'd 32 units of blood, post T&A, 1981  . ATTENTION DEFICIT HYPERACTIVITY DISORDER 07/19/2006  . BENIGN PROSTATIC HYPERTROPHY 07/19/2006  . DIABETES MELLITUS, TYPE II 07/19/2006  . Dyspnea    had shortness of breath when heart was irregular  . Dysrhythmia    history of a-fib  . ERECTILE DYSFUNCTION 07/19/2006  . GERD 07/19/2006  . HEPATITIS C, CHRONIC  04/03/2007   Treated with [place]   . History of blood transfusion   . NEOPLASM, MALIGNANT, CARCINOMA, BASAL CELL, NOSE 10/20/2009   Basal cell   Past Surgical History:  Procedure Laterality Date  . APPENDECTOMY    . ATRIAL FIBRILLATION ABLATION N/A 03/27/2019   Procedure: ATRIAL FIBRILLATION ABLATION;  Surgeon: Constance Haw, MD;  Location: [facility];  Service: Cardiovascular;  Laterality: N/A;  . carotid injury       post tonsillectomy, R carotid injury with trach  . HERNIA REPAIR  1973   inguinal- R side  . KNEE SURGERY Right   . LUMBAR LAMINECTOMY/DECOMPRESSION MICRODISCECTOMY Right 09/12/2012   Procedure: LUMBAR LAMINECTOMY/DECOMPRESSION MICRODISCECTOMY 1 LEVEL;  Surgeon: Charlie Pitter, MD;  Location: [facility];  Service: Neurosurgery;  Laterality: Right;  Right lumbar three-four microdiscectomy  . ORIF SHOULDER FRACTURE Right 06/02/2017   Procedure: OPEN REDUCTION INTERNAL FIXATION (ORIF) RIGHT GLENOID FRACTURE, POSSIBLE LATARJET CORACOID TRANSFER;  Surgeon: Meredith Pel, MD;  Location: [facility];  Service: Orthopedics;  Laterality: Right;  . TONSILLECTOMY    . TRACHEOSTOMY CLOSURE  1981    Current Outpatient Medications  Medication Sig Dispense Refill  . alfuzosin (UROXATRAL) 10 MG 24 hr tablet Take 1 tablet (10 mg total) by mouth daily with breakfast. 90 tablet 1  . amphetamine-dextroamphetamine (ADDERALL XR) 30 MG 24 hr capsule Take 1 capsule (30 mg total) by mouth every morning. 30 capsule 0  .  amphetamine-dextroamphetamine (ADDERALL) 20 MG tablet Take 1 tablet (20 mg total) by mouth daily as needed (in the afternoon if needed for focus.). 30 tablet 0  . atorvastatin (LIPITOR) 10 MG tablet Take 1 tablet (10 mg total) by mouth daily. 90 tablet 1  . BD PEN NEEDLE MICRO U/F 32G X 6 MM MISC USE 2 PEN NEEDLES DAILY 200 each 3  . canagliflozin (INVOKANA) 300 MG TABS tablet Take 1 tablet (300 mg total) by mouth daily before breakfast. 90 tablet 1  . Continuous Blood  Gluc Receiver (FREESTYLE LIBRE 14 DAY READER) DEVI 1 Act by Does not apply route daily. 1 each 4  . Continuous Blood Gluc Sensor (FREESTYLE LIBRE 14 DAY SENSOR) MISC 1 Act by Does not apply route daily. 1 each 5  . Insulin Glargine, 1 Unit Dial, (TOUJEO SOLOSTAR) 300 UNIT/ML SOPN Inject 20 Units into the skin daily. (Patient taking differently: Inject 40 Units into the skin daily. ) 4.5 mL 1  . metFORMIN (GLUCOPHAGE) 1000 MG tablet Take 1 tablet (1,000 mg total) by mouth 2 (two) times daily with a meal. 180 tablet 3  . metoprolol tartrate (LOPRESSOR) 25 MG tablet Take 0.5 tablets (12.5 mg total) by mouth 2 (two) times daily. 180 tablet 0  . omeprazole (PRILOSEC) 20 MG capsule Take 1 capsule (20 mg total) by mouth every evening. 90 capsule 1  . rivaroxaban (XARELTO) 20 MG TABS tablet Take 1 tablet (20 mg total) by mouth daily with supper. 30 tablet 6  . tadalafil (CIALIS) 20 MG tablet Take 1 tablet (20 mg total) by mouth daily as needed for erectile dysfunction. 10 tablet 5   No current facility-administered medications for this encounter.    Allergies  Allergen Reactions  . Cephalexin Anaphylaxis, Swelling and Other (See Comments)    Swelling in throat    Social History   Socioeconomic History  . Marital status: Married    Spouse name: Not on file  . Number of children: 2  . Years of education: Not on file  . Highest education level: Not on file  Occupational History  . Occupation: Best boy: [employer]  Tobacco Use  . Smoking status: Former Smoker    Years: 34.00    Types: Cigarettes    Quit date: 2007    Years since quitting: 14.1  . Smokeless tobacco: Former Systems developer    Types: [place] date: 02/15/2007  Substance and Sexual Activity  . Alcohol use: Yes    Comment: 3 glasses of wine per week   . Drug use: No  . Sexual activity: Yes    Partners: Male  Other Topics Concern  . Not on file  Social History Narrative   Regular exercise-yes    Social Determinants of Health   Financial Resource Strain:   . Difficulty of Paying Living Expenses: Not on file  Food Insecurity:   . Worried About Charity fundraiser in the Last Year: Not on file  . Ran Out of Food in the Last Year: Not on file  Transportation Needs:   . Lack of Transportation (Medical): Not on file  . Lack of Transportation (Non-Medical): Not on file  Physical Activity:   . Days of Exercise per Week: Not on file  . Minutes of Exercise per Session: Not on file  Stress:   . Feeling of Stress : Not on file  Social Connections:   . Frequency of Communication with Friends and Family: Not on  file  . Frequency of Social Gatherings with Friends and Family: Not on file  . Attends Religious Services: Not on file  . Active Member of Clubs or Organizations: Not on file  . Attends Archivist Meetings: Not on file  . Marital Status: Not on file  Intimate Partner Violence:   . Fear of Current or Ex-Partner: Not on file  . Emotionally Abused: Not on file  . Physically Abused: Not on file  . Sexually Abused: Not on file    Family History  Problem Relation Age of Onset  . COPD Mother   . Heart attack Father   . Diabetes Maternal Aunt   . Cancer Paternal Grandmother        colon cancer  . Diabetes Maternal Aunt     ROS- All systems are reviewed and negative except as per the HPI above  Physical Exam: Vitals:   04/24/19 0845  BP: 116/66  Pulse: 68  Weight: 87.8 kg  Height: 5\' 8"  (1.727 m)   Wt Readings from Last 3 Encounters:  04/24/19 87.8 kg  03/27/19 88.5 kg  02/13/19 89 kg    Labs: Lab Results  Component Value Date   NA 143 03/15/2019   K 4.5 03/15/2019   CL 105 03/15/2019   CO2 23 03/15/2019   GLUCOSE 107 (H) 03/15/2019   BUN 20 03/15/2019   CREATININE 0.97 03/15/2019   CALCIUM 9.6 03/15/2019   Lab Results  Component Value Date   INR 1.00 06/02/2017   Lab Results  Component Value Date   CHOL 117 02/13/2019   HDL 37.80 (L)  02/13/2019   LDLCALC 65 02/13/2019   TRIG 68.0 02/13/2019     GEN- The patient is well appearing, alert and oriented x 3 today.   Head- normocephalic, atraumatic Eyes-  Sclera clear, conjunctiva pink Ears- hearing intact Oropharynx- clear Neck- supple, no JVP Lymph- no cervical lymphadenopathy Lungs- Clear to ausculation bilaterally, normal work of breathing Heart- regular rate and rhythm, no murmurs, rubs or gallops, PMI not laterally displaced GI- soft, NT, ND, + BS Extremities- no clubbing, cyanosis, or edema MS- no significant deformity or atrophy Skin- no rash or lesion Psych- euthymic mood, full affect Neuro- strength and sensation are intact  EKG-SR with first degree AV block, pr int 210 ms, qrs int 96 ms, qtc 397 ms    Assessment and Plan: 1. Afib/flutter  Scheduled for ablation but had Left atrial appendage thrombus Procedure cancelled and eliquis was changed to xarelto 20 mg daily   2. CHA2DS2VASc score of 3 ( DM, thrombus) Continue xarelto  20  mg daily and reminded not to miss doses He did miss one dose 2 weeks ago  Will let  Dr. Curt Bears know that he wants to reschedule ablation   Butch Penny C. Hudsyn Barich, [facility] [address] [place], [place] [address] [phone]

## 2019-05-09 ENCOUNTER — Other Ambulatory Visit: Payer: Self-pay

## 2019-05-09 ENCOUNTER — Ambulatory Visit (INDEPENDENT_AMBULATORY_CARE_PROVIDER_SITE_OTHER): Payer: 59 | Admitting: *Deleted

## 2019-05-09 DIAGNOSIS — Z23 Encounter for immunization: Secondary | ICD-10-CM

## 2019-05-22 ENCOUNTER — Other Ambulatory Visit: Payer: Self-pay | Admitting: Internal Medicine

## 2019-05-22 DIAGNOSIS — F900 Attention-deficit hyperactivity disorder, predominantly inattentive type: Secondary | ICD-10-CM

## 2019-05-22 MED ORDER — AMPHETAMINE-DEXTROAMPHET ER 30 MG PO CP24: 30.0000 mg | ORAL_CAPSULE | ORAL | 0 refills | Status: DC

## 2019-05-22 MED ORDER — AMPHETAMINE-DEXTROAMPHETAMINE 20 MG PO TABS: 20.0000 mg | ORAL_TABLET | Freq: Every day | ORAL | 0 refills | Status: DC | PRN

## 2019-05-23 ENCOUNTER — Telehealth: Payer: Self-pay | Admitting: *Deleted

## 2019-05-23 NOTE — Telephone Encounter (Signed)
lmtcb to schedule ablation for May

## 2019-05-28 NOTE — Telephone Encounter (Signed)
Ablation rescheduled for 5/5 Aware I will call back this week to review procedure instructions/TEE/Covid screening. Patient verbalized understanding and agreeable to plan.

## 2019-06-04 ENCOUNTER — Telehealth (INDEPENDENT_AMBULATORY_CARE_PROVIDER_SITE_OTHER): Payer: 59 | Admitting: Cardiology

## 2019-06-04 ENCOUNTER — Encounter: Payer: Self-pay | Admitting: Cardiology

## 2019-06-04 ENCOUNTER — Other Ambulatory Visit: Payer: Self-pay

## 2019-06-04 DIAGNOSIS — E785 Hyperlipidemia, unspecified: Secondary | ICD-10-CM | POA: Diagnosis not present

## 2019-06-04 DIAGNOSIS — I4819 Other persistent atrial fibrillation: Secondary | ICD-10-CM

## 2019-06-04 NOTE — Progress Notes (Signed)
Electrophysiology TeleHealth Note   Due to national recommendations of social distancing due to COVID 19, an audio/video telehealth visit is felt to be most appropriate for this patient at this time.  See Epic message for the patient's consent to telehealth for [facility].   Date:  06/04/2019   ID:  Michael Li, DOB [birth year], MRN [id number]  Location: patient's home  Provider location: [address], [place]  Evaluation Performed: Follow-up visit  PCP:  Janith Lima, MD  Cardiologist:  No primary care provider on file.  Electrophysiologist:  Dr Curt Bears  Chief Complaint:  AF  History of Present Illness:    Michael Li is a 64 y.o. male who presents via audio/video conferencing for a telehealth visit today.  Since last being seen in our clinic, the patient reports doing very well.  Today, he denies symptoms of palpitations, chest pain, shortness of breath,  lower extremity edema, dizziness, presyncope, or syncope.  The patient is otherwise without complaint today.  The patient denies symptoms of fevers, chills, cough, or new SOB worrisome for COVID 19.  Has a history of persistent atrial fibrillation, type 2 diabetes, hepatitis C.  He was initially scheduled for ablation, but TEE on the ablation table showed a left atrial appendage thrombus.  He was switched from Eliquis to Xarelto.  He returned to A. fib clinic 04/24/2019 in sinus rhythm.  Today, denies symptoms of palpitations, chest pain,  orthopnea, PND, lower extremity edema, claudication, dizziness, presyncope, syncope, bleeding, or neurologic sequela. The patient is tolerating medications without difficulties.  He continues to have symptoms of shortness of breath associated with his atrial fibrillation.  His shortness of breath is mainly with exertion.  He feels well at rest.  He is ready for ablation.  Past Medical History:  Diagnosis Date  . Anemia    rec'd 32 units of blood, post T&A, 1981  .  ATTENTION DEFICIT HYPERACTIVITY DISORDER 07/19/2006  . BENIGN PROSTATIC HYPERTROPHY 07/19/2006  . DIABETES MELLITUS, TYPE II 07/19/2006  . Dyspnea    had shortness of breath when heart was irregular  . Dysrhythmia    history of a-fib  . ERECTILE DYSFUNCTION 07/19/2006  . GERD 07/19/2006  . HEPATITIS C, CHRONIC 04/03/2007   Treated with [place]   . History of blood transfusion   . NEOPLASM, MALIGNANT, CARCINOMA, BASAL CELL, NOSE 10/20/2009   Basal cell    Past Surgical History:  Procedure Laterality Date  . APPENDECTOMY    . ATRIAL FIBRILLATION ABLATION N/A 03/27/2019   Procedure: ATRIAL FIBRILLATION ABLATION;  Surgeon: Constance Haw, MD;  Location: [place] [facility];  Service: Cardiovascular;  Laterality: N/A;  . carotid injury       post tonsillectomy, R carotid injury with trach  . HERNIA REPAIR  1973   inguinal- R side  . KNEE SURGERY Right   . LUMBAR LAMINECTOMY/DECOMPRESSION MICRODISCECTOMY Right 09/12/2012   Procedure: LUMBAR LAMINECTOMY/DECOMPRESSION MICRODISCECTOMY 1 LEVEL;  Surgeon: Charlie Pitter, MD;  Location: [facility];  Service: Neurosurgery;  Laterality: Right;  Right lumbar three-four microdiscectomy  . ORIF SHOULDER FRACTURE Right 06/02/2017   Procedure: OPEN REDUCTION INTERNAL FIXATION (ORIF) RIGHT GLENOID FRACTURE, POSSIBLE LATARJET CORACOID TRANSFER;  Surgeon: Meredith Pel, MD;  Location: [facility];  Service: Orthopedics;  Laterality: Right;  . TONSILLECTOMY    . TRACHEOSTOMY CLOSURE  1981    Current Outpatient Medications  Medication Sig Dispense Refill  . alfuzosin (UROXATRAL) 10 MG 24 hr tablet  Take 1 tablet (10 mg total) by mouth daily with breakfast. 90 tablet 1  . amphetamine-dextroamphetamine (ADDERALL XR) 30 MG 24 hr capsule Take 1 capsule (30 mg total) by mouth every morning. 30 capsule 0  . amphetamine-dextroamphetamine (ADDERALL) 20 MG tablet Take 1 tablet (20 mg total) by mouth daily as needed (in the afternoon if needed for focus.). 30 tablet 0  .  atorvastatin (LIPITOR) 10 MG tablet Take 1 tablet (10 mg total) by mouth daily. 90 tablet 1  . BD PEN NEEDLE MICRO U/F 32G X 6 MM MISC USE 2 PEN NEEDLES DAILY 200 each 3  . canagliflozin (INVOKANA) 300 MG TABS tablet Take 1 tablet (300 mg total) by mouth daily before breakfast. 90 tablet 1  . Continuous Blood Gluc Receiver (FREESTYLE LIBRE 14 DAY READER) DEVI 1 Act by Does not apply route daily. 1 each 4  . Continuous Blood Gluc Sensor (FREESTYLE LIBRE 14 DAY SENSOR) MISC 1 Act by Does not apply route daily. 1 each 5  . Insulin Glargine, 1 Unit Dial, (TOUJEO SOLOSTAR) 300 UNIT/ML SOPN Inject 20 Units into the skin daily. (Patient taking differently: Inject 40 Units into the skin daily. ) 4.5 mL 1  . metFORMIN (GLUCOPHAGE) 1000 MG tablet Take 1 tablet (1,000 mg total) by mouth 2 (two) times daily with a meal. 180 tablet 3  . metoprolol tartrate (LOPRESSOR) 25 MG tablet Take 0.5 tablets (12.5 mg total) by mouth 2 (two) times daily. 180 tablet 0  . omeprazole (PRILOSEC) 20 MG capsule Take 1 capsule (20 mg total) by mouth every evening. 90 capsule 1  . rivaroxaban (XARELTO) 20 MG TABS tablet Take 1 tablet (20 mg total) by mouth daily with supper. 30 tablet 6  . tadalafil (CIALIS) 20 MG tablet Take 1 tablet (20 mg total) by mouth daily as needed for erectile dysfunction. 10 tablet 5   No current facility-administered medications for this visit.    Allergies:   Cephalexin   Social History:  The patient  reports that he quit smoking about 14 years ago. His smoking use included cigarettes. He quit after 34.00 years of use. He quit smokeless tobacco use about 12 years ago.  His smokeless tobacco use included chew. He reports current alcohol use. He reports that he does not use drugs.   Family History:  The patient's  family history includes COPD in his mother; Cancer in his paternal grandmother; Diabetes in his maternal aunt and maternal aunt; Heart attack in his father.   ROS:  Please see the history of  present illness.   All other systems are personally reviewed and negative.    Exam:    Vital Signs:  There were no vitals taken for this visit.  no acute distress, no shortness of breath.  Labs/Other Tests and Data Reviewed:    Recent Labs: 08/07/2018: Pro B Natriuretic peptide (BNP) 103.0 02/13/2019: ALT 31; TSH 2.50 03/15/2019: BUN 20; Creatinine, Ser 0.97; Hemoglobin 16.4; Platelets 167; Potassium 4.5; Sodium 143   Wt Readings from Last 3 Encounters:  04/24/19 193 lb 9.6 oz (87.8 kg)  03/27/19 195 lb (88.5 kg)  02/13/19 196 lb 2 oz (89 kg)     Other studies personally reviewed: Additional studies/ records that were reviewed today include: ECG 04/24/19  Review of the above records today demonstrates: Sinus rhythm, first-degree AV block   ASSESSMENT & PLAN:    1.  Persistent atrial fibrillation: Currently on flecainide and Xarelto.  He had an attempted ablation, but unfortunately thrombus  was found in the left atrial appendage.  He was switched to Xarelto from Eliquis.  He wishes for repeat attempted ablation.  We Dafne Nield plan for TEE on the table.  Risks and benefits were discussed which include bleeding, tamponade, heart block, stroke, damage surrounding organs.  He understands these risks and is agreed to the procedure.  CHA2DS2-VASc of 1.  2.  Hyperlipidemia: Continue atorvastatin per primary care  COVID 19 screen The patient denies symptoms of COVID 19 at this time.  The importance of social distancing was discussed today.  Follow-up: 3 months   Current medicines are reviewed at length with the patient today.   The patient does not have concerns regarding his medicines.  The following changes were made today:  none  Labs/ tests ordered today include:  No orders of the defined types were placed in this encounter.    Patient Risk:  after full review of this patients clinical status, I feel that they are at moderate risk at this time.  Today, I have spent 8 minutes with  the patient with telehealth technology discussing AF .    Signed, Samar Venneman Meredith Leeds, MD  06/04/2019 3:06 PM     [address] [place] [address] [phone] (office) [phone] (fax)

## 2019-06-14 NOTE — Telephone Encounter (Signed)
Reviewed procedure (TEE/Ablation) instructions w/ pt, also sent via mychart. Covid screening scheduled for 5/3. Aware office will call to arrange post procedure follow up. Patient verbalized understanding and agreeable to plan.

## 2019-06-14 NOTE — Telephone Encounter (Signed)
Michael Li is calling due to still not being called in regards to his ablation that is scheduled for Wednesday 06/19/19. Please advise.

## 2019-06-17 ENCOUNTER — Other Ambulatory Visit (HOSPITAL_COMMUNITY)
Admission: RE | Admit: 2019-06-17 | Discharge: 2019-06-17 | Disposition: A | Discharge status: Home / Self Care | Payer: 59 | Source: Ambulatory Visit | Attending: Cardiology | Admitting: Cardiology

## 2019-06-17 DIAGNOSIS — Z20822 Contact with and (suspected) exposure to covid-19: Secondary | ICD-10-CM | POA: Insufficient documentation

## 2019-06-17 DIAGNOSIS — Z01812 Encounter for preprocedural laboratory examination: Secondary | ICD-10-CM | POA: Diagnosis present

## 2019-06-17 LAB — SARS CORONAVIRUS 2 (TAT 6-24 HRS): SARS Coronavirus 2: NEGATIVE

## 2019-06-19 ENCOUNTER — Ambulatory Visit (HOSPITAL_COMMUNITY): Payer: 59

## 2019-06-19 ENCOUNTER — Other Ambulatory Visit: Payer: Self-pay

## 2019-06-19 ENCOUNTER — Ambulatory Visit (HOSPITAL_COMMUNITY)
Admission: RE | Admit: 2019-06-19 | Discharge: 2019-06-19 | Disposition: A | Discharge status: Home / Self Care | Payer: 59 | Attending: Cardiology | Admitting: Cardiology

## 2019-06-19 ENCOUNTER — Encounter (HOSPITAL_COMMUNITY)
Admission: RE | Disposition: A | Discharge status: Home / Self Care | Payer: 59 | Source: Home / Self Care | Attending: Cardiology

## 2019-06-19 ENCOUNTER — Ambulatory Visit (HOSPITAL_COMMUNITY): Payer: 59 | Admitting: Anesthesiology

## 2019-06-19 DIAGNOSIS — E785 Hyperlipidemia, unspecified: Secondary | ICD-10-CM | POA: Diagnosis not present

## 2019-06-19 DIAGNOSIS — Z794 Long term (current) use of insulin: Secondary | ICD-10-CM | POA: Insufficient documentation

## 2019-06-19 DIAGNOSIS — Z87891 Personal history of nicotine dependence: Secondary | ICD-10-CM | POA: Insufficient documentation

## 2019-06-19 DIAGNOSIS — Z79899 Other long term (current) drug therapy: Secondary | ICD-10-CM | POA: Diagnosis not present

## 2019-06-19 DIAGNOSIS — Z7901 Long term (current) use of anticoagulants: Secondary | ICD-10-CM | POA: Insufficient documentation

## 2019-06-19 DIAGNOSIS — E119 Type 2 diabetes mellitus without complications: Secondary | ICD-10-CM | POA: Insufficient documentation

## 2019-06-19 DIAGNOSIS — K219 Gastro-esophageal reflux disease without esophagitis: Secondary | ICD-10-CM | POA: Insufficient documentation

## 2019-06-19 DIAGNOSIS — I4819 Other persistent atrial fibrillation: Secondary | ICD-10-CM

## 2019-06-19 HISTORY — PX: ATRIAL FIBRILLATION ABLATION: EP1191

## 2019-06-19 HISTORY — PX: TEE WITHOUT CARDIOVERSION: SHX5443

## 2019-06-19 LAB — POCT ACTIVATED CLOTTING TIME
Activated Clotting Time: 241 seconds
Activated Clotting Time: 268 seconds
Activated Clotting Time: 301 seconds
Activated Clotting Time: 323 seconds

## 2019-06-19 LAB — BASIC METABOLIC PANEL
Anion gap: 8 (ref 5–15)
BUN: 23 mg/dL (ref 8–23)
CO2: 23 mmol/L (ref 22–32)
Calcium: 9.2 mg/dL (ref 8.9–10.3)
Chloride: 109 mmol/L (ref 98–111)
Creatinine, Ser: 0.84 mg/dL (ref 0.61–1.24)
GFR calc Af Amer: >60 mL/min (ref >60)
GFR calc non Af Amer: >60 mL/min (ref >60)
Glucose, Bld: 158 mg/dL — ABNORMAL HIGH (ref 70–99)
Potassium: 4.1 mmol/L (ref 3.5–5.1)
Sodium: 140 mmol/L (ref 135–145)

## 2019-06-19 LAB — CBC
HCT: 48.1 % (ref 39.0–52.0)
Hemoglobin: 16 g/dL (ref 13.0–17.0)
MCH: 29.3 pg (ref 26.0–34.0)
MCHC: 33.3 g/dL (ref 30.0–36.0)
MCV: 87.9 fL (ref 80.0–100.0)
Platelets: 166 10*3/uL (ref 150–400)
RBC: 5.47 MIL/uL (ref 4.22–5.81)
RDW: 12.7 % (ref 11.5–15.5)
WBC: 4.8 10*3/uL (ref 4.0–10.5)
nRBC: 0 % (ref 0.0–0.2)

## 2019-06-19 LAB — GLUCOSE, CAPILLARY: Glucose-Capillary: 151 mg/dL — ABNORMAL HIGH (ref 70–99)

## 2019-06-19 SURGERY — ATRIAL FIBRILLATION ABLATION
Anesthesia: General

## 2019-06-19 MED ORDER — ACETAMINOPHEN 325 MG PO TABS
650.0000 mg | ORAL_TABLET | ORAL | Status: DC | PRN
  Filled 2019-06-19: qty 2

## 2019-06-19 MED ORDER — PROPOFOL 10 MG/ML IV BOLUS
INTRAVENOUS | Status: DC | PRN
  Administered 2019-06-19: 150 mg via INTRAVENOUS

## 2019-06-19 MED ORDER — HEPARIN (PORCINE) IN NACL 1000-0.9 UT/500ML-% IV SOLN
INTRAVENOUS | Status: AC
  Filled 2019-06-19: qty 500

## 2019-06-19 MED ORDER — MIDAZOLAM HCL 5 MG/5ML IJ SOLN
INTRAMUSCULAR | Status: DC | PRN
  Administered 2019-06-19: 2 mg via INTRAVENOUS

## 2019-06-19 MED ORDER — DOBUTAMINE IN D5W 4-5 MG/ML-% IV SOLN
INTRAVENOUS | Status: AC
  Filled 2019-06-19: qty 250

## 2019-06-19 MED ORDER — SUGAMMADEX SODIUM 200 MG/2ML IV SOLN
INTRAVENOUS | Status: DC | PRN
  Administered 2019-06-19: 200 mg via INTRAVENOUS

## 2019-06-19 MED ORDER — HEPARIN SODIUM (PORCINE) 1000 UNIT/ML IJ SOLN
INTRAMUSCULAR | Status: DC | PRN
  Administered 2019-06-19: 3000 [IU] via INTRAVENOUS
  Administered 2019-06-19 (×2): 6000 [IU] via INTRAVENOUS
  Administered 2019-06-19: 14000 [IU] via INTRAVENOUS

## 2019-06-19 MED ORDER — ONDANSETRON HCL 4 MG/2ML IJ SOLN
INTRAMUSCULAR | Status: DC | PRN
  Administered 2019-06-19: 4 mg via INTRAVENOUS

## 2019-06-19 MED ORDER — ROCURONIUM BROMIDE 10 MG/ML (PF) SYRINGE
PREFILLED_SYRINGE | INTRAVENOUS | Status: DC | PRN
  Administered 2019-06-19: 20 mg via INTRAVENOUS
  Administered 2019-06-19: 40 mg via INTRAVENOUS

## 2019-06-19 MED ORDER — LIDOCAINE 2% (20 MG/ML) 5 ML SYRINGE
INTRAMUSCULAR | Status: DC | PRN
  Administered 2019-06-19: 80 mg via INTRAVENOUS

## 2019-06-19 MED ORDER — ALBUMIN HUMAN 5 % IV SOLN: INTRAVENOUS | Status: DC | PRN

## 2019-06-19 MED ORDER — PHENYLEPHRINE HCL-NACL 10-0.9 MG/250ML-% IV SOLN
INTRAVENOUS | Status: DC | PRN
  Administered 2019-06-19: 20 ug/min via INTRAVENOUS

## 2019-06-19 MED ORDER — FENTANYL CITRATE (PF) 250 MCG/5ML IJ SOLN
INTRAMUSCULAR | Status: DC | PRN
  Administered 2019-06-19: 100 ug via INTRAVENOUS

## 2019-06-19 MED ORDER — DOBUTAMINE IN D5W 4-5 MG/ML-% IV SOLN
INTRAVENOUS | Status: DC | PRN
  Administered 2019-06-19: 20 ug/kg/min via INTRAVENOUS

## 2019-06-19 MED ORDER — HEPARIN SODIUM (PORCINE) 1000 UNIT/ML IJ SOLN
INTRAMUSCULAR | Status: AC
  Filled 2019-06-19: qty 1

## 2019-06-19 MED ORDER — HEPARIN (PORCINE) IN NACL 1000-0.9 UT/500ML-% IV SOLN
INTRAVENOUS | Status: DC | PRN
  Administered 2019-06-19 (×5): 500 mL

## 2019-06-19 MED ORDER — PHENYLEPHRINE 40 MCG/ML (10ML) SYRINGE FOR IV PUSH (FOR BLOOD PRESSURE SUPPORT)
PREFILLED_SYRINGE | INTRAVENOUS | Status: DC | PRN
  Administered 2019-06-19: 120 ug via INTRAVENOUS
  Administered 2019-06-19: 80 ug via INTRAVENOUS

## 2019-06-19 MED ORDER — ESMOLOL HCL 100 MG/10ML IV SOLN
INTRAVENOUS | Status: DC | PRN
  Administered 2019-06-19: 20 mg via INTRAVENOUS

## 2019-06-19 MED ORDER — SODIUM CHLORIDE 0.9 % IV SOLN: INTRAVENOUS | Status: DC

## 2019-06-19 MED ORDER — LACTATED RINGERS IV SOLN
INTRAVENOUS | Status: DC | PRN
Start: 2019-06-19 — End: 2019-06-19

## 2019-06-19 MED ORDER — PROTAMINE SULFATE 10 MG/ML IV SOLN
INTRAVENOUS | Status: DC | PRN
  Administered 2019-06-19: 10 mg via INTRAVENOUS
  Administered 2019-06-19 (×2): 20 mg via INTRAVENOUS

## 2019-06-19 MED ORDER — ONDANSETRON HCL 4 MG/2ML IJ SOLN: 4.0000 mg | Freq: Four times a day (QID) | INTRAMUSCULAR | Status: DC | PRN

## 2019-06-19 MED ORDER — DEXMEDETOMIDINE HCL 200 MCG/2ML IV SOLN
INTRAVENOUS | Status: DC | PRN
  Administered 2019-06-19: 8 ug via INTRAVENOUS
  Administered 2019-06-19: 4 ug via INTRAVENOUS

## 2019-06-19 SURGICAL SUPPLY — 20 items
BAG SNAP BAND KOVER 36X36 (MISCELLANEOUS) ×1 IMPLANT
BLANKET WARM UNDERBOD FULL ACC (MISCELLANEOUS) ×2 IMPLANT
CATH MAPPNG PENTARAY F 2-6-2MM (CATHETERS) IMPLANT
CATH SMTCH THERMOCOOL SF DF (CATHETERS) ×1 IMPLANT
CATH SOUNDSTAR ECO 8FR (CATHETERS) ×1 IMPLANT
CATH WEBSTER BI DIR CS D-F CRV (CATHETERS) ×1 IMPLANT
COVER SWIFTLINK CONNECTOR (BAG) ×2 IMPLANT
DEVICE CLOSURE PERCLS PRGLD 6F (VASCULAR PRODUCTS) IMPLANT
PACK EP LATEX FREE (CUSTOM PROCEDURE TRAY) ×2
PACK EP LF (CUSTOM PROCEDURE TRAY) ×1 IMPLANT
PAD PRO RADIOLUCENT 2001M-C (PAD) ×2 IMPLANT
PENTARAY F 2-6-2MM (CATHETERS) ×2
PERCLOSE PROGLIDE 6F (VASCULAR PRODUCTS) ×8
SHEATH BAYLIS SUREFLEX  M 8.5 (SHEATH) ×2
SHEATH BAYLIS SUREFLEX M 8.5 (SHEATH) IMPLANT
SHEATH BAYLIS TRANSSEPTAL 98CM (NEEDLE) ×1 IMPLANT
SHEATH CARTO VIZIGO SM CVD (SHEATH) ×1 IMPLANT
SHEATH PINNACLE 7F 10CM (SHEATH) ×1 IMPLANT
SHEATH PINNACLE 8F 10CM (SHEATH) ×2 IMPLANT
SHEATH PINNACLE 9F 10CM (SHEATH) ×1 IMPLANT

## 2019-06-19 NOTE — Anesthesia Preprocedure Evaluation (Signed)
Anesthesia Evaluation  Patient identified by MRN, date of birth, ID band Patient awake    Reviewed: Allergy & Precautions, NPO status , Patient's Chart, lab work & pertinent test results  Airway Mallampati: II  TM Distance: >3 FB     Dental   Pulmonary shortness of breath, former smoker,    breath sounds clear to auscultation       Cardiovascular + dysrhythmias  Rhythm:Regular Rate:Normal     Neuro/Psych PSYCHIATRIC DISORDERS    GI/Hepatic GERD  ,(+) Hepatitis -  Endo/Other  diabetes  Renal/GU      Musculoskeletal   Abdominal   Peds  Hematology  (+) anemia ,   Anesthesia Other Findings   Reproductive/Obstetrics                             Anesthesia Physical Anesthesia Plan  ASA: III  Anesthesia Plan: General   Post-op Pain Management:    Induction: Intravenous  PONV Risk Score and Plan: 2 and Dexamethasone, Ondansetron and Midazolam  Airway Management Planned: Oral ETT  Additional Equipment:   Intra-op Plan:   Post-operative Plan: Possible Post-op intubation/ventilation  Informed Consent: I have reviewed the patients History and Physical, chart, labs and discussed the procedure including the risks, benefits and alternatives for the proposed anesthesia with the patient or authorized representative who has indicated his/her understanding and acceptance.     Dental advisory given  Plan Discussed with: CRNA and Anesthesiologist  Anesthesia Plan Comments:         Anesthesia Quick Evaluation

## 2019-06-19 NOTE — Transfer of Care (Signed)
Immediate Anesthesia Transfer of Care Note  Patient: Michael Li  Procedure(s) Performed: ATRIAL FIBRILLATION ABLATION (N/A ) TRANSESOPHAGEAL ECHOCARDIOGRAM (TEE) (N/A )  Patient Location: Cath Lab  Anesthesia Type:General  Level of Consciousness: drowsy and patient cooperative  Airway & Oxygen Therapy: Patient Spontanous Breathing and Patient connected to face mask oxygen  Post-op Assessment: Report given to RN and Post -op Vital signs reviewed and stable  Post vital signs: Reviewed and stable  Last Vitals:  Vitals Value Taken Time  BP 102/62 06/19/19 1621  Temp    Pulse 74 06/19/19 1621  Resp 16 06/19/19 1621  SpO2 96 % 06/19/19 1621  Vitals shown include unvalidated device data.  Last Pain:  Vitals:   06/19/19 0935  TempSrc: Skin  PainSc: 0-No pain         Complications: No apparent anesthesia complications

## 2019-06-19 NOTE — Progress Notes (Signed)
Per Laura,RN in holding area, Dr Curt Bears already saw client in holding area and per Dr Curt Bears, client may be discharged after bedrest

## 2019-06-19 NOTE — Anesthesia Postprocedure Evaluation (Signed)
Anesthesia Post Note  Patient: Michael Li  Procedure(s) Performed: ATRIAL FIBRILLATION ABLATION (N/A ) TRANSESOPHAGEAL ECHOCARDIOGRAM (TEE) (N/A )     Anesthesia Post Evaluation  Last Vitals:  Vitals:   06/19/19 1725 06/19/19 1740  BP: (!) 101/59 100/64  Pulse: 73 72  Resp: 14 16  Temp:  (!) 36.3 C  SpO2: 97% 92%    Last Pain:  Vitals:   06/19/19 1627  TempSrc:   PainSc: 0-No pain                 Elecia Serafin

## 2019-06-19 NOTE — CV Procedure (Signed)
    Transesophageal Echocardiogram Note  Michael Li [id number] [birth year]  Procedure: Transesophageal Echocardiogram Indications: atrial fib , pre-ablation   Procedure Details Consent: Obtained Time Out: Verified patient identification, verified procedure, site/side was marked, verified correct patient position, special equipment/implants available, Radiology Safety Procedures followed,  medications/allergies/relevent history reviewed, required imaging and test results available.  Performed  Medications:  During this procedure the patient is administered a propofol drip by CRNA .   Left Ventrical:  Normal LV function   Mitral Valve:  Mild MR   Aortic Valve: normal   Tricuspid Valve:  Mild TR   Pulmonic Valve:   Left Atrium/ Left atrial appendage: no thrombi in the LA or LAA   Atrial septum: no ASD or PFO by color doppler   Aorta: normal    Complications: No apparent complications Patient did tolerate procedure well.  Pt is clear for aFib ablation    Michael Li., MD, [facility] 06/19/2019, 2:02 PM

## 2019-06-19 NOTE — Anesthesia Procedure Notes (Signed)
Procedure Name: Intubation Date/Time: 06/19/2019 12:40 PM Performed by: Renato Shin, CRNA Pre-anesthesia Checklist: Patient identified, Emergency Drugs available, Suction available and Patient being monitored Patient Re-evaluated:Patient Re-evaluated prior to induction Oxygen Delivery Method: Circle system utilized Preoxygenation: Pre-oxygenation with 100% oxygen Induction Type: IV induction Ventilation: Mask ventilation without difficulty Laryngoscope Size: Miller and 2 Grade View: Grade I Tube type: Oral Tube size: 7.0 mm Number of attempts: 1 Airway Equipment and Method: Stylet and Oral airway Placement Confirmation: ETT inserted through vocal cords under direct vision,  positive ETCO2 and breath sounds checked- equal and bilateral Secured at: 21 cm Tube secured with: Tape Dental Injury: Teeth and Oropharynx as per pre-operative assessment  Comments: Front lower loose tooth left unchanged

## 2019-06-19 NOTE — Progress Notes (Signed)
Up and walked and tolerated well; bilat groins stable, no bleeding or hematoma 

## 2019-06-19 NOTE — Discharge Instructions (Signed)
Femoral Site Care This sheet gives you information about how to care for yourself after your procedure. Your health care provider may also give you more specific instructions. If you have problems or questions, contact your health care provider. What can I expect after the procedure? After the procedure, it is common to have:  Bruising that usually fades within 1-2 weeks.  Tenderness at the site. Follow these instructions at home: Wound care  Follow instructions from your health care provider about how to take care of your insertion site. Make sure you: ? Wash your hands with soap and water before you change your bandage (dressing). If soap and water are not available, use hand sanitizer. ? Change your dressing as told by your health care provider. ? Leave stitches (sutures), skin glue, or adhesive strips in place. These skin closures may need to stay in place for 2 weeks or longer. If adhesive strip edges start to loosen and curl up, you may trim the loose edges. Do not remove adhesive strips completely unless your health care provider tells you to do that.  Do not take baths, swim, or use a hot tub until your health care provider approves.  You may shower 24-48 hours after the procedure or as told by your health care provider. ? Gently wash the site with plain soap and water. ? Pat the area dry with a clean towel. ? Do not rub the site. This may cause bleeding.  Do not apply powder or lotion to the site. Keep the site clean and dry.  Check your femoral site every day for signs of infection. Check for: ? Redness, swelling, or pain. ? Fluid or blood. ? Warmth. ? Pus or a bad smell. Activity  For the first 2-3 days after your procedure, or as long as directed: ? Avoid climbing stairs as much as possible. ? Do not squat.  Do not lift anything that is heavier than 10 lb (4.5 kg), or the limit that you are told, until your health care provider says that it is safe.  Rest as  directed. ? Avoid sitting for a long time without moving. Get up to take short walks every 1-2 hours.  Do not drive for 24 hours if you were given a medicine to help you relax (sedative). General instructions  Take over-the-counter and prescription medicines only as told by your health care provider.  Keep all follow-up visits as told by your health care provider. This is important. Contact a health care provider if you have:  A fever or chills.  You have redness, swelling, or pain around your insertion site. Get help right away if:  The catheter insertion area swells very fast.  You pass out.  You suddenly start to sweat or your skin gets clammy.  The catheter insertion area is bleeding, and the bleeding does not stop when you hold steady pressure on the area.  The area near or just beyond the catheter insertion site becomes pale, cool, tingly, or numb. These symptoms may represent a serious problem that is an emergency. Do not wait to see if the symptoms will go away. Get medical help right away. Call your local emergency services (911 in the [place] Do not drive yourself to the hospital. Summary  After the procedure, it is common to have bruising that usually fades within 1-2 weeks.  Check your femoral site every day for signs of infection.  Do not lift anything that is heavier than 10 lb (4.5 kg), or the   limit that you are told, until your health care provider says that it is safe. This information is not intended to replace advice given to you by your health care provider. Make sure you discuss any questions you have with your health care provider. Document Revised: 02/13/2017 Document Reviewed: 02/13/2017 Elsevier Patient Education  2020 [place] procedure care instructions No driving for 4 days. No lifting over 5 lbs for 1 week. No vigorous ur sexual activity for 1 week. You may return to work/your usual activities on 06/26/2019. Keep procedure site clean &  dry. If you notice increased pain, swelling, bleeding or pus, call/return!  You may shower, but no soaking baths/hot tubs/pools for 1 week.     You have an appointment set up with the [facility].  Multiple studies have shown that being followed by a dedicated atrial fibrillation clinic in addition to the standard care you receive from your other physicians improves health. We believe that enrollment in the atrial fibrillation clinic will allow [place] to better care for you.   The phone number to the [facility] is [phone]. The clinic is staffed Monday through Friday from 8:30am to 5pm.  Parking Directions: The clinic is located in the Heart and Vascular Building connected to [facility]. 1)From [address] turn on to [address] and go to the 3rd entrance  (Heart and Vascular entrance) on the right. 2)Look to the right for Heart &Vascular Parking Garage. 3)A code for the entrance is required, for May is 5008 4)Take the elevators to the 1st floor. Registration is in the room with the glass walls at the end of the hallway.  If you have any trouble parking or locating the clinic, please don't hesitate to call [phone].

## 2019-06-19 NOTE — H&P (Signed)
Michael Li has presented today for surgery, with the diagnosis of atrial fibrillaiton.  The various methods of treatment have been discussed with the patient and family. After consideration of risks, benefits and other options for treatment, the patient has consented to  Procedure(s): Catheter ablation as a surgical intervention .  Risks include but not limited to bleeding, tamponade, heart block, stroke, damage to surrounding organs, among others. The patient's history has been reviewed, patient examined, no change in status, stable for surgery.  I have reviewed the patient's chart and labs.  Questions were answered to the patient's satisfaction.    Mariame Rybolt Curt Bears, MD 06/19/2019 11:51 AM

## 2019-06-21 ENCOUNTER — Other Ambulatory Visit: Payer: Self-pay | Admitting: Internal Medicine

## 2019-06-21 DIAGNOSIS — F900 Attention-deficit hyperactivity disorder, predominantly inattentive type: Secondary | ICD-10-CM

## 2019-06-21 MED ORDER — AMPHETAMINE-DEXTROAMPHET ER 30 MG PO CP24: 30.0000 mg | ORAL_CAPSULE | ORAL | 0 refills | Status: DC

## 2019-06-21 MED ORDER — COLCHICINE 0.6 MG PO TABS
0.6000 mg | ORAL_TABLET | Freq: Two times a day (BID) | ORAL | 0 refills | Status: DC
Start: 2019-06-21 — End: 2019-07-31

## 2019-06-21 MED ORDER — AMPHETAMINE-DEXTROAMPHETAMINE 20 MG PO TABS: 20.0000 mg | ORAL_TABLET | Freq: Every day | ORAL | 0 refills | Status: DC | PRN

## 2019-06-21 NOTE — Telephone Encounter (Signed)
Called and spoke with pt who reports he is doing so much better today.  States yesterday he felt terrible, experienced chest discomfort.  He reported that it felt like it was in his lungs. Today he is not experiencing issues  Per Dr. Curt Bears:  Pt advised that if issue reoccurs in next day/two then he can take Colchicine 0.6 mg BID for 10 days.  Advised if diarrhea SE occurs after starting med then reduce to once a day.   Pt advised that Rx will be sent to pharmacy in case he needs it over the weekend.  He is aware if he does not start taking the medication over weekend and he experiences any further discomfort next week, to not take medication but call office to discuss treatment plan.   Patient verbalized understanding and agreeable to plan.

## 2019-06-24 LAB — ECHO INTRAOPERATIVE TEE
Height: 68 in
Weight: 3040 oz

## 2019-07-09 ENCOUNTER — Ambulatory Visit: Payer: 59 | Admitting: Cardiology

## 2019-07-17 ENCOUNTER — Other Ambulatory Visit: Payer: Self-pay

## 2019-07-17 ENCOUNTER — Encounter (HOSPITAL_COMMUNITY): Payer: Self-pay | Admitting: Nurse Practitioner

## 2019-07-17 ENCOUNTER — Ambulatory Visit (HOSPITAL_COMMUNITY)
Admission: RE | Admit: 2019-07-17 | Discharge: 2019-07-17 | Disposition: A | Discharge status: Home / Self Care | Payer: 59 | Source: Ambulatory Visit | Attending: Nurse Practitioner | Admitting: Nurse Practitioner

## 2019-07-17 VITALS — BP 130/74 | HR 75 | Ht 68.0 in | Wt 186.6 lb

## 2019-07-17 DIAGNOSIS — D6869 Other thrombophilia: Secondary | ICD-10-CM

## 2019-07-17 DIAGNOSIS — Z8249 Family history of ischemic heart disease and other diseases of the circulatory system: Secondary | ICD-10-CM | POA: Insufficient documentation

## 2019-07-17 DIAGNOSIS — Z881 Allergy status to other antibiotic agents status: Secondary | ICD-10-CM | POA: Diagnosis not present

## 2019-07-17 DIAGNOSIS — Z794 Long term (current) use of insulin: Secondary | ICD-10-CM | POA: Insufficient documentation

## 2019-07-17 DIAGNOSIS — I4819 Other persistent atrial fibrillation: Secondary | ICD-10-CM

## 2019-07-17 DIAGNOSIS — Z79899 Other long term (current) drug therapy: Secondary | ICD-10-CM | POA: Insufficient documentation

## 2019-07-17 DIAGNOSIS — Z87891 Personal history of nicotine dependence: Secondary | ICD-10-CM | POA: Insufficient documentation

## 2019-07-17 DIAGNOSIS — Z833 Family history of diabetes mellitus: Secondary | ICD-10-CM | POA: Diagnosis not present

## 2019-07-17 DIAGNOSIS — Z825 Family history of asthma and other chronic lower respiratory diseases: Secondary | ICD-10-CM | POA: Insufficient documentation

## 2019-07-17 DIAGNOSIS — Z7901 Long term (current) use of anticoagulants: Secondary | ICD-10-CM | POA: Insufficient documentation

## 2019-07-17 DIAGNOSIS — I4892 Unspecified atrial flutter: Secondary | ICD-10-CM | POA: Diagnosis not present

## 2019-07-17 DIAGNOSIS — E119 Type 2 diabetes mellitus without complications: Secondary | ICD-10-CM | POA: Insufficient documentation

## 2019-07-17 DIAGNOSIS — K219 Gastro-esophageal reflux disease without esophagitis: Secondary | ICD-10-CM | POA: Insufficient documentation

## 2019-07-17 DIAGNOSIS — Z85828 Personal history of other malignant neoplasm of skin: Secondary | ICD-10-CM | POA: Diagnosis not present

## 2019-07-17 DIAGNOSIS — I4891 Unspecified atrial fibrillation: Secondary | ICD-10-CM | POA: Insufficient documentation

## 2019-07-17 DIAGNOSIS — Z8619 Personal history of other infectious and parasitic diseases: Secondary | ICD-10-CM | POA: Diagnosis not present

## 2019-07-17 NOTE — Progress Notes (Signed)
Primary Care Physician: Janith Lima, MD Referring Physician: Dr. Mayra Neer Michael Li is a 64 y.o. male with a h/o paroxysmal afib since 2018. He initially saw Dr. Bettina Gavia in July and was started on Flecainide 50 mg bid. He saw Dr. Curt Bears in early September. Flecainde was increased to 100 mg bid and he returned for a repeat EKG. This showed afib, rate controlled and Dr. Curt Bears referred here for cardioversion.  Pt denies and significant alcohol, caffeine use, no tobacco use. He does not have symptoms of sleep apnea.He likes to daily walk his dog but has not been able to for some months for exertional dyspnea.  F/u in afib clinic, 11/6. His flecainide was increased to 100 mg bid and he went for DCCV. Fortunately, he was in rhythm on presentation for DCCV and it was cancelled. He is continues in SR. He states that he feels no different in SR that he did in afib.   F/u in afib clinic, 3/10.He saw Dr. Curt Bears on f/u and was found to be in afib and he was scheduled for ablation. 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  He was switched from eliquis to [place]. He missed one dose 2 weeks ago, He is in SR today and would like to be considered for rescheduling of ablation.  F/u in afib clinic, 07/17/19, one month afterafib  Ablation with Dr. Curt Bears. He is staying in [place]. He feels he has very mild shortness of breath.  No swallowing or groin issues. He had some initial chest pain after ablation that responded to one ibuprofen and some groin bruising that resolved after a few days.   Today, he denies symptoms of palpitations, chest pain, shortness of breath, orthopnea, PND, lower extremity edema, dizziness, presyncope, syncope, or neurologic sequela. The patient is tolerating medications without difficulties and is otherwise without complaint today.   Past Medical History:  Diagnosis Date  . Anemia    rec'd 32 units of blood, post T&A, 1981  .  ATTENTION DEFICIT HYPERACTIVITY DISORDER 07/19/2006  . BENIGN PROSTATIC HYPERTROPHY 07/19/2006  . DIABETES MELLITUS, TYPE II 07/19/2006  . Dyspnea    had shortness of breath when heart was irregular  . Dysrhythmia    history of a-fib  . ERECTILE DYSFUNCTION 07/19/2006  . GERD 07/19/2006  . HEPATITIS C, CHRONIC 04/03/2007   Treated with [place]   . History of blood transfusion   . NEOPLASM, MALIGNANT, CARCINOMA, BASAL CELL, NOSE 10/20/2009   Basal cell   Past Surgical History:  Procedure Laterality Date  . APPENDECTOMY    . ATRIAL FIBRILLATION ABLATION N/A 03/27/2019   Procedure: ATRIAL FIBRILLATION ABLATION;  Surgeon: Constance Haw, MD;  Location: [facility];  Service: Cardiovascular;  Laterality: N/A;  . ATRIAL FIBRILLATION ABLATION N/A 06/19/2019   Procedure: ATRIAL FIBRILLATION ABLATION;  Surgeon: Constance Haw, MD;  Location: [facility];  Service: Cardiovascular;  Laterality: N/A;  . carotid injury       post tonsillectomy, R carotid injury with trach  . HERNIA REPAIR  1973   inguinal- R side  . KNEE SURGERY Right   . LUMBAR LAMINECTOMY/DECOMPRESSION MICRODISCECTOMY Right 09/12/2012   Procedure: LUMBAR LAMINECTOMY/DECOMPRESSION MICRODISCECTOMY 1 LEVEL;  Surgeon: Charlie Pitter, MD;  Location: [facility];  Service: Neurosurgery;  Laterality: Right;  Right lumbar three-four microdiscectomy  . ORIF SHOULDER FRACTURE Right 06/02/2017   Procedure: OPEN REDUCTION INTERNAL FIXATION (ORIF) RIGHT GLENOID  FRACTURE, POSSIBLE LATARJET CORACOID TRANSFER;  Surgeon: Meredith Pel, MD;  Location: [facility];  Service: Orthopedics;  Laterality: Right;  . TEE WITHOUT CARDIOVERSION N/A 06/19/2019   Procedure: TRANSESOPHAGEAL ECHOCARDIOGRAM (TEE);  Surgeon: Thayer Headings, MD;  Location: [facility];  Service: Cardiovascular;  Laterality: N/A;  . TONSILLECTOMY    . TRACHEOSTOMY CLOSURE  1981    Current Outpatient Medications  Medication Sig Dispense Refill  . alfuzosin  (UROXATRAL) 10 MG 24 hr tablet Take 1 tablet (10 mg total) by mouth daily with breakfast. 90 tablet 1  . amphetamine-dextroamphetamine (ADDERALL XR) 30 MG 24 hr capsule Take 1 capsule (30 mg total) by mouth every morning. 30 capsule 0  . amphetamine-dextroamphetamine (ADDERALL) 20 MG tablet Take 1 tablet (20 mg total) by mouth daily as needed (in the afternoon if needed for focus.). 30 tablet 0  . atorvastatin (LIPITOR) 10 MG tablet Take 1 tablet (10 mg total) by mouth daily. 90 tablet 1  . BD PEN NEEDLE MICRO U/F 32G X 6 MM MISC USE 2 PEN NEEDLES DAILY 200 each 3  . canagliflozin (INVOKANA) 300 MG TABS tablet Take 1 tablet (300 mg total) by mouth daily before breakfast. 90 tablet 1  . colchicine 0.6 MG tablet Take 1 tablet (0.6 mg total) by mouth 2 (two) times daily. For 10 days 20 tablet 0  . Continuous Blood Gluc Receiver (FREESTYLE LIBRE 14 DAY READER) DEVI 1 Act by Does not apply route daily. 1 each 4  . Continuous Blood Gluc Sensor (FREESTYLE LIBRE 14 DAY SENSOR) MISC 1 Act by Does not apply route daily. 1 each 5  . Insulin Glargine, 1 Unit Dial, (TOUJEO SOLOSTAR) 300 UNIT/ML SOPN Inject 20 Units into the skin daily. (Patient taking differently: Inject 20 Units into the skin every morning. ) 4.5 mL 1  . metFORMIN (GLUCOPHAGE) 1000 MG tablet Take 1 tablet (1,000 mg total) by mouth 2 (two) times daily with a meal. 180 tablet 3  . metoprolol tartrate (LOPRESSOR) 25 MG tablet Take 0.5 tablets (12.5 mg total) by mouth 2 (two) times daily. 180 tablet 0  . omeprazole (PRILOSEC) 20 MG capsule Take 1 capsule (20 mg total) by mouth every evening. 90 capsule 1  . rivaroxaban (XARELTO) 20 MG TABS tablet Take 1 tablet (20 mg total) by mouth daily with supper. 30 tablet 6  . tadalafil (CIALIS) 20 MG tablet Take 1 tablet (20 mg total) by mouth daily as needed for erectile dysfunction. 10 tablet 5   No current facility-administered medications for this encounter.    Allergies  Allergen Reactions  .  Cephalexin Anaphylaxis, Swelling and Other (See Comments)    Swelling in throat    Social History   Socioeconomic History  . Marital status: Married    Spouse name: Not on file  . Number of children: 2  . Years of education: Not on file  . Highest education level: Not on file  Occupational History  . Occupation: Best boy: [employer]  Tobacco Use  . Smoking status: Former Smoker    Years: 34.00    Types: Cigarettes    Quit date: 2007    Years since quitting: 14.4  . Smokeless tobacco: Former Systems developer    Types: [place] date: 02/15/2007  Substance and Sexual Activity  . Alcohol use: Yes    Comment: 3 glasses of wine per week   . Drug use: No  . Sexual activity: Yes  Partners: Male  Other Topics Concern  . Not on file  Social History Narrative   Regular exercise-yes   Social Determinants of Health   Financial Resource Strain:   . Difficulty of Paying Living Expenses:   Food Insecurity:   . Worried About Charity fundraiser in the Last Year:   . Arboriculturist in the Last Year:   Transportation Needs:   . Film/video editor (Medical):   Marland Kitchen Lack of Transportation (Non-Medical):   Physical Activity:   . Days of Exercise per Week:   . Minutes of Exercise per Session:   Stress:   . Feeling of Stress :   Social Connections:   . Frequency of Communication with Friends and Family:   . Frequency of Social Gatherings with Friends and Family:   . Attends Religious Services:   . Active Member of Clubs or Organizations:   . Attends Archivist Meetings:   Marland Kitchen Marital Status:   Intimate Partner Violence:   . Fear of Current or Ex-Partner:   . Emotionally Abused:   Marland Kitchen Physically Abused:   . Sexually Abused:     Family History  Problem Relation Age of Onset  . COPD Mother   . Heart attack Father   . Diabetes Maternal Aunt   . Cancer Paternal Grandmother        colon cancer  . Diabetes Maternal Aunt     ROS- All systems are  reviewed and negative except as per the HPI above  Physical Exam: Vitals:   07/17/19 0840  BP: 130/74  Pulse: 75  Weight: 84.6 kg  Height: 5\' 8"  (1.727 m)   Wt Readings from Last 3 Encounters:  07/17/19 84.6 kg  06/19/19 86.2 kg  04/24/19 87.8 kg    Labs: Lab Results  Component Value Date   NA 140 06/19/2019   K 4.1 06/19/2019   CL 109 06/19/2019   CO2 23 06/19/2019   GLUCOSE 158 (H) 06/19/2019   BUN 23 06/19/2019   CREATININE 0.84 06/19/2019   CALCIUM 9.2 06/19/2019   Lab Results  Component Value Date   INR 1.00 06/02/2017   Lab Results  Component Value Date   CHOL 117 02/13/2019   HDL 37.80 (L) 02/13/2019   LDLCALC 65 02/13/2019   TRIG 68.0 02/13/2019     GEN- The patient is well appearing, alert and oriented x 3 today.   Head- normocephalic, atraumatic Eyes-  Sclera clear, conjunctiva pink Ears- hearing intact Oropharynx- clear Neck- supple, no JVP Lymph- no cervical lymphadenopathy Lungs- Clear to ausculation bilaterally, normal work of breathing Heart- regular rate and rhythm, no murmurs, rubs or gallops, PMI not laterally displaced GI- soft, NT, ND, + BS Extremities- no clubbing, cyanosis, or edema MS- no significant deformity or atrophy Skin- no rash or lesion Psych- euthymic mood, full affect Neuro- strength and sensation are intact  EKG- NSR, normal ekg at 75 bpm. Pr int 184 ms, qrs int 96 ms, qtc 419 ms   Assessment and Plan: 1. Afib/flutter  Scheduled for ablation earlier in year but had Left atrial appendage thrombus Procedure cancelled and eliquis was changed to xarelto 20 mg daily  He did have successful ablation 06/19/19 Maintaining SR  2. CHA2DS2VASc score of 3 ( DM, thrombus) Continue xarelto  20  mg daily and reminded not to miss doses    F/u with Dr. Curt Bears 8/17  Butch Penny C. Venancio Chenier, [facility] [place], [place]  [id number] [phone]  

## 2019-07-22 ENCOUNTER — Other Ambulatory Visit: Payer: Self-pay | Admitting: Internal Medicine

## 2019-07-22 DIAGNOSIS — F900 Attention-deficit hyperactivity disorder, predominantly inattentive type: Secondary | ICD-10-CM

## 2019-07-25 ENCOUNTER — Encounter: Payer: Self-pay | Admitting: Internal Medicine

## 2019-07-29 ENCOUNTER — Telehealth: Payer: Self-pay

## 2019-07-29 ENCOUNTER — Other Ambulatory Visit: Payer: Self-pay | Admitting: Internal Medicine

## 2019-07-29 DIAGNOSIS — F900 Attention-deficit hyperactivity disorder, predominantly inattentive type: Secondary | ICD-10-CM

## 2019-07-29 MED ORDER — AMPHETAMINE-DEXTROAMPHETAMINE 20 MG PO TABS: 20.0000 mg | ORAL_TABLET | Freq: Every day | ORAL | 0 refills | Status: DC | PRN

## 2019-07-29 MED ORDER — AMPHETAMINE-DEXTROAMPHET ER 30 MG PO CP24: 30.0000 mg | ORAL_CAPSULE | ORAL | 0 refills | Status: DC

## 2019-07-29 NOTE — Telephone Encounter (Signed)
The patient requested an telephone messages to be sent around to the [place]  1.Medication Requested:amphetamine-dextroamphetamine (ADDERALL XR) 30 MG 24 hr capsule amphetamine-dextroamphetamine (ADDERALL) 20 MG tablet  2. Pharmacy (Name, Street, City):[facility] [place], [place]  3. On Med List: Yes   4. Last Visit with PCP: 6.23.20  5. Next visit date with PCP: 6.16.21    Agent: Please be advised that RX refills may take up to 3 business days. We ask that you follow-up with your pharmacy.

## 2019-07-29 NOTE — Telephone Encounter (Signed)
Pt has scheduled an appointment for 07/31/2019 and is requesting a refill of Adderall.   Please advise if this can be done.

## 2019-07-31 ENCOUNTER — Other Ambulatory Visit: Payer: Self-pay

## 2019-07-31 ENCOUNTER — Other Ambulatory Visit: Payer: Self-pay | Admitting: Internal Medicine

## 2019-07-31 ENCOUNTER — Encounter: Payer: Self-pay | Admitting: Internal Medicine

## 2019-07-31 ENCOUNTER — Ambulatory Visit (INDEPENDENT_AMBULATORY_CARE_PROVIDER_SITE_OTHER): Payer: 59 | Admitting: Internal Medicine

## 2019-07-31 VITALS — BP 144/82 | HR 68 | Temp 97.6°F | Resp 16 | Ht 68.0 in | Wt 189.0 lb

## 2019-07-31 DIAGNOSIS — N5201 Erectile dysfunction due to arterial insufficiency: Secondary | ICD-10-CM

## 2019-07-31 DIAGNOSIS — I10 Essential (primary) hypertension: Secondary | ICD-10-CM

## 2019-07-31 DIAGNOSIS — I480 Paroxysmal atrial fibrillation: Secondary | ICD-10-CM

## 2019-07-31 DIAGNOSIS — E785 Hyperlipidemia, unspecified: Secondary | ICD-10-CM | POA: Diagnosis not present

## 2019-07-31 DIAGNOSIS — E118 Type 2 diabetes mellitus with unspecified complications: Secondary | ICD-10-CM

## 2019-07-31 DIAGNOSIS — F900 Attention-deficit hyperactivity disorder, predominantly inattentive type: Secondary | ICD-10-CM

## 2019-07-31 DIAGNOSIS — Z23 Encounter for immunization: Secondary | ICD-10-CM | POA: Diagnosis not present

## 2019-07-31 DIAGNOSIS — N401 Enlarged prostate with lower urinary tract symptoms: Secondary | ICD-10-CM | POA: Diagnosis not present

## 2019-07-31 DIAGNOSIS — R3911 Hesitancy of micturition: Secondary | ICD-10-CM

## 2019-07-31 DIAGNOSIS — K219 Gastro-esophageal reflux disease without esophagitis: Secondary | ICD-10-CM

## 2019-07-31 LAB — POCT GLYCOSYLATED HEMOGLOBIN (HGB A1C): Hemoglobin A1C: 6.4 % — AB (ref 4.0–5.6)

## 2019-07-31 MED ORDER — CANAGLIFLOZIN 300 MG PO TABS: 300.0000 mg | ORAL_TABLET | Freq: Every day | ORAL | 1 refills | Status: DC

## 2019-07-31 MED ORDER — ALFUZOSIN HCL ER 10 MG PO TB24: 10.0000 mg | ORAL_TABLET | Freq: Every day | ORAL | 1 refills | Status: DC

## 2019-07-31 MED ORDER — AMPHETAMINE-DEXTROAMPHET ER 30 MG PO CP24: 30.0000 mg | ORAL_CAPSULE | ORAL | 0 refills | Status: DC

## 2019-07-31 MED ORDER — METOPROLOL TARTRATE 25 MG PO TABS: 12.5000 mg | ORAL_TABLET | Freq: Two times a day (BID) | ORAL | 1 refills | Status: DC

## 2019-07-31 MED ORDER — AMPHETAMINE-DEXTROAMPHETAMINE 20 MG PO TABS: 20.0000 mg | ORAL_TABLET | Freq: Every day | ORAL | 0 refills | Status: DC | PRN

## 2019-07-31 MED ORDER — TADALAFIL 20 MG PO TABS: 20.0000 mg | ORAL_TABLET | Freq: Every day | ORAL | 5 refills | Status: DC | PRN

## 2019-07-31 MED ORDER — ATORVASTATIN CALCIUM 10 MG PO TABS: 10.0000 mg | ORAL_TABLET | Freq: Every day | ORAL | 1 refills | Status: DC

## 2019-07-31 MED ORDER — METFORMIN HCL 1000 MG PO TABS: 1000.0000 mg | ORAL_TABLET | Freq: Two times a day (BID) | ORAL | 3 refills | Status: DC

## 2019-07-31 MED ORDER — OMEPRAZOLE 20 MG PO CPDR: 20.0000 mg | DELAYED_RELEASE_CAPSULE | Freq: Every evening | ORAL | 1 refills | Status: DC

## 2019-07-31 MED ORDER — EDARBI 40 MG PO TABS: 1.0000 | ORAL_TABLET | Freq: Every day | ORAL | 1 refills | Status: DC

## 2019-07-31 MED ORDER — RIVAROXABAN 20 MG PO TABS: 20.0000 mg | ORAL_TABLET | Freq: Every day | ORAL | 1 refills | Status: DC

## 2019-07-31 NOTE — Patient Instructions (Signed)

## 2019-07-31 NOTE — Progress Notes (Signed)
Subjective:  Patient ID: Michael Li, male    DOB: 16-Oct-1955  Age: 64 y.o. MRN: [id number]  CC: Hypertension, Diabetes, and ADHD  This visit occurred during the SARS-CoV-2 public health emergency.  Safety protocols were in place, including screening questions prior to the visit, additional usage of staff PPE, and extensive cleaning of exam room while observing appropriate contact time as indicated for disinfecting solutions.    HPI Michael Li presents for f/up -  Pt states ADD status overall stable on current meds with overall good compliance and tolerability, and good effectiveness with respect to ability for concentration and task completion.  He has status post ablation and has been maintaining sinus rhythm.  He continues to be anticoagulated with the DOAC.  He tells me his blood sugars have been well controlled and he denies polys.  He does not monitor his blood pressure but denies any recent episodes of headache, blurred vision, chest pain, shortness of breath, or edema.  Outpatient Medications Prior to Visit  Medication Sig Dispense Refill  . BD PEN NEEDLE MICRO U/F 32G X 6 MM MISC USE 2 PEN NEEDLES DAILY 200 each 3  . Continuous Blood Gluc Receiver (FREESTYLE LIBRE 14 DAY READER) DEVI 1 Act by Does not apply route daily. 1 each 4  . Continuous Blood Gluc Sensor (FREESTYLE LIBRE 14 DAY SENSOR) MISC 1 Act by Does not apply route daily. 1 each 5  . Insulin Glargine, 1 Unit Dial, (TOUJEO SOLOSTAR) 300 UNIT/ML SOPN Inject 20 Units into the skin daily. (Patient taking differently: Inject 20 Units into the skin every morning. ) 4.5 mL 1  . alfuzosin (UROXATRAL) 10 MG 24 hr tablet Take 1 tablet (10 mg total) by mouth daily with breakfast. 90 tablet 1  . amphetamine-dextroamphetamine (ADDERALL XR) 30 MG 24 hr capsule Take 1 capsule (30 mg total) by mouth every morning. 30 capsule 0  . amphetamine-dextroamphetamine (ADDERALL) 20 MG tablet Take 1 tablet (20 mg total) by mouth daily as  needed (in the afternoon if needed for focus.). 30 tablet 0  . atorvastatin (LIPITOR) 10 MG tablet Take 1 tablet (10 mg total) by mouth daily. 90 tablet 1  . canagliflozin (INVOKANA) 300 MG TABS tablet Take 1 tablet (300 mg total) by mouth daily before breakfast. 90 tablet 1  . metFORMIN (GLUCOPHAGE) 1000 MG tablet Take 1 tablet (1,000 mg total) by mouth 2 (two) times daily with a meal. 180 tablet 3  . metoprolol tartrate (LOPRESSOR) 25 MG tablet Take 0.5 tablets (12.5 mg total) by mouth 2 (two) times daily. 180 tablet 0  . omeprazole (PRILOSEC) 20 MG capsule Take 1 capsule (20 mg total) by mouth every evening. 90 capsule 1  . rivaroxaban (XARELTO) 20 MG TABS tablet Take 1 tablet (20 mg total) by mouth daily with supper. 30 tablet 6  . tadalafil (CIALIS) 20 MG tablet Take 1 tablet (20 mg total) by mouth daily as needed for erectile dysfunction. 10 tablet 5  . colchicine 0.6 MG tablet Take 1 tablet (0.6 mg total) by mouth 2 (two) times daily. For 10 days 20 tablet 0   No facility-administered medications prior to visit.    ROS Review of Systems  Constitutional: Negative.  Negative for appetite change, diaphoresis and fatigue.  HENT: Negative.   Eyes: Negative for visual disturbance.  Respiratory: Negative for cough, chest tightness, shortness of breath and wheezing.   Cardiovascular: Negative for palpitations and leg swelling.  Gastrointestinal: Negative for abdominal pain, constipation, diarrhea, nausea  and vomiting.  Endocrine: Negative.   Genitourinary: Negative.   Musculoskeletal: Negative.  Negative for arthralgias, myalgias and neck pain.  Skin: Negative for color change, pallor and rash.  Neurological: Negative.  Negative for dizziness, syncope, weakness, light-headedness and numbness.  Hematological: Negative for adenopathy. Does not bruise/bleed easily.  Psychiatric/Behavioral: Positive for decreased concentration. Negative for dysphoric mood, self-injury and suicidal ideas. The  patient is not nervous/anxious.     Objective:  BP (!) 144/82 (BP Location: Left Arm, Patient Position: Sitting, Cuff Size: Normal)   Pulse 68   Temp 97.6 F (36.4 C) (Oral)   Resp 16   Ht 5\' 8"  (1.727 m)   Wt 189 lb (85.7 kg)   SpO2 96%   BMI 28.74 kg/m   BP Readings from Last 3 Encounters:  07/31/19 (!) 144/82  07/17/19 130/74  06/19/19 120/67    Wt Readings from Last 3 Encounters:  07/31/19 189 lb (85.7 kg)  07/17/19 186 lb 9.6 oz (84.6 kg)  06/19/19 190 lb (86.2 kg)    Physical Exam Vitals reviewed.  Constitutional:      Appearance: Normal appearance.  HENT:     Nose: Nose normal.     Mouth/Throat:     Mouth: Mucous membranes are moist.  Eyes:     General: No scleral icterus.    Conjunctiva/sclera: Conjunctivae normal.  Cardiovascular:     Rate and Rhythm: Normal rate and regular rhythm.     Heart sounds: No murmur heard.   Pulmonary:     Effort: Pulmonary effort is normal.     Breath sounds: No stridor. No wheezing, rhonchi or rales.  Abdominal:     General: Abdomen is flat. Bowel sounds are normal. There is no distension.     Palpations: Abdomen is soft. There is no hepatomegaly, splenomegaly or mass.     Tenderness: There is no abdominal tenderness.  Musculoskeletal:        General: Normal range of motion.     Cervical back: Neck supple.     Right lower leg: No edema.     Left lower leg: No edema.  Lymphadenopathy:     Cervical: No cervical adenopathy.  Skin:    General: Skin is warm and dry.     Coloration: Skin is not pale.  Neurological:     General: No focal deficit present.     Mental Status: He is alert.  Psychiatric:        Mood and Affect: Mood normal.        Behavior: Behavior normal.        Thought Content: Thought content normal.     Lab Results  Component Value Date   WBC 4.8 06/19/2019   HGB 16.0 06/19/2019   HCT 48.1 06/19/2019   PLT 166 06/19/2019   GLUCOSE 158 (H) 06/19/2019   CHOL 117 02/13/2019   TRIG 68.0  02/13/2019   HDL 37.80 (L) 02/13/2019   LDLCALC 65 02/13/2019   ALT 31 02/13/2019   AST 26 02/13/2019   NA 140 06/19/2019   K 4.1 06/19/2019   CL 109 06/19/2019   CREATININE 0.84 06/19/2019   BUN 23 06/19/2019   CO2 23 06/19/2019   TSH 2.50 02/13/2019   PSA 1.24 02/13/2019   INR 1.00 06/02/2017   HGBA1C 6.4 (A) 07/31/2019   MICROALBUR 1.5 02/13/2019    No results found.  Assessment & Plan:   Yuchen was seen today for hypertension, diabetes and adhd.  Diagnoses and all orders for  this visit:  Type II diabetes mellitus with manifestations ([place])- His blood sugar is adequately well controlled.  Will start an ARB for renal protection. -     canagliflozin (INVOKANA) 300 MG TABS tablet; Take 1 tablet (300 mg total) by mouth daily before breakfast. -     metFORMIN (GLUCOPHAGE) 1000 MG tablet; Take 1 tablet (1,000 mg total) by mouth 2 (two) times daily with a meal. -     Azilsartan Medoxomil (EDARBI) 40 MG TABS; Take 1 tablet by mouth daily. -     POCT glycosylated hemoglobin (Hb A1C)  Need for Tdap vaccination -     Tdap vaccine greater than or equal to 7yo IM  Benign prostatic hyperplasia with urinary hesitancy- His symptoms are well controlled.  Will continue the current dose of Uroxatrol. -     alfuzosin (UROXATRAL) 10 MG 24 hr tablet; Take 1 tablet (10 mg total) by mouth daily with breakfast.  Attention deficit hyperactivity disorder (ADHD), predominantly inattentive type- Will continue the current dose of the amphetamine. -     amphetamine-dextroamphetamine (ADDERALL) 20 MG tablet; Take 1 tablet (20 mg total) by mouth daily as needed (in the afternoon if needed for focus.). -     amphetamine-dextroamphetamine (ADDERALL XR) 30 MG 24 hr capsule; Take 1 capsule (30 mg total) by mouth every morning.  Hyperlipidemia LDL goal <100- He has achieved his LDL goal and is doing well on the statin. -     atorvastatin (LIPITOR) 10 MG tablet; Take 1 tablet (10 mg total) by mouth  daily.  Paroxysmal atrial fibrillation ([place])- He is maintaining sinus rhythm.  Will continue the current dose of metoprolol and the DOAC. -     metoprolol tartrate (LOPRESSOR) 25 MG tablet; Take 0.5 tablets (12.5 mg total) by mouth 2 (two) times daily.  Gastroesophageal reflux disease without esophagitis- His symptoms are well controlled with the PPI. -     omeprazole (PRILOSEC) 20 MG capsule; Take 1 capsule (20 mg total) by mouth every evening.  Erectile dysfunction due to arterial insufficiency -     Discontinue: tadalafil (CIALIS) 20 MG tablet; Take 1 tablet (20 mg total) by mouth daily as needed for erectile dysfunction. -     tadalafil (CIALIS) 20 MG tablet; Take 1 tablet (20 mg total) by mouth daily as needed for erectile dysfunction.  Essential hypertension- He has not achieved his blood pressure goal of 130/80.  I have asked him to add an ARB to his current regimen. -     Azilsartan Medoxomil (EDARBI) 40 MG TABS; Take 1 tablet by mouth daily.  Other orders -     rivaroxaban (XARELTO) 20 MG TABS tablet; Take 1 tablet (20 mg total) by mouth daily with supper.   I have discontinued Mikeal Hawthorne T. Burditt "Sam"'s colchicine. I am also having him start on [place]ar[place]. Additionally, I am having him maintain his BD Pen Needle Micro U/F, Toujeo SoloStar, FreeStyle Libre 14 Day Reader, [employer] 14 Day Sensor, alfuzosin, amphetamine-dextroamphetamine, atorvastatin, canagliflozin, metFORMIN, metoprolol tartrate, omeprazole, rivaroxaban, amphetamine-dextroamphetamine, and tadalafil.  Meds ordered this encounter  Medications  . alfuzosin (UROXATRAL) 10 MG 24 hr tablet    Sig: Take 1 tablet (10 mg total) by mouth daily with breakfast.    Dispense:  90 tablet    Refill:  1  . amphetamine-dextroamphetamine (ADDERALL) 20 MG tablet    Sig: Take 1 tablet (20 mg total) by mouth daily as needed (in the afternoon if needed for focus.).    Dispense:  30 tablet    Refill:  0  . atorvastatin (LIPITOR) 10  MG tablet    Sig: Take 1 tablet (10 mg total) by mouth daily.    Dispense:  90 tablet    Refill:  1  . canagliflozin (INVOKANA) 300 MG TABS tablet    Sig: Take 1 tablet (300 mg total) by mouth daily before breakfast.    Dispense:  90 tablet    Refill:  1  . metFORMIN (GLUCOPHAGE) 1000 MG tablet    Sig: Take 1 tablet (1,000 mg total) by mouth 2 (two) times daily with a meal.    Dispense:  180 tablet    Refill:  3  . metoprolol tartrate (LOPRESSOR) 25 MG tablet    Sig: Take 0.5 tablets (12.5 mg total) by mouth 2 (two) times daily.    Dispense:  180 tablet    Refill:  1  . omeprazole (PRILOSEC) 20 MG capsule    Sig: Take 1 capsule (20 mg total) by mouth every evening.    Dispense:  90 capsule    Refill:  1  . rivaroxaban (XARELTO) 20 MG TABS tablet    Sig: Take 1 tablet (20 mg total) by mouth daily with supper.    Dispense:  90 tablet    Refill:  1  . DISCONTD: tadalafil (CIALIS) 20 MG tablet    Sig: Take 1 tablet (20 mg total) by mouth daily as needed for erectile dysfunction.    Dispense:  10 tablet    Refill:  5  . Azilsartan Medoxomil (EDARBI) 40 MG TABS    Sig: Take 1 tablet by mouth daily.    Dispense:  90 tablet    Refill:  1  . amphetamine-dextroamphetamine (ADDERALL XR) 30 MG 24 hr capsule    Sig: Take 1 capsule (30 mg total) by mouth every morning.    Dispense:  30 capsule    Refill:  0  . tadalafil (CIALIS) 20 MG tablet    Sig: Take 1 tablet (20 mg total) by mouth daily as needed for erectile dysfunction.    Dispense:  10 tablet    Refill:  5   I spent 50 minutes in preparing to see the patient by review of recent labs, imaging and procedures, obtaining and reviewing separately obtained history, communicating with the patient and family or caregiver, ordering medications, tests or procedures, and documenting clinical information in the EHR including the differential Dx, treatment, and any further evaluation and other management of 1. Type II diabetes mellitus with  manifestations ([place]) 2. Benign prostatic hyperplasia with urinary hesitancy 3. Attention deficit hyperactivity disorder (ADHD), predominantly inattentive type 4. Hyperlipidemia LDL goal <100 5. Paroxysmal atrial fibrillation ([facility]) 6. Gastroesophageal reflux disease without esophagitis 7. Erectile dysfunction due to arterial insufficiency 8. Essential hypertension     Follow-up: Return in about 4 months (around 11/30/2019).  Scarlette Calico, MD

## 2019-08-16 ENCOUNTER — Other Ambulatory Visit: Payer: Self-pay | Admitting: Internal Medicine

## 2019-08-16 DIAGNOSIS — E118 Type 2 diabetes mellitus with unspecified complications: Secondary | ICD-10-CM

## 2019-08-20 ENCOUNTER — Encounter: Payer: Self-pay | Admitting: Internal Medicine

## 2019-08-20 NOTE — Addendum Note (Signed)
Addended by: Janith Lima on: 08/20/2019 02:41 PM   Modules accepted: Orders

## 2019-08-22 ENCOUNTER — Other Ambulatory Visit: Payer: Self-pay | Admitting: Internal Medicine

## 2019-08-22 DIAGNOSIS — E118 Type 2 diabetes mellitus with unspecified complications: Secondary | ICD-10-CM

## 2019-08-28 ENCOUNTER — Other Ambulatory Visit: Payer: Self-pay | Admitting: Internal Medicine

## 2019-08-28 DIAGNOSIS — F900 Attention-deficit hyperactivity disorder, predominantly inattentive type: Secondary | ICD-10-CM

## 2019-08-28 MED ORDER — AMPHETAMINE-DEXTROAMPHETAMINE 20 MG PO TABS: 20.0000 mg | ORAL_TABLET | Freq: Every day | ORAL | 0 refills | Status: DC | PRN

## 2019-08-28 MED ORDER — AMPHETAMINE-DEXTROAMPHET ER 30 MG PO CP24: 30.0000 mg | ORAL_CAPSULE | ORAL | 0 refills | Status: DC

## 2019-09-09 ENCOUNTER — Ambulatory Visit: Payer: 59

## 2019-09-19 NOTE — Progress Notes (Signed)
Electrophysiology Office Note   Date:  10/01/2019   ID:  Michael Li, DOB 1956-02-15, MRN [id number]  PCP:  Janith Lima, MD  Cardiologist:  Bettina Gavia Primary Electrophysiologist:  Desteni Piscopo Meredith Leeds, MD    Chief Complaint: palpitations   History of Present Illness: Michael Li is a 64 y.o. male who is being seen today for the evaluation of atrial fibrillation at the request of Janith Lima, MD. Presenting today for electrophysiology evaluation.  He has a history significant for type 2 diabetes, hyperlipidemia, hepatitis C, and paroxysmal atrial fibrillation.  His atrial fibrillation was diagnosed in 2018.  He was started on low-dose flecainide.  He does have significant liver disease, but he has tolerated his anticoagulation.  He does have exertional shortness of breath when he is outdoors and climbing stairs.  He is now status post AF ablation 06/19/2019.  He initially had an ablation scheduled to 1121, but was found to have a left atrial appendage thrombus and thus this was canceled.  Today, denies symptoms of palpitations, chest pain, shortness of breath, orthopnea, PND, lower extremity edema, claudication, dizziness, presyncope, syncope, bleeding, or neurologic sequela. The patient is tolerating medications without difficulties.  Since his ablation he has done well.  He has had no further episodes of atrial fibrillation.  All of his daily activities without restriction with only mild shortness of breath.  The shortness of breath has greatly improved since adding back into normal rhythm.   Past Medical History:  Diagnosis Date  . Anemia    rec'd 32 units of blood, post T&A, 1981  . ATTENTION DEFICIT HYPERACTIVITY DISORDER 07/19/2006  . BENIGN PROSTATIC HYPERTROPHY 07/19/2006  . DIABETES MELLITUS, TYPE II 07/19/2006  . Dyspnea    had shortness of breath when heart was irregular  . Dysrhythmia    history of a-fib  . ERECTILE DYSFUNCTION 07/19/2006  . GERD 07/19/2006  . HEPATITIS  C, CHRONIC 04/03/2007   Treated with Harboni   . History of blood transfusion   . NEOPLASM, MALIGNANT, CARCINOMA, BASAL CELL, NOSE 10/20/2009   Basal cell   Past Surgical History:  Procedure Laterality Date  . APPENDECTOMY    . ATRIAL FIBRILLATION ABLATION N/A 03/27/2019   Procedure: ATRIAL FIBRILLATION ABLATION;  Surgeon: Constance Haw, MD;  Location: [facility];  Service: Cardiovascular;  Laterality: N/A;  . ATRIAL FIBRILLATION ABLATION N/A 06/19/2019   Procedure: ATRIAL FIBRILLATION ABLATION;  Surgeon: Constance Haw, MD;  Location: [facility];  Service: Cardiovascular;  Laterality: N/A;  . carotid injury       post tonsillectomy, R carotid injury with trach  . HERNIA REPAIR  1973   inguinal- R side  . KNEE SURGERY Right   . LUMBAR LAMINECTOMY/DECOMPRESSION MICRODISCECTOMY Right 09/12/2012   Procedure: LUMBAR LAMINECTOMY/DECOMPRESSION MICRODISCECTOMY 1 LEVEL;  Surgeon: Charlie Pitter, MD;  Location: [facility];  Service: Neurosurgery;  Laterality: Right;  Right lumbar three-four microdiscectomy  . ORIF SHOULDER FRACTURE Right 06/02/2017   Procedure: OPEN REDUCTION INTERNAL FIXATION (ORIF) RIGHT GLENOID FRACTURE, POSSIBLE LATARJET CORACOID TRANSFER;  Surgeon: Meredith Pel, MD;  Location: [facility];  Service: Orthopedics;  Laterality: Right;  . TEE WITHOUT CARDIOVERSION N/A 06/19/2019   Procedure: TRANSESOPHAGEAL ECHOCARDIOGRAM (TEE);  Surgeon: Thayer Headings, MD;  Location: [facility];  Service: Cardiovascular;  Laterality: N/A;  . TONSILLECTOMY    . TRACHEOSTOMY CLOSURE  1981     Current Outpatient Medications  Medication Sig Dispense Refill  .  alfuzosin (UROXATRAL) 10 MG 24 hr tablet Take 1 tablet (10 mg total) by mouth daily with breakfast. 90 tablet 1  . amphetamine-dextroamphetamine (ADDERALL XR) 30 MG 24 hr capsule Take 1 capsule (30 mg total) by mouth every morning. 30 capsule 0  . amphetamine-dextroamphetamine (ADDERALL) 20 MG tablet Take 1  tablet (20 mg total) by mouth daily as needed (in the afternoon if needed for focus.). 30 tablet 0  . atorvastatin (LIPITOR) 10 MG tablet Take 1 tablet (10 mg total) by mouth daily. 90 tablet 1  . BD PEN NEEDLE MICRO U/F 32G X 6 MM MISC USE 2 PEN NEEDLES DAILY 200 each 3  . Continuous Blood Gluc Receiver (FREESTYLE LIBRE 14 DAY READER) DEVI 1 Act by Does not apply route daily. 1 each 4  . Continuous Blood Gluc Sensor (FREESTYLE LIBRE 14 DAY SENSOR) MISC USE AS DIRECTED DAILY; CHANGE SENSOR EVERY 14 DAYS 1 each 5  . insulin glargine, 1 Unit Dial, (TOUJEO SOLOSTAR) 300 UNIT/ML Solostar Pen Inject 20 Units into the skin every morning. 4.5 mL 1  . INVOKANA 300 MG TABS tablet TAKE 1 TABLET(300 MG) BY MOUTH DAILY BEFORE BREAKFAST 90 tablet 1  . metFORMIN (GLUCOPHAGE) 1000 MG tablet Take 1 tablet (1,000 mg total) by mouth 2 (two) times daily with a meal. 180 tablet 3  . metoprolol tartrate (LOPRESSOR) 25 MG tablet Take 0.5 tablets (12.5 mg total) by mouth 2 (two) times daily. 180 tablet 1  . omeprazole (PRILOSEC) 20 MG capsule Take 1 capsule (20 mg total) by mouth every evening. 90 capsule 1  . rivaroxaban (XARELTO) 20 MG TABS tablet Take 1 tablet (20 mg total) by mouth daily with supper. 90 tablet 1  . tadalafil (CIALIS) 20 MG tablet Take 1 tablet (20 mg total) by mouth daily as needed for erectile dysfunction. 10 tablet 5   No current facility-administered medications for this visit.    Allergies:   Cephalexin and Edarbi [azilsartan]   Social History:  The patient  reports that he quit smoking about 14 years ago. His smoking use included cigarettes. He quit after 34.00 years of use. He quit smokeless tobacco use about 12 years ago.  His smokeless tobacco use included chew. He reports current alcohol use. He reports that he does not use drugs.   Family History:  The patient's family history includes COPD in his mother; Cancer in his paternal grandmother; Diabetes in his maternal aunt and maternal aunt;  Heart attack in his father.    ROS:  Please see the history of present illness.   Otherwise, review of systems is positive for none.   All other systems are reviewed and negative.   PHYSICAL EXAM: VS:  BP 126/74   Pulse 62   Ht 5\' 9"  (1.753 m)   Wt 187 lb 3.2 oz (84.9 kg)   SpO2 96%   BMI 27.64 kg/m  , BMI Body mass index is 27.64 kg/m. GEN: Well nourished, well developed, in no acute distress  HEENT: normal  Neck: no JVD, carotid bruits, or masses Cardiac: RRR; no murmurs, rubs, or gallops,no edema  Respiratory:  clear to auscultation bilaterally, normal work of breathing GI: soft, nontender, nondistended, + BS MS: no deformity or atrophy  Skin: warm and dry Neuro:  Strength and sensation are intact Psych: euthymic mood, full affect  EKG:  EKG is ordered today. Personal review of the ekg ordered shows sinus rhythm, rate 62   Recent Labs: 02/13/2019: ALT 31; TSH 2.50 06/19/2019: BUN 23;  Creatinine, Ser 0.84; Hemoglobin 16.0; Platelets 166; Potassium 4.1; Sodium 140    Lipid Panel     Component Value Date/Time   CHOL 117 02/13/2019 1539   TRIG 68.0 02/13/2019 1539   HDL 37.80 (L) 02/13/2019 1539   CHOLHDL 3 02/13/2019 1539   VLDL 13.6 02/13/2019 1539   LDLCALC 65 02/13/2019 1539     Wt Readings from Last 3 Encounters:  10/01/19 187 lb 3.2 oz (84.9 kg)  07/31/19 189 lb (85.7 kg)  07/17/19 186 lb 9.6 oz (84.6 kg)      Other studies Reviewed: Additional studies/ records that were reviewed today include: TTE 11/2016  Review of the above records today demonstrates:  - Left ventricle: The cavity size was normal. There was mild   concentric hypertrophy. Systolic function was normal. The   estimated ejection fraction was in the range of 55% to 60%. Wall   motion was normal; there were no regional wall motion   abnormalities. Features are consistent with a pseudonormal left   ventricular filling pattern, with concomitant abnormal relaxation   and increased filling  pressure (grade 2 diastolic dysfunction).   Doppler parameters are consistent with high ventricular filling   pressure. - Aortic valve: Transvalvular velocity was within the normal range.   There was no stenosis. There was no regurgitation. - Mitral valve: Transvalvular velocity was within the normal range.   There was no evidence for stenosis. There was no regurgitation. - Left atrium: The atrium was mildly dilated. - Right ventricle: The cavity size was normal. Wall thickness was   normal. Systolic function was normal. - Atrial septum: No defect or patent foramen ovale was identified   by color flow Doppler. - Tricuspid valve: There was mild regurgitation. - Pulmonary arteries: Systolic pressure was within the normal   range. PA peak pressure: 35 mm Hg (S).  Zio 10/01/18 personally reviewed Sinus rhythm was present 49% of the time with minimum average and maximum heart rates of 53, 78 and 114 bpm. Atrial fibrillation was present 51% of the time with minimum average and maximum heart rates of 48, 86 174 bpm. There were no pauses of 3 seconds or greater and no episodes of sinus node or AV block. There are 5 triggered events  4 atrial fibrillation and 1 with ventricular premature contractions.   ASSESSMENT AND PLAN:  1.  Persistent atrial fibrillation: Currently on flecainide and Eliquis.  CHA2DS2-VASc of 2.  Status post ablation 06/19/2019.  He has not had any further episodes of atrial fibrillation.  He is daily activities.  We Thuan Tippett continue with current medications.  2.  Hyperlipidemia: Continue atorvastatin per primary care.    Current medicines are reviewed at length with the patient today.   The patient does not have concerns regarding his medicines.  The following changes were made today: None  Labs/ tests ordered today include:  Orders Placed This Encounter  Procedures  . EKG 12-Lead    Disposition:   FU with Chealsea Paske. months  Signed, Terrika Zuver Meredith Leeds, MD    10/01/2019 8:19 AM     [facility] [address] [place] [address] [phone] (office) [phone] (fax)

## 2019-09-25 ENCOUNTER — Other Ambulatory Visit: Payer: Self-pay | Admitting: Internal Medicine

## 2019-09-25 DIAGNOSIS — F900 Attention-deficit hyperactivity disorder, predominantly inattentive type: Secondary | ICD-10-CM

## 2019-09-26 MED ORDER — AMPHETAMINE-DEXTROAMPHET ER 30 MG PO CP24: 30.0000 mg | ORAL_CAPSULE | ORAL | 0 refills | Status: DC

## 2019-09-26 MED ORDER — AMPHETAMINE-DEXTROAMPHETAMINE 20 MG PO TABS: 20.0000 mg | ORAL_TABLET | Freq: Every day | ORAL | 0 refills | Status: DC | PRN

## 2019-10-01 ENCOUNTER — Encounter: Payer: Self-pay | Admitting: Cardiology

## 2019-10-01 ENCOUNTER — Ambulatory Visit (INDEPENDENT_AMBULATORY_CARE_PROVIDER_SITE_OTHER): Payer: 59 | Admitting: Cardiology

## 2019-10-01 ENCOUNTER — Other Ambulatory Visit: Payer: Self-pay

## 2019-10-01 VITALS — BP 126/74 | HR 62 | Ht 69.0 in | Wt 187.2 lb

## 2019-10-01 DIAGNOSIS — I480 Paroxysmal atrial fibrillation: Secondary | ICD-10-CM

## 2019-10-01 NOTE — Patient Instructions (Signed)
Medication Instructions:  Your physician recommends that you continue on your current medications as directed. Please refer to the Current Medication list given to you today.  *If you need a refill on your cardiac medications before your next appointment, please call your pharmacy*   Lab Work: None ordered If you have labs (blood work) drawn today and your tests are completely normal, you will receive your results only by: . MyChart Message (if you have MyChart) OR . A paper copy in the mail If you have any lab test that is abnormal or we need to change your treatment, we will call you to review the results.   Testing/Procedures: None ordered   Follow-Up: At [facility], you and your health needs are our priority.  As part of our continuing mission to provide you with exceptional heart care, we have created designated Provider Care Teams.  These Care Teams include your primary Cardiologist (physician) and Advanced Practice Providers (APPs -  Physician Assistants and Nurse Practitioners) who all work together to provide you with the care you need, when you need it.  We recommend signing up for the patient portal called "MyChart".  Sign up information is provided on this After Visit Summary.  MyChart is used to connect with patients for Virtual Visits (Telemedicine).  Patients are able to view lab/test results, encounter notes, upcoming appointments, etc.  Non-urgent messages can be sent to your provider as well.   To learn more about what you can do with MyChart, go to [email or web address].    Your next appointment:   3 month(s)  The format for your next appointment:   In Person  Provider:   Will Camnitz, MD   Thank you for choosing [facility]!!   Glenna Brunkow, RN [phone]    Other Instructions    

## 2019-10-10 ENCOUNTER — Other Ambulatory Visit: Payer: Self-pay

## 2019-10-10 ENCOUNTER — Ambulatory Visit (INDEPENDENT_AMBULATORY_CARE_PROVIDER_SITE_OTHER): Payer: 59 | Admitting: *Deleted

## 2019-10-10 DIAGNOSIS — Z23 Encounter for immunization: Secondary | ICD-10-CM

## 2019-10-29 ENCOUNTER — Other Ambulatory Visit: Payer: Self-pay | Admitting: Internal Medicine

## 2019-10-29 DIAGNOSIS — F900 Attention-deficit hyperactivity disorder, predominantly inattentive type: Secondary | ICD-10-CM

## 2019-10-30 MED ORDER — AMPHETAMINE-DEXTROAMPHET ER 30 MG PO CP24: 30.0000 mg | ORAL_CAPSULE | ORAL | 0 refills | Status: DC

## 2019-10-30 MED ORDER — AMPHETAMINE-DEXTROAMPHETAMINE 20 MG PO TABS: 20.0000 mg | ORAL_TABLET | Freq: Every day | ORAL | 0 refills | Status: DC | PRN

## 2019-10-31 ENCOUNTER — Other Ambulatory Visit: Payer: Self-pay | Admitting: Internal Medicine

## 2019-10-31 DIAGNOSIS — E118 Type 2 diabetes mellitus with unspecified complications: Secondary | ICD-10-CM

## 2019-11-29 ENCOUNTER — Other Ambulatory Visit: Payer: Self-pay | Admitting: Internal Medicine

## 2019-11-29 DIAGNOSIS — F900 Attention-deficit hyperactivity disorder, predominantly inattentive type: Secondary | ICD-10-CM

## 2019-11-30 MED ORDER — AMPHETAMINE-DEXTROAMPHETAMINE 20 MG PO TABS: 20.0000 mg | ORAL_TABLET | Freq: Every day | ORAL | 0 refills | Status: DC | PRN

## 2019-11-30 MED ORDER — AMPHETAMINE-DEXTROAMPHET ER 30 MG PO CP24: 30.0000 mg | ORAL_CAPSULE | ORAL | 0 refills | Status: DC

## 2020-01-01 ENCOUNTER — Other Ambulatory Visit: Payer: Self-pay | Admitting: Internal Medicine

## 2020-01-01 DIAGNOSIS — F900 Attention-deficit hyperactivity disorder, predominantly inattentive type: Secondary | ICD-10-CM

## 2020-01-02 MED ORDER — AMPHETAMINE-DEXTROAMPHETAMINE 20 MG PO TABS: 20.0000 mg | ORAL_TABLET | Freq: Every day | ORAL | 0 refills | Status: DC | PRN

## 2020-01-02 MED ORDER — AMPHETAMINE-DEXTROAMPHET ER 30 MG PO CP24: 30.0000 mg | ORAL_CAPSULE | ORAL | 0 refills | Status: DC

## 2020-01-11 NOTE — Progress Notes (Signed)
Electrophysiology Office Note   Date:  01/13/2020   ID:  [name], [name] 1955/11/19, MRN [id number]  PCP:  Michael Lima, MD  Cardiologist:  Michael Li Primary Electrophysiologist:  Michael Jefferys Meredith Leeds, MD    Chief Complaint: palpitations   History of Present Illness: Michael Li is a 64 y.o. male who is being seen today for the evaluation of atrial fibrillation at the request of Michael Lima, MD. Presenting today for electrophysiology evaluation.  He has a history of type 2 diabetes, hyperlipidemia, hepatitis C, and paroxysmal atrial fibrillation.  His atrial fibrillation was diagnosed in 2018 and he was started on low-dose flecainide.  He is now status post AF ablation 06/19/2019.    Today, denies symptoms of palpitations, chest pain, shortness of breath, orthopnea, PND, lower extremity edema, claudication, dizziness, presyncope, syncope, bleeding, or neurologic sequela. The patient is tolerating medications without difficulties.  Since last being seen he has done well.  He has had no further episodes of atrial fibrillation.  He has quite a bit more energy and less fatigue and shortness of breath.  He is able do all of his daily activities without restriction.   Past Medical History:  Diagnosis Date  . Anemia    rec'd 32 units of blood, post T&A, 1981  . ATTENTION DEFICIT HYPERACTIVITY DISORDER 07/19/2006  . BENIGN PROSTATIC HYPERTROPHY 07/19/2006  . DIABETES MELLITUS, TYPE II 07/19/2006  . Dyspnea    had shortness of breath when heart was irregular  . Dysrhythmia    history of a-fib  . ERECTILE DYSFUNCTION 07/19/2006  . GERD 07/19/2006  . HEPATITIS C, CHRONIC 04/03/2007   Treated with Harboni   . History of blood transfusion   . NEOPLASM, MALIGNANT, CARCINOMA, BASAL CELL, NOSE 10/20/2009   Basal cell   Past Surgical History:  Procedure Laterality Date  . APPENDECTOMY    . ATRIAL FIBRILLATION ABLATION N/A 03/27/2019   Procedure: ATRIAL FIBRILLATION ABLATION;  Surgeon:  Constance Haw, MD;  Location: [facility];  Service: Cardiovascular;  Laterality: N/A;  . ATRIAL FIBRILLATION ABLATION N/A 06/19/2019   Procedure: ATRIAL FIBRILLATION ABLATION;  Surgeon: Constance Haw, MD;  Location: [facility];  Service: Cardiovascular;  Laterality: N/A;  . carotid injury       post tonsillectomy, R carotid injury with trach  . HERNIA REPAIR  1973   inguinal- R side  . KNEE SURGERY Right   . LUMBAR LAMINECTOMY/DECOMPRESSION MICRODISCECTOMY Right 09/12/2012   Procedure: LUMBAR LAMINECTOMY/DECOMPRESSION MICRODISCECTOMY 1 LEVEL;  Surgeon: Charlie Pitter, MD;  Location: [facility];  Service: Neurosurgery;  Laterality: Right;  Right lumbar three-four microdiscectomy  . ORIF SHOULDER FRACTURE Right 06/02/2017   Procedure: OPEN REDUCTION INTERNAL FIXATION (ORIF) RIGHT GLENOID FRACTURE, POSSIBLE LATARJET CORACOID TRANSFER;  Surgeon: Meredith Pel, MD;  Location: [facility];  Service: Orthopedics;  Laterality: Right;  . TEE WITHOUT CARDIOVERSION N/A 06/19/2019   Procedure: TRANSESOPHAGEAL ECHOCARDIOGRAM (TEE);  Surgeon: Thayer Headings, MD;  Location: [facility];  Service: Cardiovascular;  Laterality: N/A;  . TONSILLECTOMY    . TRACHEOSTOMY CLOSURE  1981     Current Outpatient Medications  Medication Sig Dispense Refill  . alfuzosin (UROXATRAL) 10 MG 24 hr tablet Take 1 tablet (10 mg total) by mouth daily with breakfast. 90 tablet 1  . amphetamine-dextroamphetamine (ADDERALL XR) 30 MG 24 hr capsule Take 1 capsule (30 mg total) by mouth every morning. 30 capsule 0  . amphetamine-dextroamphetamine (ADDERALL) 20 MG tablet  Take 1 tablet (20 mg total) by mouth daily as needed (in the afternoon if needed for focus.). 30 tablet 0  . atorvastatin (LIPITOR) 10 MG tablet Take 1 tablet (10 mg total) by mouth daily. 90 tablet 1  . BD PEN NEEDLE MICRO U/F 32G X 6 MM MISC USE 2 PEN NEEDLES DAILY 200 each 3  . Continuous Blood Gluc Receiver (FREESTYLE LIBRE 14 DAY  READER) DEVI 1 Act by Does not apply route daily. 1 each 4  . Continuous Blood Gluc Sensor (FREESTYLE LIBRE 14 DAY SENSOR) MISC USE AS DIRECTED CHANGE SENSOR EVERY 14 DAYS 1 each 5  . insulin glargine, 1 Unit Dial, (TOUJEO SOLOSTAR) 300 UNIT/ML Solostar Pen Inject 20 Units into the skin every morning. 4.5 mL 1  . INVOKANA 300 MG TABS tablet TAKE 1 TABLET(300 MG) BY MOUTH DAILY BEFORE BREAKFAST 90 tablet 1  . metFORMIN (GLUCOPHAGE) 1000 MG tablet Take 1 tablet (1,000 mg total) by mouth 2 (two) times daily with a meal. 180 tablet 3  . metoprolol tartrate (LOPRESSOR) 25 MG tablet Take 0.5 tablets (12.5 mg total) by mouth 2 (two) times daily. 180 tablet 1  . omeprazole (PRILOSEC) 20 MG capsule Take 1 capsule (20 mg total) by mouth every evening. 90 capsule 1  . rivaroxaban (XARELTO) 20 MG TABS tablet Take 1 tablet (20 mg total) by mouth daily with supper. 90 tablet 1  . tadalafil (CIALIS) 20 MG tablet Take 1 tablet (20 mg total) by mouth daily as needed for erectile dysfunction. 10 tablet 5   No current facility-administered medications for this visit.    Allergies:   Cephalexin and Edarbi [azilsartan]   Social History:  The patient  reports that he quit smoking about 14 years ago. His smoking use included cigarettes. He quit after 34.00 years of use. He quit smokeless tobacco use about 12 years ago.  His smokeless tobacco use included chew. He reports current alcohol use. He reports that he does not use drugs.   Family History:  The patient's family history includes COPD in his mother; Cancer in his paternal grandmother; Diabetes in his maternal aunt and maternal aunt; Heart attack in his father.    ROS:  Please see the history of present illness.   Otherwise, review of systems is positive for none.   All other systems are reviewed and negative.   PHYSICAL EXAM: VS:  BP 138/80   Pulse 60   Ht 5\' 8"  (1.727 m)   Wt 188 lb 3.2 oz (85.4 kg)   BMI 28.62 kg/m  , BMI Body mass index is 28.62  kg/m. GEN: Well nourished, well developed, in no acute distress  HEENT: normal  Neck: no JVD, carotid bruits, or masses Cardiac: RRR; no murmurs, rubs, or gallops,no edema  Respiratory:  clear to auscultation bilaterally, normal work of breathing GI: soft, nontender, nondistended, + BS MS: no deformity or atrophy  Skin: warm and dry Neuro:  Strength and sensation are intact Psych: euthymic mood, full affect  EKG:  EKG is ordered today. Personal review of the ekg ordered shows sinus rhythm, rate 60   Recent Labs: 02/13/2019: ALT 31; TSH 2.50 06/19/2019: BUN 23; Creatinine, Ser 0.84; Hemoglobin 16.0; Platelets 166; Potassium 4.1; Sodium 140    Lipid Panel     Component Value Date/Time   CHOL 117 02/13/2019 1539   TRIG 68.0 02/13/2019 1539   HDL 37.80 (L) 02/13/2019 1539   CHOLHDL 3 02/13/2019 1539   VLDL 13.6 02/13/2019 1539  [place] 65 02/13/2019 1539     Wt Readings from Last 3 Encounters:  01/13/20 188 lb 3.2 oz (85.4 kg)  10/01/19 187 lb 3.2 oz (84.9 kg)  07/31/19 189 lb (85.7 kg)      Other studies Reviewed: Additional studies/ records that were reviewed today include: TTE 11/2016  Review of the above records today demonstrates:  - Left ventricle: The cavity size was normal. There was mild   concentric hypertrophy. Systolic function was normal. The   estimated ejection fraction was in the range of 55% to 60%. Wall   motion was normal; there were no regional wall motion   abnormalities. Features are consistent with a pseudonormal left   ventricular filling pattern, with concomitant abnormal relaxation   and increased filling pressure (grade 2 diastolic dysfunction).   Doppler parameters are consistent with high ventricular filling   pressure. - Aortic valve: Transvalvular velocity was within the normal range.   There was no stenosis. There was no regurgitation. - Mitral valve: Transvalvular velocity was within the normal range.   There was no evidence for  stenosis. There was no regurgitation. - Left atrium: The atrium was mildly dilated. - Right ventricle: The cavity size was normal. Wall thickness was   normal. Systolic function was normal. - Atrial septum: No defect or patent foramen ovale was identified   by color flow Doppler. - Tricuspid valve: There was mild regurgitation. - Pulmonary arteries: Systolic pressure was within the normal   range. PA peak pressure: 35 mm Hg (S).  Zio 10/01/18 personally reviewed Sinus rhythm was present 49% of the time with minimum average and maximum heart rates of 53, 78 and 114 bpm. Atrial fibrillation was present 51% of the time with minimum average and maximum heart rates of 48, 86 174 bpm. There were no pauses of 3 seconds or greater and no episodes of sinus node or AV block. There are 5 triggered events  4 atrial fibrillation and 1 with ventricular premature contractions.   ASSESSMENT AND PLAN:  1.  Persistent atrial fibrillation: Currently on flecainide and Eliquis.  CHA2DS2-VASc of 2.  Status post ablation 06/19/2019.  He currently feels well.  He has had no further episodes of atrial fibrillation.  We Faheem Ziemann continue with current medications.  2.  Hyperlipidemia: Continue atorvastatin.    Current medicines are reviewed at length with the patient today.   The patient does not have concerns regarding his medicines.  The following changes were made today: None  Labs/ tests ordered today include:  No orders of the defined types were placed in this encounter.   Disposition:   FU with Winslow Verrill 6 months  Signed, Divante Kotch Meredith Leeds, MD  01/13/2020 8:24 AM     [facility] [address] [place] [address] [phone] (office) [phone] (fax)

## 2020-01-13 ENCOUNTER — Encounter: Payer: Self-pay | Admitting: Cardiology

## 2020-01-13 ENCOUNTER — Ambulatory Visit (INDEPENDENT_AMBULATORY_CARE_PROVIDER_SITE_OTHER): Payer: 59 | Admitting: Cardiology

## 2020-01-13 ENCOUNTER — Other Ambulatory Visit: Payer: Self-pay

## 2020-01-13 VITALS — BP 138/80 | HR 60 | Ht 68.0 in | Wt 188.2 lb

## 2020-01-13 DIAGNOSIS — I4819 Other persistent atrial fibrillation: Secondary | ICD-10-CM

## 2020-01-13 NOTE — Patient Instructions (Addendum)
Medication Instructions:  Your physician recommends that you continue on your current medications as directed. Please refer to the Current Medication list given to you today.  *If you need a refill on your cardiac medications before your next appointment, please call your pharmacy*   Lab Work: None ordered   Testing/Procedures: None ordered   Follow-Up: At [facility], you and your health needs are our priority.  As part of our continuing mission to provide you with exceptional heart care, we have created designated Provider Care Teams.  These Care Teams include your primary Cardiologist (physician) and Advanced Practice Providers (APPs -  Physician Assistants and Nurse Practitioners) who all work together to provide you with the care you need, when you need it.  Your next appointment:   6 month(s)  The format for your next appointment:   In Person  Provider:   Allegra Lai, MD    Thank you for choosing [facility]!!   Trinidad Curet, RN [phone]   Recheck

## 2020-01-13 NOTE — Addendum Note (Signed)
Addended by: Stanton Kidney on: 01/13/2020 03:47 PM   Modules accepted: Orders

## 2020-01-27 ENCOUNTER — Other Ambulatory Visit: Payer: Self-pay | Admitting: Internal Medicine

## 2020-01-27 DIAGNOSIS — K219 Gastro-esophageal reflux disease without esophagitis: Secondary | ICD-10-CM

## 2020-01-27 DIAGNOSIS — F900 Attention-deficit hyperactivity disorder, predominantly inattentive type: Secondary | ICD-10-CM

## 2020-01-27 DIAGNOSIS — E785 Hyperlipidemia, unspecified: Secondary | ICD-10-CM

## 2020-01-29 ENCOUNTER — Other Ambulatory Visit: Payer: Self-pay | Admitting: Internal Medicine

## 2020-01-29 DIAGNOSIS — E118 Type 2 diabetes mellitus with unspecified complications: Secondary | ICD-10-CM

## 2020-01-29 DIAGNOSIS — F900 Attention-deficit hyperactivity disorder, predominantly inattentive type: Secondary | ICD-10-CM

## 2020-01-30 ENCOUNTER — Other Ambulatory Visit: Payer: Self-pay | Admitting: Internal Medicine

## 2020-01-30 DIAGNOSIS — F900 Attention-deficit hyperactivity disorder, predominantly inattentive type: Secondary | ICD-10-CM

## 2020-01-30 MED ORDER — AMPHETAMINE-DEXTROAMPHETAMINE 20 MG PO TABS: 20.0000 mg | ORAL_TABLET | Freq: Every day | ORAL | 0 refills | Status: DC | PRN

## 2020-01-30 MED ORDER — AMPHETAMINE-DEXTROAMPHET ER 30 MG PO CP24: 30.0000 mg | ORAL_CAPSULE | ORAL | 0 refills | Status: DC

## 2020-02-06 ENCOUNTER — Ambulatory Visit (INDEPENDENT_AMBULATORY_CARE_PROVIDER_SITE_OTHER): Payer: 59 | Admitting: Internal Medicine

## 2020-02-06 ENCOUNTER — Encounter: Payer: Self-pay | Admitting: Internal Medicine

## 2020-02-06 ENCOUNTER — Other Ambulatory Visit: Payer: Self-pay

## 2020-02-06 ENCOUNTER — Ambulatory Visit: Payer: 59 | Admitting: Internal Medicine

## 2020-02-06 VITALS — BP 138/84 | HR 61 | Temp 98.3°F | Resp 16 | Ht 68.0 in | Wt 189.0 lb

## 2020-02-06 DIAGNOSIS — E089 Diabetes mellitus due to underlying condition without complications: Secondary | ICD-10-CM | POA: Diagnosis not present

## 2020-02-06 DIAGNOSIS — E118 Type 2 diabetes mellitus with unspecified complications: Secondary | ICD-10-CM | POA: Diagnosis not present

## 2020-02-06 DIAGNOSIS — F900 Attention-deficit hyperactivity disorder, predominantly inattentive type: Secondary | ICD-10-CM

## 2020-02-06 DIAGNOSIS — N401 Enlarged prostate with lower urinary tract symptoms: Secondary | ICD-10-CM | POA: Diagnosis not present

## 2020-02-06 DIAGNOSIS — B351 Tinea unguium: Secondary | ICD-10-CM

## 2020-02-06 DIAGNOSIS — I480 Paroxysmal atrial fibrillation: Secondary | ICD-10-CM

## 2020-02-06 DIAGNOSIS — I10 Essential (primary) hypertension: Secondary | ICD-10-CM

## 2020-02-06 DIAGNOSIS — R3911 Hesitancy of micturition: Secondary | ICD-10-CM

## 2020-02-06 DIAGNOSIS — M79675 Pain in left toe(s): Secondary | ICD-10-CM

## 2020-02-06 DIAGNOSIS — M79674 Pain in right toe(s): Secondary | ICD-10-CM

## 2020-02-06 DIAGNOSIS — E785 Hyperlipidemia, unspecified: Secondary | ICD-10-CM

## 2020-02-06 LAB — URINALYSIS, ROUTINE W REFLEX MICROSCOPIC
Bilirubin Urine: NEGATIVE
Hgb urine dipstick: NEGATIVE
Ketones, ur: NEGATIVE
Leukocytes,Ua: NEGATIVE
Nitrite: NEGATIVE
RBC / HPF: NONE SEEN (ref 0–?)
Specific Gravity, Urine: 1.025 (ref 1.000–1.030)
Total Protein, Urine: NEGATIVE
Urine Glucose: >=1000 — AB
Urobilinogen, UA: 0.2 (ref 0.0–1.0)
WBC, UA: NONE SEEN (ref 0–?)
pH: 5.5 (ref 5.0–8.0)

## 2020-02-06 LAB — HEPATIC FUNCTION PANEL
ALT: 20 U/L (ref 0–53)
AST: 18 U/L (ref 0–37)
Albumin: 4.2 g/dL (ref 3.5–5.2)
Alkaline Phosphatase: 58 U/L (ref 39–117)
Bilirubin, Direct: 0.2 mg/dL (ref 0.0–0.3)
Total Bilirubin: 0.8 mg/dL (ref 0.2–1.2)
Total Protein: 6.4 g/dL (ref 6.0–8.3)

## 2020-02-06 LAB — HEMOGLOBIN A1C: Hgb A1c MFr Bld: 7.1 % — ABNORMAL HIGH (ref 4.6–6.5)

## 2020-02-06 LAB — BASIC METABOLIC PANEL
BUN: 18 mg/dL (ref 6–23)
CO2: 28 mEq/L (ref 19–32)
Calcium: 9.2 mg/dL (ref 8.4–10.5)
Chloride: 105 mEq/L (ref 96–112)
Creatinine, Ser: 0.85 mg/dL (ref 0.40–1.50)
GFR: 91.63 mL/min (ref >60.00)
Glucose, Bld: 151 mg/dL — ABNORMAL HIGH (ref 70–99)
Potassium: 4.1 mEq/L (ref 3.5–5.1)
Sodium: 139 mEq/L (ref 135–145)

## 2020-02-06 LAB — MICROALBUMIN / CREATININE URINE RATIO
Creatinine,U: 62.8 mg/dL
Microalb Creat Ratio: 2.1 mg/g (ref 0.0–30.0)
Microalb, Ur: 1.3 mg/dL (ref 0.0–1.9)

## 2020-02-06 LAB — PSA: PSA: 0.98 ng/mL (ref 0.10–4.00)

## 2020-02-06 LAB — LIPID PANEL
Cholesterol: 105 mg/dL (ref 0–200)
HDL: 36.5 mg/dL — ABNORMAL LOW (ref >39.00)
LDL Cholesterol: 55 mg/dL (ref 0–99)
NonHDL: 68.44
Total CHOL/HDL Ratio: 3
Triglycerides: 65 mg/dL (ref 0.0–149.0)
VLDL: 13 mg/dL (ref 0.0–40.0)

## 2020-02-06 LAB — TSH: TSH: 2.4 u[IU]/mL (ref 0.35–4.50)

## 2020-02-06 MED ORDER — FLUCONAZOLE 150 MG PO TABS: 300.0000 mg | ORAL_TABLET | ORAL | 0 refills | Status: AC

## 2020-02-06 NOTE — Progress Notes (Signed)
Subjective:  Patient ID: Michael Li, male    DOB: 1955/05/20  Age: 64 y.o. MRN: [id number]  CC: Hypertension, Hyperlipidemia, and Diabetes  This visit occurred during the SARS-CoV-2 public health emergency.  Safety protocols were in place, including screening questions prior to the visit, additional usage of staff PPE, and extensive cleaning of exam room while observing appropriate contact time as indicated for disinfecting solutions.    HPI Michael Li presents for f/up -  He tells me that his blood sugars have been well controlled.  He is compliant with his glycemic regimen.  He denies polys, dizziness, or lightheadedness.  He tells me he is remained in sinus rhythm.  He denies any recent episodes of palpitations.  He is active and denies dizziness, lightheadedness, near syncope, chest pain, shortness of breath, palpitations, edema, or fatigue.  He is concerned about the appearance and discomfort in his great toenails.  Pt states ADD status overall stable on current meds with overall good compliance and tolerability, and good effectiveness with respect to ability for concentration and task completion.  He would like to continue the current dose of Adderall.  Outpatient Medications Prior to Visit  Medication Sig Dispense Refill  . alfuzosin (UROXATRAL) 10 MG 24 hr tablet Take 1 tablet (10 mg total) by mouth daily with breakfast. 90 tablet 1  . amphetamine-dextroamphetamine (ADDERALL XR) 30 MG 24 hr capsule Take 1 capsule (30 mg total) by mouth every morning. 30 capsule 0  . amphetamine-dextroamphetamine (ADDERALL) 20 MG tablet Take 1 tablet (20 mg total) by mouth daily as needed (in the afternoon if needed for focus.). 30 tablet 0  . atorvastatin (LIPITOR) 10 MG tablet Take 1 tablet (10 mg total) by mouth daily. 90 tablet 1  . BD PEN NEEDLE MICRO U/F 32G X 6 MM MISC USE 2 PEN NEEDLES DAILY 200 each 3  . Continuous Blood Gluc Receiver (FREESTYLE LIBRE 14 DAY READER) DEVI  1 Act by Does not apply route daily. 1 each 4  . Continuous Blood Gluc Sensor (FREESTYLE LIBRE 14 DAY SENSOR) MISC USE AS DIRECTED CHANGE SENSOR EVERY 14 DAYS 1 each 5  . insulin glargine, 1 Unit Dial, (TOUJEO SOLOSTAR) 300 UNIT/ML Solostar Pen Inject 20 Units into the skin every morning. 4.5 mL 1  . INVOKANA 300 MG TABS tablet TAKE 1 TABLET(300 MG) BY MOUTH DAILY BEFORE BREAKFAST 90 tablet 1  . metFORMIN (GLUCOPHAGE) 1000 MG tablet Take 1 tablet (1,000 mg total) by mouth 2 (two) times daily with a meal. 180 tablet 3  . metoprolol tartrate (LOPRESSOR) 25 MG tablet Take 0.5 tablets (12.5 mg total) by mouth 2 (two) times daily. 180 tablet 1  . omeprazole (PRILOSEC) 20 MG capsule Take 1 capsule (20 mg total) by mouth every evening. 90 capsule 1  . rivaroxaban (XARELTO) 20 MG TABS tablet Take 1 tablet (20 mg total) by mouth daily with supper. 90 tablet 1  . tadalafil (CIALIS) 20 MG tablet Take 1 tablet (20 mg total) by mouth daily as needed for erectile dysfunction. 10 tablet 5   No facility-administered medications prior to visit.    ROS Review of Systems  Constitutional: Negative for appetite change, chills, diaphoresis, fatigue and fever.  HENT: Negative.   Eyes: Negative for visual disturbance.  Respiratory: Negative for cough, chest tightness, shortness of breath and wheezing.   Cardiovascular: Negative for chest pain, palpitations and leg swelling.  Gastrointestinal: Negative for abdominal pain, constipation, diarrhea, nausea and vomiting.  Endocrine: Negative.  Negative for polydipsia, polyphagia and polyuria.  Genitourinary: Negative.  Negative for difficulty urinating, frequency, hematuria, scrotal swelling and testicular pain.  Musculoskeletal: Negative for arthralgias.  Skin: Negative.  Negative for color change, pallor and rash.  Neurological: Negative.  Negative for dizziness, light-headedness, numbness and headaches.  Hematological: Negative for adenopathy. Does not bruise/bleed  easily.  Psychiatric/Behavioral: Positive for dysphoric mood. Negative for behavioral problems, confusion, self-injury and suicidal ideas. The patient is not nervous/anxious.     Objective:  BP 138/84   Pulse 61   Temp 98.3 F (36.8 C) (Oral)   Resp 16   Ht 5\' 8"  (1.727 m)   Wt 189 lb (85.7 kg)   SpO2 96%   BMI 28.74 kg/m   BP Readings from Last 3 Encounters:  02/06/20 138/84  01/13/20 138/80  10/01/19 126/74    Wt Readings from Last 3 Encounters:  02/06/20 189 lb (85.7 kg)  01/13/20 188 lb 3.2 oz (85.4 kg)  10/01/19 187 lb 3.2 oz (84.9 kg)    Physical Exam Vitals reviewed.  HENT:     Nose: Nose normal.     Mouth/Throat:     Mouth: Mucous membranes are moist.  Eyes:     General: No scleral icterus.    Conjunctiva/sclera: Conjunctivae normal.  Cardiovascular:     Rate and Rhythm: Normal rate and regular rhythm.     Pulses:          Dorsalis pedis pulses are 1+ on the right side and 1+ on the left side.       Posterior tibial pulses are 1+ on the right side and 1+ on the left side.     Heart sounds: No murmur heard. No gallop.   Pulmonary:     Effort: Pulmonary effort is normal.     Breath sounds: No stridor. No wheezing, rhonchi or rales.  Abdominal:     General: Abdomen is flat. Bowel sounds are normal. There is no distension.     Palpations: Abdomen is soft. There is no hepatomegaly, splenomegaly or mass.     Tenderness: There is no abdominal tenderness.     Hernia: No hernia is present.  Musculoskeletal:        General: Normal range of motion.     Cervical back: Neck supple.     Right lower leg: No edema.     Left lower leg: No edema.  Feet:     Right foot:     Skin integrity: Skin integrity normal.     Toenail Condition: Right toenails are abnormally thick. Fungal disease present.    Left foot:     Skin integrity: Skin integrity normal.     Toenail Condition: Left toenails are abnormally thick. Fungal disease present. Lymphadenopathy:     Cervical:  No cervical adenopathy.  Skin:    General: Skin is warm and dry.  Neurological:     General: No focal deficit present.     Mental Status: He is alert. Mental status is at baseline.  Psychiatric:        Mood and Affect: Mood normal.        Behavior: Behavior normal.     Lab Results  Component Value Date   WBC 4.8 06/19/2019   HGB 16.0 06/19/2019   HCT 48.1 06/19/2019   PLT 166 06/19/2019   GLUCOSE 151 (H) 02/06/2020   CHOL 105 02/06/2020   TRIG 65.0 02/06/2020   HDL 36.50 (L) 02/06/2020   LDLCALC 55 02/06/2020  ALT 20 02/06/2020   AST 18 02/06/2020   NA 139 02/06/2020   K 4.1 02/06/2020   CL 105 02/06/2020   CREATININE 0.85 02/06/2020   BUN 18 02/06/2020   CO2 28 02/06/2020   TSH 2.40 02/06/2020   PSA 0.98 02/06/2020   INR 1.00 06/02/2017   HGBA1C 7.1 (H) 02/06/2020   MICROALBUR 1.3 02/06/2020    No results found.  Assessment & Plan:   Linda was seen today for hypertension, hyperlipidemia and diabetes.  Diagnoses and all orders for this visit:  Essential hypertension- His blood pressure is adequately well controlled.  Labs are negative for secondary causes or endorgan damage. -     TSH; Future -     Urinalysis, Routine w reflex microscopic; Future -     Basic metabolic panel; Future -     Basic metabolic panel -     Urinalysis, Routine w reflex microscopic -     TSH  Diabetes mellitus due to underlying condition, controlled, without complication, without long-term current use of insulin ([place])- See below. -     Hemoglobin A1c; Future -     Microalbumin / creatinine urine ratio; Future -     Basic metabolic panel; Future -     Ambulatory referral to Ophthalmology -     Basic metabolic panel -     Microalbumin / creatinine urine ratio -     Hemoglobin A1c -     HM Diabetes Foot Exam  Type II diabetes mellitus with manifestations ([place])- His A1c is at 7.1%.  His blood sugar is adequately well controlled. -     Hemoglobin A1c; Future -     Microalbumin /  creatinine urine ratio; Future -     Basic metabolic panel; Future -     Ambulatory referral to Ophthalmology -     Basic metabolic panel -     Microalbumin / creatinine urine ratio -     Hemoglobin A1c -     HM Diabetes Foot Exam  Benign prostatic hyperplasia with urinary hesitancy- His PSA is not rising.  This is a reassuring sign that he does not have prostate cancer.  He is getting adequate symptom relief with alfuzosin. -     PSA; Future -     PSA  Hyperlipidemia LDL goal <100- He has achieved his LDL goal and is doing well on the statin. -     Lipid panel; Future -     Hepatic function panel; Future -     Hepatic function panel -     Lipid panel  Pain due to onychomycosis of toenails of both feet- Will treat with a weekly dose of fluconazole for 12 weeks. -     fluconazole (DIFLUCAN) 150 MG tablet; Take 2 tablets (300 mg total) by mouth once a week.  Attention deficit hyperactivity disorder (ADHD), predominantly inattentive type- Will continue the current dose of Adderall.  Paroxysmal atrial fibrillation ([place])- He is maintaining sinus rhythm.  Will continue anticoagulation with the DOAC.   I am having Mikeal Hawthorne T. Blumenberg "Sam" start on fluconazole. I am also having him maintain his FreeStyle Libre 14 Day Reader, alfuzosin, atorvastatin, metFORMIN, metoprolol tartrate, omeprazole, rivaroxaban, tadalafil, Invokana, Toujeo SoloStar, FreeStyle Libre 14 Day Sensor, BD Pen Needle Micro U/F, amphetamine-dextroamphetamine, and amphetamine-dextroamphetamine.  Meds ordered this encounter  Medications  . fluconazole (DIFLUCAN) 150 MG tablet    Sig: Take 2 tablets (300 mg total) by mouth once a week.    Dispense:  24 tablet    Refill:  0   I spent 50 minutes in preparing to see the patient by review of recent labs, imaging and procedures, obtaining and reviewing separately obtained history, communicating with the patient and family or caregiver, ordering medications, tests or procedures,  and documenting clinical information in the EHR including the differential Dx, treatment, and any further evaluation and other management of 1. Essential hypertension 2. Diabetes mellitus due to underlying condition, controlled, without complication, without long-term current use of insulin ([place]) 3. Type II diabetes mellitus with manifestations ([place]) 4. Benign prostatic hyperplasia with urinary hesitancy 5. Hyperlipidemia LDL goal <100 6. Pain due to onychomycosis of toenails of both feet 7. Attention deficit hyperactivity disorder (ADHD), predominantly inattentive type 8. Paroxysmal atrial fibrillation ([place])     Follow-up: Return in about 3 months (around 05/06/2020).  Scarlette Calico, MD

## 2020-02-06 NOTE — Patient Instructions (Signed)

## 2020-02-10 ENCOUNTER — Telehealth: Payer: Self-pay

## 2020-02-10 NOTE — Telephone Encounter (Signed)
Key: [id number]   Approved 01/11/2020-02/09/2021

## 2020-02-16 ENCOUNTER — Other Ambulatory Visit: Payer: Self-pay | Admitting: Internal Medicine

## 2020-02-16 DIAGNOSIS — N401 Enlarged prostate with lower urinary tract symptoms: Secondary | ICD-10-CM

## 2020-02-16 DIAGNOSIS — R3911 Hesitancy of micturition: Secondary | ICD-10-CM

## 2020-02-17 ENCOUNTER — Encounter: Payer: Self-pay | Admitting: Internal Medicine

## 2020-02-19 ENCOUNTER — Encounter: Payer: Self-pay | Admitting: Internal Medicine

## 2020-02-20 ENCOUNTER — Encounter: Payer: Self-pay | Admitting: Internal Medicine

## 2020-02-20 ENCOUNTER — Other Ambulatory Visit: Payer: Self-pay | Admitting: Internal Medicine

## 2020-02-20 DIAGNOSIS — B309 Viral conjunctivitis, unspecified: Secondary | ICD-10-CM | POA: Insufficient documentation

## 2020-02-20 MED ORDER — NEOMYCIN-POLYMYXIN-HC 3.5-10000-1 OP SUSP: 3.0000 [drp] | Freq: Four times a day (QID) | OPHTHALMIC | 0 refills | Status: DC

## 2020-02-20 NOTE — Telephone Encounter (Signed)
°  Patient went to the pharmacy to pick up the neomycin-polymyxin-hydrocortisone (CORTISPORIN) 3.5-10000-1 ophthalmic suspension and they said that the medication is no longer made and they would need a prescription for something else, they recommended neopoly dex instead.  [id number] Gin[place] Otto, [place] - [address] AT [facility] OF [address] Phone:  [phone]  Fax:  [phone]

## 2020-02-21 ENCOUNTER — Other Ambulatory Visit: Payer: Self-pay | Admitting: Internal Medicine

## 2020-02-21 ENCOUNTER — Ambulatory Visit: Payer: 59 | Admitting: Family

## 2020-02-21 DIAGNOSIS — B309 Viral conjunctivitis, unspecified: Secondary | ICD-10-CM

## 2020-02-21 MED ORDER — NEOMYCIN-POLYMYXIN-DEXAMETH 3.5-10000-0.1 OP SUSP: 2.0000 [drp] | Freq: Four times a day (QID) | OPHTHALMIC | 0 refills | Status: DC

## 2020-02-26 ENCOUNTER — Other Ambulatory Visit: Payer: Self-pay | Admitting: Internal Medicine

## 2020-02-26 DIAGNOSIS — K219 Gastro-esophageal reflux disease without esophagitis: Secondary | ICD-10-CM

## 2020-02-28 ENCOUNTER — Other Ambulatory Visit: Payer: Self-pay | Admitting: Internal Medicine

## 2020-02-28 DIAGNOSIS — N5201 Erectile dysfunction due to arterial insufficiency: Secondary | ICD-10-CM

## 2020-03-04 ENCOUNTER — Other Ambulatory Visit: Payer: Self-pay | Admitting: Internal Medicine

## 2020-03-04 DIAGNOSIS — E118 Type 2 diabetes mellitus with unspecified complications: Secondary | ICD-10-CM

## 2020-03-08 ENCOUNTER — Other Ambulatory Visit: Payer: Self-pay | Admitting: Internal Medicine

## 2020-03-08 DIAGNOSIS — F900 Attention-deficit hyperactivity disorder, predominantly inattentive type: Secondary | ICD-10-CM

## 2020-03-08 DIAGNOSIS — B309 Viral conjunctivitis, unspecified: Secondary | ICD-10-CM

## 2020-03-09 MED ORDER — AMPHETAMINE-DEXTROAMPHETAMINE 20 MG PO TABS: 20.0000 mg | ORAL_TABLET | Freq: Every day | ORAL | 0 refills | Status: DC | PRN

## 2020-03-09 MED ORDER — AMPHETAMINE-DEXTROAMPHET ER 30 MG PO CP24: 30.0000 mg | ORAL_CAPSULE | ORAL | 0 refills | Status: DC

## 2020-03-17 ENCOUNTER — Telehealth: Payer: Self-pay

## 2020-03-17 NOTE — Telephone Encounter (Signed)
**Note De-Identified Sally Menard Obfuscation** I started a Xarelto PA through covermymeds. Key: Zackery Barefoot

## 2020-03-31 ENCOUNTER — Other Ambulatory Visit: Payer: Self-pay | Admitting: Internal Medicine

## 2020-04-05 ENCOUNTER — Other Ambulatory Visit: Payer: Self-pay | Admitting: Internal Medicine

## 2020-04-05 ENCOUNTER — Encounter: Payer: Self-pay | Admitting: Internal Medicine

## 2020-04-05 DIAGNOSIS — E785 Hyperlipidemia, unspecified: Secondary | ICD-10-CM

## 2020-04-05 DIAGNOSIS — F900 Attention-deficit hyperactivity disorder, predominantly inattentive type: Secondary | ICD-10-CM

## 2020-04-06 ENCOUNTER — Other Ambulatory Visit: Payer: Self-pay | Admitting: Internal Medicine

## 2020-04-06 DIAGNOSIS — E118 Type 2 diabetes mellitus with unspecified complications: Secondary | ICD-10-CM

## 2020-04-06 DIAGNOSIS — F900 Attention-deficit hyperactivity disorder, predominantly inattentive type: Secondary | ICD-10-CM

## 2020-04-06 MED ORDER — AMPHETAMINE-DEXTROAMPHETAMINE 20 MG PO TABS
20.0000 mg | ORAL_TABLET | Freq: Every day | ORAL | 0 refills | Status: DC | PRN
Start: 2020-04-06 — End: 2020-05-03

## 2020-04-06 MED ORDER — AMPHETAMINE-DEXTROAMPHET ER 30 MG PO CP24: 30.0000 mg | ORAL_CAPSULE | ORAL | 0 refills | Status: DC

## 2020-04-06 MED ORDER — DAPAGLIFLOZIN PROPANEDIOL 10 MG PO TABS: 10.0000 mg | ORAL_TABLET | Freq: Every day | ORAL | 1 refills | Status: DC

## 2020-05-03 ENCOUNTER — Other Ambulatory Visit: Payer: Self-pay | Admitting: Internal Medicine

## 2020-05-03 DIAGNOSIS — F900 Attention-deficit hyperactivity disorder, predominantly inattentive type: Secondary | ICD-10-CM

## 2020-05-04 MED ORDER — AMPHETAMINE-DEXTROAMPHET ER 30 MG PO CP24: 30.0000 mg | ORAL_CAPSULE | ORAL | 0 refills | Status: DC

## 2020-05-04 MED ORDER — AMPHETAMINE-DEXTROAMPHETAMINE 20 MG PO TABS: 20.0000 mg | ORAL_TABLET | Freq: Every day | ORAL | 0 refills | Status: DC | PRN

## 2020-05-06 LAB — HM DIABETES EYE EXAM

## 2020-06-02 ENCOUNTER — Other Ambulatory Visit: Payer: Self-pay | Admitting: Internal Medicine

## 2020-06-02 DIAGNOSIS — F900 Attention-deficit hyperactivity disorder, predominantly inattentive type: Secondary | ICD-10-CM

## 2020-06-04 MED ORDER — AMPHETAMINE-DEXTROAMPHET ER 30 MG PO CP24
30.0000 mg | ORAL_CAPSULE | ORAL | 0 refills | Status: DC
Start: 2020-06-04 — End: 2020-07-06

## 2020-06-04 MED ORDER — AMPHETAMINE-DEXTROAMPHETAMINE 20 MG PO TABS: 20.0000 mg | ORAL_TABLET | Freq: Every day | ORAL | 0 refills | Status: DC | PRN

## 2020-07-03 ENCOUNTER — Telehealth: Payer: Self-pay

## 2020-07-03 NOTE — Telephone Encounter (Signed)
Key: [id number]  Approved 06/03/2020;-07/03/2021

## 2020-07-06 ENCOUNTER — Other Ambulatory Visit: Payer: Self-pay | Admitting: Internal Medicine

## 2020-07-06 DIAGNOSIS — F900 Attention-deficit hyperactivity disorder, predominantly inattentive type: Secondary | ICD-10-CM

## 2020-07-06 MED ORDER — AMPHETAMINE-DEXTROAMPHETAMINE 20 MG PO TABS: 20.0000 mg | ORAL_TABLET | Freq: Every day | ORAL | 0 refills | Status: DC | PRN

## 2020-07-06 MED ORDER — AMPHETAMINE-DEXTROAMPHET ER 30 MG PO CP24: 30.0000 mg | ORAL_CAPSULE | ORAL | 0 refills | Status: DC

## 2020-07-08 ENCOUNTER — Other Ambulatory Visit: Payer: Self-pay | Admitting: Cardiology

## 2020-07-08 DIAGNOSIS — I480 Paroxysmal atrial fibrillation: Secondary | ICD-10-CM

## 2020-07-29 ENCOUNTER — Other Ambulatory Visit: Payer: Self-pay | Admitting: Internal Medicine

## 2020-07-29 DIAGNOSIS — E118 Type 2 diabetes mellitus with unspecified complications: Secondary | ICD-10-CM

## 2020-08-04 ENCOUNTER — Telehealth: Payer: Self-pay | Admitting: Internal Medicine

## 2020-08-04 ENCOUNTER — Other Ambulatory Visit: Payer: Self-pay | Admitting: Internal Medicine

## 2020-08-04 DIAGNOSIS — E118 Type 2 diabetes mellitus with unspecified complications: Secondary | ICD-10-CM

## 2020-08-04 DIAGNOSIS — F900 Attention-deficit hyperactivity disorder, predominantly inattentive type: Secondary | ICD-10-CM

## 2020-08-05 NOTE — Telephone Encounter (Signed)
    Patient is requesting a short supply until he can be seen on 08-26-20. Please advise

## 2020-08-06 NOTE — Telephone Encounter (Addendum)
Called pt, LVM.   Rx still denied per PCP Controlled substance requested Last OV was 02/06/20 overdue for follow up

## 2020-08-06 NOTE — Telephone Encounter (Signed)
Pt has been informed and expressed understanding.  

## 2020-08-12 ENCOUNTER — Other Ambulatory Visit: Payer: Self-pay | Admitting: Internal Medicine

## 2020-08-12 DIAGNOSIS — R3911 Hesitancy of micturition: Secondary | ICD-10-CM

## 2020-08-12 DIAGNOSIS — N5201 Erectile dysfunction due to arterial insufficiency: Secondary | ICD-10-CM

## 2020-08-12 DIAGNOSIS — N401 Enlarged prostate with lower urinary tract symptoms: Secondary | ICD-10-CM

## 2020-08-26 ENCOUNTER — Encounter: Payer: Self-pay | Admitting: Internal Medicine

## 2020-08-26 ENCOUNTER — Other Ambulatory Visit: Payer: Self-pay

## 2020-08-26 ENCOUNTER — Ambulatory Visit (INDEPENDENT_AMBULATORY_CARE_PROVIDER_SITE_OTHER): Payer: 59 | Admitting: Internal Medicine

## 2020-08-26 VITALS — BP 122/68 | HR 65 | Temp 97.8°F | Resp 16 | Ht 68.0 in | Wt 178.0 lb

## 2020-08-26 DIAGNOSIS — K429 Umbilical hernia without obstruction or gangrene: Secondary | ICD-10-CM | POA: Insufficient documentation

## 2020-08-26 DIAGNOSIS — E785 Hyperlipidemia, unspecified: Secondary | ICD-10-CM

## 2020-08-26 DIAGNOSIS — E118 Type 2 diabetes mellitus with unspecified complications: Secondary | ICD-10-CM

## 2020-08-26 DIAGNOSIS — K219 Gastro-esophageal reflux disease without esophagitis: Secondary | ICD-10-CM | POA: Diagnosis not present

## 2020-08-26 DIAGNOSIS — E119 Type 2 diabetes mellitus without complications: Secondary | ICD-10-CM

## 2020-08-26 DIAGNOSIS — Z794 Long term (current) use of insulin: Secondary | ICD-10-CM

## 2020-08-26 DIAGNOSIS — I480 Paroxysmal atrial fibrillation: Secondary | ICD-10-CM

## 2020-08-26 DIAGNOSIS — R3911 Hesitancy of micturition: Secondary | ICD-10-CM

## 2020-08-26 DIAGNOSIS — N401 Enlarged prostate with lower urinary tract symptoms: Secondary | ICD-10-CM

## 2020-08-26 DIAGNOSIS — Z23 Encounter for immunization: Secondary | ICD-10-CM | POA: Diagnosis not present

## 2020-08-26 DIAGNOSIS — F900 Attention-deficit hyperactivity disorder, predominantly inattentive type: Secondary | ICD-10-CM | POA: Diagnosis not present

## 2020-08-26 DIAGNOSIS — N5201 Erectile dysfunction due to arterial insufficiency: Secondary | ICD-10-CM

## 2020-08-26 LAB — BASIC METABOLIC PANEL
BUN: 23 mg/dL (ref 6–23)
CO2: 31 mEq/L (ref 19–32)
Calcium: 10.4 mg/dL (ref 8.4–10.5)
Chloride: 101 mEq/L (ref 96–112)
Creatinine, Ser: 1.06 mg/dL (ref 0.40–1.50)
GFR: 73.72 mL/min (ref >60.00)
Glucose, Bld: 164 mg/dL — ABNORMAL HIGH (ref 70–99)
Potassium: 5.1 mEq/L (ref 3.5–5.1)
Sodium: 139 mEq/L (ref 135–145)

## 2020-08-26 LAB — HEMOGLOBIN A1C: Hgb A1c MFr Bld: 7 % — ABNORMAL HIGH (ref 4.6–6.5)

## 2020-08-26 LAB — TSH: TSH: 1.73 u[IU]/mL (ref 0.35–5.50)

## 2020-08-26 MED ORDER — ATORVASTATIN CALCIUM 10 MG PO TABS: ORAL_TABLET | ORAL | 1 refills | Status: DC

## 2020-08-26 MED ORDER — METFORMIN HCL 1000 MG PO TABS
1000.0000 mg | ORAL_TABLET | Freq: Two times a day (BID) | ORAL | 3 refills | Status: DC
Start: 2020-08-26 — End: 2021-09-30

## 2020-08-26 MED ORDER — TOUJEO SOLOSTAR 300 UNIT/ML ~~LOC~~ SOPN: 20.0000 [IU] | PEN_INJECTOR | SUBCUTANEOUS | 1 refills | Status: DC

## 2020-08-26 MED ORDER — AMPHETAMINE-DEXTROAMPHETAMINE 20 MG PO TABS: 20.0000 mg | ORAL_TABLET | Freq: Every day | ORAL | 0 refills | Status: DC | PRN

## 2020-08-26 MED ORDER — FREESTYLE LIBRE 14 DAY READER DEVI
1.0000 | Freq: Every day | 5 refills | Status: DC
Start: 2020-08-26 — End: 2021-09-30

## 2020-08-26 MED ORDER — ALFUZOSIN HCL ER 10 MG PO TB24: ORAL_TABLET | ORAL | 1 refills | Status: DC

## 2020-08-26 MED ORDER — METOPROLOL TARTRATE 25 MG PO TABS: 12.5000 mg | ORAL_TABLET | Freq: Two times a day (BID) | ORAL | 1 refills | Status: DC

## 2020-08-26 MED ORDER — OMEPRAZOLE 20 MG PO CPDR: DELAYED_RELEASE_CAPSULE | ORAL | 1 refills | Status: DC

## 2020-08-26 MED ORDER — AMPHETAMINE-DEXTROAMPHET ER 30 MG PO CP24: 30.0000 mg | ORAL_CAPSULE | ORAL | 0 refills | Status: DC

## 2020-08-26 MED ORDER — FREESTYLE LIBRE 14 DAY SENSOR MISC: 5 refills | Status: DC

## 2020-08-26 MED ORDER — TADALAFIL 20 MG PO TABS: ORAL_TABLET | ORAL | 5 refills | Status: DC

## 2020-08-26 MED ORDER — DAPAGLIFLOZIN PROPANEDIOL 10 MG PO TABS: 10.0000 mg | ORAL_TABLET | Freq: Every day | ORAL | 1 refills | Status: DC

## 2020-08-26 MED ORDER — RIVAROXABAN 20 MG PO TABS: ORAL_TABLET | ORAL | 1 refills | Status: DC

## 2020-08-26 NOTE — Progress Notes (Signed)
Umbilical hernia   Subjective:  Patient ID: Michael Li, male    DOB: April 03, 1955  Age: 65 y.o. MRN: [id number]  CC: Diabetes  This visit occurred during the SARS-CoV-2 public health emergency.  Safety protocols were in place, including screening questions prior to the visit, additional usage of staff PPE, and extensive cleaning of exam room while observing appropriate contact time as indicated for disinfecting solutions.    HPI DRAYCEN LEICHTER presents for f/up -  He complains that he has developed an umbilical hernia since I last saw him.  He would like to see a surgeon about this.  He tells me his blood sugars have been well controlled.  He denies polys.  He is active and denies any recent episodes of chest pain, shortness of breath, palpitations, edema, or fatigue.   Pt states ADD status overall stable on current meds with overall good compliance and tolerability, and good effectiveness with respect to ability for concentration and task completion.   Outpatient Medications Prior to Visit  Medication Sig Dispense Refill   BD PEN NEEDLE MICRO U/F 32G X 6 MM MISC USE 2 PEN NEEDLES DAILY 200 each 3   neomycin-polymyxin b-dexamethasone (MAXITROL) 3.5-10000-0.1 SUSP Place 2 drops into both eyes every 6 (six) hours. 5 mL 0   neomycin-polymyxin-hydrocortisone (CORTISPORIN) 3.5-10000-1 ophthalmic suspension Place 3 drops into both eyes 4 (four) times daily. 7.5 mL 0   alfuzosin (UROXATRAL) 10 MG 24 hr tablet TAKE 1 TABLET(10 MG) BY MOUTH DAILY WITH BREAKFAST 90 tablet 1   amphetamine-dextroamphetamine (ADDERALL XR) 30 MG 24 hr capsule Take 1 capsule (30 mg total) by mouth every morning. 30 capsule 0   amphetamine-dextroamphetamine (ADDERALL) 20 MG tablet Take 1 tablet (20 mg total) by mouth daily as needed (in the afternoon if needed for focus.). 30 tablet 0   atorvastatin (LIPITOR) 10 MG tablet TAKE 1 TABLET(10 MG) BY MOUTH DAILY 90 tablet 1   Continuous Blood Gluc Receiver (FREESTYLE LIBRE 14  DAY READER) DEVI 1 Act by Does not apply route daily. 1 each 4   Continuous Blood Gluc Sensor (FREESTYLE LIBRE 14 DAY SENSOR) MISC USE AS DIRECTED EVERY 14 DAYS 2 each 5   dapagliflozin propanediol (FARXIGA) 10 MG TABS tablet Take 1 tablet (10 mg total) by mouth daily before breakfast. 90 tablet 1   insulin glargine, 1 Unit Dial, (TOUJEO SOLOSTAR) 300 UNIT/ML Solostar Pen Inject 20 Units into the skin every morning. 4.5 mL 1   metFORMIN (GLUCOPHAGE) 1000 MG tablet Take 1 tablet (1,000 mg total) by mouth 2 (two) times daily with a meal. 180 tablet 3   metoprolol tartrate (LOPRESSOR) 25 MG tablet Take 0.5 tablets (12.5 mg total) by mouth 2 (two) times daily. Patient needs appointment for further refills. 1 st attempt 30 tablet 0   omeprazole (PRILOSEC) 20 MG capsule TAKE 1 CAPSULE(20 MG) BY MOUTH EVERY EVENING 90 capsule 1   tadalafil (CIALIS) 20 MG tablet TAKE 1 TABLET BY MOUTH DAILY AS NEEDED FOR E.D. 10 tablet 5   XARELTO 20 MG TABS tablet TAKE 1 TABLET(20 MG) BY MOUTH DAILY WITH SUPPER 90 tablet 1   No facility-administered medications prior to visit.    ROS Review of Systems  Constitutional:  Negative for diaphoresis and fatigue.  HENT: Negative.    Eyes: Negative.  Negative for visual disturbance.  Respiratory:  Negative for cough, chest tightness, shortness of breath and wheezing.   Cardiovascular:  Negative for chest pain, palpitations and leg swelling.  Gastrointestinal:  Negative for abdominal pain, constipation, diarrhea, nausea and vomiting.  Endocrine: Negative.  Negative for polydipsia, polyphagia and polyuria.  Genitourinary: Negative.   Musculoskeletal:  Negative for arthralgias and myalgias.  Skin: Negative.  Negative for color change and pallor.  Neurological:  Negative for dizziness.  Hematological:  Negative for adenopathy. Does not bruise/bleed easily.  Psychiatric/Behavioral:  Positive for decreased concentration. Negative for behavioral problems, confusion and sleep  disturbance. The patient is not nervous/anxious.    Objective:  BP 122/68 (BP Location: Left Arm, Patient Position: Sitting, Cuff Size: Large)   Pulse 65   Temp 97.8 F (36.6 C) (Oral)   Resp 16   Ht 5\' 8"  (1.727 m)   Wt 178 lb (80.7 kg)   SpO2 92%   BMI 27.06 kg/m   BP Readings from Last 3 Encounters:  08/26/20 122/68  02/06/20 138/84  01/13/20 138/80    Wt Readings from Last 3 Encounters:  08/26/20 178 lb (80.7 kg)  02/06/20 189 lb (85.7 kg)  01/13/20 188 lb 3.2 oz (85.4 kg)    Physical Exam Vitals reviewed.  HENT:     Nose: Nose normal.     Mouth/Throat:     Mouth: Mucous membranes are moist.  Eyes:     General: No scleral icterus.    Conjunctiva/sclera: Conjunctivae normal.  Cardiovascular:     Rate and Rhythm: Normal rate and regular rhythm.     Heart sounds: No murmur heard. Pulmonary:     Effort: Pulmonary effort is normal.     Breath sounds: No stridor. No wheezing, rhonchi or rales.  Abdominal:     General: Abdomen is flat. Bowel sounds are normal. There is no distension.     Palpations: Abdomen is soft. There is no fluid wave, hepatomegaly or mass.     Tenderness: There is no abdominal tenderness. There is no guarding.     Hernia: A hernia is present. Hernia is present in the umbilical area. There is no hernia in the ventral area.  Musculoskeletal:        General: Normal range of motion.     Cervical back: Neck supple.     Right lower leg: No edema.     Left lower leg: No edema.  Lymphadenopathy:     Cervical: No cervical adenopathy.  Skin:    General: Skin is warm and dry.  Neurological:     General: No focal deficit present.     Mental Status: He is alert.  Psychiatric:        Mood and Affect: Mood normal.        Behavior: Behavior normal.    Lab Results  Component Value Date   WBC 4.8 06/19/2019   HGB 16.0 06/19/2019   HCT 48.1 06/19/2019   PLT 166 06/19/2019   GLUCOSE 164 (H) 08/26/2020   CHOL 105 02/06/2020   TRIG 65.0 02/06/2020    HDL 36.50 (L) 02/06/2020   LDLCALC 55 02/06/2020   ALT 20 02/06/2020   AST 18 02/06/2020   NA 139 08/26/2020   K 5.1 08/26/2020   CL 101 08/26/2020   CREATININE 1.06 08/26/2020   BUN 23 08/26/2020   CO2 31 08/26/2020   TSH 1.73 08/26/2020   PSA 0.98 02/06/2020   INR 1.00 06/02/2017   HGBA1C 7.0 (H) 08/26/2020   MICROALBUR 1.3 02/06/2020    No results found.  Assessment & Plan:   Bynum was seen today for diabetes.  Diagnoses and all orders for this visit:  Type  II diabetes mellitus with manifestations ([place])- His blood sugar is adequately well controlled. -     dapagliflozin propanediol (FARXIGA) 10 MG TABS tablet; Take 1 tablet (10 mg total) by mouth daily before breakfast. -     metFORMIN (GLUCOPHAGE) 1000 MG tablet; Take 1 tablet (1,000 mg total) by mouth 2 (two) times daily with a meal. -     insulin glargine, 1 Unit Dial, (TOUJEO SOLOSTAR) 300 UNIT/ML Solostar Pen; Inject 20 Units into the skin every morning. -     Continuous Blood Gluc Receiver (FREESTYLE LIBRE 14 DAY READER) DEVI; 1 Act by Does not apply route daily. -     Continuous Blood Gluc Sensor (FREESTYLE LIBRE 14 DAY SENSOR) MISC; USE AS DIRECTED EVERY 14 DAYS -     Basic metabolic panel; Future -     Hemoglobin A1c; Future -     Hemoglobin A1c -     Basic metabolic panel  Gastroesophageal reflux disease without esophagitis -     omeprazole (PRILOSEC) 20 MG capsule; TAKE 1 CAPSULE(20 MG) BY MOUTH EVERY EVENING  Benign prostatic hyperplasia with urinary hesitancy -     alfuzosin (UROXATRAL) 10 MG 24 hr tablet; TAKE 1 TABLET(10 MG) BY MOUTH DAILY WITH BREAKFAST  Attention deficit hyperactivity disorder (ADHD), predominantly inattentive type -     amphetamine-dextroamphetamine (ADDERALL XR) 30 MG 24 hr capsule; Take 1 capsule (30 mg total) by mouth every morning. -     amphetamine-dextroamphetamine (ADDERALL) 20 MG tablet; Take 1 tablet (20 mg total) by mouth daily as needed (in the afternoon if needed for  focus.).  Hyperlipidemia LDL goal <100 -     atorvastatin (LIPITOR) 10 MG tablet; TAKE 1 TABLET(10 MG) BY MOUTH DAILY -     TSH; Future -     TSH  Paroxysmal atrial fibrillation ([place])- He is maintaining sinus rhythm.  Will continue metoprolol and the DOAC. -     metoprolol tartrate (LOPRESSOR) 25 MG tablet; Take 0.5 tablets (12.5 mg total) by mouth 2 (two) times daily. Patient needs appointment for further refills. 1 st attempt  Erectile dysfunction due to arterial insufficiency -     tadalafil (CIALIS) 20 MG tablet; TAKE 1 TABLET BY MOUTH DAILY AS NEEDED FOR E.D.  Need for vaccination -     Pneumococcal conjugate vaccine 20-valent  PAF (paroxysmal atrial fibrillation) ([facility]) -     rivaroxaban (XARELTO) 20 MG TABS tablet; TAKE 1 TABLET(20 MG) BY MOUTH DAILY WITH SUPPER -     TSH; Future -     TSH  Insulin-requiring or dependent type II diabetes mellitus ([place]) -     Continuous Blood Gluc Receiver (FREESTYLE LIBRE 14 DAY READER) DEVI; 1 Act by Does not apply route daily. -     Continuous Blood Gluc Sensor (FREESTYLE LIBRE 14 DAY SENSOR) MISC; USE AS DIRECTED EVERY 14 DAYS  Umbilical hernia without obstruction and without gangrene -     Ambulatory referral to General Surgery  I have changed Mikeal Hawthorne T. Galant "Sam"'s Xarelto to rivaroxaban. I am also having him maintain his BD Pen Needle Micro U/F, neomycin-polymyxin-hydrocortisone, neomycin-polymyxin b-dexamethasone, alfuzosin, amphetamine-dextroamphetamine, atorvastatin, dapagliflozin propanediol, metFORMIN, metoprolol tartrate, Toujeo SoloStar, omeprazole, tadalafil, FreeStyle Libre 14 Day Reader, [employer] 14 Day Sensor, and amphetamine-dextroamphetamine.  Meds ordered this encounter  Medications   alfuzosin (UROXATRAL) 10 MG 24 hr tablet    Sig: TAKE 1 TABLET(10 MG) BY MOUTH DAILY WITH BREAKFAST    Dispense:  90 tablet    Refill:  1  amphetamine-dextroamphetamine (ADDERALL XR) 30 MG 24 hr capsule    Sig: Take 1 capsule (30  mg total) by mouth every morning.    Dispense:  30 capsule    Refill:  0   atorvastatin (LIPITOR) 10 MG tablet    Sig: TAKE 1 TABLET(10 MG) BY MOUTH DAILY    Dispense:  90 tablet    Refill:  1   dapagliflozin propanediol (FARXIGA) 10 MG TABS tablet    Sig: Take 1 tablet (10 mg total) by mouth daily before breakfast.    Dispense:  90 tablet    Refill:  1   metFORMIN (GLUCOPHAGE) 1000 MG tablet    Sig: Take 1 tablet (1,000 mg total) by mouth 2 (two) times daily with a meal.    Dispense:  180 tablet    Refill:  3   metoprolol tartrate (LOPRESSOR) 25 MG tablet    Sig: Take 0.5 tablets (12.5 mg total) by mouth 2 (two) times daily. Patient needs appointment for further refills. 1 st attempt    Dispense:  180 tablet    Refill:  1   insulin glargine, 1 Unit Dial, (TOUJEO SOLOSTAR) 300 UNIT/ML Solostar Pen    Sig: Inject 20 Units into the skin every morning.    Dispense:  4.5 mL    Refill:  1   omeprazole (PRILOSEC) 20 MG capsule    Sig: TAKE 1 CAPSULE(20 MG) BY MOUTH EVERY EVENING    Dispense:  90 capsule    Refill:  1   tadalafil (CIALIS) 20 MG tablet    Sig: TAKE 1 TABLET BY MOUTH DAILY AS NEEDED FOR E.D.    Dispense:  10 tablet    Refill:  5   rivaroxaban (XARELTO) 20 MG TABS tablet    Sig: TAKE 1 TABLET(20 MG) BY MOUTH DAILY WITH SUPPER    Dispense:  90 tablet    Refill:  1   Continuous Blood Gluc Receiver (FREESTYLE LIBRE 14 DAY READER) DEVI    Sig: 1 Act by Does not apply route daily.    Dispense:  2 each    Refill:  5   Continuous Blood Gluc Sensor (FREESTYLE LIBRE 14 DAY SENSOR) MISC    Sig: USE AS DIRECTED EVERY 14 DAYS    Dispense:  2 each    Refill:  5   amphetamine-dextroamphetamine (ADDERALL) 20 MG tablet    Sig: Take 1 tablet (20 mg total) by mouth daily as needed (in the afternoon if needed for focus.).    Dispense:  30 tablet    Refill:  0     Follow-up: Return in about 6 months (around 02/26/2021).  Scarlette Calico, MD

## 2020-08-26 NOTE — Patient Instructions (Signed)

## 2020-08-28 ENCOUNTER — Other Ambulatory Visit: Payer: Self-pay | Admitting: Internal Medicine

## 2020-08-28 DIAGNOSIS — K219 Gastro-esophageal reflux disease without esophagitis: Secondary | ICD-10-CM

## 2020-09-27 ENCOUNTER — Encounter: Payer: Self-pay | Admitting: Internal Medicine

## 2020-09-28 ENCOUNTER — Other Ambulatory Visit: Payer: Self-pay | Admitting: Internal Medicine

## 2020-09-28 DIAGNOSIS — F900 Attention-deficit hyperactivity disorder, predominantly inattentive type: Secondary | ICD-10-CM

## 2020-09-28 MED ORDER — AMPHETAMINE-DEXTROAMPHETAMINE 20 MG PO TABS
20.0000 mg | ORAL_TABLET | Freq: Every day | ORAL | 0 refills | Status: DC | PRN
Start: 2020-09-28 — End: 2020-10-25

## 2020-09-28 MED ORDER — AMPHETAMINE-DEXTROAMPHET ER 30 MG PO CP24: 30.0000 mg | ORAL_CAPSULE | ORAL | 0 refills | Status: DC

## 2020-10-25 ENCOUNTER — Other Ambulatory Visit: Payer: Self-pay | Admitting: Internal Medicine

## 2020-10-25 DIAGNOSIS — F900 Attention-deficit hyperactivity disorder, predominantly inattentive type: Secondary | ICD-10-CM

## 2020-10-27 MED ORDER — AMPHETAMINE-DEXTROAMPHETAMINE 20 MG PO TABS: 20.0000 mg | ORAL_TABLET | Freq: Every day | ORAL | 0 refills | Status: DC | PRN

## 2020-10-27 MED ORDER — AMPHETAMINE-DEXTROAMPHET ER 30 MG PO CP24: 30.0000 mg | ORAL_CAPSULE | ORAL | 0 refills | Status: DC

## 2020-11-04 ENCOUNTER — Encounter: Payer: Self-pay | Admitting: Internal Medicine

## 2020-11-05 ENCOUNTER — Telehealth: Payer: Self-pay | Admitting: *Deleted

## 2020-11-05 DIAGNOSIS — R931 Abnormal findings on diagnostic imaging of heart and coronary circulation: Secondary | ICD-10-CM

## 2020-11-05 NOTE — Telephone Encounter (Signed)
   [facility] Pre-operative Risk Assessment    Patient Name: Michael Li  DOB: Dec 21, 1955 MRN: [id number]  HEARTCARE STAFF:  - IMPORTANT!!!!!! Under Visit Info/Reason for Call, type in Other and utilize the format Clearance MM/DD/YY or Clearance TBD. Do not use dashes or single digits. - Please review there is not already an duplicate clearance open for this procedure. - If request is for dental extraction, please clarify the # of teeth to be extracted. - If the patient is currently at the dentist's office, call Pre-Op Callback Staff (MA/nurse) to input urgent request.  - If the patient is not currently in the dentist office, please route to the Pre-Op pool.  Request for surgical clearance:  What type of surgery is being performed? UMBILICAL HERNIA SURGERY  When is this surgery scheduled? TBD  What type of clearance is required (medical clearance vs. Pharmacy clearance to hold med vs. Both)? BOTH  Are there any medications that need to be held prior to surgery and how long? [facility]  Practice name and name of physician performing surgery? [facility]; DR. Gurney Maxin  What is the office phone number? [phone]   7.   What is the office fax number? [place], CMA  8.   Anesthesia type (None, local, MAC, general) ? GENERAL   Julaine Hua 11/05/2020, 2:46 PM  _________________________________________________________________   (provider comments below)

## 2020-11-06 DIAGNOSIS — R931 Abnormal findings on diagnostic imaging of heart and coronary circulation: Secondary | ICD-10-CM | POA: Insufficient documentation

## 2020-11-06 NOTE — Telephone Encounter (Signed)
Patient with diagnosis of afib on Xarelto for anticoagulation.    Procedure: umbilical hernia surgery Date of procedure: TBD  CHA2DS2-VASc Score = 4  This indicates a 4.8% annual risk of stroke. The patient's score is based upon: CHF History: 0 HTN History: 1 Diabetes History: 1 Stroke History: 0 Vascular Disease History: 1 Age Score: 1 Gender Score: 0   CrCl 11mL/min Platelet count 166K  Per office protocol, patient can hold Xarelto for 2 days prior to procedure.

## 2020-11-09 NOTE — Telephone Encounter (Signed)
Pt is agreeable to plan of care and has been scheduled to see Oda Kilts, [facility] for pre op clearance 11/12/20 @ 8:20. Will send notes to PA for appt. Will send FYI to surgeon's office pt has appt.

## 2020-11-09 NOTE — Telephone Encounter (Signed)
   Name: Michael Li  DOB: 1955/05/28  MRN: [id number]  Primary Cardiologist: EP - Dr. Curt Bears  Chart reviewed as part of pre-operative protocol coverage. Because of [name] past medical history and time since last visit, he will require a follow-up visit in order to better assess preoperative cardiovascular risk.   Last OV was in 12/2019, recommended to f/u in 6 months but do not see this occurred yet. Patient is on flecainide and will need EKG at OV.  Pre-op covering staff: - Please schedule appointment and call patient to inform them. If patient already had an upcoming appointment within acceptable timeframe, please add "pre-op clearance" to the appointment notes so provider is aware. - Please contact requesting surgeon's office via preferred method (i.e, phone, fax) to inform them of need for appointment prior to surgery.  Pharmacy recs will be available below for cardiology provider to reference at time of pre-op clearance OV.   Charlie Pitter, PA-C  11/09/2020, 12:19 PM

## 2020-11-11 NOTE — Progress Notes (Signed)
PCP:  Janith Lima, MD Primary Cardiologist: None Electrophysiologist: Will Meredith Leeds, MD   Michael Li is a 65 y.o. male seen today for Will Meredith Leeds, MD for routine electrophysiology followup.  Since last being seen in our clinic the patient reports very well. He denies any undue SOB. he denies chest pain, palpitations,  PND, orthopnea, nausea, vomiting, dizziness, syncope, edema, weight gain, or early satiety. He is "feeling great".   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   Basal cell   Past Surgical History:  Procedure Laterality Date   APPENDECTOMY     ATRIAL FIBRILLATION ABLATION N/A 03/27/2019   Procedure: ATRIAL FIBRILLATION ABLATION;  Surgeon: Constance Haw, MD;  Location: [place] [facility];  Service: Cardiovascular;  Laterality: N/A;   ATRIAL FIBRILLATION ABLATION N/A 06/19/2019   Procedure: ATRIAL FIBRILLATION ABLATION;  Surgeon: Constance Haw, MD;  Location: [place] [facility];  Service: Cardiovascular;  Laterality: N/A;   carotid injury       post tonsillectomy, R carotid injury with trach   HERNIA REPAIR  1973   inguinal- R side   KNEE SURGERY Right    LUMBAR LAMINECTOMY/DECOMPRESSION MICRODISCECTOMY Right 09/12/2012   Procedure: LUMBAR LAMINECTOMY/DECOMPRESSION MICRODISCECTOMY 1 LEVEL;  Surgeon: Charlie Pitter, MD;  Location: [place] [facility];  Service: Neurosurgery;  Laterality: Right;  Right lumbar three-four microdiscectomy   ORIF SHOULDER FRACTURE Right 06/02/2017    Procedure: OPEN REDUCTION INTERNAL FIXATION (ORIF) RIGHT GLENOID FRACTURE, POSSIBLE LATARJET CORACOID TRANSFER;  Surgeon: Meredith Pel, MD;  Location: [place];  Service: Orthopedics;  Laterality: Right;   TEE WITHOUT CARDIOVERSION N/A 06/19/2019   Procedure: TRANSESOPHAGEAL ECHOCARDIOGRAM (TEE);  Surgeon: Thayer Headings, MD;  Location: [place] [facility];  Service: Cardiovascular;  Laterality: N/A;   TONSILLECTOMY     TRACHEOSTOMY CLOSURE  1981    Current Outpatient Medications  Medication Sig Dispense Refill   alfuzosin (UROXATRAL) 10 MG 24 hr tablet TAKE 1 TABLET(10 MG) BY MOUTH DAILY WITH BREAKFAST 90 tablet 1   amphetamine-dextroamphetamine (ADDERALL XR) 30 MG 24 hr capsule Take 1 capsule (30 mg total) by mouth every morning. 30 capsule 0   amphetamine-dextroamphetamine (ADDERALL) 20 MG tablet Take 1 tablet (20 mg total) by mouth daily as needed (in the afternoon if needed for focus.). 30 tablet 0   atorvastatin (LIPITOR) 10 MG tablet TAKE 1 TABLET(10 MG) BY MOUTH DAILY 90 tablet 1   BD PEN NEEDLE MICRO U/F 32G X 6 MM MISC USE 2 PEN NEEDLES DAILY 200 each 3   Continuous Blood Gluc Receiver (FREESTYLE LIBRE 14 DAY READER) DEVI 1 Act by Does not apply route daily. 2 each 5   Continuous Blood Gluc Sensor (FREESTYLE LIBRE 14 DAY SENSOR) MISC USE AS DIRECTED EVERY 14 DAYS 2 each 5   dapagliflozin propanediol (FARXIGA) 10 MG TABS tablet Take 1 tablet (10 mg total) by mouth daily before breakfast. 90 tablet 1   insulin  glargine, 1 Unit Dial, (TOUJEO SOLOSTAR) 300 UNIT/ML Solostar Pen Inject 20 Units into the skin every morning. 4.5 mL 1   metFORMIN (GLUCOPHAGE) 1000 MG tablet Take 1 tablet (1,000 mg total) by mouth 2 (two) times daily with a meal. 180 tablet 3   metoprolol tartrate (LOPRESSOR) 25 MG tablet Take 0.5 tablets (12.5 mg total) by mouth 2 (two) times daily. Patient needs appointment for further refills. 1 st attempt 180 tablet 1   neomycin-polymyxin b-dexamethasone (MAXITROL)  3.5-10000-0.1 SUSP Place 2 drops into both eyes every 6 (six) hours. 5 mL 0   neomycin-polymyxin-hydrocortisone (CORTISPORIN) 3.5-10000-1 ophthalmic suspension Place 3 drops into both eyes 4 (four) times daily. 7.5 mL 0   omeprazole (PRILOSEC) 20 MG capsule TAKE 1 CAPSULE(20 MG) BY MOUTH EVERY EVENING 90 capsule 1   rivaroxaban (XARELTO) 20 MG TABS tablet TAKE 1 TABLET(20 MG) BY MOUTH DAILY WITH SUPPER 90 tablet 1   tadalafil (CIALIS) 20 MG tablet TAKE 1 TABLET BY MOUTH DAILY AS NEEDED FOR E.D. 10 tablet 5   No current facility-administered medications for this visit.    Allergies  Allergen Reactions   Cephalexin Anaphylaxis, Swelling and Other (See Comments)    Swelling in throat   Edarbi [Azilsartan] Diarrhea    Social History   Socioeconomic History   Marital status: Married    Spouse name: Not on file   Number of children: 2   Years of education: Not on file   Highest education level: Not on file  Occupational History   Occupation: Best boy: [employer]  Tobacco Use   Smoking status: Former    Years: 34.00    Types: Cigarettes    Quit date: 2007    Years since quitting: 15.7   Smokeless tobacco: Former    Types: Chew    Quit date: 02/15/2007  Vaping Use   Vaping Use: Never used  Substance and Sexual Activity   Alcohol use: Yes    Comment: 3 glasses of wine per week    Drug use: No   Sexual activity: Yes    Partners: Male  Other Topics Concern   Not on file  Social History Narrative   Regular exercise-yes   Social Determinants of Health   Financial Resource Strain: Not on file  Food Insecurity: Not on file  Transportation Needs: Not on file  Physical Activity: Not on file  Stress: Not on file  Social Connections: Not on file  Intimate Partner Violence: Not on file     Review of Systems: All other systems reviewed and are otherwise negative except as noted above.  Physical Exam: Vitals:   11/12/20 0817  BP: 118/70  Pulse:  (!) 59  SpO2: 98%  Weight: 177 lb (80.3 kg)  Height: 5\' 8"  (1.727 m)    GEN- The patient is well appearing, alert and oriented x 3 today.   HEENT: normocephalic, atraumatic; sclera clear, conjunctiva pink; hearing intact; oropharynx clear; neck supple, no JVP Lymph- no cervical lymphadenopathy Lungs- Clear to ausculation bilaterally, normal work of breathing.  No wheezes, rales, rhonchi Heart- Regular rate and rhythm, no murmurs, rubs or gallops, PMI not laterally displaced GI- soft, non-tender, non-distended, bowel sounds present, no hepatosplenomegaly Extremities- no clubbing, cyanosis, or edema; DP/PT/radial pulses 2+ bilaterally MS- no significant deformity or atrophy Skin- warm and dry, no rash or lesion Psych- euthymic mood, full affect Neuro- strength and sensation are intact  EKG is ordered. Personal review of EKG from today shows sinus  bradycardia at 59 bpm  Additional studies reviewed include: Previous EP office notes.   Assessment and Plan:  1. Persistent atrial fibrillation EKG shows NSR with stable intervals Continue flecainide. Labs stable 08/2020 Continue Xarelto for CHA2DS2-VASc of at least 4  2. HLD Per PCP  3. ? Chronic systolic CHF TEE 07/5991 with EF 60-65% -> TEE with ablation 06/2019 EF 35-40% Very possible his EF has normalized now in NSR He is not on GDMT. Update Echo. If EF down will need gen cards follow up for GDMT. If EF normalize no change.  4. Cardiac Clearance for umbilical hernia surgery Continue Xarelto for CHA2DS2-VASc of at least 4. Pt can hold Xarelto for 2 days prior to procedure per office protocol He is clear to proceed at low to moderate risk by Revised Cardiac Risk Index [place] Criteria).   He has a ~10% risk of peri-operative cardiac complications if EF normal, elevates to ~15% if EF is down. Either way, in shared decision making I and the patient feel this is acceptable to proceed.   RTC 6 months. Sooner with gen cards if EF abnormal.    Shirley Friar, PA-C  11/12/20 8:38 AM

## 2020-11-12 ENCOUNTER — Ambulatory Visit (INDEPENDENT_AMBULATORY_CARE_PROVIDER_SITE_OTHER): Payer: 59 | Admitting: Student

## 2020-11-12 ENCOUNTER — Other Ambulatory Visit: Payer: Self-pay

## 2020-11-12 ENCOUNTER — Encounter: Payer: Self-pay | Admitting: Student

## 2020-11-12 VITALS — BP 118/70 | HR 59 | Ht 68.0 in | Wt 177.0 lb

## 2020-11-12 DIAGNOSIS — Z01810 Encounter for preprocedural cardiovascular examination: Secondary | ICD-10-CM

## 2020-11-12 DIAGNOSIS — E785 Hyperlipidemia, unspecified: Secondary | ICD-10-CM | POA: Diagnosis not present

## 2020-11-12 DIAGNOSIS — Z01818 Encounter for other preprocedural examination: Secondary | ICD-10-CM

## 2020-11-12 DIAGNOSIS — I4819 Other persistent atrial fibrillation: Secondary | ICD-10-CM

## 2020-11-12 DIAGNOSIS — D6869 Other thrombophilia: Secondary | ICD-10-CM | POA: Diagnosis not present

## 2020-11-12 NOTE — Patient Instructions (Signed)
Medication Instructions:  Your physician recommends that you continue on your current medications as directed. Please refer to the Current Medication list given to you today.  *If you need a refill on your cardiac medications before your next appointment, please call your pharmacy*   Lab Work: None If you have labs (blood work) drawn today and your tests are completely normal, you will receive your results only by: [place] (if you have MyChart) OR A paper copy in the mail If you have any lab test that is abnormal or we need to change your treatment, we will call you to review the results.   Testing/Procedures: Your physician has requested that you have an echocardiogram. Echocardiography is a painless test that uses sound waves to create images of your heart. It provides your doctor with information about the size and shape of your heart and how well your heart's chambers and valves are working. This procedure takes approximately one hour. There are no restrictions for this procedure.    Follow-Up: At [facility], you and your health needs are our priority.  As part of our continuing mission to provide you with exceptional heart care, we have created designated Provider Care Teams.  These Care Teams include your primary Cardiologist (physician) and Advanced Practice Providers (APPs -  Physician Assistants and Nurse Practitioners) who all work together to provide you with the care you need, when you need it.    Your next appointment:   6 month(s)  The format for your next appointment:   In Person  Provider:   You may see Will Meredith Leeds, MD or one of the following Advanced Practice Providers on your designated Care Team:   Tommye Standard, [place] Legrand Como "[facility]" [place], [place]

## 2020-11-25 NOTE — Progress Notes (Signed)
Please enter orders for surgery scheduled for 12-08-20.

## 2020-11-29 ENCOUNTER — Other Ambulatory Visit: Payer: Self-pay | Admitting: Internal Medicine

## 2020-11-29 DIAGNOSIS — F900 Attention-deficit hyperactivity disorder, predominantly inattentive type: Secondary | ICD-10-CM

## 2020-11-30 ENCOUNTER — Other Ambulatory Visit: Payer: Self-pay

## 2020-11-30 ENCOUNTER — Ambulatory Visit (HOSPITAL_COMMUNITY): Payer: 59 | Attending: Internal Medicine

## 2020-11-30 DIAGNOSIS — E785 Hyperlipidemia, unspecified: Secondary | ICD-10-CM | POA: Insufficient documentation

## 2020-11-30 DIAGNOSIS — D6869 Other thrombophilia: Secondary | ICD-10-CM | POA: Insufficient documentation

## 2020-11-30 DIAGNOSIS — I4819 Other persistent atrial fibrillation: Secondary | ICD-10-CM | POA: Diagnosis not present

## 2020-11-30 DIAGNOSIS — Z01810 Encounter for preprocedural cardiovascular examination: Secondary | ICD-10-CM

## 2020-11-30 DIAGNOSIS — Z01818 Encounter for other preprocedural examination: Secondary | ICD-10-CM

## 2020-11-30 LAB — ECHOCARDIOGRAM COMPLETE
Area-P 1/2: 3.91 cm2
S' Lateral: 3.8 cm

## 2020-11-30 MED ORDER — AMPHETAMINE-DEXTROAMPHET ER 30 MG PO CP24
30.0000 mg | ORAL_CAPSULE | ORAL | 0 refills | Status: DC
Start: 2020-11-30 — End: 2020-12-30

## 2020-11-30 MED ORDER — AMPHETAMINE-DEXTROAMPHETAMINE 20 MG PO TABS: 20.0000 mg | ORAL_TABLET | Freq: Every day | ORAL | 0 refills | Status: DC | PRN

## 2020-11-30 NOTE — Progress Notes (Addendum)
COVID swab appointment:n/a  COVID Vaccine Completed: yes x4 Date COVID Vaccine completed: 04/24/19, 05/22/19 Has received booster: 12/02/19, 08/05/20 COVID vaccine manufacturer:   Moderna    Date of COVID positive in last 90 days: no  PCP - Scarlette Calico, MD Electrophysiologist- Allegra Lai, MD  Cardiac clearance 11/12/20 by Barrington Ellison in [place]  Chest x-ray - n/a EKG - 11/12/20 Epic Stress Test - 08/30/18 Epic ECHO - 11/30/20 Epic Cardiac Cath - 06/19/19 Epic Pacemaker/ICD device last checked: n/a Spinal Cord Stimulator: n/a  Sleep Study - n/a CPAP -   Fasting Blood Sugar - 110-120 Checks Blood Sugar _1-2_ times a day Free style libre  Blood Thinner Instructions: Xarelto, hold 2 days Aspirin Instructions: Last Dose:  Activity level: Can go up a flight of stairs and perform activities of daily living without stopping and without symptoms of chest pain or shortness of breath.       Anesthesia review: HTN, a fib, DM 2, liver disease  Patient denies shortness of breath, fever, cough and chest pain at PAT appointment   Patient verbalized understanding of instructions that were given to them at the PAT appointment. Patient was also instructed that they will need to review over the PAT instructions again at home before surgery.

## 2020-11-30 NOTE — Patient Instructions (Addendum)
DUE TO COVID-19 ONLY ONE VISITOR IS ALLOWED TO COME WITH YOU AND STAY IN THE WAITING ROOM ONLY DURING PRE OP AND PROCEDURE.   **NO VISITORS ARE ALLOWED IN THE SHORT STAY AREA OR RECOVERY ROOM!!**   Your procedure is scheduled on: 12/08/20   Report to [facility] Main Entrance    Report to admitting at 11:15 AM   Call this number if you have problems the morning of surgery [phone]   Do not eat food :After Midnight.   May have liquids until 10:30 AM day of surgery  CLEAR LIQUID DIET  Foods Allowed                                                                     Foods Excluded  Water, Black Coffee and tea (no milk or creamer)           liquids that you cannot  Plain Jell-O in any flavor  (No red)                                    see through such as: Fruit ices (not with fruit pulp)                                            milk, soups, orange juice              Iced Popsicles (No red)                                                All solid food                                   Apple juices Sports drinks like Gatorade (No red) Lightly seasoned clear broth or consume(fat free) Sugar     Oral Hygiene is also important to reduce your risk of infection.                                    Remember - BRUSH YOUR TEETH THE MORNING OF SURGERY WITH YOUR REGULAR TOOTHPASTE   Take these medicines the morning of surgery with A SIP OF WATER: Lipitor, Metoprolol, Afluzosin   DO NOT TAKE ANY ORAL DIABETIC MEDICATIONS DAY OF YOUR SURGERY  How to Manage Your Diabetes Before and After Surgery  Why is it important to control my blood sugar before and after surgery? Improving blood sugar levels before and after surgery helps healing and can limit problems. A way of improving blood sugar control is eating a healthy diet by:  Eating less sugar and carbohydrates  Increasing activity/exercise  Talking with your doctor about reaching your blood sugar goals High blood sugars  (greater than 180 mg/dL) can raise your risk of infections and slow your recovery, so you  will need to focus on controlling your diabetes during the weeks before surgery. Make sure that the doctor who takes care of your diabetes knows about your planned surgery including the date and location.  How do I manage my blood sugar before surgery? Check your blood sugar at least 4 times a day, starting 2 days before surgery, to make sure that the level is not too high or low. Check your blood sugar the morning of your surgery when you wake up and every 2 hours until you get to the Short Stay unit. If your blood sugar is less than 70 mg/dL, you will need to treat for low blood sugar: Do not take insulin. Treat a low blood sugar (less than 70 mg/dL) with  cup of clear juice (cranberry or apple), 4 glucose tablets, OR glucose gel. Recheck blood sugar in 15 minutes after treatment (to make sure it is greater than 70 mg/dL). If your blood sugar is not greater than 70 mg/dL on recheck, call [phone] for further instructions. Report your blood sugar to the short stay nurse when you get to Short Stay.  If you are admitted to the hospital after surgery: Your blood sugar will be checked by the staff and you will probably be given insulin after surgery (instead of oral diabetes medicines) to make sure you have good blood sugar levels. The goal for blood sugar control after surgery is 80-180 mg/dL.   WHAT DO I DO ABOUT MY DIABETES MEDICATION?  Do not take oral diabetes medicines (pills) the morning of surgery.  THE DAY BEFORE SURGERY, take Metformin as prescribed. Do not take [place].  Take insulin glargine as prescribed.      THE MORNING OF SURGERY, take 10 units of insulin glargine. Do not take Metformin or Farxiga.    Reviewed and Endorsed by [facility] Patient Education Committee, August 2015                               You may not have any metal on your body including jewelry, and body  piercing             Do not wear lotions, powders, cologne, or deodorant              Men may shave face and neck.   Do not bring valuables to the hospital. [place].   Contacts, dentures or bridgework may not be worn into surgery.    Patients discharged on the day of surgery will not be allowed to drive home.  Special Instructions: Bring a copy of your healthcare power of attorney and living will documents         the day of surgery if you haven't scanned them before.   Please read over the following fact sheets you were given: IF YOU HAVE QUESTIONS ABOUT YOUR PRE-OP INSTRUCTIONS PLEASE CALL [place] - Preparing for Surgery Before surgery, you can play an important role.  Because skin is not sterile, your skin needs to be as free of germs as possible.  You can reduce the number of germs on your skin by washing with CHG (chlorahexidine gluconate) soap before surgery.  CHG is an antiseptic cleaner which kills germs and bonds with the skin to continue killing germs even after washing. Please DO NOT use if  you have an allergy to CHG or antibacterial soaps.  If your skin becomes reddened/irritated stop using the CHG and inform your nurse when you arrive at Short Stay. Do not shave (including legs and underarms) for at least 48 hours prior to the first CHG shower.  You may shave your face/neck.  Please follow these instructions carefully:  1.  Shower with CHG Soap the night before surgery and the  morning of surgery.  2.  If you choose to wash your hair, wash your hair first as usual with your normal  shampoo.  3.  After you shampoo, rinse your hair and body thoroughly to remove the shampoo.                             4.  Use CHG as you would any other liquid soap.  You can apply chg directly to the skin and wash.  Gently with a scrungie or clean washcloth.  5.  Apply the CHG Soap to your body ONLY FROM THE NECK DOWN.    Do   not use on face/ open                           Wound or open sores. Avoid contact with eyes, ears mouth and   genitals (private parts).                       Wash face,  Genitals (private parts) with your normal soap.             6.  Wash thoroughly, paying special attention to the area where your    surgery  will be performed.  7.  Thoroughly rinse your body with warm water from the neck down.  8.  DO NOT shower/wash with your normal soap after using and rinsing off the CHG Soap.                9.  Pat yourself dry with a clean towel.            10.  Wear clean pajamas.            11.  Place clean sheets on your bed the night of your first shower and do not  sleep with pets. Day of Surgery : Do not apply any lotions/deodorants the morning of surgery.  Please wear clean clothes to the hospital/surgery center.  FAILURE TO FOLLOW THESE INSTRUCTIONS MAY RESULT IN THE CANCELLATION OF YOUR SURGERY  PATIENT SIGNATURE_________________________________  NURSE SIGNATURE__________________________________  ________________________________________________________________________

## 2020-12-01 ENCOUNTER — Encounter (HOSPITAL_COMMUNITY): Payer: Self-pay

## 2020-12-01 ENCOUNTER — Encounter (HOSPITAL_COMMUNITY)
Admission: RE | Admit: 2020-12-01 | Discharge: 2020-12-01 | Disposition: A | Discharge status: Home / Self Care | Payer: 59 | Source: Ambulatory Visit | Attending: General Surgery | Admitting: General Surgery

## 2020-12-01 DIAGNOSIS — E119 Type 2 diabetes mellitus without complications: Secondary | ICD-10-CM | POA: Diagnosis not present

## 2020-12-01 DIAGNOSIS — I4891 Unspecified atrial fibrillation: Secondary | ICD-10-CM | POA: Insufficient documentation

## 2020-12-01 DIAGNOSIS — Z794 Long term (current) use of insulin: Secondary | ICD-10-CM | POA: Insufficient documentation

## 2020-12-01 DIAGNOSIS — Z7984 Long term (current) use of oral hypoglycemic drugs: Secondary | ICD-10-CM | POA: Diagnosis not present

## 2020-12-01 DIAGNOSIS — Z87891 Personal history of nicotine dependence: Secondary | ICD-10-CM | POA: Insufficient documentation

## 2020-12-01 DIAGNOSIS — K429 Umbilical hernia without obstruction or gangrene: Secondary | ICD-10-CM | POA: Diagnosis not present

## 2020-12-01 DIAGNOSIS — Z7901 Long term (current) use of anticoagulants: Secondary | ICD-10-CM | POA: Diagnosis not present

## 2020-12-01 DIAGNOSIS — Z01812 Encounter for preprocedural laboratory examination: Secondary | ICD-10-CM | POA: Diagnosis not present

## 2020-12-01 DIAGNOSIS — K219 Gastro-esophageal reflux disease without esophagitis: Secondary | ICD-10-CM | POA: Diagnosis not present

## 2020-12-01 DIAGNOSIS — Z79899 Other long term (current) drug therapy: Secondary | ICD-10-CM | POA: Insufficient documentation

## 2020-12-01 LAB — COMPREHENSIVE METABOLIC PANEL
ALT: 21 U/L (ref 0–44)
AST: 21 U/L (ref 15–41)
Albumin: 4.2 g/dL (ref 3.5–5.0)
Alkaline Phosphatase: 48 U/L (ref 38–126)
Anion gap: 7 (ref 5–15)
BUN: 19 mg/dL (ref 8–23)
CO2: 24 mmol/L (ref 22–32)
Calcium: 9.1 mg/dL (ref 8.9–10.3)
Chloride: 106 mmol/L (ref 98–111)
Creatinine, Ser: 0.9 mg/dL (ref 0.61–1.24)
GFR, Estimated: >60 mL/min (ref >60)
Glucose, Bld: 139 mg/dL — ABNORMAL HIGH (ref 70–99)
Potassium: 4 mmol/L (ref 3.5–5.1)
Sodium: 137 mmol/L (ref 135–145)
Total Bilirubin: 1.4 mg/dL — ABNORMAL HIGH (ref 0.3–1.2)
Total Protein: 6.7 g/dL (ref 6.5–8.1)

## 2020-12-01 LAB — CBC
HCT: 45.4 % (ref 39.0–52.0)
Hemoglobin: 15.2 g/dL (ref 13.0–17.0)
MCH: 29.5 pg (ref 26.0–34.0)
MCHC: 33.5 g/dL (ref 30.0–36.0)
MCV: 88 fL (ref 80.0–100.0)
Platelets: 156 10*3/uL (ref 150–400)
RBC: 5.16 MIL/uL (ref 4.22–5.81)
RDW: 12.9 % (ref 11.5–15.5)
WBC: 4.7 10*3/uL (ref 4.0–10.5)
nRBC: 0 % (ref 0.0–0.2)

## 2020-12-01 LAB — HEMOGLOBIN A1C
Hgb A1c MFr Bld: 6.4 % — ABNORMAL HIGH (ref 4.8–5.6)
Mean Plasma Glucose: 136.98 mg/dL

## 2020-12-01 LAB — GLUCOSE, CAPILLARY: Glucose-Capillary: 137 mg/dL — ABNORMAL HIGH (ref 70–99)

## 2020-12-02 NOTE — Anesthesia Preprocedure Evaluation (Addendum)
Anesthesia Evaluation    Reviewed: Allergy & Precautions, Patient's Chart, lab work & pertinent test results, Unable to perform ROS - Chart review only  Airway Mallampati: II  TM Distance: >3 FB Neck ROM: Full    Dental  (+) Edentulous Upper, Dental Advisory Given, Chipped   Pulmonary shortness of breath, former smoker,    Pulmonary exam normal breath sounds clear to auscultation       Cardiovascular hypertension, Pt. on home beta blockers and Pt. on medications + CAD  + dysrhythmias Atrial Fibrillation  Rhythm:Regular Rate:Normal  Echo 11/30/2020 1. Left ventricular ejection fraction by 3D volume is 60 %. The left ventricle has normal function. The left ventricle has no regional wall motion abnormalities. Left ventricular diastolic parameters were normal.  2. Right ventricular systolic function is normal. The right ventricular size is normal. There is normal pulmonary artery systolic pressure. The estimated right ventricular systolic pressure is 95.6 mmHg.  3. The mitral valve is normal in structure. Trivial mitral valve regurgitation. No evidence of mitral stenosis.  4. The aortic valve is tricuspid. Aortic valve regurgitation is not visualized. No aortic stenosis is present.  5. The inferior vena cava is normal in size with greater than 50% respiratory variability, suggesting right atrial pressure of 3 mmHg.    Neuro/Psych PSYCHIATRIC DISORDERS negative neurological ROS     GI/Hepatic GERD  ,(+) Hepatitis -  Endo/Other  diabetes  Renal/GU negative Renal ROS     Musculoskeletal negative musculoskeletal ROS (+)   Abdominal   Peds  Hematology  (+) Blood dyscrasia, anemia ,   Anesthesia Other Findings   Reproductive/Obstetrics                          Anesthesia Physical Anesthesia Plan  ASA: 3  Anesthesia Plan: General   Post-op Pain Management:    Induction: Intravenous  PONV Risk  Score and Plan: 4 or greater and Ondansetron, Dexamethasone, Treatment may vary due to age or medical condition and Midazolam  Airway Management Planned: Oral ETT  Additional Equipment: None  Intra-op Plan:   Post-operative Plan: Extubation in OR  Informed Consent: I have reviewed the patients History and Physical, chart, labs and discussed the procedure including the risks, benefits and alternatives for the proposed anesthesia with the patient or authorized representative who has indicated his/her understanding and acceptance.     Dental advisory given  Plan Discussed with: CRNA  Anesthesia Plan Comments: (See PAT note 12/01/2020, Konrad Felix Ward, PA-C)      Anesthesia Quick Evaluation

## 2020-12-02 NOTE — Progress Notes (Signed)
Anesthesia Chart Review   Case: [id number] Date/Time: 12/08/20 1315   Procedure: HERNIA REPAIR UMBILICAL   Anesthesia type: General   Pre-op diagnosis: UMBILICAL HERNIA   Location: [place] [facility] / [facility]   Surgeons: Kinsinger, Arta Bruce, MD       DISCUSSION:65 y.o. former smoker with h/o DM II, GERD, Hepatitis C treated, a-fib (on Flecainide, Xarelto), umbilical hernia scheduled for above procedure 12/08/2020 with Dr. Gurney Maxin.   Pt last seen by cardiology 11/12/2020. Per OV note, "Continue Xarelto for CHA2DS2-VASc of at least 4. Pt can hold Xarelto for 2 days prior to procedure per office protocol He is clear to proceed at low to moderate risk by Revised Cardiac Risk Index [place] Criteria).   He has a ~10% risk of peri-operative cardiac complications if EF normal, elevates to ~15% if EF is down. Either way, in shared decision making I and the patient feel this is acceptable to proceed."  Anticipate pt can proceed with planned procedure barring acute status change.   VS: BP 130/80   Pulse (!) 59   Temp 36.7 C (Oral)   Resp 18   Ht 5\' 8"  (1.727 m)   Wt 79.3 kg   SpO2 99%   BMI 26.58 kg/m   PROVIDERS: Janith Lima, MD is PCP   Allegra Lai, MD is Cardiologist  LABS: Labs reviewed: Acceptable for surgery. (all labs ordered are listed, but only abnormal results are displayed)  Labs Reviewed  HEMOGLOBIN A1C - Abnormal; Notable for the following components:      Result Value   Hgb A1c MFr Bld 6.4 (*)    All other components within normal limits  COMPREHENSIVE METABOLIC PANEL - Abnormal; Notable for the following components:   Glucose, Bld 139 (*)    Total Bilirubin 1.4 (*)    All other components within normal limits  GLUCOSE, CAPILLARY - Abnormal; Notable for the following components:   Glucose-Capillary 137 (*)    All other components within normal limits  CBC     IMAGES:   EKG: 11/12/2020 Rate 59 bpm  Sinus bradycardia  CV: Echo 11/30/2020  1. Left  ventricular ejection fraction by 3D volume is 60 %. The left  ventricle has normal function. The left ventricle has no regional wall  motion abnormalities. Left ventricular diastolic parameters were normal.   2. Right ventricular systolic function is normal. The right ventricular  size is normal. There is normal pulmonary artery systolic pressure. The  estimated right ventricular systolic pressure is 46.6 mmHg.   3. The mitral valve is normal in structure. Trivial mitral valve  regurgitation. No evidence of mitral stenosis.   4. The aortic valve is tricuspid. Aortic valve regurgitation is not  visualized. No aortic stenosis is present.   5. The inferior vena cava is normal in size with greater than 50%  respiratory variability, suggesting right atrial pressure of 3 mmHg.  CT Coronary 03/25/2019 IMPRESSION: 1.  The RCA could not be analyzed due to artifact.   2.  No evidence for hemodynamically significant disease in the LAD.   3. Probably no hemodynamically significant disease in the LCx. The distal LCx FFR is borderline but is in the distal vessel and unlikely to be significant.  Stress Test 08/30/2018 The left ventricular ejection fraction is normal (55-65%). Nuclear stress EF: 62%. No T wave inversion was noted during stress. There was no ST segment deviation noted during stress. This is a low risk study.   Normal perfusion. LVEF 62%  with normal wall motion. This is a low risk study. 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/2009   Basal cell    Past Surgical History:  Procedure Laterality Date   APPENDECTOMY     ATRIAL FIBRILLATION ABLATION N/A 03/27/2019   Procedure: ATRIAL FIBRILLATION ABLATION;  Surgeon: Constance Haw, MD;  Location: [place] [facility];  Service: Cardiovascular;  Laterality: N/A;   ATRIAL FIBRILLATION ABLATION N/A 06/19/2019   Procedure: ATRIAL FIBRILLATION ABLATION;  Surgeon: Constance Haw, MD;  Location: [place] [facility];  Service: Cardiovascular;  Laterality: N/A;   carotid injury       post tonsillectomy, R carotid injury with trach   HERNIA REPAIR  1973   inguinal- R side   KNEE SURGERY Right    LUMBAR LAMINECTOMY/DECOMPRESSION MICRODISCECTOMY Right 09/12/2012   Procedure: LUMBAR LAMINECTOMY/DECOMPRESSION MICRODISCECTOMY 1 LEVEL;  Surgeon: Charlie Pitter, MD;  Location: [facility];  Service: Neurosurgery;  Laterality: Right;  Right lumbar three-four microdiscectomy   ORIF SHOULDER FRACTURE Right 06/02/2017   Procedure: OPEN REDUCTION INTERNAL FIXATION (ORIF) RIGHT GLENOID FRACTURE, POSSIBLE LATARJET CORACOID TRANSFER;  Surgeon: Meredith Pel, MD;  Location: [facility];  Service: Orthopedics;  Laterality: Right;   TEE WITHOUT CARDIOVERSION N/A 06/19/2019   Procedure: TRANSESOPHAGEAL ECHOCARDIOGRAM (TEE);  Surgeon: Thayer Headings, MD;  Location: [facility];  Service: Cardiovascular;  Laterality: N/A;   TONSILLECTOMY     TRACHEOSTOMY CLOSURE  1981    MEDICATIONS:  alfuzosin (UROXATRAL) 10 MG 24 hr tablet   amphetamine-dextroamphetamine (ADDERALL XR) 30 MG 24 hr capsule   amphetamine-dextroamphetamine (ADDERALL) 20 MG tablet   atorvastatin (LIPITOR) 10 MG tablet   BD PEN NEEDLE MICRO U/F 32G X 6 MM MISC   Continuous Blood Gluc Receiver (FREESTYLE LIBRE 14 DAY READER) DEVI   Continuous Blood Gluc Sensor (FREESTYLE LIBRE 14 DAY SENSOR) MISC   dapagliflozin propanediol (FARXIGA) 10 MG TABS tablet   insulin glargine, 1 Unit Dial, (TOUJEO SOLOSTAR)  300 UNIT/ML Solostar Pen   metFORMIN (GLUCOPHAGE) 1000 MG tablet   metoprolol tartrate (LOPRESSOR) 25 MG tablet   neomycin-polymyxin b-dexamethasone (MAXITROL) 3.5-10000-0.1 SUSP   neomycin-polymyxin-hydrocortisone (CORTISPORIN) 3.5-10000-1 ophthalmic suspension   omeprazole (PRILOSEC) 20 MG capsule   rivaroxaban (XARELTO) 20 MG TABS tablet   tadalafil (CIALIS) 20 MG tablet   No current facility-administered medications for this encounter.     Konrad Felix Ward, PA-C [facility] [phone]

## 2020-12-08 ENCOUNTER — Encounter (HOSPITAL_COMMUNITY)
Admission: RE | Disposition: A | Discharge status: Home / Self Care | Payer: Self-pay | Source: Home / Self Care | Attending: General Surgery

## 2020-12-08 ENCOUNTER — Ambulatory Visit (HOSPITAL_COMMUNITY): Payer: 59 | Admitting: Physician Assistant

## 2020-12-08 ENCOUNTER — Ambulatory Visit (HOSPITAL_COMMUNITY)
Admission: RE | Admit: 2020-12-08 | Discharge: 2020-12-08 | Disposition: A | Discharge status: Home / Self Care | Payer: 59 | Attending: General Surgery | Admitting: General Surgery

## 2020-12-08 ENCOUNTER — Ambulatory Visit (HOSPITAL_COMMUNITY): Payer: 59 | Admitting: Anesthesiology

## 2020-12-08 ENCOUNTER — Encounter (HOSPITAL_COMMUNITY): Payer: Self-pay | Admitting: General Surgery

## 2020-12-08 DIAGNOSIS — Z87891 Personal history of nicotine dependence: Secondary | ICD-10-CM | POA: Diagnosis not present

## 2020-12-08 DIAGNOSIS — K429 Umbilical hernia without obstruction or gangrene: Secondary | ICD-10-CM | POA: Diagnosis present

## 2020-12-08 DIAGNOSIS — I480 Paroxysmal atrial fibrillation: Secondary | ICD-10-CM | POA: Diagnosis not present

## 2020-12-08 HISTORY — PX: UMBILICAL HERNIA REPAIR: SHX196

## 2020-12-08 LAB — PROTIME-INR
INR: 1 (ref 0.8–1.2)
Prothrombin Time: 13.1 seconds (ref 11.4–15.2)

## 2020-12-08 LAB — GLUCOSE, CAPILLARY: Glucose-Capillary: 154 mg/dL — ABNORMAL HIGH (ref 70–99)

## 2020-12-08 LAB — APTT: aPTT: 27 seconds (ref 24–36)

## 2020-12-08 SURGERY — REPAIR, HERNIA, UMBILICAL, ADULT
Anesthesia: General | Site: Abdomen

## 2020-12-08 MED ORDER — 0.9 % SODIUM CHLORIDE (POUR BTL) OPTIME
TOPICAL | Status: DC | PRN
  Administered 2020-12-08: 1000 mL

## 2020-12-08 MED ORDER — DEXAMETHASONE SODIUM PHOSPHATE 4 MG/ML IJ SOLN
INTRAMUSCULAR | Status: DC | PRN
  Administered 2020-12-08: 5 mg via INTRAVENOUS

## 2020-12-08 MED ORDER — ONDANSETRON HCL 4 MG/2ML IJ SOLN
INTRAMUSCULAR | Status: AC
  Filled 2020-12-08: qty 2

## 2020-12-08 MED ORDER — IBUPROFEN 800 MG PO TABS: 800.0000 mg | ORAL_TABLET | Freq: Three times a day (TID) | ORAL | 0 refills | Status: DC | PRN

## 2020-12-08 MED ORDER — FENTANYL CITRATE (PF) 100 MCG/2ML IJ SOLN
INTRAMUSCULAR | Status: DC | PRN
  Administered 2020-12-08: 50 ug via INTRAVENOUS
  Administered 2020-12-08: 100 ug via INTRAVENOUS
  Administered 2020-12-08: 50 ug via INTRAVENOUS

## 2020-12-08 MED ORDER — PROPOFOL 10 MG/ML IV BOLUS
INTRAVENOUS | Status: AC
  Filled 2020-12-08: qty 20

## 2020-12-08 MED ORDER — EPHEDRINE SULFATE-NACL 50-0.9 MG/10ML-% IV SOSY
PREFILLED_SYRINGE | INTRAVENOUS | Status: DC | PRN
  Administered 2020-12-08: 5 mg via INTRAVENOUS

## 2020-12-08 MED ORDER — MEPERIDINE HCL 50 MG/ML IJ SOLN: 6.2500 mg | INTRAMUSCULAR | Status: DC | PRN

## 2020-12-08 MED ORDER — CHLORHEXIDINE GLUCONATE CLOTH 2 % EX PADS: 6.0000 | MEDICATED_PAD | Freq: Once | CUTANEOUS | Status: DC

## 2020-12-08 MED ORDER — HYDROMORPHONE HCL 1 MG/ML IJ SOLN: 0.2500 mg | INTRAMUSCULAR | Status: DC | PRN

## 2020-12-08 MED ORDER — BUPIVACAINE-EPINEPHRINE (PF) 0.5% -1:200000 IJ SOLN
INTRAMUSCULAR | Status: DC | PRN
  Administered 2020-12-08: 30 mL

## 2020-12-08 MED ORDER — BUPIVACAINE-EPINEPHRINE (PF) 0.5% -1:200000 IJ SOLN
INTRAMUSCULAR | Status: AC
  Filled 2020-12-08: qty 30

## 2020-12-08 MED ORDER — PROPOFOL 10 MG/ML IV BOLUS
INTRAVENOUS | Status: DC | PRN
  Administered 2020-12-08: 110 mg via INTRAVENOUS
  Administered 2020-12-08: 40 mg via INTRAVENOUS

## 2020-12-08 MED ORDER — DEXAMETHASONE SODIUM PHOSPHATE 10 MG/ML IJ SOLN
INTRAMUSCULAR | Status: AC
  Filled 2020-12-08: qty 1

## 2020-12-08 MED ORDER — PHENYLEPHRINE 40 MCG/ML (10ML) SYRINGE FOR IV PUSH (FOR BLOOD PRESSURE SUPPORT)
PREFILLED_SYRINGE | INTRAVENOUS | Status: AC
  Filled 2020-12-08: qty 10

## 2020-12-08 MED ORDER — ESMOLOL HCL 100 MG/10ML IV SOLN
INTRAVENOUS | Status: DC | PRN
  Administered 2020-12-08: 30 mg via INTRAVENOUS
  Administered 2020-12-08: 20 mg via INTRAVENOUS

## 2020-12-08 MED ORDER — METOPROLOL TARTRATE 5 MG/5ML IV SOLN
INTRAVENOUS | Status: DC | PRN
Start: 2020-12-08 — End: 2020-12-08
  Administered 2020-12-08: 2 mg via INTRAVENOUS

## 2020-12-08 MED ORDER — FENTANYL CITRATE (PF) 100 MCG/2ML IJ SOLN
INTRAMUSCULAR | Status: AC
  Filled 2020-12-08: qty 2

## 2020-12-08 MED ORDER — ROCURONIUM BROMIDE 10 MG/ML (PF) SYRINGE
PREFILLED_SYRINGE | INTRAVENOUS | Status: AC
  Filled 2020-12-08: qty 10

## 2020-12-08 MED ORDER — PHENYLEPHRINE 40 MCG/ML (10ML) SYRINGE FOR IV PUSH (FOR BLOOD PRESSURE SUPPORT)
PREFILLED_SYRINGE | INTRAVENOUS | Status: DC | PRN
  Administered 2020-12-08 (×5): 80 ug via INTRAVENOUS
  Administered 2020-12-08: 120 ug via INTRAVENOUS
  Administered 2020-12-08: 80 ug via INTRAVENOUS

## 2020-12-08 MED ORDER — SUGAMMADEX SODIUM 200 MG/2ML IV SOLN
INTRAVENOUS | Status: DC | PRN
  Administered 2020-12-08: 200 mg via INTRAVENOUS

## 2020-12-08 MED ORDER — MIDAZOLAM HCL 2 MG/2ML IJ SOLN
INTRAMUSCULAR | Status: AC
  Filled 2020-12-08: qty 2

## 2020-12-08 MED ORDER — LACTATED RINGERS IV SOLN: INTRAVENOUS | Status: DC

## 2020-12-08 MED ORDER — MIDAZOLAM HCL 2 MG/2ML IJ SOLN
INTRAMUSCULAR | Status: DC | PRN
  Administered 2020-12-08: 2 mg via INTRAVENOUS

## 2020-12-08 MED ORDER — LIDOCAINE 2% (20 MG/ML) 5 ML SYRINGE
INTRAMUSCULAR | Status: DC | PRN
  Administered 2020-12-08: 60 mg via INTRAVENOUS

## 2020-12-08 MED ORDER — EPHEDRINE 5 MG/ML INJ
INTRAVENOUS | Status: AC
  Filled 2020-12-08: qty 5

## 2020-12-08 MED ORDER — METOPROLOL TARTRATE 5 MG/5ML IV SOLN
INTRAVENOUS | Status: AC
  Filled 2020-12-08: qty 5

## 2020-12-08 MED ORDER — ACETAMINOPHEN 500 MG PO TABS
1000.0000 mg | ORAL_TABLET | Freq: Once | ORAL | Status: AC
  Administered 2020-12-08: 1000 mg via ORAL
  Filled 2020-12-08: qty 2

## 2020-12-08 MED ORDER — ORAL CARE MOUTH RINSE
15.0000 mL | Freq: Once | OROMUCOSAL | Status: AC
Start: 2020-12-08 — End: 2020-12-08

## 2020-12-08 MED ORDER — ESMOLOL HCL 100 MG/10ML IV SOLN
INTRAVENOUS | Status: AC
  Filled 2020-12-08: qty 10

## 2020-12-08 MED ORDER — LIDOCAINE HCL (PF) 2 % IJ SOLN
INTRAMUSCULAR | Status: AC
  Filled 2020-12-08: qty 5

## 2020-12-08 MED ORDER — ONDANSETRON HCL 4 MG/2ML IJ SOLN
INTRAMUSCULAR | Status: DC | PRN
  Administered 2020-12-08: 4 mg via INTRAVENOUS

## 2020-12-08 MED ORDER — OXYCODONE HCL 5 MG PO TABS: 5.0000 mg | ORAL_TABLET | Freq: Four times a day (QID) | ORAL | 0 refills | Status: DC | PRN

## 2020-12-08 MED ORDER — CHLORHEXIDINE GLUCONATE 0.12 % MT SOLN
15.0000 mL | Freq: Once | OROMUCOSAL | Status: AC
  Administered 2020-12-08: 15 mL via OROMUCOSAL

## 2020-12-08 MED ORDER — PROMETHAZINE HCL 25 MG/ML IJ SOLN: 6.2500 mg | INTRAMUSCULAR | Status: DC | PRN

## 2020-12-08 MED ORDER — CLINDAMYCIN PHOSPHATE 900 MG/50ML IV SOLN
900.0000 mg | INTRAVENOUS | Status: AC
  Administered 2020-12-08: 900 mg via INTRAVENOUS
  Filled 2020-12-08: qty 50

## 2020-12-08 MED ORDER — ROCURONIUM BROMIDE 10 MG/ML (PF) SYRINGE
PREFILLED_SYRINGE | INTRAVENOUS | Status: DC | PRN
  Administered 2020-12-08: 20 mg via INTRAVENOUS
  Administered 2020-12-08: 50 mg via INTRAVENOUS

## 2020-12-08 SURGICAL SUPPLY — 29 items
ADH SKN CLS APL DERMABOND .7 (GAUZE/BANDAGES/DRESSINGS) ×1
APL PRP STRL LF DISP 70% ISPRP (MISCELLANEOUS) ×1
BAG COUNTER SPONGE SURGICOUNT (BAG) IMPLANT
BAG SPNG CNTER NS LX DISP (BAG)
CHLORAPREP W/TINT 26 (MISCELLANEOUS) ×2 IMPLANT
COVER SURGICAL LIGHT HANDLE (MISCELLANEOUS) ×2 IMPLANT
DECANTER SPIKE VIAL GLASS SM (MISCELLANEOUS) ×2 IMPLANT
DERMABOND ADVANCED (GAUZE/BANDAGES/DRESSINGS) ×1
DERMABOND ADVANCED .7 DNX12 (GAUZE/BANDAGES/DRESSINGS) ×1 IMPLANT
DRAPE LAPAROSCOPIC ABDOMINAL (DRAPES) ×2 IMPLANT
ELECT REM PT RETURN 15FT ADLT (MISCELLANEOUS) ×2 IMPLANT
GLOVE SURG POLYISO LF SZ7 (GLOVE) ×2 IMPLANT
GLOVE SURG UNDER POLY LF SZ7 (GLOVE) ×2 IMPLANT
GOWN STRL REUS W/TWL LRG LVL3 (GOWN DISPOSABLE) ×2 IMPLANT
GOWN STRL REUS W/TWL XL LVL3 (GOWN DISPOSABLE) ×2 IMPLANT
KIT BASIN OR (CUSTOM PROCEDURE TRAY) ×2 IMPLANT
KIT TURNOVER KIT A (KITS) IMPLANT
MESH BARD SOFT 2X4IN (Mesh General) ×1 IMPLANT
NEEDLE HYPO 22GX1.5 SAFETY (NEEDLE) ×2 IMPLANT
PACK BASIC VI WITH GOWN DISP (CUSTOM PROCEDURE TRAY) ×2 IMPLANT
PENCIL SMOKE EVACUATOR (MISCELLANEOUS) IMPLANT
SPONGE T-LAP 4X18 ~~LOC~~+RFID (SPONGE) ×2 IMPLANT
SUT MNCRL AB 4-0 PS2 18 (SUTURE) ×2 IMPLANT
SUT NOVA NAB GS-21 0 18 T12 DT (SUTURE) IMPLANT
SUT VIC AB 3-0 SH 27 (SUTURE) ×2
SUT VIC AB 3-0 SH 27X BRD (SUTURE) ×1 IMPLANT
SYR CONTROL 10ML LL (SYRINGE) ×2 IMPLANT
TOWEL OR 17X26 10 PK STRL BLUE (TOWEL DISPOSABLE) ×2 IMPLANT
TOWEL OR NON WOVEN STRL DISP B (DISPOSABLE) ×2 IMPLANT

## 2020-12-08 NOTE — Discharge Instructions (Signed)
[facility] _______[facility]  UMBILICAL OR INGUINAL HERNIA REPAIR: POST OP INSTRUCTIONS  Always review your discharge instruction sheet given to you by the facility where your surgery was performed. IF YOU HAVE DISABILITY OR FAMILY LEAVE FORMS, YOU MUST BRING THEM TO THE OFFICE FOR PROCESSING.   DO NOT GIVE THEM TO YOUR DOCTOR.  1. A  prescription for pain medication may be given to you upon discharge.  Take your pain medication as prescribed, if needed.  If narcotic pain medicine is not needed, then you may take acetaminophen (Tylenol) or ibuprofen (Advil) as needed. 2. Take your usually prescribed medications unless otherwise directed. If you need a refill on your pain medication, please contact your pharmacy.  They will contact our office to request authorization. Prescriptions will not be filled after 5 pm or on week-ends. 3. You should follow a light diet the first 24 hours after arrival home, such as soup and crackers, etc.  Be sure to include lots of fluids daily.  Resume your normal diet the day after surgery. 4.Most patients will experience some swelling and bruising around the umbilicus or in the groin and scrotum.  Ice packs and reclining will help.  Swelling and bruising can take several days to resolve.  6. It is common to experience some constipation if taking pain medication after surgery.  Increasing fluid intake and taking a stool softener (such as Colace) will usually help or prevent this problem from occurring.  A mild laxative (Milk of Magnesia or Miralax) should be taken according to package directions if there are no bowel movements after 48 hours. 7. Unless discharge instructions indicate otherwise, you may remove your bandages 24-48 hours after surgery, and you may shower at that time.  You may have steri-strips (small skin tapes) in place directly over the incision.  These strips should be left on the skin for 7-10 days.  If your surgeon used skin glue on the  incision, you may shower in 24 hours.  The glue will flake off over the next 2-3 weeks.  Any sutures or staples will be removed at the office during your follow-up visit. 8. ACTIVITIES:  You may resume regular (light) daily activities beginning the next day--such as daily self-care, walking, climbing stairs--gradually increasing activities as tolerated.  You may have sexual intercourse when it is comfortable.  Refrain from any heavy lifting or straining until approved by your doctor.  a.You may drive when you are no longer taking prescription pain medication, you can comfortably wear a seatbelt, and you can safely maneuver your car and apply brakes. b.RETURN TO WORK:   _____________________________________________  9.You should see your doctor in the office for a follow-up appointment approximately 2-3 weeks after your surgery.  Make sure that you call for this appointment within a day or two after you arrive home to insure a convenient appointment time. 10.OTHER INSTRUCTIONS: _________________________    _____________________________________  WHEN TO CALL YOUR DOCTOR: Fever over 101.0 Inability to urinate Nausea and/or vomiting Extreme swelling or bruising Continued bleeding from incision. Increased pain, redness, or drainage from the incision  The clinic staff is available to answer your questions during regular business hours.  Please don't hesitate to call and ask to speak to one of the nurses for clinical concerns.  If you have a medical emergency, go to the nearest emergency room or call 911.  A surgeon from [facility] is always on call at the hospital   [address], [address],   [place], [place]  [address] ?  [address] [address], , [place]   [address] [phone] ? [phone] ? FAX [phone] Web site: [email or web address]  

## 2020-12-08 NOTE — Anesthesia Procedure Notes (Signed)
Procedure Name: Intubation Date/Time: 12/08/2020 12:55 PM Performed by: Claudia Desanctis, CRNA Pre-anesthesia Checklist: Patient identified, Emergency Drugs available, Suction available and Patient being monitored Patient Re-evaluated:Patient Re-evaluated prior to induction Oxygen Delivery Method: Circle system utilized Preoxygenation: Pre-oxygenation with 100% oxygen Induction Type: IV induction Ventilation: Mask ventilation without difficulty Laryngoscope Size: 2 and Miller Grade View: Grade I Tube type: Oral Tube size: 7.5 mm Number of attempts: 1 Airway Equipment and Method: Stylet Placement Confirmation: ETT inserted through vocal cords under direct vision, positive ETCO2 and breath sounds checked- equal and bilateral Secured at: 21 cm Tube secured with: Tape Dental Injury: Teeth and Oropharynx as per pre-operative assessment

## 2020-12-08 NOTE — Transfer of Care (Signed)
Immediate Anesthesia Transfer of Care Note  Patient: Michael Li  Procedure(s) Performed: HERNIA REPAIR UMBILICAL (Abdomen)  Patient Location: PACU  Anesthesia Type:General  Level of Consciousness: drowsy  Airway & Oxygen Therapy: Patient Spontanous Breathing and Patient connected to face mask  Post-op Assessment: Report given to RN and Post -op Vital signs reviewed and stable  Post vital signs: Reviewed and stable  Last Vitals:  Vitals Value Taken Time  BP 128/74 12/08/20 1415  Temp 36.8 C 12/08/20 1415  Pulse 84 12/08/20 1417  Resp 11 12/08/20 1417  SpO2 95 % 12/08/20 1417  Vitals shown include unvalidated device data.  Last Pain:  Vitals:   12/08/20 1241  TempSrc: Oral  PainSc:          Complications: No notable events documented.

## 2020-12-08 NOTE — Op Note (Signed)
PATIENT:  Michael Li  65 y.o. male  PRE-OPERATIVE DIAGNOSIS:  UMBILICAL HERNIA  POST-OPERATIVE DIAGNOSIS:  UMBILICAL HERNIA  PROCEDURE:  Procedure(s): HERNIA REPAIR UMBILICAL  SURGEON:  Surgeon(s): Milferd Ansell, Arta Bruce, MD  ASSISTANT: none   ANESTHESIA:   local and general  Indications for procedure: [name] is a 65 y.o. year old male with symptoms of small umbilical hernia and pain.  Description of procedure: The patient was brought into the operative suite. Anesthesia was administered with General endotracheal anesthesia. WHO checklist was applied. The patient was then placed in supine. The area was prepped and draped in the usual sterile fashion.  Next the infraumbilical skin was anesthetized with Marcaine with Epinephrine. A semilunar infraumbilical incision was made. Cautery and blunt dissection was used to dissect down to the fascia. The hernia sac was dissected free from surrounding tissues in 360 degrees. The umbilical skin was dissected free of the hernia sac with cautery. The hernia sac was reduced and contained no visceral structures. The hernia defect was 3 cm in diameter. The hernia sac was removed. Due to the size of the hernia, a 5 x 10 cm Bard mesh was inserted into the preperitoneal space. Care was taken to lay the mesh flat. The fascial defect was then primarily closed with interrupted 0 PDS sutures. The umbilical skin was sutured to the fascia with a 3-0 vicryl. The deep dermal space was closed with a 3-0 vicryl. Marcaine with Epinephrine was injected into the muscle layer and around the fascia. The skin was closed with a 4-0 monocryl subcuticular suture. Dermabond was put in place for dressing. The patient awoke from anesthesia and was brought to pacu in stable condition. All counts were correct.  Findings: 3 cm umbilical hernia  Specimen: none  Blood loss: 10 ml  Local anesthesia: 30 ml Marcaine with Epinephrine  Complications: none  PLAN OF CARE:  Discharge to home after PACU  PATIENT DISPOSITION:  PACU - hemodynamically stable.  Gurney Maxin, M.D. General, Bariatric, & Minimally Invasive Surgery [facility], [place]  12/08/2020 2:03 PM

## 2020-12-08 NOTE — H&P (Signed)
Subjective   Chief Complaint: umbilical hernia   History of Present Illness: Michael Li is a 65 y.o. male who is seen today as an office consultation at the request of Dr. Ronnald Ramp for evaluation of umbilical hernia.   Review of Systems: A complete review of systems was obtained from the patient. I have reviewed this information and discussed as appropriate with the patient. See HPI as well for other ROS.  Review of Systems  Constitutional: Negative.  HENT: Negative.  Eyes: Negative.  Respiratory: Negative.  Cardiovascular: Negative.  Gastrointestinal: Negative.  Genitourinary: Negative.  Musculoskeletal: Negative.  Skin: Negative.  Neurological: Negative.  Endo/Heme/Allergies: Negative.  Psychiatric/Behavioral: Negative.    Medical History: Past Medical History:  Diagnosis Date   Diabetes mellitus without complication (CMS-HCC)   GERD (gastroesophageal reflux disease)   Liver disease   There is no problem list on file for this patient.  Past Surgical History:  Procedure Laterality Date   back surgery N/A   knee surgery N/A   shoulder surgery N/A    Allergies  Allergen Reactions   Cephalexin Anaphylaxis, Other (See Comments) and Swelling  Swelling in throat   Azilsartan Diarrhea   Current Outpatient Medications on File Prior to Visit  Medication Sig Dispense Refill   alfuzosin (UROXATRAL) 10 mg ER tablet   atorvastatin (LIPITOR) 10 MG tablet   dextroamphetamine-amphetamine (ADDERALL XR) 30 MG XR capsule   metFORMIN (GLUCOPHAGE) 1000 MG tablet   metoprolol tartrate (LOPRESSOR) 25 MG tablet   omeprazole (PRILOSEC) 20 MG DR capsule   XARELTO 20 mg tablet   No current facility-administered medications on file prior to visit.   Family History  Problem Relation Age of Onset   High blood pressure (Hypertension) Mother   High blood pressure (Hypertension) Father   Heart valve disease Father    Social History   Tobacco Use  Smoking Status Former Smoker   Smokeless Tobacco Not on file    Social History   Socioeconomic History   Marital status: Married  Tobacco Use   Smoking status: Former Smoker  Substance and Sexual Activity   Alcohol use: Yes   Drug use: Never   Objective:   Vitals:  10/29/20 1053  BP: 122/74  Pulse: 64  Temp: 36.7 C (98 F)  SpO2: 96%  Weight: 81.7 kg (180 lb 3.2 oz)  Height: 172.7 cm (5\' 8" )   Body mass index is 27.4 kg/m.  Physical Exam Constitutional:  Appearance: Normal appearance.  HENT:  Head: Normocephalic and atraumatic.  Pulmonary:  Effort: Pulmonary effort is normal.  Abdominal:  Comments: Moderate umbilical hernia  Musculoskeletal:  General: Normal range of motion.  Cervical back: Normal range of motion.  Neurological:  General: No focal deficit present.  Mental Status: He is alert and oriented to person, place, and time. Mental status is at baseline.  Psychiatric:  Mood and Affect: Mood normal.  Behavior: Behavior normal.  Thought Content: Thought content normal.    Labs, Imaging and Diagnostic Testing: I reviewed notes by Scarlette Calico  Assessment and Plan:  Diagnoses and all orders for this visit:  Umbilical hernia without obstruction or gangrene - [facility] Case Posting Request; Future  Paroxysmal atrial fibrillation (CMS-HCC)  The patient has a symptomatic reducible hernia. We discussed the etiology of his hernia, the risk of it enlarging, incarceration, obstruction, strangulation, and that it is unlikely to get smaller or better on its own. We discussed operative options of laparoscopic vs open repair with mesh including the risks of recurrence,  injury to intestines or abdominal organs, or chronic pain associated with mesh. We decided to proceed with open umbilical hernia repair with mesh as outpatient.

## 2020-12-09 ENCOUNTER — Encounter (HOSPITAL_COMMUNITY): Payer: Self-pay | Admitting: General Surgery

## 2020-12-09 NOTE — Anesthesia Postprocedure Evaluation (Signed)
Anesthesia Post Note  Patient: Michael Li  Procedure(s) Performed: HERNIA REPAIR UMBILICAL (Abdomen)     Patient location during evaluation: PACU Anesthesia Type: General Level of consciousness: sedated and patient cooperative Pain management: pain level controlled Vital Signs Assessment: post-procedure vital signs reviewed and stable Respiratory status: spontaneous breathing Cardiovascular status: stable Anesthetic complications: no   No notable events documented.  Last Vitals:  Vitals:   12/08/20 1545 12/08/20 1600  BP: 138/90 (!) 153/69  Pulse: (!) 112 80  Resp: 15 16  Temp:  36.9 C  SpO2: 91% 90%    Last Pain:  Vitals:   12/08/20 1600  TempSrc:   PainSc: 0-No pain                 Nolon Nations

## 2020-12-10 ENCOUNTER — Encounter (HOSPITAL_COMMUNITY): Payer: Self-pay | Admitting: General Surgery

## 2020-12-29 NOTE — Telephone Encounter (Signed)
**Note De-Identified Terrin Imparato Obfuscation** Vidur Younge KeyZackery Barefoot - PA Case ID: [id number] Outcome: This request has been approved using information available on the patient's profile.  Type:Prior Auth;Coverage Start Date:02/16/2020;Coverage End Date:03/17/2021 Drug: Xarelto 20MG  tablets Form [employer] Electronic [place] Form [phone] NCPDP)

## 2020-12-30 ENCOUNTER — Encounter: Payer: Self-pay | Admitting: Internal Medicine

## 2020-12-30 ENCOUNTER — Other Ambulatory Visit: Payer: Self-pay | Admitting: Internal Medicine

## 2020-12-30 DIAGNOSIS — F900 Attention-deficit hyperactivity disorder, predominantly inattentive type: Secondary | ICD-10-CM

## 2020-12-30 MED ORDER — AMPHETAMINE-DEXTROAMPHETAMINE 20 MG PO TABS: 20.0000 mg | ORAL_TABLET | Freq: Every day | ORAL | 0 refills | Status: DC | PRN

## 2020-12-30 MED ORDER — AMPHETAMINE-DEXTROAMPHET ER 30 MG PO CP24: 30.0000 mg | ORAL_CAPSULE | ORAL | 0 refills | Status: DC

## 2021-01-25 ENCOUNTER — Other Ambulatory Visit: Payer: Self-pay | Admitting: Internal Medicine

## 2021-01-25 DIAGNOSIS — E118 Type 2 diabetes mellitus with unspecified complications: Secondary | ICD-10-CM

## 2021-01-28 ENCOUNTER — Other Ambulatory Visit: Payer: Self-pay | Admitting: Internal Medicine

## 2021-01-28 DIAGNOSIS — F900 Attention-deficit hyperactivity disorder, predominantly inattentive type: Secondary | ICD-10-CM

## 2021-01-28 MED ORDER — AMPHETAMINE-DEXTROAMPHETAMINE 20 MG PO TABS: 20.0000 mg | ORAL_TABLET | Freq: Every day | ORAL | 0 refills | Status: DC | PRN

## 2021-01-28 MED ORDER — AMPHETAMINE-DEXTROAMPHET ER 30 MG PO CP24: 30.0000 mg | ORAL_CAPSULE | ORAL | 0 refills | Status: DC

## 2021-02-23 ENCOUNTER — Other Ambulatory Visit: Payer: Self-pay | Admitting: Internal Medicine

## 2021-02-23 DIAGNOSIS — R3911 Hesitancy of micturition: Secondary | ICD-10-CM

## 2021-02-23 DIAGNOSIS — N401 Enlarged prostate with lower urinary tract symptoms: Secondary | ICD-10-CM

## 2021-02-23 DIAGNOSIS — E118 Type 2 diabetes mellitus with unspecified complications: Secondary | ICD-10-CM

## 2021-02-24 ENCOUNTER — Telehealth: Payer: Self-pay | Admitting: Internal Medicine

## 2021-02-24 DIAGNOSIS — E785 Hyperlipidemia, unspecified: Secondary | ICD-10-CM

## 2021-02-24 DIAGNOSIS — I480 Paroxysmal atrial fibrillation: Secondary | ICD-10-CM

## 2021-02-25 NOTE — Telephone Encounter (Signed)
1.Medication Requested: Alfuzosin HCl 10 MG Dapagliflozin Propanediol 10 MG Rivaroxaban 20 MG Atorvastatin Calcium 10 MG    2. Pharmacy (Name, [place]):  [id number] - [address], [place] Phone:  [phone]  Fax:  [phone]      3. On Med List: yes  4. Last Visit with PCP: 07.13.22  5. Next visit date with PCP: 02.02.23  **Patient is requesting short fill supply until appt**   Agent: Please be advised that RX refills may take up to 3 business days. We ask that you follow-up with your pharmacy.

## 2021-02-26 ENCOUNTER — Other Ambulatory Visit: Payer: Self-pay | Admitting: Internal Medicine

## 2021-02-26 DIAGNOSIS — N401 Enlarged prostate with lower urinary tract symptoms: Secondary | ICD-10-CM

## 2021-02-26 DIAGNOSIS — E785 Hyperlipidemia, unspecified: Secondary | ICD-10-CM

## 2021-02-26 DIAGNOSIS — I480 Paroxysmal atrial fibrillation: Secondary | ICD-10-CM

## 2021-02-26 DIAGNOSIS — E119 Type 2 diabetes mellitus without complications: Secondary | ICD-10-CM

## 2021-02-26 DIAGNOSIS — Z794 Long term (current) use of insulin: Secondary | ICD-10-CM

## 2021-02-26 DIAGNOSIS — E118 Type 2 diabetes mellitus with unspecified complications: Secondary | ICD-10-CM

## 2021-02-26 MED ORDER — ATORVASTATIN CALCIUM 10 MG PO TABS: ORAL_TABLET | ORAL | 0 refills | Status: DC

## 2021-02-26 MED ORDER — RIVAROXABAN 20 MG PO TABS: ORAL_TABLET | ORAL | 0 refills | Status: DC

## 2021-02-26 MED ORDER — DAPAGLIFLOZIN PROPANEDIOL 10 MG PO TABS: 10.0000 mg | ORAL_TABLET | Freq: Every day | ORAL | 0 refills | Status: DC

## 2021-02-26 MED ORDER — ALFUZOSIN HCL ER 10 MG PO TB24: ORAL_TABLET | ORAL | 0 refills | Status: DC

## 2021-03-11 ENCOUNTER — Telehealth: Payer: Self-pay | Admitting: Internal Medicine

## 2021-03-11 NOTE — Telephone Encounter (Signed)
1.Medication Requested: amphetamine-dextroamphetamine (ADDERALL XR) 30 MG 24 hr capsule amphetamine-dextroamphetamine (ADDERALL) 20 MG tablet 2. Pharmacy (Name, [place]): [facility] #[id number] - [address], [place] Phone:  [phone]  Fax:  [phone]     3. On Med List: y   35. Last Visit with PCP:  5. Next visit date with PCP:   Agent: Please be advised that RX refills may take up to 3 business days. We ask that you follow-up with your pharmacy.

## 2021-03-12 ENCOUNTER — Other Ambulatory Visit: Payer: Self-pay | Admitting: Internal Medicine

## 2021-03-12 DIAGNOSIS — F900 Attention-deficit hyperactivity disorder, predominantly inattentive type: Secondary | ICD-10-CM

## 2021-03-12 MED ORDER — AMPHETAMINE-DEXTROAMPHET ER 30 MG PO CP24: 30.0000 mg | ORAL_CAPSULE | ORAL | 0 refills | Status: DC

## 2021-03-12 MED ORDER — AMPHETAMINE-DEXTROAMPHETAMINE 20 MG PO TABS: 20.0000 mg | ORAL_TABLET | Freq: Every day | ORAL | 0 refills | Status: DC | PRN

## 2021-03-18 ENCOUNTER — Ambulatory Visit (INDEPENDENT_AMBULATORY_CARE_PROVIDER_SITE_OTHER): Payer: 59 | Admitting: Internal Medicine

## 2021-03-18 ENCOUNTER — Other Ambulatory Visit: Payer: Self-pay

## 2021-03-18 ENCOUNTER — Encounter: Payer: Self-pay | Admitting: Internal Medicine

## 2021-03-18 VITALS — BP 128/72 | HR 60 | Temp 98.1°F | Ht 68.0 in | Wt 173.0 lb

## 2021-03-18 DIAGNOSIS — Z0001 Encounter for general adult medical examination with abnormal findings: Secondary | ICD-10-CM

## 2021-03-18 DIAGNOSIS — E119 Type 2 diabetes mellitus without complications: Secondary | ICD-10-CM | POA: Diagnosis not present

## 2021-03-18 DIAGNOSIS — R3912 Poor urinary stream: Secondary | ICD-10-CM

## 2021-03-18 DIAGNOSIS — E785 Hyperlipidemia, unspecified: Secondary | ICD-10-CM

## 2021-03-18 DIAGNOSIS — N521 Erectile dysfunction due to diseases classified elsewhere: Secondary | ICD-10-CM

## 2021-03-18 DIAGNOSIS — N5201 Erectile dysfunction due to arterial insufficiency: Secondary | ICD-10-CM

## 2021-03-18 DIAGNOSIS — Z794 Long term (current) use of insulin: Secondary | ICD-10-CM | POA: Diagnosis not present

## 2021-03-18 DIAGNOSIS — I779 Disorder of arteries and arterioles, unspecified: Secondary | ICD-10-CM

## 2021-03-18 DIAGNOSIS — R3911 Hesitancy of micturition: Secondary | ICD-10-CM

## 2021-03-18 DIAGNOSIS — N401 Enlarged prostate with lower urinary tract symptoms: Secondary | ICD-10-CM

## 2021-03-18 DIAGNOSIS — I10 Essential (primary) hypertension: Secondary | ICD-10-CM

## 2021-03-18 LAB — LIPID PANEL
Cholesterol: 113 mg/dL (ref 0–200)
HDL: 46.1 mg/dL (ref >39.00)
LDL Cholesterol: 52 mg/dL (ref 0–99)
NonHDL: 67.06
Total CHOL/HDL Ratio: 2
Triglycerides: 73 mg/dL (ref 0.0–149.0)
VLDL: 14.6 mg/dL (ref 0.0–40.0)

## 2021-03-18 LAB — BASIC METABOLIC PANEL
BUN: 25 mg/dL — ABNORMAL HIGH (ref 6–23)
CO2: 27 mEq/L (ref 19–32)
Calcium: 9.8 mg/dL (ref 8.4–10.5)
Chloride: 105 mEq/L (ref 96–112)
Creatinine, Ser: 0.96 mg/dL (ref 0.40–1.50)
GFR: 82.7 mL/min (ref >60.00)
Glucose, Bld: 134 mg/dL — ABNORMAL HIGH (ref 70–99)
Potassium: 5 mEq/L (ref 3.5–5.1)
Sodium: 141 mEq/L (ref 135–145)

## 2021-03-18 LAB — HEMOGLOBIN A1C: Hgb A1c MFr Bld: 7 % — ABNORMAL HIGH (ref 4.6–6.5)

## 2021-03-18 LAB — URINALYSIS, ROUTINE W REFLEX MICROSCOPIC
Bilirubin Urine: NEGATIVE
Hgb urine dipstick: NEGATIVE
Ketones, ur: NEGATIVE
Leukocytes,Ua: NEGATIVE
Nitrite: NEGATIVE
RBC / HPF: NONE SEEN (ref 0–?)
Specific Gravity, Urine: 1.02 (ref 1.000–1.030)
Total Protein, Urine: NEGATIVE
Urine Glucose: >=1000 — AB
Urobilinogen, UA: 0.2 (ref 0.0–1.0)
pH: 5.5 (ref 5.0–8.0)

## 2021-03-18 LAB — MICROALBUMIN / CREATININE URINE RATIO
Creatinine,U: 76.3 mg/dL
Microalb Creat Ratio: 1.3 mg/g (ref 0.0–30.0)
Microalb, Ur: 1 mg/dL (ref 0.0–1.9)

## 2021-03-18 LAB — TESTOSTERONE TOTAL,FREE,BIO, MALES
Albumin: 4.4 g/dL (ref 3.6–5.1)
Sex Hormone Binding: 38 nmol/L (ref 22–77)
Testosterone, Bioavailable: 130.6 ng/dL (ref 110.0–575.0)
Testosterone, Free: 64.9 pg/mL (ref 46.0–224.0)
Testosterone: 533 ng/dL (ref 250–827)

## 2021-03-18 LAB — PSA: PSA: 1.46 ng/mL (ref 0.10–4.00)

## 2021-03-18 NOTE — Patient Instructions (Signed)

## 2021-03-18 NOTE — Progress Notes (Signed)
Subjective:  Patient ID: Michael Li, male    DOB: 1955/12/23  Age: 66 y.o. MRN: [id number]  CC: Hyperlipidemia, Diabetes, and Annual Exam  This visit occurred during the SARS-CoV-2 public health emergency.  Safety protocols were in place, including screening questions prior to the visit, additional usage of staff PPE, and extensive cleaning of exam room while observing appropriate contact time as indicated for disinfecting solutions.    HPI Michael Li presents for  CPX and f/up -  He is active and denies chest pain, shortness of breath, palpitations, diaphoresis, dizziness, or lightheadedness.  Outpatient Medications Prior to Visit  Medication Sig Dispense Refill   amphetamine-dextroamphetamine (ADDERALL XR) 30 MG 24 hr capsule Take 1 capsule (30 mg total) by mouth every morning. 30 capsule 0   amphetamine-dextroamphetamine (ADDERALL) 20 MG tablet Take 1 tablet (20 mg total) by mouth daily as needed (in the afternoon if needed for focus.). 30 tablet 0   atorvastatin (LIPITOR) 10 MG tablet TAKE 1 TABLET(10 MG) BY MOUTH DAILY 90 tablet 0   BD PEN NEEDLE MICRO U/F 32G X 6 MM MISC USE 2 PEN NEEDLES DAILY 200 each 3   Continuous Blood Gluc Receiver (FREESTYLE LIBRE 14 DAY READER) DEVI 1 Act by Does not apply route daily. 2 each 5   Continuous Blood Gluc Sensor (FREESTYLE LIBRE 14 DAY SENSOR) MISC USE AS DIRECTED EVERY 14 DAYS 2 each 5   dapagliflozin propanediol (FARXIGA) 10 MG TABS tablet Take 1 tablet (10 mg total) by mouth daily before breakfast. 90 tablet 0   insulin glargine, 1 Unit Dial, (TOUJEO SOLOSTAR) 300 UNIT/ML Solostar Pen Inject 20 Units into the skin every morning. 4.5 mL 1   metFORMIN (GLUCOPHAGE) 1000 MG tablet Take 1 tablet (1,000 mg total) by mouth 2 (two) times daily with a meal. 180 tablet 3   metoprolol tartrate (LOPRESSOR) 25 MG tablet Take 0.5 tablets (12.5 mg total) by mouth 2 (two) times daily. Patient needs appointment for further refills. 1 st attempt 180  tablet 1   omeprazole (PRILOSEC) 20 MG capsule TAKE 1 CAPSULE(20 MG) BY MOUTH EVERY EVENING 90 capsule 1   rivaroxaban (XARELTO) 20 MG TABS tablet TAKE 1 TABLET(20 MG) BY MOUTH DAILY WITH SUPPER 90 tablet 0   alfuzosin (UROXATRAL) 10 MG 24 hr tablet TAKE 1 TABLET(10 MG) BY MOUTH DAILY WITH BREAKFAST 90 tablet 0   tadalafil (CIALIS) 20 MG tablet TAKE 1 TABLET BY MOUTH DAILY AS NEEDED FOR E.D. 10 tablet 5   No facility-administered medications prior to visit.    ROS Review of Systems  Constitutional:  Negative for chills, diaphoresis, fatigue and fever.  HENT: Negative.    Eyes: Negative.   Respiratory:  Negative for cough, chest tightness, shortness of breath and wheezing.   Cardiovascular:  Negative for chest pain, palpitations and leg swelling.  Gastrointestinal:  Negative for abdominal pain, constipation, diarrhea, nausea and vomiting.  Genitourinary:  Positive for difficulty urinating. Negative for hematuria, scrotal swelling and testicular pain.       Weak urine stream  ++ED  Musculoskeletal: Negative.  Negative for arthralgias and myalgias.  Skin: Negative.  Negative for rash.  Neurological: Negative.  Negative for dizziness, weakness, light-headedness and headaches.  Hematological:  Negative for adenopathy. Does not bruise/bleed easily.  Psychiatric/Behavioral: Negative.     Objective:  BP 128/72 (BP Location: Left Arm, Patient Position: Sitting, Cuff Size: Large)    Pulse 60    Temp 98.1 F (36.7 C) (Oral)  Ht 5\' 8"  (1.727 m)    Wt 173 lb (78.5 kg)    SpO2 96%    BMI 26.30 kg/m   BP Readings from Last 3 Encounters:  03/18/21 128/72  12/08/20 (!) 153/69  12/01/20 130/80    Wt Readings from Last 3 Encounters:  03/18/21 173 lb (78.5 kg)  12/08/20 174 lb 13.2 oz (79.3 kg)  12/01/20 174 lb 12.8 oz (79.3 kg)    Physical Exam Vitals reviewed.  HENT:     Nose: Nose normal.     Mouth/Throat:     Mouth: Mucous membranes are moist.  Eyes:     General: No scleral  icterus.    Conjunctiva/sclera: Conjunctivae normal.  Cardiovascular:     Rate and Rhythm: Normal rate and regular rhythm.     Pulses: Normal pulses.     Heart sounds: No murmur heard. Pulmonary:     Effort: Pulmonary effort is normal.     Breath sounds: No stridor. No wheezing, rhonchi or rales.  Abdominal:     General: Abdomen is flat.     Palpations: There is no mass.     Tenderness: There is no abdominal tenderness. There is no guarding.     Hernia: No hernia is present. There is no hernia in the left inguinal area or right inguinal area.  Genitourinary:    Pubic Area: No rash.      Penis: Normal and circumcised.      Testes: Normal.     Epididymis:     Right: Normal.     Left: Normal.     Prostate: Enlarged. Not tender and no nodules present.     Rectum: Normal. Guaiac result negative. No mass, tenderness, anal fissure, external hemorrhoid or internal hemorrhoid. Normal anal tone.  Musculoskeletal:        General: Normal range of motion.     Cervical back: Neck supple.     Right lower leg: No edema.     Left lower leg: No edema.  Lymphadenopathy:     Cervical: No cervical adenopathy.     Lower Body: No right inguinal adenopathy. No left inguinal adenopathy.  Skin:    General: Skin is warm and dry.  Neurological:     General: No focal deficit present.     Mental Status: He is alert.  Psychiatric:        Mood and Affect: Mood normal.        Behavior: Behavior normal.    Lab Results  Component Value Date   WBC 4.7 12/01/2020   HGB 15.2 12/01/2020   HCT 45.4 12/01/2020   PLT 156 12/01/2020   GLUCOSE 134 (H) 03/18/2021   CHOL 113 03/18/2021   TRIG 73.0 03/18/2021   HDL 46.10 03/18/2021   LDLCALC 52 03/18/2021   ALT 21 12/01/2020   AST 21 12/01/2020   NA 141 03/18/2021   K 5.0 03/18/2021   CL 105 03/18/2021   CREATININE 0.96 03/18/2021   BUN 25 (H) 03/18/2021   CO2 27 03/18/2021   TSH 1.73 08/26/2020   PSA 1.46 03/18/2021   INR 1.0 12/08/2020   HGBA1C 7.0  (H) 03/18/2021   MICROALBUR 1.0 03/18/2021    No results found.  Assessment & Plan:   Michael Li was seen today for hyperlipidemia, diabetes and annual exam.  Diagnoses and all orders for this visit:  Insulin-requiring or dependent type II diabetes mellitus ([place])- His blood sugar is adequately well controlled. -     Microalbumin / creatinine  urine ratio; Future -     Hemoglobin A1c; Future -     Basic metabolic panel; Future -     HM Diabetes Foot Exam -     Basic metabolic panel -     Hemoglobin A1c -     Microalbumin / creatinine urine ratio  Benign prostatic hyperplasia with urinary hesitancy -     PSA; Future -     PSA  Hyperlipidemia LDL goal <100- LDL goal achieved. Doing well on the statin  -     Lipid panel; Future -     Lipid panel  Essential hypertension - His blood pressure is well controlled. -     Microalbumin / creatinine urine ratio; Future -     Urinalysis, Routine w reflex microscopic; Future -     Basic metabolic panel; Future -     Basic metabolic panel -     Urinalysis, Routine w reflex microscopic -     Microalbumin / creatinine urine ratio  Erectile dysfunction due to arterial disease ([place])- His testosterone level is normal. -     Testosterone Total,Free,Bio, Males; Future -     Testosterone Total,Free,Bio, Males  Encounter for general adult medical examination with abnormal findings- Exam completed, labs reviewed, vaccines are up-to-date, cancer screenings are up-to-date, patient education was given.  Benign prostatic hyperplasia with weak urinary stream -     alfuzosin (UROXATRAL) 10 MG 24 hr tablet; TAKE 1 TABLET(10 MG) BY MOUTH DAILY WITH BREAKFAST  Erectile dysfunction due to arterial insufficiency -     tadalafil (CIALIS) 20 MG tablet; TAKE 1 TABLET BY MOUTH DAILY AS NEEDED FOR E.D.   I am having Mikeal Hawthorne T. Nadara Mustard "Sam" maintain his metFORMIN, metoprolol tartrate, Toujeo SoloStar, FreeStyle Libre 14 Day Reader, [employer] 14 Day Sensor,  omeprazole, BD Pen Needle Micro U/F, atorvastatin, rivaroxaban, dapagliflozin propanediol, amphetamine-dextroamphetamine, amphetamine-dextroamphetamine, alfuzosin, and tadalafil.  Meds ordered this encounter  Medications   alfuzosin (UROXATRAL) 10 MG 24 hr tablet    Sig: TAKE 1 TABLET(10 MG) BY MOUTH DAILY WITH BREAKFAST    Dispense:  90 tablet    Refill:  1   tadalafil (CIALIS) 20 MG tablet    Sig: TAKE 1 TABLET BY MOUTH DAILY AS NEEDED FOR E.D.    Dispense:  10 tablet    Refill:  5     Follow-up: Return in about 6 months (around 09/15/2021).  Scarlette Calico, MD

## 2021-03-19 ENCOUNTER — Encounter: Payer: Self-pay | Admitting: Internal Medicine

## 2021-03-19 DIAGNOSIS — N401 Enlarged prostate with lower urinary tract symptoms: Secondary | ICD-10-CM | POA: Insufficient documentation

## 2021-03-19 MED ORDER — TADALAFIL 20 MG PO TABS: ORAL_TABLET | ORAL | 5 refills | Status: DC

## 2021-03-19 MED ORDER — ALFUZOSIN HCL ER 10 MG PO TB24: ORAL_TABLET | ORAL | 1 refills | Status: DC

## 2021-03-20 ENCOUNTER — Other Ambulatory Visit: Payer: Self-pay | Admitting: Internal Medicine

## 2021-03-20 DIAGNOSIS — F900 Attention-deficit hyperactivity disorder, predominantly inattentive type: Secondary | ICD-10-CM | POA: Insufficient documentation

## 2021-03-20 MED ORDER — LISDEXAMFETAMINE DIMESYLATE 30 MG PO CAPS: 30.0000 mg | ORAL_CAPSULE | Freq: Every day | ORAL | 0 refills | Status: DC

## 2021-04-09 ENCOUNTER — Telehealth: Payer: Self-pay

## 2021-04-09 NOTE — Telephone Encounter (Signed)
Approved 03/10/2021-04/09/2022

## 2021-04-21 ENCOUNTER — Other Ambulatory Visit: Payer: Self-pay | Admitting: Internal Medicine

## 2021-04-21 DIAGNOSIS — F900 Attention-deficit hyperactivity disorder, predominantly inattentive type: Secondary | ICD-10-CM

## 2021-04-22 MED ORDER — LISDEXAMFETAMINE DIMESYLATE 30 MG PO CAPS: 30.0000 mg | ORAL_CAPSULE | Freq: Every day | ORAL | 0 refills | Status: DC

## 2021-04-30 ENCOUNTER — Ambulatory Visit: Payer: 59 | Admitting: Cardiology

## 2021-05-24 ENCOUNTER — Other Ambulatory Visit: Payer: Self-pay | Admitting: Internal Medicine

## 2021-05-24 DIAGNOSIS — E118 Type 2 diabetes mellitus with unspecified complications: Secondary | ICD-10-CM

## 2021-05-24 DIAGNOSIS — E785 Hyperlipidemia, unspecified: Secondary | ICD-10-CM

## 2021-05-24 DIAGNOSIS — I480 Paroxysmal atrial fibrillation: Secondary | ICD-10-CM

## 2021-05-24 DIAGNOSIS — F900 Attention-deficit hyperactivity disorder, predominantly inattentive type: Secondary | ICD-10-CM

## 2021-05-24 MED ORDER — ATORVASTATIN CALCIUM 10 MG PO TABS: ORAL_TABLET | ORAL | 0 refills | Status: DC

## 2021-05-24 MED ORDER — LISDEXAMFETAMINE DIMESYLATE 30 MG PO CAPS: 30.0000 mg | ORAL_CAPSULE | Freq: Every day | ORAL | 0 refills | Status: DC

## 2021-05-24 MED ORDER — RIVAROXABAN 20 MG PO TABS: ORAL_TABLET | ORAL | 0 refills | Status: DC

## 2021-05-24 MED ORDER — DAPAGLIFLOZIN PROPANEDIOL 10 MG PO TABS: 10.0000 mg | ORAL_TABLET | Freq: Every day | ORAL | 0 refills | Status: DC

## 2021-05-26 ENCOUNTER — Other Ambulatory Visit: Payer: Self-pay | Admitting: Internal Medicine

## 2021-05-26 DIAGNOSIS — E118 Type 2 diabetes mellitus with unspecified complications: Secondary | ICD-10-CM

## 2021-06-24 ENCOUNTER — Other Ambulatory Visit: Payer: Self-pay | Admitting: Internal Medicine

## 2021-06-24 DIAGNOSIS — F900 Attention-deficit hyperactivity disorder, predominantly inattentive type: Secondary | ICD-10-CM

## 2021-06-25 MED ORDER — LISDEXAMFETAMINE DIMESYLATE 30 MG PO CAPS: 30.0000 mg | ORAL_CAPSULE | Freq: Every day | ORAL | 0 refills | Status: DC

## 2021-07-29 ENCOUNTER — Other Ambulatory Visit: Payer: Self-pay | Admitting: Internal Medicine

## 2021-07-29 ENCOUNTER — Other Ambulatory Visit: Payer: Self-pay | Admitting: Nurse Practitioner

## 2021-07-29 DIAGNOSIS — F900 Attention-deficit hyperactivity disorder, predominantly inattentive type: Secondary | ICD-10-CM

## 2021-07-29 DIAGNOSIS — I480 Paroxysmal atrial fibrillation: Secondary | ICD-10-CM

## 2021-07-29 DIAGNOSIS — E118 Type 2 diabetes mellitus with unspecified complications: Secondary | ICD-10-CM

## 2021-07-29 DIAGNOSIS — E785 Hyperlipidemia, unspecified: Secondary | ICD-10-CM

## 2021-07-29 MED ORDER — LISDEXAMFETAMINE DIMESYLATE 30 MG PO CAPS: 30.0000 mg | ORAL_CAPSULE | Freq: Every day | ORAL | 0 refills | Status: DC

## 2021-07-29 MED ORDER — ATORVASTATIN CALCIUM 10 MG PO TABS: ORAL_TABLET | ORAL | 0 refills | Status: DC

## 2021-07-29 MED ORDER — RIVAROXABAN 20 MG PO TABS: ORAL_TABLET | ORAL | 0 refills | Status: DC

## 2021-07-29 MED ORDER — DAPAGLIFLOZIN PROPANEDIOL 10 MG PO TABS: 10.0000 mg | ORAL_TABLET | Freq: Every day | ORAL | 0 refills | Status: DC

## 2021-08-25 ENCOUNTER — Other Ambulatory Visit: Payer: Self-pay | Admitting: Internal Medicine

## 2021-08-25 DIAGNOSIS — I480 Paroxysmal atrial fibrillation: Secondary | ICD-10-CM

## 2021-08-28 ENCOUNTER — Other Ambulatory Visit: Payer: Self-pay | Admitting: Internal Medicine

## 2021-08-28 DIAGNOSIS — N401 Enlarged prostate with lower urinary tract symptoms: Secondary | ICD-10-CM

## 2021-08-30 ENCOUNTER — Other Ambulatory Visit: Payer: Self-pay | Admitting: Internal Medicine

## 2021-08-30 DIAGNOSIS — I480 Paroxysmal atrial fibrillation: Secondary | ICD-10-CM

## 2021-08-30 DIAGNOSIS — F900 Attention-deficit hyperactivity disorder, predominantly inattentive type: Secondary | ICD-10-CM

## 2021-08-30 DIAGNOSIS — E118 Type 2 diabetes mellitus with unspecified complications: Secondary | ICD-10-CM

## 2021-08-30 DIAGNOSIS — E785 Hyperlipidemia, unspecified: Secondary | ICD-10-CM

## 2021-08-30 MED ORDER — DAPAGLIFLOZIN PROPANEDIOL 10 MG PO TABS: 10.0000 mg | ORAL_TABLET | Freq: Every day | ORAL | 0 refills | Status: DC

## 2021-08-30 MED ORDER — LISDEXAMFETAMINE DIMESYLATE 30 MG PO CAPS: 30.0000 mg | ORAL_CAPSULE | Freq: Every day | ORAL | 0 refills | Status: DC

## 2021-08-30 MED ORDER — METOPROLOL TARTRATE 25 MG PO TABS: 12.5000 mg | ORAL_TABLET | Freq: Two times a day (BID) | ORAL | 0 refills | Status: DC

## 2021-08-30 MED ORDER — ATORVASTATIN CALCIUM 10 MG PO TABS: ORAL_TABLET | ORAL | 0 refills | Status: DC

## 2021-08-30 MED ORDER — RIVAROXABAN 20 MG PO TABS: ORAL_TABLET | ORAL | 0 refills | Status: DC

## 2021-09-07 ENCOUNTER — Telehealth: Payer: 59 | Admitting: Family Medicine

## 2021-09-07 DIAGNOSIS — U071 COVID-19: Secondary | ICD-10-CM

## 2021-09-07 NOTE — Progress Notes (Signed)
[place]   Needs to speak with PCP about short term disability for work regarding COVID  Patient acknowledged agreement and understanding of the plan.

## 2021-09-10 ENCOUNTER — Ambulatory Visit (HOSPITAL_COMMUNITY)
Admission: RE | Admit: 2021-09-10 | Discharge: 2021-09-10 | Disposition: A | Discharge status: Home / Self Care | Payer: 59 | Source: Ambulatory Visit | Attending: Internal Medicine | Admitting: Internal Medicine

## 2021-09-10 VITALS — BP 137/75 | HR 60 | Temp 98.1°F | Resp 16

## 2021-09-10 DIAGNOSIS — U071 COVID-19: Secondary | ICD-10-CM | POA: Insufficient documentation

## 2021-09-10 NOTE — Discharge Instructions (Signed)
Your COVID test is pending.  We will call if it is positive.

## 2021-09-10 NOTE — ED Triage Notes (Signed)
Pt reports that his spouse has covid and tested positive last Friday. Reports tested positive for covid and had symptoms on Monday. Reports that still testing positive and work needing note before can return or negative test result. Unable to get into PCP.

## 2021-09-10 NOTE — ED Provider Notes (Addendum)
[facility]    CSN: [id number] Arrival date & time: 09/10/21  1011      History   Chief Complaint Chief Complaint  Patient presents with   Covid Positive    HPI Michael Li is a 66 y.o. male.   Patient reports that he tested positive for COVID-19 approximately 6 days ago.  He took another test today and it was still positive.  His significant other also tested positive for COVID-19.  He reports that he has had a little bit of a runny nose and fatigue but denies any other associated symptoms including cough, fever, chest pain, shortness of breath, nausea, vomiting, diarrhea, abdominal pain. Symptoms started 5 days ago.  He has not taken any medications for symptoms as he reports that "he does not like to take over-the-counter cold medications".  Patient reports that he is simply here to get a note to return to work as he may need to use his short-term disability instead of PTO.  He had video visit with PCP yesterday but reports they were not able to help him.  He is not able to be seen by PCP in person until next week.     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   Basal cell    Patient Active Problem List   Diagnosis Date Noted   Attention deficit hyperactivity disorder (ADHD), predominantly inattentive type 03/20/2021   Benign prostatic hyperplasia with weak urinary stream 03/19/2021   Encounter for general adult medical examination with abnormal findings 03/18/2021   Elevated coronary  artery calcium score 11/06/2020   Insulin-requiring or dependent type II diabetes mellitus ([place]) 08/26/2020   PAF (paroxysmal atrial fibrillation) ([place]) 08/26/2020   Essential hypertension 07/31/2019   Erectile dysfunction due to arterial insufficiency 02/13/2019   Benign prostatic hyperplasia with urinary hesitancy 08/07/2018   Routine general medical examination at a health care facility 01/22/2018   Hyperlipidemia LDL goal <100 01/22/2018   Diabetes mellitus due to underlying condition, controlled ([place]) 12/26/2016   Type II diabetes mellitus with manifestations ([place]) 12/26/2016   Atrial fibrillation ([place]) 10/25/2016   HNP (herniated nucleus pulposus), lumbar 09/12/2012   Hep C w/ coma, chronic ([place]) 04/03/2007   Erectile dysfunction due to arterial disease ([place]) 07/19/2006   GERD 07/19/2006    Past Surgical History:  Procedure Laterality Date   APPENDECTOMY     ATRIAL FIBRILLATION ABLATION N/A 03/27/2019   Procedure: ATRIAL FIBRILLATION ABLATION;  Surgeon: Constance Haw, MD;  Location: [facility];  Service: Cardiovascular;  Laterality: N/A;   ATRIAL FIBRILLATION ABLATION N/A 06/19/2019   Procedure: ATRIAL FIBRILLATION ABLATION;  Surgeon: Constance Haw, MD;  Location: [facility];  Service: Cardiovascular;  Laterality: N/A;   carotid injury       post tonsillectomy, R carotid injury with trach   HERNIA REPAIR  1973   inguinal- R side   KNEE SURGERY Right    LUMBAR LAMINECTOMY/DECOMPRESSION MICRODISCECTOMY  Right 09/12/2012   Procedure: LUMBAR LAMINECTOMY/DECOMPRESSION MICRODISCECTOMY 1 LEVEL;  Surgeon: Charlie Pitter, MD;  Location: [facility];  Service: Neurosurgery;  Laterality: Right;  Right lumbar three-four microdiscectomy   ORIF SHOULDER FRACTURE Right 06/02/2017   Procedure: OPEN REDUCTION INTERNAL FIXATION (ORIF) RIGHT GLENOID FRACTURE, POSSIBLE LATARJET CORACOID TRANSFER;  Surgeon: Meredith Pel, MD;  Location: [facility];  Service: Orthopedics;   Laterality: Right;   TEE WITHOUT CARDIOVERSION N/A 06/19/2019   Procedure: TRANSESOPHAGEAL ECHOCARDIOGRAM (TEE);  Surgeon: Thayer Headings, MD;  Location: [facility];  Service: Cardiovascular;  Laterality: N/A;   TONSILLECTOMY     TRACHEOSTOMY CLOSURE  1941   UMBILICAL HERNIA REPAIR N/A 12/08/2020   Procedure: HERNIA REPAIR UMBILICAL;  Surgeon: Kinsinger, Arta Bruce, MD;  Location: [facility];  Service: General;  Laterality: N/A;       Home Medications    Prior to Admission medications   Medication Sig Start Date End Date Taking? Authorizing Provider  alfuzosin (UROXATRAL) 10 MG 24 hr tablet TAKE 1 TABLET(10 MG) BY MOUTH DAILY WITH BREAKFAST 08/28/21   Janith Lima, MD  atorvastatin (LIPITOR) 10 MG tablet TAKE 1 TABLET(10 MG) BY MOUTH DAILY 08/30/21   Janith Lima, MD  BD PEN NEEDLE MICRO U/F 32G X 6 MM MISC USE 2 PEN NEEDLES DAILY 01/25/21   Janith Lima, MD  Continuous Blood Gluc Receiver (FREESTYLE LIBRE 14 DAY READER) DEVI 1 Act by Does not apply route daily. 08/26/20   Janith Lima, MD  Continuous Blood Gluc Sensor (FREESTYLE LIBRE 14 DAY SENSOR) MISC USE AS DIRECTED EVERY 14 DAYS 08/26/20   Janith Lima, MD  dapagliflozin propanediol (FARXIGA) 10 MG TABS tablet Take 1 tablet (10 mg total) by mouth daily before breakfast. 08/30/21   Janith Lima, MD  lisdexamfetamine (VYVANSE) 30 MG capsule Take 1 capsule (30 mg total) by mouth daily. 08/30/21   Janith Lima, MD  metFORMIN (GLUCOPHAGE) 1000 MG tablet Take 1 tablet (1,000 mg total) by mouth 2 (two) times daily with a meal. 08/26/20   Janith Lima, MD  metoprolol tartrate (LOPRESSOR) 25 MG tablet Take 0.5 tablets (12.5 mg total) by mouth 2 (two) times daily. 08/30/21   Janith Lima, MD  omeprazole (PRILOSEC) 20 MG capsule TAKE 1 CAPSULE(20 MG) BY MOUTH EVERY EVENING 08/28/20   Janith Lima, MD  rivaroxaban (XARELTO) 20 MG TABS tablet TAKE 1 TABLET(20 MG) BY MOUTH DAILY WITH SUPPER 08/30/21   Janith Lima, MD   tadalafil (CIALIS) 20 MG tablet TAKE 1 TABLET BY MOUTH DAILY AS NEEDED FOR E.D. 03/19/21   Janith Lima, MD  TOUJEO SOLOSTAR 300 UNIT/ML Solostar Pen ADMINISTER 20 UNITS UNDER THE SKIN EVERY MORNING 05/26/21   Janith Lima, MD  insulin aspart (NOVOLOG FLEXPEN) 100 unit/mL SOLN FlexPen Inject subcutaneously (just before each meal) 22-22-20 units 07/11/12 08/28/13  Marletta Lor, MD    Family History Family History  Problem Relation Age of Onset   COPD Mother    Heart attack Father    Diabetes Maternal Aunt    Cancer Paternal Grandmother        colon cancer   Diabetes Maternal Aunt     Social History Social History   Tobacco Use   Smoking status: Former    Years: 34.00    Types: Cigarettes    Quit date: 2007    Years since quitting: 16.5   Smokeless tobacco: Former    Types: Loss adjuster, chartered  Quit date: 02/15/2007  Vaping Use   Vaping Use: Never used  Substance Use Topics   Alcohol use: Yes    Comment: 3 glasses of wine per week    Drug use: Yes    Types: Marijuana    Comment: gummies occasionally     Allergies   Cephalexin and Edarbi [azilsartan]   Review of Systems Review of Systems Per HPI  Physical Exam Triage Vital Signs ED Triage Vitals  Enc Vitals Group     BP 09/10/21 1033 137/75     Pulse Rate 09/10/21 1033 60     Resp 09/10/21 1033 16     Temp 09/10/21 1033 98.1 F (36.7 C)     Temp Source 09/10/21 1033 Oral     SpO2 09/10/21 1033 95 %     Weight --      Height --      Head Circumference --      Peak Flow --      Pain Score 09/10/21 1032 0     Pain Loc --      Pain Edu? --      Excl. in [place]? --    No data found.  Updated Vital Signs BP 137/75 (BP Location: Right Arm)   Pulse 60   Temp 98.1 F (36.7 C) (Oral)   Resp 16   SpO2 95%   Visual Acuity Right Eye Distance:   Left Eye Distance:   Bilateral Distance:    Right Eye Near:   Left Eye Near:    Bilateral Near:     Physical Exam Constitutional:      General: He is not in  acute distress.    Appearance: Normal appearance. He is not toxic-appearing or diaphoretic.  HENT:     Head: Normocephalic and atraumatic.     Right Ear: Tympanic membrane and ear canal normal.     Left Ear: Tympanic membrane and ear canal normal.     Nose: Congestion present.     Mouth/Throat:     Mouth: Mucous membranes are moist.     Pharynx: No posterior oropharyngeal erythema.  Eyes:     Extraocular Movements: Extraocular movements intact.     Conjunctiva/sclera: Conjunctivae normal.     Pupils: Pupils are equal, round, and reactive to light.  Cardiovascular:     Rate and Rhythm: Normal rate and regular rhythm.     Pulses: Normal pulses.     Heart sounds: Normal heart sounds.  Pulmonary:     Effort: Pulmonary effort is normal. No respiratory distress.     Breath sounds: Normal breath sounds. No stridor. No wheezing, rhonchi or rales.  Abdominal:     General: Abdomen is flat. Bowel sounds are normal.     Palpations: Abdomen is soft.  Musculoskeletal:        General: Normal range of motion.     Cervical back: Normal range of motion.  Skin:    General: Skin is warm and dry.  Neurological:     General: No focal deficit present.     Mental Status: He is alert and oriented to person, place, and time. Mental status is at baseline.  Psychiatric:        Mood and Affect: Mood normal.        Behavior: Behavior normal.      UC Treatments / Results  Labs (all labs ordered are listed, but only abnormal results are displayed) Labs Reviewed  SARS CORONAVIRUS 2 (TAT 6-24 HRS)  EKG   Radiology No results found.  Procedures Procedures (including critical care time)  Medications Ordered in UC Medications - No data to display  Initial Impression / Assessment and Plan / UC Course  I have reviewed the triage vital signs and the nursing notes.  Pertinent labs & imaging results that were available during my care of the patient were reviewed by me and considered in my  medical decision making (see chart for details).     Patient tested positive for COVID-19 with an at home test.  He is requesting COVID PCR today for employer.  COVID PCR pending.  Discussed antiviral medications but patient immediately stated that he did not want to take them.  Discussed supportive care for COVID 19 with patient.  Patient states that he is simply here to get a note to return to work.  Advised patient that urgent care is not able to provide any type of short-term disability or FMLA. Patient voiced understanding.   Provided patient with work note and discussed quarantine and isolation for COVID-19.  Discussed return precautions.  Patient verbalized understanding and was agreeable with plan. Final Clinical Impressions(s) / UC Diagnoses   Final diagnoses:  [id number]     Discharge Instructions      Your COVID test is pending.  We will call if it is positive.    ED Prescriptions   None    PDMP not reviewed this encounter.   Teodora Medici, [place] 09/10/21 [place], [place], [place] 09/10/21 1050

## 2021-09-11 LAB — SARS CORONAVIRUS 2 (TAT 6-24 HRS): SARS Coronavirus 2: POSITIVE — AB

## 2021-09-24 ENCOUNTER — Other Ambulatory Visit: Payer: Self-pay | Admitting: Internal Medicine

## 2021-09-24 DIAGNOSIS — E118 Type 2 diabetes mellitus with unspecified complications: Secondary | ICD-10-CM

## 2021-09-24 DIAGNOSIS — K219 Gastro-esophageal reflux disease without esophagitis: Secondary | ICD-10-CM

## 2021-09-28 ENCOUNTER — Other Ambulatory Visit: Payer: Self-pay | Admitting: Internal Medicine

## 2021-09-28 DIAGNOSIS — E785 Hyperlipidemia, unspecified: Secondary | ICD-10-CM

## 2021-09-28 DIAGNOSIS — E118 Type 2 diabetes mellitus with unspecified complications: Secondary | ICD-10-CM

## 2021-09-28 DIAGNOSIS — I480 Paroxysmal atrial fibrillation: Secondary | ICD-10-CM

## 2021-09-28 DIAGNOSIS — F900 Attention-deficit hyperactivity disorder, predominantly inattentive type: Secondary | ICD-10-CM

## 2021-09-29 ENCOUNTER — Encounter: Payer: Self-pay | Admitting: Internal Medicine

## 2021-09-29 ENCOUNTER — Other Ambulatory Visit: Payer: Self-pay | Admitting: Internal Medicine

## 2021-09-29 DIAGNOSIS — F900 Attention-deficit hyperactivity disorder, predominantly inattentive type: Secondary | ICD-10-CM

## 2021-09-29 DIAGNOSIS — K219 Gastro-esophageal reflux disease without esophagitis: Secondary | ICD-10-CM

## 2021-09-30 ENCOUNTER — Other Ambulatory Visit: Payer: Self-pay | Admitting: Internal Medicine

## 2021-09-30 DIAGNOSIS — F900 Attention-deficit hyperactivity disorder, predominantly inattentive type: Secondary | ICD-10-CM

## 2021-09-30 DIAGNOSIS — E119 Type 2 diabetes mellitus without complications: Secondary | ICD-10-CM

## 2021-09-30 DIAGNOSIS — K219 Gastro-esophageal reflux disease without esophagitis: Secondary | ICD-10-CM

## 2021-09-30 DIAGNOSIS — E118 Type 2 diabetes mellitus with unspecified complications: Secondary | ICD-10-CM

## 2021-09-30 MED ORDER — LISDEXAMFETAMINE DIMESYLATE 30 MG PO CAPS: 30.0000 mg | ORAL_CAPSULE | Freq: Every day | ORAL | 0 refills | Status: DC

## 2021-09-30 MED ORDER — OMEPRAZOLE 20 MG PO CPDR: 20.0000 mg | DELAYED_RELEASE_CAPSULE | Freq: Every day | ORAL | 1 refills | Status: DC

## 2021-09-30 MED ORDER — FREESTYLE LIBRE 14 DAY READER DEVI: 1.0000 | Freq: Every day | 5 refills | Status: DC

## 2021-09-30 MED ORDER — TOUJEO SOLOSTAR 300 UNIT/ML ~~LOC~~ SOPN: 20.0000 [IU] | PEN_INJECTOR | Freq: Every day | SUBCUTANEOUS | 1 refills | Status: DC

## 2021-09-30 MED ORDER — METFORMIN HCL 1000 MG PO TABS: 1000.0000 mg | ORAL_TABLET | Freq: Two times a day (BID) | ORAL | 0 refills | Status: DC

## 2021-09-30 MED ORDER — FREESTYLE LIBRE 14 DAY SENSOR MISC: 5 refills | Status: DC

## 2021-10-04 ENCOUNTER — Encounter: Payer: Self-pay | Admitting: Internal Medicine

## 2021-10-04 ENCOUNTER — Ambulatory Visit (INDEPENDENT_AMBULATORY_CARE_PROVIDER_SITE_OTHER): Payer: 59 | Admitting: Internal Medicine

## 2021-10-04 VITALS — BP 130/76 | HR 61 | Temp 98.1°F | Resp 16 | Ht 68.0 in | Wt 171.0 lb

## 2021-10-04 DIAGNOSIS — Z794 Long term (current) use of insulin: Secondary | ICD-10-CM

## 2021-10-04 DIAGNOSIS — E119 Type 2 diabetes mellitus without complications: Secondary | ICD-10-CM | POA: Diagnosis not present

## 2021-10-04 DIAGNOSIS — I10 Essential (primary) hypertension: Secondary | ICD-10-CM | POA: Diagnosis not present

## 2021-10-04 DIAGNOSIS — D696 Thrombocytopenia, unspecified: Secondary | ICD-10-CM

## 2021-10-04 DIAGNOSIS — I480 Paroxysmal atrial fibrillation: Secondary | ICD-10-CM

## 2021-10-04 DIAGNOSIS — F900 Attention-deficit hyperactivity disorder, predominantly inattentive type: Secondary | ICD-10-CM | POA: Diagnosis not present

## 2021-10-04 LAB — CBC WITH DIFFERENTIAL/PLATELET
Basophils Absolute: 0 10*3/uL (ref 0.0–0.1)
Basophils Relative: 0.1 % (ref 0.0–3.0)
Eosinophils Absolute: 0.1 10*3/uL (ref 0.0–0.7)
Eosinophils Relative: 1.4 % (ref 0.0–5.0)
HCT: 42.8 % (ref 39.0–52.0)
Hemoglobin: 14.9 g/dL (ref 13.0–17.0)
Lymphocytes Relative: 15.2 % (ref 12.0–46.0)
Lymphs Abs: 0.8 10*3/uL (ref 0.7–4.0)
MCHC: 34.9 g/dL (ref 30.0–36.0)
MCV: 87 fl (ref 78.0–100.0)
Monocytes Absolute: 0.5 10*3/uL (ref 0.1–1.0)
Monocytes Relative: 9.2 % (ref 3.0–12.0)
Neutro Abs: 4.1 10*3/uL (ref 1.4–7.7)
Neutrophils Relative %: 74.1 % (ref 43.0–77.0)
Platelets: 142 10*3/uL — ABNORMAL LOW (ref 150.0–400.0)
RBC: 4.92 Mil/uL (ref 4.22–5.81)
RDW: 13.7 % (ref 11.5–15.5)
WBC: 5.5 10*3/uL (ref 4.0–10.5)

## 2021-10-04 LAB — HEMOGLOBIN A1C: Hgb A1c MFr Bld: 6.9 % — ABNORMAL HIGH (ref 4.6–6.5)

## 2021-10-04 LAB — BASIC METABOLIC PANEL
BUN: 19 mg/dL (ref 6–23)
CO2: 25 mEq/L (ref 19–32)
Calcium: 9.4 mg/dL (ref 8.4–10.5)
Chloride: 101 mEq/L (ref 96–112)
Creatinine, Ser: 0.98 mg/dL (ref 0.40–1.50)
GFR: 80.37 mL/min (ref >60.00)
Glucose, Bld: 145 mg/dL — ABNORMAL HIGH (ref 70–99)
Potassium: 4.3 mEq/L (ref 3.5–5.1)
Sodium: 140 mEq/L (ref 135–145)

## 2021-10-04 LAB — TSH: TSH: 1.7 u[IU]/mL (ref 0.35–5.50)

## 2021-10-04 NOTE — Progress Notes (Unsigned)
Subjective:  Patient ID: Michael Li, male    DOB: 05-07-1955  Age: 66 y.o. MRN: [id number]  CC: Atrial Fibrillation, Diabetes, and Hypertension   HPI Michael Li presents for f/up -  He is active and denies chest pain, shortness of breath, diaphoresis, palpitations, or polys.  Pt states ADD status overall stable on current meds with overall good compliance and tolerability, and good effectiveness with respect to ability for concentration and task completion.   Outpatient Medications Prior to Visit  Medication Sig Dispense Refill   alfuzosin (UROXATRAL) 10 MG 24 hr tablet TAKE 1 TABLET(10 MG) BY MOUTH DAILY WITH BREAKFAST 90 tablet 1   atorvastatin (LIPITOR) 10 MG tablet TAKE 1 TABLET(10 MG) BY MOUTH DAILY 90 tablet 0   BD PEN NEEDLE MICRO U/F 32G X 6 MM MISC USE 2 PEN NEEDLES DAILY 200 each 3   Continuous Blood Gluc Receiver (FREESTYLE LIBRE 14 DAY READER) DEVI 1 Act by Does not apply route daily. 2 each 5   Continuous Blood Gluc Sensor (FREESTYLE LIBRE 14 DAY SENSOR) MISC USE AS DIRECTED EVERY 14 DAYS 2 each 5   dapagliflozin propanediol (FARXIGA) 10 MG TABS tablet Take 1 tablet (10 mg total) by mouth daily before breakfast. 90 tablet 0   insulin glargine, 1 Unit Dial, (TOUJEO SOLOSTAR) 300 UNIT/ML Solostar Pen Inject 20 Units into the skin daily. 4.5 mL 1   lisdexamfetamine (VYVANSE) 30 MG capsule Take 1 capsule (30 mg total) by mouth daily. 30 capsule 0   metFORMIN (GLUCOPHAGE) 1000 MG tablet Take 1 tablet (1,000 mg total) by mouth 2 (two) times daily with a meal. 180 tablet 0   metoprolol tartrate (LOPRESSOR) 25 MG tablet Take 0.5 tablets (12.5 mg total) by mouth 2 (two) times daily. 90 tablet 0   omeprazole (PRILOSEC) 20 MG capsule Take 1 capsule (20 mg total) by mouth daily. 90 capsule 1   rivaroxaban (XARELTO) 20 MG TABS tablet TAKE 1 TABLET(20 MG) BY MOUTH DAILY WITH SUPPER 90 tablet 0   tadalafil (CIALIS) 20 MG tablet TAKE 1 TABLET BY MOUTH DAILY AS NEEDED FOR E.D. 10  tablet 5   No facility-administered medications prior to visit.    ROS Review of Systems  Constitutional:  Negative for diaphoresis and fatigue.  HENT: Negative.    Eyes: Negative.   Respiratory:  Negative for cough, chest tightness, shortness of breath and wheezing.   Cardiovascular:  Negative for chest pain, palpitations and leg swelling.  Gastrointestinal:  Negative for abdominal pain, diarrhea, nausea and vomiting.  Endocrine: Negative.   Genitourinary: Negative.   Musculoskeletal: Negative.  Negative for back pain and myalgias.  Skin: Negative.   Neurological:  Negative for dizziness and weakness.  Hematological:  Negative for adenopathy. Does not bruise/bleed easily.  Psychiatric/Behavioral:  Positive for decreased concentration. Negative for confusion, sleep disturbance and suicidal ideas. The patient is not nervous/anxious.     Objective:  BP 130/76 (BP Location: Right Arm, Patient Position: Sitting, Cuff Size: Large)   Pulse 61   Temp 98.1 F (36.7 C) (Oral)   Resp 16   Ht '5\' 8"'$  (1.727 m)   Wt 171 lb (77.6 kg)   SpO2 93%   BMI 26.00 kg/m   BP Readings from Last 3 Encounters:  10/04/21 130/76  09/10/21 137/75  03/18/21 128/72    Wt Readings from Last 3 Encounters:  10/04/21 171 lb (77.6 kg)  03/18/21 173 lb (78.5 kg)  12/08/20 174 lb 13.2 oz (79.3 kg)  Physical Exam Vitals reviewed.  HENT:     Mouth/Throat:     Mouth: Mucous membranes are moist.  Eyes:     General: No scleral icterus.    Conjunctiva/sclera: Conjunctivae normal.  Cardiovascular:     Rate and Rhythm: Normal rate and regular rhythm.     Heart sounds: No murmur heard. Pulmonary:     Effort: Pulmonary effort is normal.     Breath sounds: No stridor. No wheezing, rhonchi or rales.  Abdominal:     General: Abdomen is flat.     Palpations: There is no mass.     Tenderness: There is no abdominal tenderness. There is no guarding.     Hernia: No hernia is present.  Musculoskeletal:         General: Normal range of motion.     Cervical back: Neck supple.     Right lower leg: No edema.     Left lower leg: No edema.  Lymphadenopathy:     Cervical: No cervical adenopathy.  Skin:    General: Skin is warm and dry.  Neurological:     General: No focal deficit present.     Mental Status: He is alert.     Lab Results  Component Value Date   WBC 5.5 10/04/2021   HGB 14.9 10/04/2021   HCT 42.8 10/04/2021   PLT 142.0 (L) 10/04/2021   GLUCOSE 145 (H) 10/04/2021   CHOL 113 03/18/2021   TRIG 73.0 03/18/2021   HDL 46.10 03/18/2021   LDLCALC 52 03/18/2021   ALT 21 12/01/2020   AST 21 12/01/2020   NA 140 10/04/2021   K 4.3 10/04/2021   CL 101 10/04/2021   CREATININE 0.98 10/04/2021   BUN 19 10/04/2021   CO2 25 10/04/2021   TSH 1.70 10/04/2021   PSA 1.46 03/18/2021   INR 1.0 12/08/2020   HGBA1C 6.9 (H) 10/04/2021   MICROALBUR 1.0 03/18/2021    No results found.  Assessment & Plan:   Michael Li was seen today for atrial fibrillation, diabetes and hypertension.  Diagnoses and all orders for this visit:  Essential hypertension- His blood pressure is well controlled. -     CBC with Differential/Platelet; Future -     TSH; Future -     TSH -     CBC with Differential/Platelet  Attention deficit hyperactivity disorder (ADHD), predominantly inattentive type  PAF (paroxysmal atrial fibrillation) (Michael Li)- He has good rate and rhythm control. -     TSH; Future -     TSH  Insulin-requiring or dependent type II diabetes mellitus (Michael Li)- His blood sugar is adequately well controlled. -     Basic metabolic panel; Future -     Hemoglobin A1c; Future -     Hemoglobin A1c -     Basic metabolic panel  Thrombocytopenia (HCC)- There is no history of bleeding or bruising.  Michael Li recheck this in 2 to 3 months.   I am having Michael Li "Michael Li" maintain his BD Pen Needle Micro U/F, tadalafil, alfuzosin, atorvastatin, rivaroxaban, dapagliflozin propanediol, metoprolol  tartrate, lisdexamfetamine, metFORMIN, omeprazole, Toujeo SoloStar, FreeStyle Libre 14 Day Sensor, and [employer] 14 Day Reader.  No orders of the defined types were placed in this encounter.    Follow-up: Return in about 6 months (around 04/06/2022).  Scarlette Calico, MD

## 2021-10-04 NOTE — Patient Instructions (Signed)
                                                                    [email or web address]                       

## 2021-10-07 DIAGNOSIS — D696 Thrombocytopenia, unspecified: Secondary | ICD-10-CM | POA: Insufficient documentation

## 2021-10-27 ENCOUNTER — Other Ambulatory Visit: Payer: Self-pay | Admitting: Internal Medicine

## 2021-10-27 DIAGNOSIS — F900 Attention-deficit hyperactivity disorder, predominantly inattentive type: Secondary | ICD-10-CM

## 2021-10-27 MED ORDER — LISDEXAMFETAMINE DIMESYLATE 30 MG PO CAPS: 30.0000 mg | ORAL_CAPSULE | Freq: Every day | ORAL | 0 refills | Status: DC

## 2021-11-18 ENCOUNTER — Ambulatory Visit (HOSPITAL_COMMUNITY)
Admission: RE | Admit: 2021-11-18 | Discharge: 2021-11-18 | Disposition: A | Discharge status: Home / Self Care | Payer: 59 | Source: Ambulatory Visit | Attending: Family Medicine | Admitting: Family Medicine

## 2021-11-18 ENCOUNTER — Ambulatory Visit (INDEPENDENT_AMBULATORY_CARE_PROVIDER_SITE_OTHER): Payer: 59

## 2021-11-18 ENCOUNTER — Encounter (HOSPITAL_COMMUNITY): Payer: Self-pay

## 2021-11-18 VITALS — BP 138/76 | HR 61 | Temp 98.2°F | Resp 20

## 2021-11-18 DIAGNOSIS — Z20822 Contact with and (suspected) exposure to covid-19: Secondary | ICD-10-CM | POA: Insufficient documentation

## 2021-11-18 DIAGNOSIS — J069 Acute upper respiratory infection, unspecified: Secondary | ICD-10-CM | POA: Diagnosis not present

## 2021-11-18 DIAGNOSIS — J441 Chronic obstructive pulmonary disease with (acute) exacerbation: Secondary | ICD-10-CM | POA: Diagnosis not present

## 2021-11-18 DIAGNOSIS — R062 Wheezing: Secondary | ICD-10-CM | POA: Diagnosis not present

## 2021-11-18 DIAGNOSIS — R059 Cough, unspecified: Secondary | ICD-10-CM | POA: Diagnosis not present

## 2021-11-18 DIAGNOSIS — R0602 Shortness of breath: Secondary | ICD-10-CM

## 2021-11-18 LAB — RESP PANEL BY RT-PCR (RSV, FLU A&B, COVID)  RVPGX2
Influenza A by PCR: NEGATIVE
Influenza B by PCR: NEGATIVE
Resp Syncytial Virus by PCR: NEGATIVE
SARS Coronavirus 2 by RT PCR: NEGATIVE

## 2021-11-18 MED ORDER — ALBUTEROL SULFATE HFA 108 (90 BASE) MCG/ACT IN AERS: 1.0000 | INHALATION_SPRAY | RESPIRATORY_TRACT | 0 refills | Status: DC | PRN

## 2021-11-18 MED ORDER — ALBUTEROL SULFATE (2.5 MG/3ML) 0.083% IN NEBU
2.5000 mg | INHALATION_SOLUTION | Freq: Once | RESPIRATORY_TRACT | Status: AC
Start: 2021-11-18 — End: 2021-11-18
  Administered 2021-11-18: 2.5 mg via RESPIRATORY_TRACT

## 2021-11-18 MED ORDER — ALBUTEROL SULFATE (2.5 MG/3ML) 0.083% IN NEBU
INHALATION_SOLUTION | RESPIRATORY_TRACT | Status: AC
  Filled 2021-11-18: qty 3

## 2021-11-18 MED ORDER — PREDNISONE 20 MG PO TABS: 40.0000 mg | ORAL_TABLET | Freq: Every day | ORAL | 0 refills | Status: AC

## 2021-11-18 NOTE — ED Triage Notes (Signed)
Pt states he had COVID 6 weeks ago and he I still SOB and has a productive cough for a couple of days. He is also complaining of fatigue. He is taking alka seltzer plus.

## 2021-11-18 NOTE — Discharge Instructions (Addendum)
You are given a breathing treatment with albuterol  Your chest x-ray did not show any pneumonia or fluid, but did show signs of bronchitis  Albuterol inhaler--do 2 puffs every 4 hours as needed for shortness of breath or wheezing  Take prednisone 20 mg--2 daily for 5 days

## 2021-11-18 NOTE — ED Provider Notes (Addendum)
[place]    CSN: [id number] Arrival date & time: 11/18/21  1545      History   Chief Complaint Chief Complaint  Patient presents with   Shortness of Breath    HPI Michael Li is a 66 y.o. male.    Shortness of Breath  Here with shortness of breath that began about 2 days ago.  Began having cough and mild nasal congestion on October 2.  No fever or chills.  No nausea or vomiting or diarrhea. Then on October 3 he started noticing trouble breathing.  It bothers him more when he moves around.  He did have COVID about 6 weeks ago.  Those symptoms did completely resolved.  Past medical history is significant for diabetes and for hypertension and for A-fib.  He has had ablation for the A-fib and takes metoprolol.    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   Basal cell    Patient Active Problem List   Diagnosis Date Noted   Thrombocytopenia ([place]) 10/07/2021   Attention deficit hyperactivity disorder (ADHD), predominantly inattentive type 03/20/2021   Benign prostatic hyperplasia with weak urinary stream 03/19/2021   Encounter for general adult medical examination with abnormal findings 03/18/2021   Elevated coronary artery calcium score 11/06/2020   Insulin-requiring or dependent type II diabetes mellitus ([place]) 08/26/2020   PAF (paroxysmal atrial fibrillation) ([place]) 08/26/2020   Essential hypertension 07/31/2019   Erectile dysfunction due to arterial insufficiency 02/13/2019    Benign prostatic hyperplasia with urinary hesitancy 08/07/2018   Routine general medical examination at a health care facility 01/22/2018   Hyperlipidemia LDL goal <100 01/22/2018   Atrial fibrillation ([place]) 10/25/2016   HNP (herniated nucleus pulposus), lumbar 09/12/2012   Hep C w/ coma, chronic ([place]) 04/03/2007   Erectile dysfunction due to arterial disease ([place]) 07/19/2006   GERD 07/19/2006    Past Surgical History:  Procedure Laterality Date   APPENDECTOMY     ATRIAL FIBRILLATION ABLATION N/A 03/27/2019   Procedure: ATRIAL FIBRILLATION ABLATION;  Surgeon: Constance Haw, MD;  Location: [facility];  Service: Cardiovascular;  Laterality: N/A;   ATRIAL FIBRILLATION ABLATION N/A 06/19/2019   Procedure: ATRIAL FIBRILLATION ABLATION;  Surgeon: Constance Haw, MD;  Location: [facility];  Service: Cardiovascular;  Laterality: N/A;   carotid injury       post tonsillectomy, R carotid injury with trach   HERNIA REPAIR  1973   inguinal- R side   KNEE SURGERY Right    LUMBAR LAMINECTOMY/DECOMPRESSION MICRODISCECTOMY Right 09/12/2012   Procedure: LUMBAR LAMINECTOMY/DECOMPRESSION MICRODISCECTOMY 1 LEVEL;  Surgeon: Charlie Pitter, MD;  Location: [facility];  Service: Neurosurgery;  Laterality: Right;  Right lumbar three-four microdiscectomy   ORIF SHOULDER FRACTURE Right 06/02/2017   Procedure: OPEN REDUCTION INTERNAL FIXATION (ORIF) RIGHT GLENOID FRACTURE, POSSIBLE LATARJET CORACOID TRANSFER;  Surgeon: Meredith Pel, MD;  Location: [facility];  Service: Orthopedics;  Laterality: Right;   TEE  WITHOUT CARDIOVERSION N/A 06/19/2019   Procedure: TRANSESOPHAGEAL ECHOCARDIOGRAM (TEE);  Surgeon: Thayer Headings, MD;  Location: [facility];  Service: Cardiovascular;  Laterality: N/A;   TONSILLECTOMY     TRACHEOSTOMY CLOSURE  0865   UMBILICAL HERNIA REPAIR N/A 12/08/2020   Procedure: HERNIA REPAIR UMBILICAL;  Surgeon: Kinsinger, Arta Bruce, MD;  Location: [facility];  Service:  General;  Laterality: N/A;       Home Medications    Prior to Admission medications   Medication Sig Start Date End Date Taking? Authorizing Provider  albuterol (VENTOLIN HFA) 108 (90 Base) MCG/ACT inhaler Inhale 1-2 puffs into the lungs every 4 (four) hours as needed for wheezing or shortness of breath. 11/18/21  Yes Barrett Henle, MD  alfuzosin (UROXATRAL) 10 MG 24 hr tablet TAKE 1 TABLET(10 MG) BY MOUTH DAILY WITH BREAKFAST 08/28/21  Yes Janith Lima, MD  atorvastatin (LIPITOR) 10 MG tablet TAKE 1 TABLET(10 MG) BY MOUTH DAILY 08/30/21  Yes Janith Lima, MD  BD PEN NEEDLE MICRO U/F 32G X 6 MM MISC USE 2 PEN NEEDLES DAILY 01/25/21  Yes Janith Lima, MD  Continuous Blood Gluc Receiver (FREESTYLE LIBRE 14 DAY READER) DEVI 1 Act by Does not apply route daily. 09/30/21  Yes Janith Lima, MD  Continuous Blood Gluc Sensor (FREESTYLE LIBRE 14 DAY SENSOR) MISC USE AS DIRECTED EVERY 14 DAYS 09/30/21  Yes Janith Lima, MD  dapagliflozin propanediol (FARXIGA) 10 MG TABS tablet Take 1 tablet (10 mg total) by mouth daily before breakfast. 08/30/21  Yes Janith Lima, MD  insulin glargine, 1 Unit Dial, (TOUJEO SOLOSTAR) 300 UNIT/ML Solostar Pen Inject 20 Units into the skin daily. 09/30/21  Yes Janith Lima, MD  lisdexamfetamine (VYVANSE) 30 MG capsule Take 1 capsule (30 mg total) by mouth daily. 10/27/21  Yes Janith Lima, MD  metFORMIN (GLUCOPHAGE) 1000 MG tablet Take 1 tablet (1,000 mg total) by mouth 2 (two) times daily with a meal. 09/30/21  Yes Janith Lima, MD  metoprolol tartrate (LOPRESSOR) 25 MG tablet Take 0.5 tablets (12.5 mg total) by mouth 2 (two) times daily. 08/30/21  Yes Janith Lima, MD  omeprazole (PRILOSEC) 20 MG capsule Take 1 capsule (20 mg total) by mouth daily. 09/30/21  Yes Janith Lima, MD  predniSONE (DELTASONE) 20 MG tablet Take 2 tablets (40 mg total) by mouth daily with breakfast for 5 days. 11/18/21 11/23/21 Yes Brittan Butterbaugh, Gwenlyn Perking, MD  rivaroxaban  (XARELTO) 20 MG TABS tablet TAKE 1 TABLET(20 MG) BY MOUTH DAILY WITH SUPPER 08/30/21  Yes Janith Lima, MD  tadalafil (CIALIS) 20 MG tablet TAKE 1 TABLET BY MOUTH DAILY AS NEEDED FOR E.D. 03/19/21  Yes Janith Lima, MD  insulin aspart (NOVOLOG FLEXPEN) 100 unit/mL SOLN FlexPen Inject subcutaneously (just before each meal) 22-22-20 units 07/11/12 08/28/13  Marletta Lor, MD    Family History Family History  Problem Relation Age of Onset   COPD Mother    Heart attack Father    Diabetes Maternal Aunt    Cancer Paternal Grandmother        colon cancer   Diabetes Maternal Aunt     Social History Social History   Tobacco Use   Smoking status: Former    Years: 34.00    Types: Cigarettes    Quit date: 2007    Years since quitting: 16.7    Passive exposure: Past   Smokeless tobacco: Former    Types: Loss adjuster, chartered  Quit date: 02/15/2007  Vaping Use   Vaping Use: Never used  Substance Use Topics   Alcohol use: Yes    Comment: 3 glasses of wine per week    Drug use: Yes    Types: Marijuana    Comment: gummies occasionally     Allergies   Cephalexin and Edarbi [azilsartan]   Review of Systems Review of Systems  Respiratory:  Positive for shortness of breath.      Physical Exam Triage Vital Signs ED Triage Vitals  Enc Vitals Group     BP 11/18/21 1557 138/76     Pulse Rate 11/18/21 1557 61     Resp 11/18/21 1557 20     Temp 11/18/21 1557 98.2 F (36.8 C)     Temp Source 11/18/21 1557 Oral     SpO2 11/18/21 1557 90 %     Weight --      Height --      Head Circumference --      Peak Flow --      Pain Score 11/18/21 1556 0     Pain Loc --      Pain Edu? --      Excl. in [place]? --    No data found.  Updated Vital Signs BP 138/76 (BP Location: Right Arm)   Pulse 61   Temp 98.2 F (36.8 C) (Oral)   Resp 20   SpO2 90%   Visual Acuity Right Eye Distance:   Left Eye Distance:   Bilateral Distance:    Right Eye Near:   Left Eye Near:    Bilateral Near:      Physical Exam Vitals reviewed.  Constitutional:      General: He is not in acute distress.    Appearance: He is not toxic-appearing.     Comments: He actually is in no acute respiratory distress while talking to him in the exam room.  His O2 sat hovers between 89 and 92 for most of the time he is giving his history.  He is able to speak in complete sentences.  Color is good  HENT:     Right Ear: Tympanic membrane and ear canal normal.     Left Ear: Tympanic membrane and ear canal normal.     Nose: Nose normal.     Mouth/Throat:     Mouth: Mucous membranes are moist.     Pharynx: No oropharyngeal exudate or posterior oropharyngeal erythema.  Eyes:     Extraocular Movements: Extraocular movements intact.     Conjunctiva/sclera: Conjunctivae normal.     Pupils: Pupils are equal, round, and reactive to light.  Cardiovascular:     Rate and Rhythm: Normal rate and regular rhythm.     Heart sounds: No murmur heard.    Comments: Heart rate is 60 Pulmonary:     Effort: Pulmonary effort is normal. No respiratory distress.     Breath sounds: No stridor. No rales.     Comments: Breath sounds are coarse especially on the left side.  Movement reduced a little in the lower lungs bilaterally Musculoskeletal:     Cervical back: Neck supple.     Right lower leg: No edema.     Left lower leg: No edema.  Lymphadenopathy:     Cervical: No cervical adenopathy.  Skin:    Capillary Refill: Capillary refill takes less than 2 seconds.     Coloration: Skin is not jaundiced or pale.  Neurological:     General: No focal  deficit present.     Mental Status: He is alert and oriented to person, place, and time.  Psychiatric:        Behavior: Behavior normal.      UC Treatments / Results  Labs (all labs ordered are listed, but only abnormal results are displayed) Labs Reviewed  RESP PANEL BY RT-PCR (RSV, FLU A&B, COVID)  [id number]    EKG   Radiology DG Chest 2 View  Result Date:  11/18/2021 CLINICAL DATA:  Shortness of breath, cough, wheezing EXAM: CHEST - 2 VIEW COMPARISON:  09/04/2018 FINDINGS: Cardiac size is within normal limits. There is peribronchial thickening. There is no focal pulmonary consolidation. There is no pleural effusion or pneumothorax. Surgical screws are noted in right shoulder. IMPRESSION: Peribronchial thickening suggests bronchitis. There is no focal pulmonary consolidation. There is no pleural effusion or pneumothorax. Electronically Signed   By: Elmer Picker M.D.   On: 11/18/2021 16:38    Procedures Procedures (including critical care time)  Medications Ordered in UC Medications  albuterol (PROVENTIL) (2.5 MG/3ML) 0.083% nebulizer solution 2.5 mg (2.5 mg Nebulization Given 11/18/21 1611)    Initial Impression / Assessment and Plan / UC Course  I have reviewed the triage vital signs and the nursing notes.  Pertinent labs & imaging results that were available during my care of the patient were reviewed by me and considered in my medical decision making (see chart for details).        He is given 1 albuterol treatment here.  Initially he did not see a whole lot of improvement but then after about 30 minutes he felt a good bit better.  Also his O2 sat remained around 90% about 30 minutes after the treatment.  His air movement of his lungs was better after the breathing treatment also Chest x-ray did not show any infiltrate, though it did show some signs of bronchitis.  I discussed with him and we are going to treat for COPD exacerbation with albuterol and prednisone.  He does have diabetes, and he will be good on his diet and drink plenty of fluids  Warnings are given to go to the emergency room if he feels any worse.  He requests RSV testing  Final Clinical Impressions(s) / UC Diagnoses   Final diagnoses:  COPD exacerbation ([place])  Viral upper respiratory tract infection     Discharge Instructions      You are given a  breathing treatment with albuterol  Your chest x-ray did not show any pneumonia or fluid, but did show signs of bronchitis  Albuterol inhaler--do 2 puffs every 4 hours as needed for shortness of breath or wheezing  Take prednisone 20 mg--2 daily for 5 days         ED Prescriptions     Medication Sig Dispense Auth. Provider   albuterol (VENTOLIN HFA) 108 (90 Base) MCG/ACT inhaler Inhale 1-2 puffs into the lungs every 4 (four) hours as needed for wheezing or shortness of breath. 1 each Barrett Henle, MD   predniSONE (DELTASONE) 20 MG tablet Take 2 tablets (40 mg total) by mouth daily with breakfast for 5 days. 10 tablet Windy Carina Gwenlyn Perking, MD      PDMP not reviewed this encounter.   Barrett Henle, MD 11/18/21 1654    Barrett Henle, MD 11/18/21 [phone]

## 2021-11-21 ENCOUNTER — Other Ambulatory Visit: Payer: Self-pay | Admitting: Internal Medicine

## 2021-11-21 DIAGNOSIS — E118 Type 2 diabetes mellitus with unspecified complications: Secondary | ICD-10-CM

## 2021-11-25 ENCOUNTER — Other Ambulatory Visit: Payer: Self-pay | Admitting: Internal Medicine

## 2021-11-25 ENCOUNTER — Encounter: Payer: Self-pay | Admitting: Internal Medicine

## 2021-11-25 ENCOUNTER — Ambulatory Visit (INDEPENDENT_AMBULATORY_CARE_PROVIDER_SITE_OTHER): Payer: 59 | Admitting: Internal Medicine

## 2021-11-25 VITALS — BP 126/64 | HR 54 | Temp 98.0°F | Ht 68.0 in | Wt 168.0 lb

## 2021-11-25 DIAGNOSIS — J22 Unspecified acute lower respiratory infection: Secondary | ICD-10-CM | POA: Insufficient documentation

## 2021-11-25 DIAGNOSIS — F900 Attention-deficit hyperactivity disorder, predominantly inattentive type: Secondary | ICD-10-CM

## 2021-11-25 DIAGNOSIS — E785 Hyperlipidemia, unspecified: Secondary | ICD-10-CM

## 2021-11-25 DIAGNOSIS — E118 Type 2 diabetes mellitus with unspecified complications: Secondary | ICD-10-CM

## 2021-11-25 DIAGNOSIS — I480 Paroxysmal atrial fibrillation: Secondary | ICD-10-CM

## 2021-11-25 MED ORDER — MOXIFLOXACIN HCL 400 MG PO TABS: 400.0000 mg | ORAL_TABLET | Freq: Every day | ORAL | 0 refills | Status: DC

## 2021-11-25 NOTE — Progress Notes (Signed)
Subjective:  Patient ID: Michael Li, male    DOB: [birth year]  Age: 66 y.o. MRN: [id number]  CC: Cough   HPI TUSHAR ENNS presents for f/up -  He complains of a 12-day history of cough that has become productive of green/brown phlegm.  He complains of fatigue and night sweats but denies chest pain, shortness of breath, wheezing, or hemoptysis.  He was recently seen at an urgent care send his chest x-ray was normal.  Outpatient Medications Prior to Visit  Medication Sig Dispense Refill   albuterol (VENTOLIN HFA) 108 (90 Base) MCG/ACT inhaler Inhale 1-2 puffs into the lungs every 4 (four) hours as needed for wheezing or shortness of breath. 1 each 0   alfuzosin (UROXATRAL) 10 MG 24 hr tablet TAKE 1 TABLET(10 MG) BY MOUTH DAILY WITH BREAKFAST 90 tablet 1   atorvastatin (LIPITOR) 10 MG tablet TAKE 1 TABLET(10 MG) BY MOUTH DAILY 90 tablet 0   BD PEN NEEDLE MICRO U/F 32G X 6 MM MISC USE 2 PEN NEEDLES DAILY 200 each 3   Continuous Blood Gluc Receiver (FREESTYLE LIBRE 14 DAY READER) DEVI 1 Act by Does not apply route daily. 2 each 5   Continuous Blood Gluc Sensor (FREESTYLE LIBRE 14 DAY SENSOR) MISC USE AS DIRECTED EVERY 14 DAYS 2 each 5   FARXIGA 10 MG TABS tablet TAKE 1 TABLET(10 MG) BY MOUTH DAILY BEFORE BREAKFAST 90 tablet 0   insulin glargine, 1 Unit Dial, (TOUJEO SOLOSTAR) 300 UNIT/ML Solostar Pen Inject 20 Units into the skin daily. 4.5 mL 1   lisdexamfetamine (VYVANSE) 30 MG capsule Take 1 capsule (30 mg total) by mouth daily. 30 capsule 0   metFORMIN (GLUCOPHAGE) 1000 MG tablet Take 1 tablet (1,000 mg total) by mouth 2 (two) times daily with a meal. 180 tablet 0   metoprolol tartrate (LOPRESSOR) 25 MG tablet Take 0.5 tablets (12.5 mg total) by mouth 2 (two) times daily. 90 tablet 0   omeprazole (PRILOSEC) 20 MG capsule Take 1 capsule (20 mg total) by mouth daily. 90 capsule 1   rivaroxaban (XARELTO) 20 MG TABS tablet TAKE 1 TABLET(20 MG) BY MOUTH DAILY WITH SUPPER 90 tablet 0    tadalafil (CIALIS) 20 MG tablet TAKE 1 TABLET BY MOUTH DAILY AS NEEDED FOR E.D. 10 tablet 5   No facility-administered medications prior to visit.    ROS Review of Systems  Constitutional:  Positive for fatigue. Negative for diaphoresis and unexpected weight change.  Respiratory:  Positive for cough. Negative for chest tightness, shortness of breath and wheezing.   Cardiovascular:  Negative for chest pain, palpitations and leg swelling.  Gastrointestinal:  Negative for abdominal pain, diarrhea, nausea and vomiting.  Endocrine: Negative.   Genitourinary: Negative.  Negative for difficulty urinating.  Musculoskeletal: Negative.   Skin: Negative.   Neurological:  Negative for dizziness and weakness.  Hematological:  Negative for adenopathy. Does not bruise/bleed easily.  Psychiatric/Behavioral: Negative.      Objective:  BP 126/64 (BP Location: Right Arm, Patient Position: Sitting, Cuff Size: Large)   Pulse (!) 54   Temp 98 F (36.7 C) (Oral)   Ht '5\' 8"'$  (1.727 m)   Wt 168 lb (76.2 kg)   SpO2 90%   BMI 25.54 kg/m   BP Readings from Last 3 Encounters:  11/25/21 126/64  11/18/21 138/76  10/04/21 130/76    Wt Readings from Last 3 Encounters:  11/25/21 168 lb (76.2 kg)  10/04/21 171 lb (77.6 kg)  03/18/21 173 lb (78.5  kg)    Physical Exam Vitals reviewed.  Constitutional:      Appearance: He is not ill-appearing.  HENT:     Nose: Nose normal.     Mouth/Throat:     Mouth: Mucous membranes are moist.  Eyes:     General: No scleral icterus.    Conjunctiva/sclera: Conjunctivae normal.  Cardiovascular:     Rate and Rhythm: Normal rate and regular rhythm.     Heart sounds: No murmur heard. Pulmonary:     Effort: Pulmonary effort is normal.     Breath sounds: No stridor. No wheezing, rhonchi or rales.  Abdominal:     General: Abdomen is flat.     Palpations: There is no mass.     Tenderness: There is no abdominal tenderness. There is no guarding.     Hernia: No hernia  is present.  Musculoskeletal:        General: Normal range of motion.     Cervical back: Neck supple.     Right lower leg: No edema.     Left lower leg: No edema.  Lymphadenopathy:     Cervical: No cervical adenopathy.  Skin:    General: Skin is warm and dry.  Neurological:     General: No focal deficit present.     Mental Status: He is alert.     Lab Results  Component Value Date   WBC 5.5 10/04/2021   HGB 14.9 10/04/2021   HCT 42.8 10/04/2021   PLT 142.0 (L) 10/04/2021   GLUCOSE 145 (H) 10/04/2021   CHOL 113 03/18/2021   TRIG 73.0 03/18/2021   HDL 46.10 03/18/2021   LDLCALC 52 03/18/2021   ALT 21 12/01/2020   AST 21 12/01/2020   NA 140 10/04/2021   K 4.3 10/04/2021   CL 101 10/04/2021   CREATININE 0.98 10/04/2021   BUN 19 10/04/2021   CO2 25 10/04/2021   TSH 1.70 10/04/2021   PSA 1.46 03/18/2021   INR 1.0 12/08/2020   HGBA1C 6.9 (H) 10/04/2021   MICROALBUR 1.0 03/18/2021    DG Chest 2 View  Result Date: 11/18/2021 CLINICAL DATA:  Shortness of breath, cough, wheezing EXAM: CHEST - 2 VIEW COMPARISON:  09/04/2018 FINDINGS: Cardiac size is within normal limits. There is peribronchial thickening. There is no focal pulmonary consolidation. There is no pleural effusion or pneumothorax. Surgical screws are noted in right shoulder. IMPRESSION: Peribronchial thickening suggests bronchitis. There is no focal pulmonary consolidation. There is no pleural effusion or pneumothorax. Electronically Signed   By: Elmer Picker M.D.   On: 11/18/2021 16:38    Assessment & Plan:   Briceson was seen today for cough.  Diagnoses and all orders for this visit:  LRTI (lower respiratory tract infection)- Will treat with a fluoroquinolone. -     moxifloxacin (AVELOX) 400 MG tablet; Take 1 tablet (400 mg total) by mouth daily for 7 days.   I am having [name] "Sam" start on moxifloxacin. I am also having him maintain his BD Pen Needle Micro U/F, tadalafil, alfuzosin,  atorvastatin, rivaroxaban, metoprolol tartrate, metFORMIN, omeprazole, Toujeo SoloStar, FreeStyle Libre 14 Day Sensor, [employer] 14 Day Reader, lisdexamfetamine, albuterol, and Farxiga.  Meds ordered this encounter  Medications   moxifloxacin (AVELOX) 400 MG tablet    Sig: Take 1 tablet (400 mg total) by mouth daily for 7 days.    Dispense:  7 tablet    Refill:  0     Follow-up: Return if symptoms worsen or fail  to improve.  Scarlette Calico, MD

## 2021-11-25 NOTE — Patient Instructions (Signed)

## 2021-11-26 MED ORDER — LISDEXAMFETAMINE DIMESYLATE 30 MG PO CAPS: 30.0000 mg | ORAL_CAPSULE | Freq: Every day | ORAL | 0 refills | Status: DC

## 2021-11-26 MED ORDER — ATORVASTATIN CALCIUM 10 MG PO TABS: ORAL_TABLET | ORAL | 0 refills | Status: DC

## 2021-11-26 MED ORDER — RIVAROXABAN 20 MG PO TABS: ORAL_TABLET | ORAL | 0 refills | Status: DC

## 2021-11-26 MED ORDER — METOPROLOL TARTRATE 25 MG PO TABS: 12.5000 mg | ORAL_TABLET | Freq: Two times a day (BID) | ORAL | 0 refills | Status: DC

## 2021-12-02 ENCOUNTER — Ambulatory Visit (INDEPENDENT_AMBULATORY_CARE_PROVIDER_SITE_OTHER): Payer: 59

## 2021-12-02 ENCOUNTER — Encounter: Payer: Self-pay | Admitting: Family Medicine

## 2021-12-02 ENCOUNTER — Other Ambulatory Visit: Payer: Self-pay | Admitting: Family Medicine

## 2021-12-02 ENCOUNTER — Ambulatory Visit (INDEPENDENT_AMBULATORY_CARE_PROVIDER_SITE_OTHER): Payer: 59 | Admitting: Family Medicine

## 2021-12-02 VITALS — BP 104/62 | HR 60 | Temp 97.6°F | Ht 68.0 in | Wt 162.0 lb

## 2021-12-02 DIAGNOSIS — I480 Paroxysmal atrial fibrillation: Secondary | ICD-10-CM | POA: Diagnosis not present

## 2021-12-02 DIAGNOSIS — R61 Generalized hyperhidrosis: Secondary | ICD-10-CM | POA: Diagnosis not present

## 2021-12-02 DIAGNOSIS — J449 Chronic obstructive pulmonary disease, unspecified: Secondary | ICD-10-CM

## 2021-12-02 DIAGNOSIS — R0609 Other forms of dyspnea: Secondary | ICD-10-CM

## 2021-12-02 DIAGNOSIS — E089 Diabetes mellitus due to underlying condition without complications: Secondary | ICD-10-CM

## 2021-12-02 DIAGNOSIS — R5383 Other fatigue: Secondary | ICD-10-CM

## 2021-12-02 DIAGNOSIS — R051 Acute cough: Secondary | ICD-10-CM | POA: Diagnosis not present

## 2021-12-02 LAB — CBC WITH DIFFERENTIAL/PLATELET
Basophils Absolute: 0 10*3/uL (ref 0.0–0.1)
Basophils Relative: 0.2 % (ref 0.0–3.0)
Eosinophils Absolute: 0.1 10*3/uL (ref 0.0–0.7)
Eosinophils Relative: 1.6 % (ref 0.0–5.0)
HCT: 47.8 % (ref 39.0–52.0)
Hemoglobin: 15.9 g/dL (ref 13.0–17.0)
Lymphocytes Relative: 20.7 % (ref 12.0–46.0)
Lymphs Abs: 1.1 10*3/uL (ref 0.7–4.0)
MCHC: 33.3 g/dL (ref 30.0–36.0)
MCV: 86.3 fl (ref 78.0–100.0)
Monocytes Absolute: 0.6 10*3/uL (ref 0.1–1.0)
Monocytes Relative: 11.3 % (ref 3.0–12.0)
Neutro Abs: 3.5 10*3/uL (ref 1.4–7.7)
Neutrophils Relative %: 66.2 % (ref 43.0–77.0)
Platelets: 193 10*3/uL (ref 150.0–400.0)
RBC: 5.54 Mil/uL (ref 4.22–5.81)
RDW: 13.5 % (ref 11.5–15.5)
WBC: 5.4 10*3/uL (ref 4.0–10.5)

## 2021-12-02 LAB — COMPREHENSIVE METABOLIC PANEL
ALT: 24 U/L (ref 0–53)
AST: 27 U/L (ref 0–37)
Albumin: 4.5 g/dL (ref 3.5–5.2)
Alkaline Phosphatase: 81 U/L (ref 39–117)
BUN: 18 mg/dL (ref 6–23)
CO2: 28 mEq/L (ref 19–32)
Calcium: 9.5 mg/dL (ref 8.4–10.5)
Chloride: 100 mEq/L (ref 96–112)
Creatinine, Ser: 1 mg/dL (ref 0.40–1.50)
GFR: 78.35 mL/min (ref >60.00)
Glucose, Bld: 245 mg/dL — ABNORMAL HIGH (ref 70–99)
Potassium: 4.6 mEq/L (ref 3.5–5.1)
Sodium: 137 mEq/L (ref 135–145)
Total Bilirubin: 1 mg/dL (ref 0.2–1.2)
Total Protein: 7.1 g/dL (ref 6.0–8.3)

## 2021-12-02 LAB — VITAMIN D 25 HYDROXY (VIT D DEFICIENCY, FRACTURES): VITD: 26.27 ng/mL — ABNORMAL LOW (ref 30.00–100.00)

## 2021-12-02 LAB — VITAMIN B12: Vitamin B-12: 495 pg/mL (ref 211–911)

## 2021-12-02 LAB — TSH: TSH: 1.98 u[IU]/mL (ref 0.35–5.50)

## 2021-12-02 LAB — BRAIN NATRIURETIC PEPTIDE: Pro B Natriuretic peptide (BNP): 24 pg/mL (ref 0.0–100.0)

## 2021-12-02 LAB — D-DIMER, QUANTITATIVE: D-Dimer, Quant: 0.22 mcg/mL FEU (ref <0.50)

## 2021-12-02 LAB — TROPONIN I (HIGH SENSITIVITY): High Sens Troponin I: 6 ng/L (ref 2–17)

## 2021-12-02 MED ORDER — BREZTRI AEROSPHERE 160-9-4.8 MCG/ACT IN AERO: 2.0000 | INHALATION_SPRAY | Freq: Two times a day (BID) | RESPIRATORY_TRACT | 2 refills | Status: DC

## 2021-12-02 NOTE — Progress Notes (Signed)
Subjective:     Patient ID: Michael Li, male    DOB: February 07, 1956, 66 y.o.   MRN: [id number]  Chief Complaint  Patient presents with   Fatigue    X3 weeks now, saw Dr.Jones last week and was given antibiotics. Still feels so bad and has no energy and overall just feels bad   Cough    HPI Patient is in today for a 3 week hx of fatigue, night sweats, cough with wheeze at night.  DOE.  Denies palpitations or A-fib episodes, uses Kardia mobile.   Denies fever, chills, dizziness, chest pain, abdominal pain, N/V/D, urinary symptoms, LE edema.   Diarrhea intermittent x 6 months.   Wants to sleep all the time. Naps during the day. Nods off at work.   Sleeping well at night.   Neg respiratory panel at UC.   Seen by his PCP one week ago and started on Moxifloxacin. Negative CXR.  He had Covid in lat July 2023.   DM- FBS this morning was 122. No hypoglycemia.     Health Maintenance Due  Topic Date Due   OPHTHALMOLOGY EXAM  05/06/2021    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   Basal cell    Past Surgical History:  Procedure Laterality Date   APPENDECTOMY     ATRIAL FIBRILLATION ABLATION N/A 03/27/2019   Procedure: ATRIAL FIBRILLATION ABLATION;  Surgeon: Constance Haw, MD;  Location: [facility];  Service: Cardiovascular;  Laterality: N/A;   ATRIAL FIBRILLATION ABLATION N/A 06/19/2019   Procedure: ATRIAL FIBRILLATION ABLATION;  Surgeon: Constance Haw, MD;  Location: [facility];  Service: Cardiovascular;  Laterality: N/A;   carotid injury       post tonsillectomy, R carotid injury with trach   HERNIA REPAIR  1973   inguinal- R side   KNEE SURGERY Right    LUMBAR LAMINECTOMY/DECOMPRESSION MICRODISCECTOMY Right 09/12/2012   Procedure: LUMBAR LAMINECTOMY/DECOMPRESSION MICRODISCECTOMY 1 LEVEL;  Surgeon: Charlie Pitter, MD;  Location: [facility];  Service: Neurosurgery;  Laterality: Right;  Right lumbar three-four microdiscectomy   ORIF SHOULDER FRACTURE Right 06/02/2017   Procedure: OPEN REDUCTION INTERNAL FIXATION (ORIF) RIGHT GLENOID FRACTURE, POSSIBLE LATARJET CORACOID TRANSFER;  Surgeon: Meredith Pel, MD;  Location: [facility];  Service: Orthopedics;  Laterality: Right;   TEE WITHOUT CARDIOVERSION N/A 06/19/2019   Procedure: TRANSESOPHAGEAL ECHOCARDIOGRAM (TEE);  Surgeon: Thayer Headings, MD;  Location: [facility];  Service: Cardiovascular;  Laterality: N/A;   TONSILLECTOMY     TRACHEOSTOMY CLOSURE  [id number]   UMBILICAL HERNIA REPAIR N/A 12/08/2020   Procedure: HERNIA REPAIR UMBILICAL;  Surgeon: Kinsinger, Arta Bruce, MD;  Location: [facility];  Service: General;  Laterality: N/A;    Family History  Problem Relation Age of Onset   COPD Mother    Heart attack Father    Diabetes Maternal Aunt    Cancer Paternal Grandmother  colon cancer   Diabetes Maternal Aunt     Social History   Socioeconomic History   Marital status: Married    Spouse name: Not on file   Number of children: 2   Years of education: Not on file   Highest education level: Not on file  Occupational History   Occupation: Best boy: [employer]  Tobacco Use   Smoking status: Former    Years: 34.00    Types: Cigarettes    Quit date: 2007    Years since quitting: 16.8    Passive exposure: Past   Smokeless tobacco: Former    Types: Chew    Quit date: 02/15/2007  Vaping Use   Vaping Use: Never used  Substance and Sexual Activity    Alcohol use: Yes    Comment: 3 glasses of wine per week    Drug use: Yes    Types: Marijuana    Comment: gummies occasionally   Sexual activity: Yes    Partners: Male  Other Topics Concern   Not on file  Social History Narrative   Regular exercise-yes   Social Determinants of Health   Financial Resource Strain: Not on file  Food Insecurity: Not on file  Transportation Needs: Not on file  Physical Activity: Not on file  Stress: Not on file  Social Connections: Not on file  Intimate Partner Violence: Not on file    Outpatient Medications Prior to Visit  Medication Sig Dispense Refill   alfuzosin (UROXATRAL) 10 MG 24 hr tablet TAKE 1 TABLET(10 MG) BY MOUTH DAILY WITH BREAKFAST 90 tablet 1   atorvastatin (LIPITOR) 10 MG tablet TAKE 1 TABLET(10 MG) BY MOUTH DAILY 90 tablet 0   BD PEN NEEDLE MICRO U/F 32G X 6 MM MISC USE 2 PEN NEEDLES DAILY 200 each 3   Continuous Blood Gluc Receiver (FREESTYLE LIBRE 14 DAY READER) DEVI 1 Act by Does not apply route daily. 2 each 5   Continuous Blood Gluc Sensor (FREESTYLE LIBRE 14 DAY SENSOR) MISC USE AS DIRECTED EVERY 14 DAYS 2 each 5   FARXIGA 10 MG TABS tablet TAKE 1 TABLET(10 MG) BY MOUTH DAILY BEFORE BREAKFAST 90 tablet 0   lisdexamfetamine (VYVANSE) 30 MG capsule Take 1 capsule (30 mg total) by mouth daily. 30 capsule 0   metFORMIN (GLUCOPHAGE) 1000 MG tablet Take 1 tablet (1,000 mg total) by mouth 2 (two) times daily with a meal. 180 tablet 0   metoprolol tartrate (LOPRESSOR) 25 MG tablet Take 0.5 tablets (12.5 mg total) by mouth 2 (two) times daily. 90 tablet 0   omeprazole (PRILOSEC) 20 MG capsule Take 1 capsule (20 mg total) by mouth daily. 90 capsule 1   rivaroxaban (XARELTO) 20 MG TABS tablet TAKE 1 TABLET(20 MG) BY MOUTH DAILY WITH SUPPER 90 tablet 0   tadalafil (CIALIS) 20 MG tablet TAKE 1 TABLET BY MOUTH DAILY AS NEEDED FOR E.D. 10 tablet 5   TOUJEO SOLOSTAR 300 UNIT/ML Solostar Pen ADMINISTER 20 UNITS UNDER THE SKIN EVERY MORNING 4.5  mL 1   albuterol (VENTOLIN HFA) 108 (90 Base) MCG/ACT inhaler Inhale 1-2 puffs into the lungs every 4 (four) hours as needed for wheezing or shortness of breath. (Patient not taking: Reported on 12/02/2021) 1 each 0   moxifloxacin (AVELOX) 400 MG tablet Take 1 tablet (400 mg total) by mouth daily for 7 days. (Patient not taking: Reported on 12/02/2021) 7 tablet 0   No facility-administered medications prior to visit.    Allergies  Allergen Reactions   Cephalexin Anaphylaxis, Swelling and Other (See Comments)    Swelling in throat   Edarbi [Azilsartan] Diarrhea    ROS     Objective:    Physical Exam Constitutional:      General: He is not in acute distress.    Appearance: He is not ill-appearing.  Eyes:     Conjunctiva/sclera: Conjunctivae normal.  Cardiovascular:     Rate and Rhythm: Normal rate and regular rhythm.  Pulmonary:     Effort: Pulmonary effort is normal.     Breath sounds: Normal breath sounds.  Musculoskeletal:     Cervical back: Normal range of motion and neck supple.     Right lower leg: No edema.     Left lower leg: No edema.  Skin:    General: Skin is warm and dry.  Neurological:     General: No focal deficit present.     Mental Status: He is alert and oriented to person, place, and time.  Psychiatric:        Mood and Affect: Mood normal.        Behavior: Behavior normal.     BP 104/62 (BP Location: Left Arm, Patient Position: Sitting, Cuff Size: Large)   Pulse 60   Temp 97.6 F (36.4 C) (Temporal)   Ht '5\' 8"'$  (1.727 m)   Wt 162 lb (73.5 kg)   SpO2 94%   BMI 24.63 kg/m  Wt Readings from Last 3 Encounters:  12/02/21 162 lb (73.5 kg)  11/25/21 168 lb (76.2 kg)  10/04/21 171 lb (77.6 kg)       Assessment & Plan:   Problem List Items Addressed This Visit       Cardiovascular and Mediastinum   PAF (paroxysmal atrial fibrillation) (HCC)   Relevant Orders   EKG 12-Lead   DG Chest 2 View (Completed)   Troponin I (High Sensitivity)  (Completed)   Brain natriuretic peptide (Completed)   D-Dimer, Quantitative (Completed)     Other   DOE (dyspnea on exertion) - Primary   Relevant Orders   EKG 12-Lead   DG Chest 2 View (Completed)   Troponin I (High Sensitivity) (Completed)   Brain natriuretic peptide (Completed)   D-Dimer, Quantitative (Completed)   Fatigue   Relevant Orders   Vitamin B12 (Completed)   VITAMIN D 25 Hydroxy (Vit-D Deficiency, Fractures) (Completed)   CBC with Differential/Platelet (Completed)   Comprehensive metabolic panel (Completed)   TSH (Completed)   EKG 12-Lead   DG Chest 2 View (Completed)   Troponin I (High Sensitivity) (Completed)   Brain natriuretic peptide (Completed)   D-Dimer, Quantitative (Completed)   Night sweats   Relevant Orders   CBC with Differential/Platelet (Completed)   Comprehensive metabolic panel (Completed)   TSH (Completed)   DG Chest 2 View (Completed)   Other Visit Diagnoses     Acute cough       Relevant Orders   DG Chest 2 View (Completed)   Diabetes mellitus due to underlying condition, controlled, without complication, without long-term current use of insulin (HCC)       Relevant Orders   CBC with Differential/Platelet (Completed)   Comprehensive metabolic panel (Completed)   Chronic obstructive pulmonary disease, unspecified COPD type (HCC)          EKG shows NSR, rate 69. No significant changes compared to old EKG on 11/12/2020  He is not in any acute distress. Hx of COPD and is not using inhaler. Completed oral steroids and Avelox.  In sinus rhythm today.  Ambulated patient around office and oxygen saturation dropped to 90% and he reports dyspnea.  CXR ordered.  Labs ordered including troponin, BNP, D-dimer since he is at high risk for cardiac event.   Reviewed notes from recent PCP and UC visits as well as results.   CXR shows COPD and bronchitis. Breztri ordered. Called patient with results and recommendations.  Labs unremarkable. Vitamin  D is slightly low. He will increase vitamin D supplement at home.   Visit time 40 minutes in face to face communication with patient and coordination of care, additional 15 minutes spent in record review, coordination or care, ordering tests, communicating/referring to other healthcare professionals, documenting in medical records all on the same day of the visit for total time 55  minutes spent on the visit.    I have discontinued Mikeal Hawthorne T. Cogdell "Sam"'s albuterol and moxifloxacin. I am also having him maintain his BD Pen Needle Micro U/F, tadalafil, alfuzosin, metFORMIN, omeprazole, FreeStyle Libre 14 Day Sensor, [employer] 14 Day Reader, Wilder Glade, [employer], atorvastatin, rivaroxaban, metoprolol tartrate, and lisdexamfetamine.  No orders of the defined types were placed in this encounter.

## 2021-12-02 NOTE — Patient Instructions (Signed)
Please go downstairs for labs and a chest X ray.   We will be in touch with your results.   Let us know if you have any worsening symptoms. If you develop chest pain, palpitations or severe shortness of breath then you will need to go to the emergency department or call 911 but I do not expect this to occur.

## 2021-12-21 ENCOUNTER — Encounter: Payer: Self-pay | Admitting: Internal Medicine

## 2021-12-22 ENCOUNTER — Other Ambulatory Visit: Payer: Self-pay | Admitting: Internal Medicine

## 2021-12-22 DIAGNOSIS — R053 Chronic cough: Secondary | ICD-10-CM | POA: Insufficient documentation

## 2022-01-01 ENCOUNTER — Encounter: Payer: Self-pay | Admitting: Internal Medicine

## 2022-01-03 ENCOUNTER — Other Ambulatory Visit: Payer: Self-pay | Admitting: Internal Medicine

## 2022-01-03 DIAGNOSIS — F900 Attention-deficit hyperactivity disorder, predominantly inattentive type: Secondary | ICD-10-CM

## 2022-01-03 MED ORDER — LISDEXAMFETAMINE DIMESYLATE 30 MG PO CAPS: 30.0000 mg | ORAL_CAPSULE | Freq: Every day | ORAL | 0 refills | Status: DC

## 2022-01-11 ENCOUNTER — Ambulatory Visit (INDEPENDENT_AMBULATORY_CARE_PROVIDER_SITE_OTHER): Payer: 59 | Admitting: Pulmonary Disease

## 2022-01-11 ENCOUNTER — Encounter: Payer: Self-pay | Admitting: Pulmonary Disease

## 2022-01-11 VITALS — BP 116/68 | HR 56 | Ht 68.0 in | Wt 165.0 lb

## 2022-01-11 DIAGNOSIS — Z23 Encounter for immunization: Secondary | ICD-10-CM

## 2022-01-11 DIAGNOSIS — J432 Centrilobular emphysema: Secondary | ICD-10-CM | POA: Diagnosis not present

## 2022-01-11 DIAGNOSIS — R042 Hemoptysis: Secondary | ICD-10-CM | POA: Diagnosis not present

## 2022-01-11 MED ORDER — ALBUTEROL SULFATE HFA 108 (90 BASE) MCG/ACT IN AERS: 2.0000 | INHALATION_SPRAY | Freq: Four times a day (QID) | RESPIRATORY_TRACT | 6 refills | Status: DC | PRN

## 2022-01-11 NOTE — Patient Instructions (Addendum)
You can stop Breztri inhaler and monitor for cough and shortness of breath  Use albuterol inhaler as needed 1-2 puffs every 4-6 hours  We will check pulmonary function tests   We will check CT Chest scan for coughing up blood recently  Follow up in 2 months for pulmonary function tests

## 2022-01-11 NOTE — Progress Notes (Unsigned)
Synopsis: Referred in November 2023 for cough and dyspnea by Scarlette Calico, MD  Subjective:   PATIENT ID: Michael Li GENDER: male DOB: [birth year], MRN: [id number]  HPI  Chief Complaint  Patient presents with   Consult    Referred by PCP for a chronic cough. Was also told that he had COPD.    Michael Li is a 66 year old male, former smoker with GERD, hypertension, atrial fibrillation, hepatitis C status post Harvoni treatment and DMII who is referred to pulmonary clinic for cough and dyspnea.   He was treated for COPD exacerbation in the urgent care on 11/18/21 and was given prednisone and albuterol. He reports coughing up bright red blood mixed in his phlegm 2 weeks ago after having significant emesis episodes due to viral GI illness. He has not had other episodes of hemoptysis. He was started on breztri inhaler recently which has helped his cough and dyspnea but reports it makes him feel light headed or floaty.   He had covid infection in August.  He reports 30 pound weight loss over this year due to recent dental work which has led him to eat less.  He is a former smoker and quit 17 years ago.  He smoked 1 to 1.5 packs/day for 34 years.  His mother had COPD/emphysema and she was a smoker.  He reports significant secondhand smoke exposure in childhood.  He works as a Designer, television/film set.  He denies any seasonal allergies or sinus issues.  He does have GERD.  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   Basal  cell     Family History  Problem Relation Age of Onset   COPD Mother    Heart attack Father    Diabetes Maternal Aunt    Cancer Paternal Grandmother        colon cancer   Diabetes Maternal Aunt      Social History   Socioeconomic History   Marital status: Married    Spouse name: Not on file   Number of children: 2   Years of education: Not on file   Highest education level: Not on file  Occupational History   Occupation: Best boy: [employer]  Tobacco Use   Smoking status: Former    Years: 34.00    Types: Cigarettes    Quit date: 2007    Years since quitting: 16.9    Passive exposure: Past   Smokeless tobacco: Former    Types: Chew    Quit date: 02/15/2007  Vaping Use   Vaping Use: Never used  Substance and Sexual Activity   Alcohol use: Yes    Comment: 3 glasses of wine per week    Drug use: Yes    Types: Marijuana    Comment: gummies occasionally   Sexual activity: Yes    Partners: Male  Other Topics Concern   Not on file  Social History Narrative   Regular  exercise-yes   Social Determinants of Health   Financial Resource Strain: Not on file  Food Insecurity: Not on file  Transportation Needs: Not on file  Physical Activity: Not on file  Stress: Not on file  Social Connections: Not on file  Intimate Partner Violence: Not on file     Allergies  Allergen Reactions   Cephalexin Anaphylaxis, Swelling and Other (See Comments)    Swelling in throat   Edarbi [Azilsartan] Diarrhea     Outpatient Medications Prior to Visit  Medication Sig Dispense Refill   alfuzosin (UROXATRAL) 10 MG 24 hr tablet TAKE 1 TABLET(10 MG) BY MOUTH DAILY WITH BREAKFAST 90 tablet 1   atorvastatin (LIPITOR) 10 MG tablet TAKE 1 TABLET(10 MG) BY MOUTH DAILY 90 tablet 0   BD PEN NEEDLE MICRO U/F 32G X 6 MM MISC USE 2 PEN NEEDLES DAILY 200 each 3   Budeson-Glycopyrrol-Formoterol (BREZTRI AEROSPHERE) 160-9-4.8 MCG/ACT AERO Inhale 2 puffs into the lungs 2  (two) times daily. 10.7 g 2   Continuous Blood Gluc Receiver (FREESTYLE LIBRE 14 DAY READER) DEVI 1 Act by Does not apply route daily. 2 each 5   Continuous Blood Gluc Sensor (FREESTYLE LIBRE 14 DAY SENSOR) MISC USE AS DIRECTED EVERY 14 DAYS 2 each 5   FARXIGA 10 MG TABS tablet TAKE 1 TABLET(10 MG) BY MOUTH DAILY BEFORE BREAKFAST 90 tablet 0   lisdexamfetamine (VYVANSE) 30 MG capsule Take 1 capsule (30 mg total) by mouth daily. 30 capsule 0   metFORMIN (GLUCOPHAGE) 1000 MG tablet Take 1 tablet (1,000 mg total) by mouth 2 (two) times daily with a meal. 180 tablet 0   metoprolol tartrate (LOPRESSOR) 25 MG tablet Take 0.5 tablets (12.5 mg total) by mouth 2 (two) times daily. 90 tablet 0   omeprazole (PRILOSEC) 20 MG capsule Take 1 capsule (20 mg total) by mouth daily. 90 capsule 1   rivaroxaban (XARELTO) 20 MG TABS tablet TAKE 1 TABLET(20 MG) BY MOUTH DAILY WITH SUPPER 90 tablet 0   tadalafil (CIALIS) 20 MG tablet TAKE 1 TABLET BY MOUTH DAILY AS NEEDED FOR E.D. 10 tablet 5   TOUJEO SOLOSTAR 300 UNIT/ML Solostar Pen ADMINISTER 20 UNITS UNDER THE SKIN EVERY MORNING 4.5 mL 1   No facility-administered medications prior to visit.    Review of Systems  Constitutional:  Positive for weight loss. Negative for chills, fever and malaise/fatigue.  HENT:  Negative for congestion, sinus pain and sore throat.   Eyes: Negative.   Respiratory:  Positive for cough, hemoptysis, sputum production and shortness of breath. Negative for wheezing.   Cardiovascular:  Negative for chest pain, palpitations, orthopnea, claudication and leg swelling.  Gastrointestinal:  Negative for abdominal pain, heartburn, nausea and vomiting.  Genitourinary: Negative.   Musculoskeletal:  Negative for joint pain and myalgias.  Skin:  Negative for rash.  Neurological:  Negative for weakness.  Endo/Heme/Allergies: Negative.   Psychiatric/Behavioral: Negative.        Objective:   Vitals:   01/11/22 0843  BP: 116/68  Pulse:  (!) 56  SpO2: 97%  Weight: 165 lb (74.8 kg)  Height: '5\' 8"'$  (1.727 m)     Physical Exam Constitutional:      General: He is not in acute distress. HENT:     Head: Normocephalic and atraumatic.  Eyes:     Extraocular Movements: Extraocular movements intact.     Conjunctiva/sclera: Conjunctivae normal.     Pupils: Pupils are equal, round, and reactive to light.  Cardiovascular:  Rate and Rhythm: Normal rate and regular rhythm.     Pulses: Normal pulses.     Heart sounds: Normal heart sounds. No murmur heard. Pulmonary:     Effort: Pulmonary effort is normal.     Breath sounds: Normal breath sounds.  Abdominal:     General: Bowel sounds are normal.     Palpations: Abdomen is soft.  Musculoskeletal:     Right lower leg: No edema.     Left lower leg: No edema.  Lymphadenopathy:     Cervical: No cervical adenopathy.  Skin:    General: Skin is warm and dry.  Neurological:     General: No focal deficit present.     Mental Status: He is alert.  Psychiatric:        Mood and Affect: Mood normal.        Behavior: Behavior normal.        Thought Content: Thought content normal.        Judgment: Judgment normal.       CBC    Component Value Date/Time   WBC 5.4 12/02/2021 1028   RBC 5.54 12/02/2021 1028   HGB 15.9 12/02/2021 1028   HGB 16.4 03/15/2019 1249   HCT 47.8 12/02/2021 1028   HCT 47.5 03/15/2019 1249   PLT 193.0 12/02/2021 1028   PLT 167 03/15/2019 1249   MCV 86.3 12/02/2021 1028   MCV 87 03/15/2019 1249   MCH 29.5 12/01/2020 0924   MCHC 33.3 12/02/2021 1028   RDW 13.5 12/02/2021 1028   RDW 12.3 03/15/2019 1249   LYMPHSABS 1.1 12/02/2021 1028   MONOABS 0.6 12/02/2021 1028   EOSABS 0.1 12/02/2021 1028   BASOSABS 0.0 12/02/2021 1028      Latest Ref Rng & Units 12/02/2021   10:28 AM 10/04/2021   11:43 AM 03/18/2021    9:27 AM  BMP  Glucose 70 - 99 mg/dL 245  145  134   BUN 6 - 23 mg/dL '18  19  25   '$ Creatinine 0.40 - 1.50 mg/dL 1.00  0.98  0.96    Sodium 135 - 145 mEq/L 137  140  141   Potassium 3.5 - 5.1 mEq/L 4.6  4.3  5.0   Chloride 96 - 112 mEq/L 100  101  105   CO2 19 - 32 mEq/L '28  25  27   '$ Calcium 8.4 - 10.5 mg/dL 9.5  9.4  9.8    Chest imaging: CXR 12/02/21 Cardiac size is within normal limits. Increase in AP diameter of chest suggests COPD. There is mild peribronchial thickening. There are no signs of pulmonary edema or focal pulmonary consolidation. There is no pleural effusion or pneumothorax.  CT Coronary Scan 03/22/19 Vascular: Aortic atherosclerosis. No central pulmonary embolism, on this non-dedicated study.   Mediastinum/Nodes: No mediastinal or hilar adenopathy.   Lungs/Pleura: No pleural fluid.  Mild centrilobular emphysema.  PFT:     No data to display          Labs:  Path:  Echo 11/30/20: LV EF 60%. RV size and systolic function are normal.   Heart Catheterization:    Assessment & Plan:   Centrilobular emphysema ([place]) - Plan: Pulmonary Function Test, albuterol (VENTOLIN HFA) 108 (90 Base) MCG/ACT inhaler  Hemoptysis - Plan: CT Chest Wo Contrast  Need for immunization against influenza - Plan: Flu Vaccine QUAD High Dose(Fluad)  Discussion: Nikolos Billig is a 66 year old male, former smoker with GERD, hypertension, atrial fibrillation, hepatitis C status post  Harvoni treatment and DMII who is referred to pulmonary clinic for cough and dyspnea.   He has mild centrilobular emphysema as noted on his CT coronary scan from 2021 and signs of hyperinflation with peribronchial thickening on chest x-ray from October 2023.  I have instructed him to stop Breztri inhaler due to the side effects of lightheadedness at this time.  He can continue to use albuterol inhaler as needed.  We will check CT chest scan due to his recent episode of hemoptysis and the significant smoking history.  He does not qualify for lung cancer screening as he has quit smoking over 15 years ago.  Follow-up in 2 months with  pulmonary function testing.  Freda Jackson, MD [facility] Office: [phone]   Current Outpatient Medications:    albuterol (VENTOLIN HFA) 108 (90 Base) MCG/ACT inhaler, Inhale 2 puffs into the lungs every 6 (six) hours as needed for wheezing or shortness of breath., Disp: 8 g, Rfl: 6   alfuzosin (UROXATRAL) 10 MG 24 hr tablet, TAKE 1 TABLET(10 MG) BY MOUTH DAILY WITH BREAKFAST, Disp: 90 tablet, Rfl: 1   atorvastatin (LIPITOR) 10 MG tablet, TAKE 1 TABLET(10 MG) BY MOUTH DAILY, Disp: 90 tablet, Rfl: 0   BD PEN NEEDLE MICRO U/F 32G X 6 MM MISC, USE 2 PEN NEEDLES DAILY, Disp: 200 each, Rfl: 3   Budeson-Glycopyrrol-Formoterol (BREZTRI AEROSPHERE) 160-9-4.8 MCG/ACT AERO, Inhale 2 puffs into the lungs 2 (two) times daily., Disp: 10.7 g, Rfl: 2   Continuous Blood Gluc Receiver (FREESTYLE LIBRE 14 DAY READER) DEVI, 1 Act by Does not apply route daily., Disp: 2 each, Rfl: 5   Continuous Blood Gluc Sensor (FREESTYLE LIBRE 14 DAY SENSOR) MISC, USE AS DIRECTED EVERY 14 DAYS, Disp: 2 each, Rfl: 5   FARXIGA 10 MG TABS tablet, TAKE 1 TABLET(10 MG) BY MOUTH DAILY BEFORE BREAKFAST, Disp: 90 tablet, Rfl: 0   lisdexamfetamine (VYVANSE) 30 MG capsule, Take 1 capsule (30 mg total) by mouth daily., Disp: 30 capsule, Rfl: 0   metFORMIN (GLUCOPHAGE) 1000 MG tablet, Take 1 tablet (1,000 mg total) by mouth 2 (two) times daily with a meal., Disp: 180 tablet, Rfl: 0   metoprolol tartrate (LOPRESSOR) 25 MG tablet, Take 0.5 tablets (12.5 mg total) by mouth 2 (two) times daily., Disp: 90 tablet, Rfl: 0   omeprazole (PRILOSEC) 20 MG capsule, Take 1 capsule (20 mg total) by mouth daily., Disp: 90 capsule, Rfl: 1   rivaroxaban (XARELTO) 20 MG TABS tablet, TAKE 1 TABLET(20 MG) BY MOUTH DAILY WITH SUPPER, Disp: 90 tablet, Rfl: 0   tadalafil (CIALIS) 20 MG tablet, TAKE 1 TABLET BY MOUTH DAILY AS NEEDED FOR E.D., Disp: 10 tablet, Rfl: 5   TOUJEO SOLOSTAR 300 UNIT/ML Solostar Pen, ADMINISTER 20 UNITS UNDER  THE SKIN EVERY MORNING, Disp: 4.5 mL, Rfl: 1

## 2022-01-13 ENCOUNTER — Encounter: Payer: Self-pay | Admitting: Pulmonary Disease

## 2022-01-13 ENCOUNTER — Telehealth: Payer: Self-pay

## 2022-01-13 NOTE — Telephone Encounter (Signed)
Key: [id number]  Approved 12/14/2021-01/13/2023

## 2022-01-18 ENCOUNTER — Other Ambulatory Visit: Payer: Self-pay | Admitting: Internal Medicine

## 2022-01-18 DIAGNOSIS — E118 Type 2 diabetes mellitus with unspecified complications: Secondary | ICD-10-CM

## 2022-01-26 ENCOUNTER — Other Ambulatory Visit: Payer: Self-pay | Admitting: Internal Medicine

## 2022-01-26 DIAGNOSIS — F900 Attention-deficit hyperactivity disorder, predominantly inattentive type: Secondary | ICD-10-CM

## 2022-01-27 ENCOUNTER — Other Ambulatory Visit: Payer: Self-pay | Admitting: Internal Medicine

## 2022-01-27 ENCOUNTER — Encounter: Payer: Self-pay | Admitting: Internal Medicine

## 2022-01-27 DIAGNOSIS — I480 Paroxysmal atrial fibrillation: Secondary | ICD-10-CM

## 2022-01-27 DIAGNOSIS — E118 Type 2 diabetes mellitus with unspecified complications: Secondary | ICD-10-CM

## 2022-01-27 DIAGNOSIS — F900 Attention-deficit hyperactivity disorder, predominantly inattentive type: Secondary | ICD-10-CM

## 2022-01-27 DIAGNOSIS — E119 Type 2 diabetes mellitus without complications: Secondary | ICD-10-CM

## 2022-01-27 DIAGNOSIS — N5201 Erectile dysfunction due to arterial insufficiency: Secondary | ICD-10-CM

## 2022-01-27 MED ORDER — TOUJEO SOLOSTAR 300 UNIT/ML ~~LOC~~ SOPN: PEN_INJECTOR | SUBCUTANEOUS | 1 refills | Status: DC

## 2022-01-27 MED ORDER — LISDEXAMFETAMINE DIMESYLATE 30 MG PO CAPS: 30.0000 mg | ORAL_CAPSULE | Freq: Every day | ORAL | 0 refills | Status: DC

## 2022-01-27 MED ORDER — TADALAFIL 20 MG PO TABS: ORAL_TABLET | ORAL | 5 refills | Status: DC

## 2022-01-27 MED ORDER — FREESTYLE LIBRE 14 DAY SENSOR MISC: 5 refills | Status: DC

## 2022-02-10 ENCOUNTER — Ambulatory Visit
Admission: RE | Admit: 2022-02-10 | Discharge: 2022-02-10 | Disposition: A | Discharge status: Home / Self Care | Payer: 59 | Source: Ambulatory Visit | Attending: Pulmonary Disease | Admitting: Pulmonary Disease

## 2022-02-10 DIAGNOSIS — R042 Hemoptysis: Secondary | ICD-10-CM

## 2022-02-15 LAB — HM DIABETES EYE EXAM

## 2022-02-27 ENCOUNTER — Other Ambulatory Visit: Payer: Self-pay | Admitting: Internal Medicine

## 2022-02-27 DIAGNOSIS — N401 Enlarged prostate with lower urinary tract symptoms: Secondary | ICD-10-CM

## 2022-02-27 DIAGNOSIS — F900 Attention-deficit hyperactivity disorder, predominantly inattentive type: Secondary | ICD-10-CM

## 2022-02-28 MED ORDER — LISDEXAMFETAMINE DIMESYLATE 30 MG PO CAPS: 30.0000 mg | ORAL_CAPSULE | Freq: Every day | ORAL | 0 refills | Status: DC

## 2022-03-21 ENCOUNTER — Encounter: Payer: Self-pay | Admitting: Pulmonary Disease

## 2022-03-21 ENCOUNTER — Ambulatory Visit (INDEPENDENT_AMBULATORY_CARE_PROVIDER_SITE_OTHER): Payer: 59 | Admitting: Pulmonary Disease

## 2022-03-21 VITALS — BP 118/64 | HR 56 | Ht 69.0 in | Wt 171.0 lb

## 2022-03-21 DIAGNOSIS — J432 Centrilobular emphysema: Secondary | ICD-10-CM

## 2022-03-21 DIAGNOSIS — R911 Solitary pulmonary nodule: Secondary | ICD-10-CM | POA: Diagnosis not present

## 2022-03-21 LAB — PULMONARY FUNCTION TEST
DL/VA % pred: 58 %
DL/VA: 2.42 ml/min/mmHg/L
DLCO cor % pred: 57 %
DLCO cor: 14.72 ml/min/mmHg
DLCO unc % pred: 57 %
DLCO unc: 14.72 ml/min/mmHg
FEF 25-75 Post: 1.83 L/sec
FEF 25-75 Pre: 1.96 L/sec
FEF2575-%Change-Post: -6 %
FEF2575-%Pred-Post: 70 %
FEF2575-%Pred-Pre: 75 %
FEV1-%Change-Post: -12 %
FEV1-%Pred-Post: 71 %
FEV1-%Pred-Pre: 81 %
FEV1-Post: 2.33 L
FEV1-Pre: 2.67 L
FEV1FVC-%Change-Post: -13 %
FEV1FVC-%Pred-Pre: 96 %
FEV6-%Change-Post: -1 %
FEV6-%Pred-Post: 86 %
FEV6-%Pred-Pre: 87 %
FEV6-Post: 3.62 L
FEV6-Pre: 3.66 L
FEV6FVC-%Change-Post: 1 %
FEV6FVC-%Pred-Post: 105 %
FEV6FVC-%Pred-Pre: 103 %
FVC-%Change-Post: 0 %
FVC-%Pred-Post: 84 %
FVC-%Pred-Pre: 84 %
FVC-Post: 3.74 L
FVC-Pre: 3.72 L
Post FEV1/FVC ratio: 62 %
Post FEV6/FVC ratio: 100 %
Pre FEV1/FVC ratio: 72 %
Pre FEV6/FVC Ratio: 98 %
RV % pred: 131 %
RV: 3.04 L
TLC % pred: 100 %
TLC: 6.81 L

## 2022-03-21 NOTE — Progress Notes (Unsigned)
Synopsis: Referred in November 2023 for cough and dyspnea by Scarlette Calico, MD  Subjective:   PATIENT ID: Michael Li GENDER: male DOB: [birth year], MRN: [id number]  HPI  Chief Complaint  Patient presents with   Follow-up    F/U after PFT. States his SOB has not changed since last visit.    Michael Li is a 67 year old male, former smoker with GERD, hypertension, atrial fibrillation, hepatitis C status post Harvoni treatment and DMII who returns to pulmonary clinic for centrilobular emphysema.   PFTs today show moderate obstruction with air trapping and mild diffusion defect.   Initial OV 01/11/22 He was treated for COPD exacerbation in the urgent care on 11/18/21 and was given prednisone and albuterol. He reports coughing up bright red blood mixed in his phlegm 2 weeks ago after having significant emesis episodes due to viral GI illness. He has not had other episodes of hemoptysis. He was started on breztri inhaler recently which has helped his cough and dyspnea but reports it makes him feel light headed or floaty.   He had covid infection in August.  He reports 30 pound weight loss over this year due to recent dental work which has led him to eat less.  He is a former smoker and quit 17 years ago.  He smoked 1 to 1.5 packs/day for 34 years.  His mother had COPD/emphysema and she was a smoker.  He reports significant secondhand smoke exposure in childhood.  He works as a Designer, television/film set.  He denies any seasonal allergies or sinus issues.  He does have GERD.  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/2009   Basal cell     Family History  Problem Relation Age of Onset   COPD Mother    Heart attack Father    Diabetes Maternal Aunt    Cancer Paternal Grandmother        colon cancer   Diabetes Maternal Aunt      Social History   Socioeconomic History   Marital status: Married    Spouse name: Not on file   Number of children: 2   Years of education: Not on file   Highest education level: Not on file  Occupational History   Occupation: Best boy: [employer]  Tobacco Use   Smoking status: Former    Years: 34.00    Types: Cigarettes    Quit date: 2007    Years since quitting: 17.1    Passive exposure: Past   Smokeless tobacco: Former    Types: Chew    Quit date: 02/15/2007  Vaping Use   Vaping Use: Never used  Substance and Sexual Activity   Alcohol use: Yes    Comment: 3 glasses of wine per week    Drug use: Yes    Types: Marijuana    Comment: gummies occasionally   Sexual activity: Yes    Partners: Male  Other Topics  Concern   Not on file  Social History Narrative   Regular exercise-yes   Social Determinants of Health   Financial Resource Strain: Not on file  Food Insecurity: Not on file  Transportation Needs: Not on file  Physical Activity: Not on file  Stress: Not on file  Social Connections: Not on file  Intimate Partner Violence: Not on file     Allergies  Allergen Reactions   Cephalexin Anaphylaxis, Swelling and Other (See Comments)    Swelling in throat   Edarbi [Azilsartan] Diarrhea     Outpatient Medications Prior to Visit  Medication Sig Dispense Refill   albuterol (VENTOLIN HFA) 108 (90 Base) MCG/ACT inhaler Inhale 2 puffs into the lungs every 6 (six) hours as needed for wheezing or shortness of breath. 8 g 6   alfuzosin (UROXATRAL) 10 MG 24 hr tablet TAKE 1 TABLET(10 MG) BY MOUTH DAILY WITH BREAKFAST 90 tablet 0    atorvastatin (LIPITOR) 10 MG tablet TAKE 1 TABLET(10 MG) BY MOUTH DAILY 90 tablet 0   BD PEN NEEDLE MICRO U/F 32G X 6 MM MISC USE 2 PEN NEEDLES DAILY 200 each 0   Continuous Blood Gluc Receiver (FREESTYLE LIBRE 14 DAY READER) DEVI 1 Act by Does not apply route daily. 2 each 5   Continuous Blood Gluc Sensor (FREESTYLE LIBRE 14 DAY SENSOR) MISC USE AS DIRECTED EVERY 14 DAYS 2 each 5   FARXIGA 10 MG TABS tablet TAKE 1 TABLET(10 MG) BY MOUTH DAILY BEFORE BREAKFAST 90 tablet 0   insulin glargine, 1 Unit Dial, (TOUJEO SOLOSTAR) 300 UNIT/ML Solostar Pen ADMINISTER 20 UNITS UNDER THE SKIN EVERY MORNING 4.5 mL 1   lisdexamfetamine (VYVANSE) 30 MG capsule Take 1 capsule (30 mg total) by mouth daily. 30 capsule 0   metFORMIN (GLUCOPHAGE) 1000 MG tablet Take 1 tablet (1,000 mg total) by mouth 2 (two) times daily with a meal. 180 tablet 0   metoprolol tartrate (LOPRESSOR) 25 MG tablet Take 0.5 tablets (12.5 mg total) by mouth 2 (two) times daily. 90 tablet 0   omeprazole (PRILOSEC) 20 MG capsule Take 1 capsule (20 mg total) by mouth daily. 90 capsule 1   rivaroxaban (XARELTO) 20 MG TABS tablet TAKE 1 TABLET(20 MG) BY MOUTH DAILY WITH SUPPER 90 tablet 0   tadalafil (CIALIS) 20 MG tablet TAKE 1 TABLET BY MOUTH DAILY AS NEEDED FOR E.D. 10 tablet 5   Budeson-Glycopyrrol-Formoterol (BREZTRI AEROSPHERE) 160-9-4.8 MCG/ACT AERO Inhale 2 puffs into the lungs 2 (two) times daily. 10.7 g 2   No facility-administered medications prior to visit.   Review of Systems  Constitutional:  Positive for weight loss. Negative for chills, fever and malaise/fatigue.  HENT:  Negative for congestion, sinus pain and sore throat.   Eyes: Negative.   Respiratory:  Positive for cough, hemoptysis, sputum production and shortness of breath. Negative for wheezing.   Cardiovascular:  Negative for chest pain, palpitations, orthopnea, claudication and leg swelling.  Gastrointestinal:  Negative for abdominal pain, heartburn, nausea and  vomiting.  Genitourinary: Negative.   Musculoskeletal:  Negative for joint pain and myalgias.  Skin:  Negative for rash.  Neurological:  Negative for weakness.  Endo/Heme/Allergies: Negative.   Psychiatric/Behavioral: Negative.     Objective:   Vitals:   03/21/22 1307  BP: 118/64  Pulse: (!) 56  SpO2: 95%  Weight: 171 lb (77.6 kg)  Height: '5\' 9"'$  (1.753 m)   Physical Exam Constitutional:      General: He is not in acute distress. HENT:  Head: Normocephalic and atraumatic.  Eyes:     Extraocular Movements: Extraocular movements intact.     Conjunctiva/sclera: Conjunctivae normal.     Pupils: Pupils are equal, round, and reactive to light.  Cardiovascular:     Rate and Rhythm: Normal rate and regular rhythm.     Pulses: Normal pulses.     Heart sounds: Normal heart sounds. No murmur heard. Pulmonary:     Effort: Pulmonary effort is normal.     Breath sounds: Normal breath sounds.  Abdominal:     General: Bowel sounds are normal.     Palpations: Abdomen is soft.  Musculoskeletal:     Right lower leg: No edema.     Left lower leg: No edema.  Lymphadenopathy:     Cervical: No cervical adenopathy.  Skin:    General: Skin is warm and dry.  Neurological:     General: No focal deficit present.     Mental Status: He is alert.  Psychiatric:        Mood and Affect: Mood normal.        Behavior: Behavior normal.        Thought Content: Thought content normal.        Judgment: Judgment normal.    CBC    Component Value Date/Time   WBC 5.4 12/02/2021 1028   RBC 5.54 12/02/2021 1028   HGB 15.9 12/02/2021 1028   HGB 16.4 03/15/2019 1249   HCT 47.8 12/02/2021 1028   HCT 47.5 03/15/2019 1249   PLT 193.0 12/02/2021 1028   PLT 167 03/15/2019 1249   MCV 86.3 12/02/2021 1028   MCV 87 03/15/2019 1249   MCH 29.5 12/01/2020 0924   MCHC 33.3 12/02/2021 1028   RDW 13.5 12/02/2021 1028   RDW 12.3 03/15/2019 1249   LYMPHSABS 1.1 12/02/2021 1028   MONOABS 0.6 12/02/2021  1028   EOSABS 0.1 12/02/2021 1028   BASOSABS 0.0 12/02/2021 1028      Latest Ref Rng & Units 12/02/2021   10:28 AM 10/04/2021   11:43 AM 03/18/2021    9:27 AM  BMP  Glucose 70 - 99 mg/dL 245  145  134   BUN 6 - 23 mg/dL '18  19  25   '$ Creatinine 0.40 - 1.50 mg/dL 1.00  0.98  0.96   Sodium 135 - 145 mEq/L 137  140  141   Potassium 3.5 - 5.1 mEq/L 4.6  4.3  5.0   Chloride 96 - 112 mEq/L 100  101  105   CO2 19 - 32 mEq/L '28  25  27   '$ Calcium 8.4 - 10.5 mg/dL 9.5  9.4  9.8    Chest imaging: CT Chest 02/10/22 1. Emphysema with interval increase in bronchial thickening in the lower lobes. 2. Coarse reticular markings in the lingular base, appearance suggests either post pneumonic scarring or atelectasis rather than an acute pneumonic process. 3. 6 mm irregular nodule in the left upper lobe with stranding to the pleural surface. Per Fleischner guidelines, a follow-up CT is recommended in 6-12 months, and if stable at follow-up, a further follow-up study at 18-24 months from today's study. These guidelines do not apply to cancer patients or immunocompromised patients. For lung cancer screening, adhere to Lung-RADS guidelines. 4. Slightly prominent mediastinal nodes. 5. Aortic and coronary artery atherosclerosis. 6. Small hiatal hernia with probable reflux esophagitis. 7. Cirrhotic liver with mild splenomegaly and a prominent hepatic portal vein. 8. Chronic gastric thickening which could be due to gastritis or  portal gastropathy.  CXR 12/02/21 Cardiac size is within normal limits. Increase in AP diameter of chest suggests COPD. There is mild peribronchial thickening. There are no signs of pulmonary edema or focal pulmonary consolidation. There is no pleural effusion or pneumothorax.  CT Coronary Scan 03/22/19 Vascular: Aortic atherosclerosis. No central pulmonary embolism, on this non-dedicated study.   Mediastinum/Nodes: No mediastinal or hilar adenopathy.   Lungs/Pleura: No  pleural fluid.  Mild centrilobular emphysema.  PFT:    Latest Ref Rng & Units 03/21/2022   11:47 AM  PFT Results  FVC-Pre L 3.72  P  FVC-Predicted Pre % 84  P  FVC-Post L 3.74  P  FVC-Predicted Post % 84  P  Pre FEV1/FVC % % 72  P  Post FEV1/FCV % % 62  P  FEV1-Pre L 2.67  P  FEV1-Predicted Pre % 81  P  FEV1-Post L 2.33  P  DLCO uncorrected ml/min/mmHg 14.72  P  DLCO UNC% % 57  P  DLCO corrected ml/min/mmHg 14.72  P  DLCO COR %Predicted % 57  P  DLVA Predicted % 58  P  TLC L 6.81  P  TLC % Predicted % 100  P  RV % Predicted % 131  P    P Preliminary result    Labs:  Path:  Echo 11/30/20: LV EF 60%. RV size and systolic function are normal.   Heart Catheterization:    Assessment & Plan:   Centrilobular emphysema ([place])  Pulmonary nodule - Plan: CT Chest Wo Contrast  Discussion: Michael Li is a 67 year old male, former smoker with GERD, hypertension, atrial fibrillation, hepatitis C status post Harvoni treatment and DMII who is referred to pulmonary clinic for cough and dyspnea.   He has mild centrilobular emphysema as noted on his CT coronary scan from 2021 and signs of hyperinflation with peribronchial thickening on chest x-ray from October 2023.  I have instructed him to stop Breztri inhaler due to the side effects of lightheadedness at this time.  He can continue to use albuterol inhaler as needed.  We will check CT chest scan due to his recent episode of hemoptysis and the significant smoking history.  He does not qualify for lung cancer screening as he has quit smoking over 15 years ago.  Follow-up in 2 months with pulmonary function testing.  Freda Jackson, MD [facility] Office: [phone]   Current Outpatient Medications:    albuterol (VENTOLIN HFA) 108 (90 Base) MCG/ACT inhaler, Inhale 2 puffs into the lungs every 6 (six) hours as needed for wheezing or shortness of breath., Disp: 8 g, Rfl: 6   alfuzosin (UROXATRAL) 10 MG 24  hr tablet, TAKE 1 TABLET(10 MG) BY MOUTH DAILY WITH BREAKFAST, Disp: 90 tablet, Rfl: 0   atorvastatin (LIPITOR) 10 MG tablet, TAKE 1 TABLET(10 MG) BY MOUTH DAILY, Disp: 90 tablet, Rfl: 0   BD PEN NEEDLE MICRO U/F 32G X 6 MM MISC, USE 2 PEN NEEDLES DAILY, Disp: 200 each, Rfl: 0   Continuous Blood Gluc Receiver (FREESTYLE LIBRE 14 DAY READER) DEVI, 1 Act by Does not apply route daily., Disp: 2 each, Rfl: 5   Continuous Blood Gluc Sensor (FREESTYLE LIBRE 14 DAY SENSOR) MISC, USE AS DIRECTED EVERY 14 DAYS, Disp: 2 each, Rfl: 5   FARXIGA 10 MG TABS tablet, TAKE 1 TABLET(10 MG) BY MOUTH DAILY BEFORE BREAKFAST, Disp: 90 tablet, Rfl: 0   insulin glargine, 1 Unit Dial, (TOUJEO SOLOSTAR) 300 UNIT/ML Solostar Pen, ADMINISTER  20 UNITS UNDER THE SKIN EVERY MORNING, Disp: 4.5 mL, Rfl: 1   lisdexamfetamine (VYVANSE) 30 MG capsule, Take 1 capsule (30 mg total) by mouth daily., Disp: 30 capsule, Rfl: 0   metFORMIN (GLUCOPHAGE) 1000 MG tablet, Take 1 tablet (1,000 mg total) by mouth 2 (two) times daily with a meal., Disp: 180 tablet, Rfl: 0   metoprolol tartrate (LOPRESSOR) 25 MG tablet, Take 0.5 tablets (12.5 mg total) by mouth 2 (two) times daily., Disp: 90 tablet, Rfl: 0   omeprazole (PRILOSEC) 20 MG capsule, Take 1 capsule (20 mg total) by mouth daily., Disp: 90 capsule, Rfl: 1   rivaroxaban (XARELTO) 20 MG TABS tablet, TAKE 1 TABLET(20 MG) BY MOUTH DAILY WITH SUPPER, Disp: 90 tablet, Rfl: 0   tadalafil (CIALIS) 20 MG tablet, TAKE 1 TABLET BY MOUTH DAILY AS NEEDED FOR E.D., Disp: 10 tablet, Rfl: 5

## 2022-03-21 NOTE — Progress Notes (Signed)
Full PFT completed today 

## 2022-03-21 NOTE — Patient Instructions (Signed)
Continue to use albuterol inhaler 1-2 puffs every 4-6 hours as needed.   Consider using the albuterol prior to exertional activity like yard work, Social research officer, government.   Your PFTs show mild obstructive defect and mild diffusion defect.   We will continue to monitor your breathing symptoms and consider different maintenance inhalers.  Follow up in December after CT Chest scan to follow your lung nodule.

## 2022-03-23 ENCOUNTER — Encounter: Payer: Self-pay | Admitting: Pulmonary Disease

## 2022-03-24 ENCOUNTER — Encounter (HOSPITAL_COMMUNITY): Payer: Self-pay | Admitting: *Deleted

## 2022-03-31 ENCOUNTER — Other Ambulatory Visit: Payer: Self-pay | Admitting: Internal Medicine

## 2022-03-31 DIAGNOSIS — I480 Paroxysmal atrial fibrillation: Secondary | ICD-10-CM

## 2022-03-31 DIAGNOSIS — E118 Type 2 diabetes mellitus with unspecified complications: Secondary | ICD-10-CM

## 2022-03-31 DIAGNOSIS — F900 Attention-deficit hyperactivity disorder, predominantly inattentive type: Secondary | ICD-10-CM

## 2022-03-31 DIAGNOSIS — N401 Enlarged prostate with lower urinary tract symptoms: Secondary | ICD-10-CM

## 2022-03-31 MED ORDER — RIVAROXABAN 20 MG PO TABS: ORAL_TABLET | ORAL | 0 refills | Status: DC

## 2022-03-31 MED ORDER — DAPAGLIFLOZIN PROPANEDIOL 10 MG PO TABS: 10.0000 mg | ORAL_TABLET | Freq: Every day | ORAL | 0 refills | Status: DC

## 2022-03-31 MED ORDER — METOPROLOL TARTRATE 25 MG PO TABS: 12.5000 mg | ORAL_TABLET | Freq: Two times a day (BID) | ORAL | 0 refills | Status: DC

## 2022-03-31 MED ORDER — ALFUZOSIN HCL ER 10 MG PO TB24: ORAL_TABLET | ORAL | 0 refills | Status: DC

## 2022-04-03 ENCOUNTER — Encounter: Payer: Self-pay | Admitting: Internal Medicine

## 2022-04-03 ENCOUNTER — Other Ambulatory Visit: Payer: Self-pay | Admitting: Internal Medicine

## 2022-04-03 DIAGNOSIS — E118 Type 2 diabetes mellitus with unspecified complications: Secondary | ICD-10-CM

## 2022-04-03 DIAGNOSIS — F900 Attention-deficit hyperactivity disorder, predominantly inattentive type: Secondary | ICD-10-CM

## 2022-04-05 MED ORDER — FARXIGA 10 MG PO TABS: 10.0000 mg | ORAL_TABLET | Freq: Every day | ORAL | 0 refills | Status: DC

## 2022-04-05 MED ORDER — LISDEXAMFETAMINE DIMESYLATE 30 MG PO CAPS: 30.0000 mg | ORAL_CAPSULE | Freq: Every day | ORAL | 0 refills | Status: DC

## 2022-04-11 ENCOUNTER — Other Ambulatory Visit: Payer: Self-pay | Admitting: Internal Medicine

## 2022-04-11 DIAGNOSIS — K219 Gastro-esophageal reflux disease without esophagitis: Secondary | ICD-10-CM

## 2022-04-18 ENCOUNTER — Other Ambulatory Visit: Payer: Self-pay | Admitting: Internal Medicine

## 2022-04-18 DIAGNOSIS — E118 Type 2 diabetes mellitus with unspecified complications: Secondary | ICD-10-CM

## 2022-05-04 ENCOUNTER — Encounter: Payer: Self-pay | Admitting: Internal Medicine

## 2022-05-04 ENCOUNTER — Other Ambulatory Visit: Payer: Self-pay | Admitting: Internal Medicine

## 2022-05-04 DIAGNOSIS — N401 Enlarged prostate with lower urinary tract symptoms: Secondary | ICD-10-CM

## 2022-05-04 DIAGNOSIS — E118 Type 2 diabetes mellitus with unspecified complications: Secondary | ICD-10-CM

## 2022-05-04 DIAGNOSIS — I480 Paroxysmal atrial fibrillation: Secondary | ICD-10-CM

## 2022-05-04 DIAGNOSIS — F900 Attention-deficit hyperactivity disorder, predominantly inattentive type: Secondary | ICD-10-CM

## 2022-05-04 DIAGNOSIS — E785 Hyperlipidemia, unspecified: Secondary | ICD-10-CM

## 2022-05-09 ENCOUNTER — Encounter: Payer: Self-pay | Admitting: Internal Medicine

## 2022-05-09 ENCOUNTER — Ambulatory Visit (INDEPENDENT_AMBULATORY_CARE_PROVIDER_SITE_OTHER): Payer: 59 | Admitting: Internal Medicine

## 2022-05-09 VITALS — BP 120/62 | HR 54 | Temp 97.8°F | Ht 69.0 in | Wt 169.0 lb

## 2022-05-09 DIAGNOSIS — I10 Essential (primary) hypertension: Secondary | ICD-10-CM | POA: Diagnosis not present

## 2022-05-09 DIAGNOSIS — N401 Enlarged prostate with lower urinary tract symptoms: Secondary | ICD-10-CM

## 2022-05-09 DIAGNOSIS — Z794 Long term (current) use of insulin: Secondary | ICD-10-CM

## 2022-05-09 DIAGNOSIS — E118 Type 2 diabetes mellitus with unspecified complications: Secondary | ICD-10-CM | POA: Diagnosis not present

## 2022-05-09 DIAGNOSIS — K3189 Other diseases of stomach and duodenum: Secondary | ICD-10-CM

## 2022-05-09 DIAGNOSIS — N5201 Erectile dysfunction due to arterial insufficiency: Secondary | ICD-10-CM

## 2022-05-09 DIAGNOSIS — R3912 Poor urinary stream: Secondary | ICD-10-CM

## 2022-05-09 DIAGNOSIS — E785 Hyperlipidemia, unspecified: Secondary | ICD-10-CM | POA: Diagnosis not present

## 2022-05-09 DIAGNOSIS — R3911 Hesitancy of micturition: Secondary | ICD-10-CM | POA: Diagnosis not present

## 2022-05-09 DIAGNOSIS — Z0001 Encounter for general adult medical examination with abnormal findings: Secondary | ICD-10-CM

## 2022-05-09 DIAGNOSIS — E119 Type 2 diabetes mellitus without complications: Secondary | ICD-10-CM

## 2022-05-09 DIAGNOSIS — F900 Attention-deficit hyperactivity disorder, predominantly inattentive type: Secondary | ICD-10-CM

## 2022-05-09 DIAGNOSIS — I480 Paroxysmal atrial fibrillation: Secondary | ICD-10-CM

## 2022-05-09 DIAGNOSIS — K219 Gastro-esophageal reflux disease without esophagitis: Secondary | ICD-10-CM

## 2022-05-09 LAB — MICROALBUMIN / CREATININE URINE RATIO
Creatinine,U: 55.1 mg/dL
Microalb Creat Ratio: 1.3 mg/g (ref 0.0–30.0)
Microalb, Ur: <0.7 mg/dL (ref 0.0–1.9)

## 2022-05-09 LAB — LIPID PANEL
Cholesterol: 110 mg/dL (ref 0–200)
HDL: 38.6 mg/dL — ABNORMAL LOW (ref >39.00)
NonHDL: 71.56
Total CHOL/HDL Ratio: 3
Triglycerides: 212 mg/dL — ABNORMAL HIGH (ref 0.0–149.0)
VLDL: 42.4 mg/dL — ABNORMAL HIGH (ref 0.0–40.0)

## 2022-05-09 LAB — LDL CHOLESTEROL, DIRECT: Direct LDL: 58 mg/dL

## 2022-05-09 LAB — HEMOGLOBIN A1C: Hgb A1c MFr Bld: 6.8 % — ABNORMAL HIGH (ref 4.6–6.5)

## 2022-05-09 MED ORDER — ALFUZOSIN HCL ER 10 MG PO TB24: ORAL_TABLET | ORAL | 0 refills | Status: DC

## 2022-05-09 MED ORDER — OMEPRAZOLE 20 MG PO CPDR: 20.0000 mg | DELAYED_RELEASE_CAPSULE | Freq: Every day | ORAL | 1 refills | Status: DC

## 2022-05-09 MED ORDER — LISDEXAMFETAMINE DIMESYLATE 30 MG PO CAPS: 30.0000 mg | ORAL_CAPSULE | Freq: Every day | ORAL | 0 refills | Status: DC

## 2022-05-09 MED ORDER — TADALAFIL 20 MG PO TABS: ORAL_TABLET | ORAL | 5 refills | Status: DC

## 2022-05-09 MED ORDER — METFORMIN HCL 1000 MG PO TABS: 1000.0000 mg | ORAL_TABLET | Freq: Two times a day (BID) | ORAL | 0 refills | Status: DC

## 2022-05-09 MED ORDER — FARXIGA 10 MG PO TABS: 10.0000 mg | ORAL_TABLET | Freq: Every day | ORAL | 0 refills | Status: DC

## 2022-05-09 MED ORDER — RIVAROXABAN 20 MG PO TABS: ORAL_TABLET | ORAL | 0 refills | Status: DC

## 2022-05-09 MED ORDER — METOPROLOL TARTRATE 25 MG PO TABS: 12.5000 mg | ORAL_TABLET | Freq: Two times a day (BID) | ORAL | 0 refills | Status: DC

## 2022-05-09 MED ORDER — ATORVASTATIN CALCIUM 10 MG PO TABS: ORAL_TABLET | ORAL | 0 refills | Status: DC

## 2022-05-09 NOTE — Progress Notes (Signed)
Subjective:  Patient ID: Michael Li, male    DOB: Jun 14, 1955  Age: 67 y.o. MRN: [id number]  CC: Annual Exam, Diabetes, Atrial Fibrillation, COPD, and Hyperlipidemia   HPI Michael Li presents for a CPX and f/up -----  He is active and denies chest pain, shortness of breath, diaphoresis, edema, dizziness, lightheadedness, or palpitations.  He was recently evaluated for hemoptysis and a lung CT scan revealed portal hypertension and gastric wall thickening.   Outpatient Medications Prior to Visit  Medication Sig Dispense Refill   albuterol (VENTOLIN HFA) 108 (90 Base) MCG/ACT inhaler Inhale 2 puffs into the lungs every 6 (six) hours as needed for wheezing or shortness of breath. 8 g 6   BD PEN NEEDLE MICRO U/F 32G X 6 MM MISC USE 2 PEN NEEDLES DAILY 200 each 3   Continuous Blood Gluc Receiver (FREESTYLE LIBRE 14 DAY READER) DEVI 1 Act by Does not apply route daily. 2 each 5   Continuous Blood Gluc Sensor (FREESTYLE LIBRE 14 DAY SENSOR) MISC USE AS DIRECTED EVERY 14 DAYS 2 each 5   insulin glargine, 1 Unit Dial, (TOUJEO SOLOSTAR) 300 UNIT/ML Solostar Pen ADMINISTER 20 UNITS UNDER THE SKIN EVERY MORNING 4.5 mL 1   alfuzosin (UROXATRAL) 10 MG 24 hr tablet Take 1 by mouth daily with breakfast 90 tablet 0   atorvastatin (LIPITOR) 10 MG tablet TAKE 1 TABLET(10 MG) BY MOUTH DAILY 90 tablet 0   FARXIGA 10 MG TABS tablet Take 1 tablet (10 mg total) by mouth daily. 90 tablet 0   lisdexamfetamine (VYVANSE) 30 MG capsule Take 1 capsule (30 mg total) by mouth daily. 30 capsule 0   metFORMIN (GLUCOPHAGE) 1000 MG tablet Take 1 tablet (1,000 mg total) by mouth 2 (two) times daily with a meal. 180 tablet 0   metoprolol tartrate (LOPRESSOR) 25 MG tablet Take 0.5 tablets (12.5 mg total) by mouth 2 (two) times daily. 90 tablet 0   omeprazole (PRILOSEC) 20 MG capsule Take 1 capsule (20 mg total) by mouth daily. 90 capsule 1   rivaroxaban (XARELTO) 20 MG TABS tablet TAKE 1 TABLET(20 MG) BY MOUTH DAILY  WITH SUPPER 90 tablet 0   tadalafil (CIALIS) 20 MG tablet TAKE 1 TABLET BY MOUTH DAILY AS NEEDED FOR E.D. 10 tablet 5   No facility-administered medications prior to visit.    ROS Review of Systems  Constitutional: Negative.  Negative for diaphoresis and fatigue.  HENT: Negative.    Eyes: Negative.   Respiratory:  Negative for cough, chest tightness, shortness of breath and wheezing.   Cardiovascular:  Negative for chest pain, palpitations and leg swelling.  Gastrointestinal:  Negative for abdominal pain, constipation, diarrhea, nausea and vomiting.  Endocrine: Negative.   Genitourinary: Negative.  Negative for difficulty urinating.  Musculoskeletal: Negative.  Negative for arthralgias and myalgias.  Skin: Negative.  Negative for color change and rash.  Neurological:  Negative for dizziness and weakness.  Hematological:  Negative for adenopathy. Does not bruise/bleed easily.  Psychiatric/Behavioral:  Positive for decreased concentration. Negative for agitation, behavioral problems, confusion, self-injury, sleep disturbance and suicidal ideas. The patient is not nervous/anxious.     Objective:  BP 120/62 (BP Location: Left Arm, Patient Position: Sitting, Cuff Size: Large)   Pulse (!) 54   Temp 97.8 F (36.6 C) (Oral)   Ht 5\' 9"  (1.753 m)   Wt 169 lb (76.7 kg)   SpO2 91%   BMI 24.96 kg/m   BP Readings from Last 3 Encounters:  05/09/22 120/62  03/21/22 118/64  01/11/22 116/68    Wt Readings from Last 3 Encounters:  05/09/22 169 lb (76.7 kg)  03/21/22 171 lb (77.6 kg)  01/11/22 165 lb (74.8 kg)    Physical Exam Vitals reviewed.  Constitutional:      Appearance: Normal appearance.  HENT:     Nose: Nose normal.     Mouth/Throat:     Mouth: Mucous membranes are moist.  Eyes:     General: No scleral icterus.    Conjunctiva/sclera: Conjunctivae normal.  Cardiovascular:     Rate and Rhythm: Normal rate and regular rhythm.     Heart sounds: No murmur heard.    No  gallop.  Pulmonary:     Effort: Pulmonary effort is normal.     Breath sounds: No stridor. No wheezing, rhonchi or rales.  Abdominal:     General: Abdomen is flat.     Palpations: There is no mass.     Tenderness: There is no abdominal tenderness. There is no guarding or rebound.     Hernia: No hernia is present.  Musculoskeletal:        General: Normal range of motion.     Cervical back: Neck supple.     Right lower leg: No edema.     Left lower leg: No edema.  Lymphadenopathy:     Cervical: No cervical adenopathy.  Skin:    General: Skin is warm and dry.     Findings: No rash.  Neurological:     General: No focal deficit present.     Mental Status: He is alert.  Psychiatric:        Mood and Affect: Mood normal.        Behavior: Behavior normal.     Lab Results  Component Value Date   WBC 5.4 12/02/2021   HGB 15.9 12/02/2021   HCT 47.8 12/02/2021   PLT 193.0 12/02/2021   GLUCOSE 172 (H) 05/10/2022   CHOL 110 05/09/2022   TRIG 212.0 (H) 05/09/2022   HDL 38.60 (L) 05/09/2022   LDLDIRECT 58.0 05/09/2022   LDLCALC 52 03/18/2021   ALT 24 12/02/2021   AST 27 12/02/2021   NA 139 05/10/2022   K 4.4 05/10/2022   CL 102 05/10/2022   CREATININE 0.88 05/10/2022   BUN 19 05/10/2022   CO2 27 05/10/2022   TSH 1.98 12/02/2021   PSA 2.03 05/09/2022   INR 1.0 12/08/2020   HGBA1C 6.8 (H) 05/09/2022   MICROALBUR <0.7 05/09/2022    CT Chest Wo Contrast  Result Date: 02/12/2022 CLINICAL DATA:  Hemoptysis during episode of upper respiratory infection 5 weeks ago, no recurrent symptoms since then. EXAM: CT CHEST WITHOUT CONTRAST TECHNIQUE: Multidetector CT imaging of the chest was performed following the standard protocol without IV contrast. RADIATION DOSE REDUCTION: This exam was performed according to the departmental dose-optimization program which includes automated exposure control, adjustment of the mA and/or kV according to patient size and/or use of iterative  reconstruction technique. COMPARISON:  PA Lat chest 12/02/2021, PA Lat chest 11/18/2021, and CTA of the lower chest for coronary CTA dated 03/22/2019. FINDINGS: Cardiovascular: The cardiac size is normal. There is a minimal pericardial effusion. The coronary arteries are heavily calcified. There is scattered calcification in the aorta and great vessels without aneurysm. The pulmonary arteries and veins are normal caliber. Mediastinum/Nodes: There is a slightly prominent low right paratracheal lymph node of 10 mm in short axis, similar sized subcarinal lymph node. No other intrathoracic adenopathy is seen without  contrast. Axillary spaces are clear. Thyroid gland is unremarkable. The thoracic trachea is clear. The main bronchi are patent. The thoracic esophagus with mild distal thickening without masslike thickening. Probable reflux esophagitis related. This was seen previously. There is a small hiatal hernia. Lungs/Pleura: The lungs are moderately emphysematous with centrilobular changes predominating, greater in the upper lobes. There is bronchial thickening in the lower lobes, interval increase. Scattered linear scar-like opacities are again noted in the bases. There are coarse reticular markings in the lingular base, appearance suggests either post pneumonic scarring or atelectasis rather than an acute pneumonic process. In the left upper lobe above the plane of the prior study there is an irregular 6 mm apical nodule on 3:21 with stranding to the pleural surface. Remainder of the lungs are clear. There is no pleural effusion, thickening or pneumothorax. Upper Abdomen: The liver is cirrhotic and mildly steatotic. The spleen is slightly enlarged, 13.4 cm transverse. The hepatic portal vein is prominent measuring 17 mm indicating portal hypertension. There is moderate thickening of the stomach which was seen previously and could be from gastritis or portal gastropathy. No new abnormality is seen in the upper  abdomen. Musculoskeletal: There is thoracic spondylosis but no suspicious bone lesions or spinal compression fracture. The ribcage is intact. The chest wall is unremarkable. IMPRESSION: 1. Emphysema with interval increase in bronchial thickening in the lower lobes. 2. Coarse reticular markings in the lingular base, appearance suggests either post pneumonic scarring or atelectasis rather than an acute pneumonic process. 3. 6 mm irregular nodule in the left upper lobe with stranding to the pleural surface. Per Fleischner guidelines, a follow-up CT is recommended in 6-12 months, and if stable at follow-up, a further follow-up study at 18-24 months from today's study. These guidelines do not apply to cancer patients or immunocompromised patients. For lung cancer screening, adhere to Lung-RADS guidelines. 4. Slightly prominent mediastinal nodes. 5. Aortic and coronary artery atherosclerosis. 6. Small hiatal hernia with probable reflux esophagitis. 7. Cirrhotic liver with mild splenomegaly and a prominent hepatic portal vein. 8. Chronic gastric thickening which could be due to gastritis or portal gastropathy. Aortic Atherosclerosis (ICD10-I70.0) and Emphysema (ICD10-J43.9). Electronically Signed   By: Telford Nab M.D.   On: 02/12/2022 03:00    Assessment & Plan:   Benign prostatic hyperplasia with urinary hesitancy -     PSA; Future  Attention deficit hyperactivity disorder (ADHD), predominantly inattentive type -     Lisdexamfetamine Dimesylate; Take 1 capsule (30 mg total) by mouth daily.  Dispense: 30 capsule; Refill: 0  Benign prostatic hyperplasia with weak urinary stream -     Alfuzosin HCl ER; Take 1 by mouth daily with breakfast  Dispense: 90 tablet; Refill: 0  Type II diabetes mellitus with manifestations (HCC)-his blood sugar is well-controlled. Wilder Glade; Take 1 tablet (10 mg total) by mouth daily.  Dispense: 90 tablet; Refill: 0 -     metFORMIN HCl; Take 1 tablet (1,000 mg total) by  mouth 2 (two) times daily with a meal.  Dispense: 180 tablet; Refill: 0 -     Hemoglobin A1c; Future  Hyperlipidemia LDL goal <100- LDL goal achieved. Doing well on the statin  -     Atorvastatin Calcium; TAKE 1 TABLET(10 MG) BY MOUTH DAILY  Dispense: 90 tablet; Refill: 0 -     Lipid panel; Future  Paroxysmal atrial fibrillation (HCC) -     Metoprolol Tartrate; Take 0.5 tablets (12.5 mg total) by mouth 2 (two) times  daily.  Dispense: 90 tablet; Refill: 0  Gastroesophageal reflux disease without esophagitis -     Omeprazole; Take 1 capsule (20 mg total) by mouth daily.  Dispense: 90 capsule; Refill: 1  PAF (paroxysmal atrial fibrillation) ([place])- He has good rate and rhythm control. -     Rivaroxaban; TAKE 1 TABLET(20 MG) BY MOUTH DAILY WITH SUPPER  Dispense: 90 tablet; Refill: 0 -     Basic metabolic panel; Future  Erectile dysfunction due to arterial insufficiency -     Tadalafil; TAKE 1 TABLET BY MOUTH DAILY AS NEEDED FOR E.D.  Dispense: 10 tablet; Refill: 5  Encounter for general adult medical examination with abnormal findings- Exam completed, labs reviewed, vaccines reviewed and updated, cancer screenings are up-to-date, patient education was given.  Essential hypertension -     Urinalysis, Routine w reflex microscopic; Future -     Basic metabolic panel; Future  Insulin-requiring or dependent type II diabetes mellitus (HCC) -     Urinalysis, Routine w reflex microscopic; Future -     Microalbumin / creatinine urine ratio; Future -     HM Diabetes Foot Exam -     Basic metabolic panel; Future  Gastric wall thickening -     Ambulatory referral to Gastroenterology  Other orders -     LDL cholesterol, direct     Follow-up: Return in about 6 months (around 11/09/2022).  Scarlette Calico, MD

## 2022-05-09 NOTE — Patient Instructions (Signed)
Health Maintenance, Male Adopting a healthy lifestyle and getting preventive care are important in promoting health and wellness. Ask your health care provider about: The right schedule for you to have regular tests and exams. Things you can do on your own to prevent diseases and keep yourself healthy. What should I know about diet, weight, and exercise? Eat a healthy diet  Eat a diet that includes plenty of vegetables, fruits, low-fat dairy products, and lean protein. Do not eat a lot of foods that are high in solid fats, added sugars, or sodium. Maintain a healthy weight Body mass index (BMI) is a measurement that can be used to identify possible weight problems. It estimates body fat based on height and weight. Your health care provider can help determine your BMI and help you achieve or maintain a healthy weight. Get regular exercise Get regular exercise. This is one of the most important things you can do for your health. Most adults should: Exercise for at least 150 minutes each week. The exercise should increase your heart rate and make you sweat (moderate-intensity exercise). Do strengthening exercises at least twice a week. This is in addition to the moderate-intensity exercise. Spend less time sitting. Even light physical activity can be beneficial. Watch cholesterol and blood lipids Have your blood tested for lipids and cholesterol at 67 years of age, then have this test every 5 years. You may need to have your cholesterol levels checked more often if: Your lipid or cholesterol levels are high. You are older than 67 years of age. You are at high risk for heart disease. What should I know about cancer screening? Many types of cancers can be detected early and may often be prevented. Depending on your health history and family history, you may need to have cancer screening at various ages. This may include screening for: Colorectal cancer. Prostate cancer. Skin cancer. Lung  cancer. What should I know about heart disease, diabetes, and high blood pressure? Blood pressure and heart disease High blood pressure causes heart disease and increases the risk of stroke. This is more likely to develop in people who have high blood pressure readings or are overweight. Talk with your health care provider about your target blood pressure readings. Have your blood pressure checked: Every 3-5 years if you are 18-39 years of age. Every year if you are 40 years old or older. If you are between the ages of 65 and 75 and are a current or former smoker, ask your health care provider if you should have a one-time screening for abdominal aortic aneurysm (AAA). Diabetes Have regular diabetes screenings. This checks your fasting blood sugar level. Have the screening done: Once every three years after age 45 if you are at a normal weight and have a low risk for diabetes. More often and at a younger age if you are overweight or have a high risk for diabetes. What should I know about preventing infection? Hepatitis B If you have a higher risk for hepatitis B, you should be screened for this virus. Talk with your health care provider to find out if you are at risk for hepatitis B infection. Hepatitis C Blood testing is recommended for: Everyone born from 1945 through 1965. Anyone with known risk factors for hepatitis C. Sexually transmitted infections (STIs) You should be screened each year for STIs, including gonorrhea and chlamydia, if: You are sexually active and are younger than 67 years of age. You are older than 67 years of age and your   health care provider tells you that you are at risk for this type of infection. Your sexual activity has changed since you were last screened, and you are at increased risk for chlamydia or gonorrhea. Ask your health care provider if you are at risk. Ask your health care provider about whether you are at high risk for HIV. Your health care provider  may recommend a prescription medicine to help prevent HIV infection. If you choose to take medicine to prevent HIV, you should first get tested for HIV. You should then be tested every 3 months for as long as you are taking the medicine. Follow these instructions at home: Alcohol use Do not drink alcohol if your health care provider tells you not to drink. If you drink alcohol: Limit how much you have to 0-2 drinks a day. Know how much alcohol is in your drink. In the [place] one drink equals one 12 oz bottle of beer (355 mL), one 5 oz glass of wine (148 mL), or one 1 oz glass of hard liquor (44 mL). Lifestyle Do not use any products that contain nicotine or tobacco. These products include cigarettes, chewing tobacco, and vaping devices, such as e-cigarettes. If you need help quitting, ask your health care provider. Do not use street drugs. Do not share needles. Ask your health care provider for help if you need support or information about quitting drugs. General instructions Schedule regular health, dental, and eye exams. Stay current with your vaccines. Tell your health care provider if: You often feel depressed. You have ever been abused or do not feel safe at home. Summary Adopting a healthy lifestyle and getting preventive care are important in promoting health and wellness. Follow your health care provider's instructions about healthy diet, exercising, and getting tested or screened for diseases. Follow your health care provider's instructions on monitoring your cholesterol and blood pressure. This information is not intended to replace advice given to you by your health care provider. Make sure you discuss any questions you have with your health care provider. Document Revised: 06/22/2020 Document Reviewed: 06/22/2020 Elsevier Patient Education  2023 [employer]  

## 2022-05-10 LAB — BASIC METABOLIC PANEL
BUN: 19 mg/dL (ref 6–23)
CO2: 27 mEq/L (ref 19–32)
Calcium: 9.7 mg/dL (ref 8.4–10.5)
Chloride: 102 mEq/L (ref 96–112)
Creatinine, Ser: 0.88 mg/dL (ref 0.40–1.50)
GFR: 89.25 mL/min (ref >60.00)
Glucose, Bld: 172 mg/dL — ABNORMAL HIGH (ref 70–99)
Potassium: 4.4 mEq/L (ref 3.5–5.1)
Sodium: 139 mEq/L (ref 135–145)

## 2022-05-10 LAB — URINALYSIS, ROUTINE W REFLEX MICROSCOPIC
Bilirubin Urine: NEGATIVE
Hgb urine dipstick: NEGATIVE
Ketones, ur: NEGATIVE
Leukocytes,Ua: NEGATIVE
Nitrite: NEGATIVE
RBC / HPF: NONE SEEN (ref 0–?)
Specific Gravity, Urine: 1.02 (ref 1.000–1.030)
Total Protein, Urine: NEGATIVE
Urine Glucose: >=1000 — AB
Urobilinogen, UA: 0.2 (ref 0.0–1.0)
WBC, UA: NONE SEEN (ref 0–?)
pH: 6 (ref 5.0–8.0)

## 2022-05-10 LAB — PSA: PSA: 2.03 ng/mL (ref 0.10–4.00)

## 2022-05-22 ENCOUNTER — Encounter (HOSPITAL_COMMUNITY): Payer: Self-pay

## 2022-05-22 ENCOUNTER — Ambulatory Visit (HOSPITAL_COMMUNITY)
Admission: EM | Admit: 2022-05-22 | Discharge: 2022-05-22 | Disposition: A | Discharge status: Home / Self Care | Payer: 59 | Attending: Family Medicine | Admitting: Family Medicine

## 2022-05-22 DIAGNOSIS — L03115 Cellulitis of right lower limb: Secondary | ICD-10-CM | POA: Diagnosis not present

## 2022-05-22 MED ORDER — CLINDAMYCIN HCL 300 MG PO CAPS: 300.0000 mg | ORAL_CAPSULE | Freq: Three times a day (TID) | ORAL | 0 refills | Status: AC

## 2022-05-22 MED ORDER — MUPIROCIN 2 % EX OINT: 1.0000 | TOPICAL_OINTMENT | Freq: Two times a day (BID) | CUTANEOUS | 0 refills | Status: DC

## 2022-05-22 NOTE — ED Notes (Signed)
Reviewed work note 

## 2022-05-22 NOTE — Discharge Instructions (Signed)
--  Take clindamycin 300 mg-- 1 capsule 3 times daily for 7 days  Put mupirocin ointment on the sore areas twice daily until improved   

## 2022-05-22 NOTE — ED Provider Notes (Signed)
[facility]    CSN: [id number] Arrival date & time: 05/22/22  1010      History   Chief Complaint Chief Complaint  Patient presents with   Abscess    Back of right heel    HPI Michael Li is a 67 y.o. male.    Abscess  Here for swelling and redness on his right heel.  On April 4 he wore some new shoes to work without socks.  He sustained blisters to his feet after walking around at work.  The one on his left heel is healing okay, though it is oozing a little serous drainage.  The area on his right heel has become more inflamed and he is now having swelling to the lateral aspect and around the lateral malleolus.  No fever or chills  He does have diabetes  Cephalexin causes anaphylaxis.  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   Basal cell    Patient Active Problem List   Diagnosis Date Noted   Gastric wall thickening 05/09/2022   Attention deficit hyperactivity disorder (ADHD), predominantly inattentive type 03/20/2021   Benign prostatic hyperplasia with weak urinary stream 03/19/2021   Encounter for general adult medical examination with abnormal findings 03/18/2021   Elevated coronary artery calcium score 11/06/2020   Insulin-requiring or dependent type II diabetes mellitus 08/26/2020   PAF (paroxysmal atrial fibrillation) 08/26/2020   Essential hypertension 07/31/2019   Benign prostatic hyperplasia with urinary hesitancy 08/07/2018   Hyperlipidemia LDL goal <100  01/22/2018   Atrial fibrillation 10/25/2016   HNP (herniated nucleus pulposus), lumbar 09/12/2012   Hep C w/ coma, chronic (HCC) 04/03/2007   Erectile dysfunction due to arterial disease 07/19/2006   GERD 07/19/2006    Past Surgical History:  Procedure Laterality Date   APPENDECTOMY     ATRIAL FIBRILLATION ABLATION N/A 03/27/2019   Procedure: ATRIAL FIBRILLATION ABLATION;  Surgeon: Regan Lemming, MD;  [facility];  Service: Cardiovascular;  Laterality: N/A;   ATRIAL FIBRILLATION ABLATION N/A 06/19/2019   Procedure: ATRIAL FIBRILLATION ABLATION;  Surgeon: Regan Lemming, MD;  [facility]: [facility];  Service: Cardiovascular;  Laterality: N/A;   carotid injury       post tonsillectomy, R carotid injury with trach   HERNIA REPAIR  1973   inguinal- R side   KNEE SURGERY Right    LUMBAR LAMINECTOMY/DECOMPRESSION MICRODISCECTOMY Right 09/12/2012   Procedure: LUMBAR LAMINECTOMY/DECOMPRESSION MICRODISCECTOMY 1 LEVEL;  Surgeon: Temple Pacini, MD;  Location: [facility];  Service: Neurosurgery;  Laterality: Right;  Right lumbar three-four microdiscectomy   ORIF SHOULDER FRACTURE Right 06/02/2017   Procedure: OPEN REDUCTION INTERNAL FIXATION (ORIF) RIGHT GLENOID FRACTURE, POSSIBLE LATARJET CORACOID TRANSFER;  Surgeon: Cammy Copa, MD;  Location: [facility];  Service: Orthopedics;  Laterality: Right;   TEE WITHOUT CARDIOVERSION N/A 06/19/2019   Procedure: TRANSESOPHAGEAL ECHOCARDIOGRAM (TEE);  Surgeon: Vesta Mixer, MD;  Location: [facility];  Service: Cardiovascular;  Laterality: N/A;   TONSILLECTOMY     TRACHEOSTOMY CLOSURE  1981   UMBILICAL HERNIA REPAIR N/A 12/08/2020   Procedure: HERNIA REPAIR UMBILICAL;  Surgeon: Kinsinger, De Blanch, MD;  Location: [facility];  Service: General;  Laterality: N/A;       Home Medications    Prior to Admission medications   Medication Sig Start Date End Date Taking? Authorizing Provider  albuterol (VENTOLIN HFA)  108 (90 Base) MCG/ACT inhaler Inhale 2 puffs into the lungs every 6 (six) hours as needed for wheezing or shortness of breath. 01/11/22  Yes Martina Sinner, MD  alfuzosin (UROXATRAL) 10 MG 24 hr tablet Take 1 by mouth daily with breakfast 05/09/22  Yes Etta Grandchild, MD  atorvastatin (LIPITOR) 10 MG tablet TAKE 1 TABLET(10 MG) BY MOUTH DAILY 05/09/22  Yes Etta Grandchild, MD  BD PEN NEEDLE MICRO U/F 32G X 6 MM MISC USE 2 PEN NEEDLES DAILY 04/18/22  Yes Etta Grandchild, MD  clindamycin (CLEOCIN) 300 MG capsule Take 1 capsule (300 mg total) by mouth 3 (three) times daily for 7 days. 05/22/22 05/29/22 Yes Nephtali Docken, Janace Aris, MD  Continuous Blood Gluc Receiver (FREESTYLE LIBRE 14 DAY READER) DEVI 1 Act by Does not apply route daily. 09/30/21  Yes Etta Grandchild, MD  Continuous Blood Gluc Sensor (FREESTYLE LIBRE 14 DAY SENSOR) MISC USE AS DIRECTED EVERY 14 DAYS 01/27/22  Yes Etta Grandchild, MD  FARXIGA 10 MG TABS tablet Take 1 tablet (10 mg total) by mouth daily. 05/09/22  Yes Etta Grandchild, MD  insulin glargine, 1 Unit Dial, (TOUJEO SOLOSTAR) 300 UNIT/ML Solostar Pen ADMINISTER 20 UNITS UNDER THE SKIN EVERY MORNING 01/27/22  Yes Etta Grandchild, MD  lisdexamfetamine (VYVANSE) 30 MG capsule Take 1 capsule (30 mg total) by mouth daily. 05/09/22  Yes Etta Grandchild, MD  metFORMIN (GLUCOPHAGE) 1000 MG tablet Take 1 tablet (1,000 mg total) by mouth 2 (two) times daily with a meal. 05/09/22  Yes Etta Grandchild, MD  metoprolol tartrate (LOPRESSOR) 25 MG tablet Take 0.5 tablets (12.5 mg total) by mouth 2 (two) times daily. 05/09/22  Yes Etta Grandchild, MD  mupirocin ointment (BACTROBAN) 2 % Apply 1 Application topically 2 (two) times daily. To affected area till better 05/22/22  Yes Brooks Stotz, Janace Aris, MD  omeprazole (PRILOSEC) 20 MG capsule Take 1 capsule (20 mg total) by mouth daily. 05/09/22  Yes Etta Grandchild, MD  rivaroxaban (XARELTO) 20 MG TABS tablet TAKE 1 TABLET(20 MG) BY MOUTH DAILY WITH SUPPER 05/09/22   Yes Etta Grandchild, MD  tadalafil (CIALIS) 20 MG tablet TAKE 1 TABLET BY MOUTH DAILY AS NEEDED FOR E.D. 05/09/22  Yes Etta Grandchild, MD  insulin aspart (NOVOLOG FLEXPEN) 100 unit/mL SOLN FlexPen Inject subcutaneously (just before each meal) 22-22-20 units 07/11/12 08/28/13  Gordy Savers, MD    Family History Family History  Problem Relation Age of Onset   COPD Mother    Heart attack Father    Diabetes Maternal Aunt    Cancer Paternal Grandmother        colon cancer   Diabetes Maternal Aunt     Social History Social History   Tobacco Use   Smoking status: Former    Years: 34    Types: Cigarettes    Quit date: 2007    Years since quitting: 17.2    Passive exposure: Past   Smokeless tobacco: Former    Types: Sports administrator  Quit date: 02/15/2007  Vaping Use   Vaping Use: Never used  Substance Use Topics   Alcohol use: Yes    Comment: 3 glasses of wine per week    Drug use: Yes    Types: Marijuana    Comment: gummies occasionally     Allergies   Cephalexin and Edarbi [azilsartan]   Review of Systems Review of Systems   Physical Exam Triage Vital Signs ED Triage Vitals  Enc Vitals Group     BP 05/22/22 1048 (!) 145/74     Pulse Rate 05/22/22 1048 70     Resp 05/22/22 1048 16     Temp 05/22/22 1048 97.6 F (36.4 C)     Temp Source 05/22/22 1048 Oral     SpO2 05/22/22 1048 96 %     Weight 05/22/22 1048 168 lb (76.2 kg)     Height 05/22/22 1048 5\' 8"  (1.727 m)     Head Circumference --      Peak Flow --      Pain Score 05/22/22 1047 4     Pain Loc --      Pain Edu? --      Excl. in GC? --    No data found.  Updated Vital Signs BP (!) 145/74 (BP Location: Right Arm)   Pulse 70   Temp 97.6 F (36.4 C) (Oral)   Resp 16   Ht 5\' 8"  (1.727 m)   Wt 76.2 kg   SpO2 96%   BMI 25.54 kg/m   Visual Acuity Right Eye Distance:   Left Eye Distance:   Bilateral Distance:    Right Eye Near:   Left Eye Near:    Bilateral Near:     Physical Exam Vitals  reviewed.  Constitutional:      General: He is not in acute distress.    Appearance: He is not ill-appearing, toxic-appearing or diaphoretic.  Skin:    Capillary Refill: Capillary refill takes less than 2 seconds.     Coloration: Skin is not jaundiced or pale.     Comments: On the left heel over the Achilles tendon there is an ulceration consistent with an open blister.  It is 1 x 1.5 cm.  There is no surrounding erythema or induration, and it is draining a little serous liquid.  On the right heel there is an ulceration that has some blood overlying it.  There is an area of erythema that is about 8 cm in diameter.  There is no central fluctuance.  The right blister is oozing a little bloody serosanguineous material.  Neurological:     General: No focal deficit present.     Mental Status: He is alert and oriented to person, place, and time.  Psychiatric:        Behavior: Behavior normal.      UC Treatments / Results  Labs (all labs ordered are listed, but only abnormal results are displayed) Labs Reviewed - No data to display  EKG   Radiology No results found.  Procedures Procedures (including critical care time)  Medications Ordered in UC Medications - No data to display  Initial Impression / Assessment and Plan / UC Course  I have reviewed the triage vital signs and the nursing notes.  Pertinent labs & imaging results that were available during my care of the patient were reviewed by me and considered in my medical decision making (see chart for details).     I am going to treat with clindamycin  for 7 days, and also mupirocin is sent in for him to apply to the open wounds. Final Clinical Impressions(s) / UC Diagnoses   Final diagnoses:  Cellulitis of right lower extremity     Discharge Instructions      --Take clindamycin 300 mg-- 1 capsule 3 times daily for 7 days  Put mupirocin ointment on the sore areas twice daily until improved       ED  Prescriptions     Medication Sig Dispense Auth. Provider   clindamycin (CLEOCIN) 300 MG capsule Take 1 capsule (300 mg total) by mouth 3 (three) times daily for 7 days. 21 capsule Zenia ResidesBanister, Laterrica Libman K, MD   mupirocin ointment (BACTROBAN) 2 % Apply 1 Application topically 2 (two) times daily. To affected area till better 22 g Marlinda MikeBanister, Janace ArisPamela K, MD      PDMP not reviewed this encounter.   Zenia ResidesBanister, Leul Narramore K, MD 05/22/22 [phone]

## 2022-05-22 NOTE — ED Triage Notes (Signed)
Patient here today with c/o blisters on the back of his heels after getting new shoes on Thursday. He thinks that the blister on his right heels has gotten infected yesterday. C/o redness, pain, swelling, drainage.

## 2022-05-31 ENCOUNTER — Other Ambulatory Visit: Payer: Self-pay | Admitting: Internal Medicine

## 2022-05-31 DIAGNOSIS — F900 Attention-deficit hyperactivity disorder, predominantly inattentive type: Secondary | ICD-10-CM

## 2022-06-01 MED ORDER — LISDEXAMFETAMINE DIMESYLATE 30 MG PO CAPS
30.0000 mg | ORAL_CAPSULE | Freq: Every day | ORAL | 0 refills | Status: DC
Start: 2022-06-01 — End: 2022-07-11

## 2022-07-11 ENCOUNTER — Other Ambulatory Visit: Payer: Self-pay | Admitting: Internal Medicine

## 2022-07-11 DIAGNOSIS — F900 Attention-deficit hyperactivity disorder, predominantly inattentive type: Secondary | ICD-10-CM

## 2022-07-12 MED ORDER — LISDEXAMFETAMINE DIMESYLATE 30 MG PO CAPS
30.0000 mg | ORAL_CAPSULE | Freq: Every day | ORAL | 0 refills | Status: DC
Start: 2022-07-12 — End: 2022-08-01

## 2022-07-18 ENCOUNTER — Other Ambulatory Visit: Payer: Self-pay | Admitting: Internal Medicine

## 2022-07-18 DIAGNOSIS — E118 Type 2 diabetes mellitus with unspecified complications: Secondary | ICD-10-CM

## 2022-07-19 MED ORDER — FARXIGA 10 MG PO TABS: 10.0000 mg | ORAL_TABLET | Freq: Every day | ORAL | 0 refills | Status: DC

## 2022-08-01 ENCOUNTER — Other Ambulatory Visit: Payer: Self-pay | Admitting: Internal Medicine

## 2022-08-01 DIAGNOSIS — N401 Enlarged prostate with lower urinary tract symptoms: Secondary | ICD-10-CM

## 2022-08-01 DIAGNOSIS — E118 Type 2 diabetes mellitus with unspecified complications: Secondary | ICD-10-CM

## 2022-08-01 DIAGNOSIS — E785 Hyperlipidemia, unspecified: Secondary | ICD-10-CM

## 2022-08-01 DIAGNOSIS — F900 Attention-deficit hyperactivity disorder, predominantly inattentive type: Secondary | ICD-10-CM

## 2022-08-01 DIAGNOSIS — I480 Paroxysmal atrial fibrillation: Secondary | ICD-10-CM

## 2022-08-02 MED ORDER — ALFUZOSIN HCL ER 10 MG PO TB24
ORAL_TABLET | ORAL | 0 refills | Status: DC
Start: 2022-08-02 — End: 2022-08-29

## 2022-08-02 MED ORDER — ATORVASTATIN CALCIUM 10 MG PO TABS: ORAL_TABLET | ORAL | 0 refills | Status: DC

## 2022-08-02 MED ORDER — FARXIGA 10 MG PO TABS: 10.0000 mg | ORAL_TABLET | Freq: Every day | ORAL | 0 refills | Status: DC

## 2022-08-02 MED ORDER — METOPROLOL TARTRATE 25 MG PO TABS: 12.5000 mg | ORAL_TABLET | Freq: Two times a day (BID) | ORAL | 0 refills | Status: DC

## 2022-08-02 MED ORDER — LISDEXAMFETAMINE DIMESYLATE 30 MG PO CAPS
30.0000 mg | ORAL_CAPSULE | Freq: Every day | ORAL | 0 refills | Status: DC
Start: 2022-08-02 — End: 2022-08-29

## 2022-08-02 MED ORDER — RIVAROXABAN 20 MG PO TABS: ORAL_TABLET | ORAL | 0 refills | Status: DC

## 2022-08-29 ENCOUNTER — Other Ambulatory Visit: Payer: Self-pay | Admitting: Internal Medicine

## 2022-08-29 DIAGNOSIS — E785 Hyperlipidemia, unspecified: Secondary | ICD-10-CM

## 2022-08-29 DIAGNOSIS — F900 Attention-deficit hyperactivity disorder, predominantly inattentive type: Secondary | ICD-10-CM

## 2022-08-29 DIAGNOSIS — I480 Paroxysmal atrial fibrillation: Secondary | ICD-10-CM

## 2022-08-29 DIAGNOSIS — N401 Enlarged prostate with lower urinary tract symptoms: Secondary | ICD-10-CM

## 2022-08-29 DIAGNOSIS — E118 Type 2 diabetes mellitus with unspecified complications: Secondary | ICD-10-CM

## 2022-08-31 MED ORDER — FARXIGA 10 MG PO TABS
10.0000 mg | ORAL_TABLET | Freq: Every day | ORAL | 0 refills | Status: DC
Start: 2022-08-31 — End: 2022-11-23

## 2022-08-31 MED ORDER — LISDEXAMFETAMINE DIMESYLATE 30 MG PO CAPS
30.0000 mg | ORAL_CAPSULE | Freq: Every day | ORAL | 0 refills | Status: DC
Start: 2022-08-31 — End: 2022-10-12

## 2022-08-31 MED ORDER — RIVAROXABAN 20 MG PO TABS
ORAL_TABLET | ORAL | 0 refills | Status: DC
Start: 2022-08-31 — End: 2022-11-23

## 2022-08-31 MED ORDER — ALFUZOSIN HCL ER 10 MG PO TB24
ORAL_TABLET | ORAL | 0 refills | Status: DC
Start: 2022-08-31 — End: 2022-11-23

## 2022-08-31 MED ORDER — ATORVASTATIN CALCIUM 10 MG PO TABS
ORAL_TABLET | ORAL | 0 refills | Status: DC
Start: 2022-08-31 — End: 2022-11-23

## 2022-08-31 MED ORDER — METOPROLOL TARTRATE 25 MG PO TABS
12.5000 mg | ORAL_TABLET | Freq: Two times a day (BID) | ORAL | 0 refills | Status: DC
Start: 2022-08-31 — End: 2022-11-22

## 2022-10-11 ENCOUNTER — Other Ambulatory Visit: Payer: Self-pay | Admitting: Internal Medicine

## 2022-10-11 DIAGNOSIS — F900 Attention-deficit hyperactivity disorder, predominantly inattentive type: Secondary | ICD-10-CM

## 2022-10-11 DIAGNOSIS — E118 Type 2 diabetes mellitus with unspecified complications: Secondary | ICD-10-CM

## 2022-10-12 MED ORDER — METFORMIN HCL 1000 MG PO TABS
1000.0000 mg | ORAL_TABLET | Freq: Two times a day (BID) | ORAL | 0 refills | Status: DC
Start: 2022-10-12 — End: 2022-11-23

## 2022-10-12 MED ORDER — LISDEXAMFETAMINE DIMESYLATE 30 MG PO CAPS
30.0000 mg | ORAL_CAPSULE | Freq: Every day | ORAL | 0 refills | Status: DC
Start: 2022-10-12 — End: 2022-11-23

## 2022-10-24 ENCOUNTER — Other Ambulatory Visit: Payer: Self-pay | Admitting: Internal Medicine

## 2022-10-24 DIAGNOSIS — K219 Gastro-esophageal reflux disease without esophagitis: Secondary | ICD-10-CM

## 2022-10-25 ENCOUNTER — Other Ambulatory Visit: Payer: Self-pay | Admitting: Internal Medicine

## 2022-10-25 DIAGNOSIS — E119 Type 2 diabetes mellitus without complications: Secondary | ICD-10-CM

## 2022-10-28 ENCOUNTER — Other Ambulatory Visit: Payer: Self-pay | Admitting: Internal Medicine

## 2022-10-28 DIAGNOSIS — I480 Paroxysmal atrial fibrillation: Secondary | ICD-10-CM

## 2022-10-28 DIAGNOSIS — N401 Enlarged prostate with lower urinary tract symptoms: Secondary | ICD-10-CM

## 2022-10-28 DIAGNOSIS — E118 Type 2 diabetes mellitus with unspecified complications: Secondary | ICD-10-CM

## 2022-10-28 DIAGNOSIS — F900 Attention-deficit hyperactivity disorder, predominantly inattentive type: Secondary | ICD-10-CM

## 2022-10-28 DIAGNOSIS — E785 Hyperlipidemia, unspecified: Secondary | ICD-10-CM

## 2022-11-14 ENCOUNTER — Encounter: Payer: Self-pay | Admitting: Internal Medicine

## 2022-11-17 NOTE — Telephone Encounter (Signed)
Patient has appointment scheduled for next Tuesday, October 8th.  Can you please send in enough medication to the pharmacy to last till this appointment.  Pharmacy:  [facility] on [address].  Please call patient and let him know that this has been sent in.  Phone:  [phone]

## 2022-11-22 ENCOUNTER — Ambulatory Visit: Payer: 59 | Admitting: Internal Medicine

## 2022-11-22 ENCOUNTER — Ambulatory Visit: Payer: 59 | Attending: Internal Medicine

## 2022-11-22 ENCOUNTER — Other Ambulatory Visit: Payer: Self-pay | Admitting: Internal Medicine

## 2022-11-22 ENCOUNTER — Encounter: Payer: Self-pay | Admitting: Internal Medicine

## 2022-11-22 VITALS — BP 144/70 | HR 60 | Temp 97.9°F | Resp 16 | Ht 68.0 in | Wt 168.0 lb

## 2022-11-22 DIAGNOSIS — E785 Hyperlipidemia, unspecified: Secondary | ICD-10-CM | POA: Diagnosis not present

## 2022-11-22 DIAGNOSIS — E118 Type 2 diabetes mellitus with unspecified complications: Secondary | ICD-10-CM

## 2022-11-22 DIAGNOSIS — R001 Bradycardia, unspecified: Secondary | ICD-10-CM

## 2022-11-22 DIAGNOSIS — I779 Disorder of arteries and arterioles, unspecified: Secondary | ICD-10-CM

## 2022-11-22 DIAGNOSIS — Z23 Encounter for immunization: Secondary | ICD-10-CM | POA: Diagnosis not present

## 2022-11-22 DIAGNOSIS — Z794 Long term (current) use of insulin: Secondary | ICD-10-CM

## 2022-11-22 DIAGNOSIS — E119 Type 2 diabetes mellitus without complications: Secondary | ICD-10-CM | POA: Diagnosis not present

## 2022-11-22 DIAGNOSIS — N401 Enlarged prostate with lower urinary tract symptoms: Secondary | ICD-10-CM

## 2022-11-22 DIAGNOSIS — K3189 Other diseases of stomach and duodenum: Secondary | ICD-10-CM

## 2022-11-22 DIAGNOSIS — I10 Essential (primary) hypertension: Secondary | ICD-10-CM

## 2022-11-22 DIAGNOSIS — N521 Erectile dysfunction due to diseases classified elsewhere: Secondary | ICD-10-CM

## 2022-11-22 DIAGNOSIS — R3912 Poor urinary stream: Secondary | ICD-10-CM

## 2022-11-22 DIAGNOSIS — F900 Attention-deficit hyperactivity disorder, predominantly inattentive type: Secondary | ICD-10-CM

## 2022-11-22 DIAGNOSIS — K219 Gastro-esophageal reflux disease without esophagitis: Secondary | ICD-10-CM

## 2022-11-22 DIAGNOSIS — I480 Paroxysmal atrial fibrillation: Secondary | ICD-10-CM | POA: Diagnosis not present

## 2022-11-22 DIAGNOSIS — N5201 Erectile dysfunction due to arterial insufficiency: Secondary | ICD-10-CM

## 2022-11-22 LAB — BASIC METABOLIC PANEL
BUN: 17 mg/dL (ref 6–23)
CO2: 28 meq/L (ref 19–32)
Calcium: 9.7 mg/dL (ref 8.4–10.5)
Chloride: 104 meq/L (ref 96–112)
Creatinine, Ser: 0.89 mg/dL (ref 0.40–1.50)
GFR: 88.61 mL/min (ref >60.00)
Glucose, Bld: 153 mg/dL — ABNORMAL HIGH (ref 70–99)
Potassium: 5.1 meq/L (ref 3.5–5.1)
Sodium: 140 meq/L (ref 135–145)

## 2022-11-22 LAB — CBC WITH DIFFERENTIAL/PLATELET
Basophils Absolute: 0 10*3/uL (ref 0.0–0.1)
Basophils Relative: 0.2 % (ref 0.0–3.0)
Eosinophils Absolute: 0.1 10*3/uL (ref 0.0–0.7)
Eosinophils Relative: 1.3 % (ref 0.0–5.0)
HCT: 42.3 % (ref 39.0–52.0)
Hemoglobin: 14.4 g/dL (ref 13.0–17.0)
Lymphocytes Relative: 23.1 % (ref 12.0–46.0)
Lymphs Abs: 1.1 10*3/uL (ref 0.7–4.0)
MCHC: 34 g/dL (ref 30.0–36.0)
MCV: 87.7 fL (ref 78.0–100.0)
Monocytes Absolute: 0.5 10*3/uL (ref 0.1–1.0)
Monocytes Relative: 10 % (ref 3.0–12.0)
Neutro Abs: 3.2 10*3/uL (ref 1.4–7.7)
Neutrophils Relative %: 65.4 % (ref 43.0–77.0)
Platelets: 166 10*3/uL (ref 150.0–400.0)
RBC: 4.83 Mil/uL (ref 4.22–5.81)
RDW: 13.5 % (ref 11.5–15.5)
WBC: 4.9 10*3/uL (ref 4.0–10.5)

## 2022-11-22 LAB — HEPATIC FUNCTION PANEL
ALT: 35 U/L (ref 0–53)
AST: 33 U/L (ref 0–37)
Albumin: 4.5 g/dL (ref 3.5–5.2)
Alkaline Phosphatase: 85 U/L (ref 39–117)
Bilirubin, Direct: 0.2 mg/dL (ref 0.0–0.3)
Total Bilirubin: 0.8 mg/dL (ref 0.2–1.2)
Total Protein: 6.5 g/dL (ref 6.0–8.3)

## 2022-11-22 LAB — LUTEINIZING HORMONE: LH: 10.07 m[IU]/mL — ABNORMAL HIGH (ref 1.50–9.30)

## 2022-11-22 LAB — TSH: TSH: 1.07 u[IU]/mL (ref 0.35–5.50)

## 2022-11-22 LAB — HEMOGLOBIN A1C: Hgb A1c MFr Bld: 6.8 % — ABNORMAL HIGH (ref 4.6–6.5)

## 2022-11-22 MED ORDER — METOPROLOL SUCCINATE ER 25 MG PO TB24
25.0000 mg | ORAL_TABLET | Freq: Every day | ORAL | 0 refills | Status: DC
Start: 2022-11-22 — End: 2022-12-26

## 2022-11-22 NOTE — Patient Instructions (Signed)
Hypertension, Adult High blood pressure (hypertension) is when the force of blood pumping through the arteries is too strong. The arteries are the blood vessels that carry blood from the heart throughout the body. Hypertension forces the heart to work harder to pump blood and may cause arteries to become narrow or stiff. Untreated or uncontrolled hypertension can lead to a heart attack, heart failure, a stroke, kidney disease, and other problems. A blood pressure reading consists of a higher number over a lower number. Ideally, your blood pressure should be below 120/80. The first ("top") number is called the systolic pressure. It is a measure of the pressure in your arteries as your heart beats. The second ("bottom") number is called the diastolic pressure. It is a measure of the pressure in your arteries as the heart relaxes. What are the causes? The exact cause of this condition is not known. There are some conditions that result in high blood pressure. What increases the risk? Certain factors may make you more likely to develop high blood pressure. Some of these risk factors are under your control, including: Smoking. Not getting enough exercise or physical activity. Being overweight. Having too much fat, sugar, calories, or salt (sodium) in your diet. Drinking too much alcohol. Other risk factors include: Having a personal history of heart disease, diabetes, high cholesterol, or kidney disease. Stress. Having a family history of high blood pressure and high cholesterol. Having obstructive sleep apnea. Age. The risk increases with age. What are the signs or symptoms? High blood pressure may not cause symptoms. Very high blood pressure (hypertensive crisis) may cause: Headache. Fast or irregular heartbeats (palpitations). Shortness of breath. Nosebleed. Nausea and vomiting. Vision changes. Severe chest pain, dizziness, and seizures. How is this diagnosed? This condition is diagnosed by  measuring your blood pressure while you are seated, with your arm resting on a flat surface, your legs uncrossed, and your feet flat on the floor. The cuff of the blood pressure monitor will be placed directly against the skin of your upper arm at the level of your heart. Blood pressure should be measured at least twice using the same arm. Certain conditions can cause a difference in blood pressure between your right and left arms. If you have a high blood pressure reading during one visit or you have normal blood pressure with other risk factors, you may be asked to: Return on a different day to have your blood pressure checked again. Monitor your blood pressure at home for 1 week or longer. If you are diagnosed with hypertension, you may have other blood or imaging tests to help your health care provider understand your overall risk for other conditions. How is this treated? This condition is treated by making healthy lifestyle changes, such as eating healthy foods, exercising more, and reducing your alcohol intake. You may be referred for counseling on a healthy diet and physical activity. Your health care provider may prescribe medicine if lifestyle changes are not enough to get your blood pressure under control and if: Your systolic blood pressure is above 130. Your diastolic blood pressure is above 80. Your personal target blood pressure may vary depending on your medical conditions, your age, and other factors. Follow these instructions at home: Eating and drinking  Eat a diet that is high in fiber and potassium, and low in sodium, added sugar, and fat. An example of this eating plan is called the DASH diet. DASH stands for Dietary Approaches to Stop Hypertension. To eat this way: Eat   plenty of fresh fruits and vegetables. Try to fill one half of your plate at each meal with fruits and vegetables. Eat whole grains, such as whole-wheat pasta, brown rice, or whole-grain bread. Fill about one  fourth of your plate with whole grains. Eat or drink low-fat dairy products, such as skim milk or low-fat yogurt. Avoid fatty cuts of meat, processed or cured meats, and poultry with skin. Fill about one fourth of your plate with lean proteins, such as fish, chicken without skin, beans, eggs, or tofu. Avoid pre-made and processed foods. These tend to be higher in sodium, added sugar, and fat. Reduce your daily sodium intake. Many people with hypertension should eat less than 1,500 mg of sodium a day. Do not drink alcohol if: Your health care provider tells you not to drink. You are pregnant, may be pregnant, or are planning to become pregnant. If you drink alcohol: Limit how much you have to: 0-1 drink a day for women. 0-2 drinks a day for men. Know how much alcohol is in your drink. In the [place] one drink equals one 12 oz bottle of beer (355 mL), one 5 oz glass of wine (148 mL), or one 1 oz glass of hard liquor (44 mL). Lifestyle  Work with your health care provider to maintain a healthy body weight or to lose weight. Ask what an ideal weight is for you. Get at least 30 minutes of exercise that causes your heart to beat faster (aerobic exercise) most days of the week. Activities may include walking, swimming, or biking. Include exercise to strengthen your muscles (resistance exercise), such as Pilates or lifting weights, as part of your weekly exercise routine. Try to do these types of exercises for 30 minutes at least 3 days a week. Do not use any products that contain nicotine or tobacco. These products include cigarettes, chewing tobacco, and vaping devices, such as e-cigarettes. If you need help quitting, ask your health care provider. Monitor your blood pressure at home as told by your health care provider. Keep all follow-up visits. This is important. Medicines Take over-the-counter and prescription medicines only as told by your health care provider. Follow directions carefully. Blood  pressure medicines must be taken as prescribed. Do not skip doses of blood pressure medicine. Doing this puts you at risk for problems and can make the medicine less effective. Ask your health care provider about side effects or reactions to medicines that you should watch for. Contact a health care provider if you: Think you are having a reaction to a medicine you are taking. Have headaches that keep coming back (recurring). Feel dizzy. Have swelling in your ankles. Have trouble with your vision. Get help right away if you: Develop a severe headache or confusion. Have unusual weakness or numbness. Feel faint. Have severe pain in your chest or abdomen. Vomit repeatedly. Have trouble breathing. These symptoms may be an emergency. Get help right away. Call 911. Do not wait to see if the symptoms will go away. Do not drive yourself to the hospital. Summary Hypertension is when the force of blood pumping through your arteries is too strong. If this condition is not controlled, it may put you at risk for serious complications. Your personal target blood pressure may vary depending on your medical conditions, your age, and other factors. For most people, a normal blood pressure is less than 120/80. Hypertension is treated with lifestyle changes, medicines, or a combination of both. Lifestyle changes include losing weight, eating a healthy,   low-sodium diet, exercising more, and limiting alcohol. This information is not intended to replace advice given to you by your health care provider. Make sure you discuss any questions you have with your health care provider. Document Revised: 12/08/2020 Document Reviewed: 12/08/2020 Elsevier Patient Education  2024 Elsevier Inc.  

## 2022-11-22 NOTE — Progress Notes (Unsigned)
Subjective:  Patient ID: Michael Li, male    DOB: September 29, 1955  Age: 67 y.o. MRN: [id number]  CC: Hypertension, Diabetes, and Atrial Fibrillation   HPI [name] presents for f/up -----  Outpatient Medications Prior to Visit  Medication Sig Dispense Refill   albuterol (VENTOLIN HFA) 108 (90 Base) MCG/ACT inhaler Inhale 2 puffs into the lungs every 6 (six) hours as needed for wheezing or shortness of breath. 8 g 6   alfuzosin (UROXATRAL) 10 MG 24 hr tablet Take 1 by mouth daily with breakfast 90 tablet 0   atorvastatin (LIPITOR) 10 MG tablet TAKE 1 TABLET(10 MG) BY MOUTH DAILY 90 tablet 0   BD PEN NEEDLE MICRO U/F 32G X 6 MM MISC USE 2 PEN NEEDLES DAILY 200 each 3   Continuous Blood Gluc Receiver (FREESTYLE LIBRE 14 DAY READER) DEVI 1 Act by Does not apply route daily. 2 each 5   Continuous Glucose Sensor (FREESTYLE LIBRE 14 DAY SENSOR) MISC USE AND CHANGE EVERY 14 DAYS AS DIRECTED 2 each 5   FARXIGA 10 MG TABS tablet Take 1 tablet (10 mg total) by mouth daily. 90 tablet 0   lisdexamfetamine (VYVANSE) 30 MG capsule Take 1 capsule (30 mg total) by mouth daily. 30 capsule 0   metFORMIN (GLUCOPHAGE) 1000 MG tablet Take 1 tablet (1,000 mg total) by mouth 2 (two) times daily with a meal. 180 tablet 0   omeprazole (PRILOSEC) 20 MG capsule TAKE 1 CAPSULE DAILY 90 capsule 0   rivaroxaban (XARELTO) 20 MG TABS tablet TAKE 1 TABLET(20 MG) BY MOUTH DAILY WITH SUPPER 90 tablet 0   tadalafil (CIALIS) 20 MG tablet TAKE 1 TABLET BY MOUTH DAILY AS NEEDED FOR E.D. 10 tablet 5   TOUJEO SOLOSTAR 300 UNIT/ML Solostar Pen ADMINISTER 20 UNITS UNDER THE SKIN EVERY MORNING 4.5 mL 0   metoprolol tartrate (LOPRESSOR) 25 MG tablet Take 0.5 tablets (12.5 mg total) by mouth 2 (two) times daily. 90 tablet 0   mupirocin ointment (BACTROBAN) 2 % Apply 1 Application topically 2 (two) times daily. To affected area till better 22 g 0   No facility-administered medications prior to visit.    ROS Review of  Systems  Neurological:  Positive for dizziness and light-headedness.    Objective:  BP (!) 144/70 (BP Location: Left Arm, Patient Position: Sitting, Cuff Size: Large)   Pulse 60   Temp 97.9 F (36.6 C) (Oral)   Resp 16   Ht 5\' 8"  (1.727 m)   Wt 168 lb (76.2 kg)   SpO2 95%   BMI 25.54 kg/m   BP Readings from Last 3 Encounters:  11/22/22 (!) 144/70  05/22/22 (!) 145/74  05/09/22 120/62    Wt Readings from Last 3 Encounters:  11/22/22 168 lb (76.2 kg)  05/22/22 168 lb (76.2 kg)  05/09/22 169 lb (76.7 kg)    Physical Exam Cardiovascular:     Rate and Rhythm: Bradycardia present.     Comments: EKG- SB, 47 bpm No LVH, Q waves, or ST/T waves  Musculoskeletal:     Right lower leg: No edema.     Left lower leg: No edema.     Lab Results  Component Value Date   WBC 4.9 11/22/2022   HGB 14.4 11/22/2022   HCT 42.3 11/22/2022   PLT 166.0 11/22/2022   GLUCOSE 153 (H) 11/22/2022   CHOL 110 05/09/2022   TRIG 212.0 (H) 05/09/2022   HDL 38.60 (L) 05/09/2022   LDLDIRECT 58.0 05/09/2022  LDLCALC 52 03/18/2021   ALT 35 11/22/2022   AST 33 11/22/2022   NA 140 11/22/2022   K 5.1 11/22/2022   CL 104 11/22/2022   CREATININE 0.89 11/22/2022   BUN 17 11/22/2022   CO2 28 11/22/2022   TSH 1.07 11/22/2022   PSA 2.03 05/09/2022   INR 1.0 12/08/2020   HGBA1C 6.8 (H) 11/22/2022   MICROALBUR <0.7 05/09/2022    No results found.  Assessment & Plan:  Insulin-requiring or dependent type II diabetes mellitus (HCC) -     Basic metabolic panel; Future -     Hepatic function panel; Future -     Hemoglobin A1c; Future -     AMB Referral to Pharmacy Medication Management  Essential hypertension -     Basic metabolic panel; Future -     CBC with Differential/Platelet; Future -     Hepatic function panel; Future -     TSH; Future -     EKG 12-Lead -     Metoprolol Succinate ER; Take 1 tablet (25 mg total) by mouth daily.  Dispense: 90 tablet; Refill: 0 -     AMB Referral to  Pharmacy Medication Management  Hyperlipidemia LDL goal <100 -     Hepatic function panel; Future -     TSH; Future  PAF (paroxysmal atrial fibrillation) (HCC) -     TSH; Future -     EKG 12-Lead -     Metoprolol Succinate ER; Take 1 tablet (25 mg total) by mouth daily.  Dispense: 90 tablet; Refill: 0 -     AMB Referral to Pharmacy Medication Management -     LONG TERM MONITOR (3-14 DAYS); Future  Gastric wall thickening -     Ambulatory referral to Gastroenterology  Flu vaccine need -     Flu Vaccine Trivalent High Dose (Fluad)  Erectile dysfunction due to arterial disease (HCC) -     Luteinizing hormone; Future -     Prolactin; Future -     Testosterone Total,Free,Bio, Males; Future  Bradycardia -     AMB Referral to Pharmacy Medication Management -     LONG TERM MONITOR (3-14 DAYS); Future     Follow-up: Return in about 4 months (around 03/25/2023).  Sanda Linger, MD

## 2022-11-23 ENCOUNTER — Telehealth: Payer: Self-pay

## 2022-11-23 ENCOUNTER — Other Ambulatory Visit: Payer: Self-pay | Admitting: Internal Medicine

## 2022-11-23 DIAGNOSIS — R001 Bradycardia, unspecified: Secondary | ICD-10-CM | POA: Insufficient documentation

## 2022-11-23 DIAGNOSIS — F900 Attention-deficit hyperactivity disorder, predominantly inattentive type: Secondary | ICD-10-CM

## 2022-11-23 LAB — TESTOSTERONE TOTAL,FREE,BIO, MALES
Albumin: 4.6 g/dL (ref 3.6–5.1)
Sex Hormone Binding: 37 nmol/L (ref 22–77)
Testosterone, Bioavailable: 140.1 ng/dL (ref 110.0–575.0)
Testosterone, Free: 66.7 pg/mL (ref 46.0–224.0)
Testosterone: 541 ng/dL (ref 250–827)

## 2022-11-23 LAB — PROLACTIN: Prolactin: 8.4 ng/mL (ref 2.0–18.0)

## 2022-11-23 MED ORDER — FARXIGA 10 MG PO TABS
10.0000 mg | ORAL_TABLET | Freq: Every day | ORAL | 0 refills | Status: DC
Start: 2022-11-23 — End: 2022-12-26

## 2022-11-23 MED ORDER — TOUJEO SOLOSTAR 300 UNIT/ML ~~LOC~~ SOPN: PEN_INJECTOR | SUBCUTANEOUS | 0 refills | Status: DC

## 2022-11-23 MED ORDER — LISDEXAMFETAMINE DIMESYLATE 30 MG PO CAPS: 30.0000 mg | ORAL_CAPSULE | Freq: Every day | ORAL | 0 refills | Status: DC

## 2022-11-23 MED ORDER — RIVAROXABAN 20 MG PO TABS
ORAL_TABLET | ORAL | 0 refills | Status: DC
Start: 2022-11-23 — End: 2022-12-26

## 2022-11-23 MED ORDER — OMEPRAZOLE 20 MG PO CPDR
20.0000 mg | DELAYED_RELEASE_CAPSULE | Freq: Every day | ORAL | 0 refills | Status: DC
Start: 2022-11-23 — End: 2022-12-26

## 2022-11-23 MED ORDER — METFORMIN HCL ER 750 MG PO TB24: 1500.0000 mg | ORAL_TABLET | Freq: Every day | ORAL | 0 refills | Status: DC

## 2022-11-23 MED ORDER — ALFUZOSIN HCL ER 10 MG PO TB24: ORAL_TABLET | ORAL | 0 refills | Status: DC

## 2022-11-23 MED ORDER — ATORVASTATIN CALCIUM 10 MG PO TABS: ORAL_TABLET | ORAL | 0 refills | Status: DC

## 2022-11-23 MED ORDER — TADALAFIL 20 MG PO TABS: ORAL_TABLET | ORAL | 5 refills | Status: DC

## 2022-11-23 NOTE — Progress Notes (Signed)
   Care Guide Note  11/23/2022 Name: Michael Li MRN: [id number] DOB: Oct 05, 1955  Referred by: Etta Grandchild, MD Reason for referral : Care Coordination (Outreach to schedule with pharmd )   Michael Li is a 67 y.o. year old male who is a primary care patient of Etta Grandchild, MD. Michael Li was referred to the pharmacist for assistance related to HTN and DM.    An unsuccessful telephone outreach was attempted today to contact the patient who was referred to the pharmacy team for assistance with medication management. Additional attempts will be made to contact the patient.   Penne Lash, RMA Care Guide [facility]  [place], [place] [address] Direct Dial: [phone] [email or web address]

## 2022-11-23 NOTE — Progress Notes (Signed)
   Care Guide Note  11/23/2022 Name: Michael Li MRN: [id number] DOB: 04/30/1955  Referred by: Etta Grandchild, MD Reason for referral : Care Coordination (Outreach to schedule with pharmd )   Michael Li is a 67 y.o. year old male who is a primary care patient of Etta Grandchild, MD. Michael Li was referred to the pharmacist for assistance related to HTN and DM.    Successful contact was made with the patient to discuss pharmacy services including being ready for the pharmacist to call at least 5 minutes before the scheduled appointment time, to have medication bottles and any blood sugar or blood pressure readings ready for review. The patient agreed to meet with the pharmacist via with the pharmacist via telephone visit on (date/time).  11/28/2022  Penne Lash, RMA Care Guide [facility]  [place], [place] [address] Direct Dial: [phone] [email or web address]

## 2022-11-26 ENCOUNTER — Encounter: Payer: Self-pay | Admitting: Internal Medicine

## 2022-11-26 ENCOUNTER — Other Ambulatory Visit: Payer: Self-pay | Admitting: Internal Medicine

## 2022-11-26 DIAGNOSIS — I480 Paroxysmal atrial fibrillation: Secondary | ICD-10-CM | POA: Diagnosis not present

## 2022-11-26 DIAGNOSIS — R001 Bradycardia, unspecified: Secondary | ICD-10-CM

## 2022-11-26 DIAGNOSIS — E119 Type 2 diabetes mellitus without complications: Secondary | ICD-10-CM

## 2022-11-28 ENCOUNTER — Other Ambulatory Visit: Payer: 59 | Admitting: Pharmacist

## 2022-11-28 DIAGNOSIS — I10 Essential (primary) hypertension: Secondary | ICD-10-CM

## 2022-11-28 DIAGNOSIS — E119 Type 2 diabetes mellitus without complications: Secondary | ICD-10-CM

## 2022-11-28 NOTE — Patient Instructions (Signed)
It was a pleasure speaking with you today!  After looking into your medications, it looks like metoprolol, alfuzosin, and tadalafil can all cause runny nose.  Dr. Yetta Barre made recent changes: change metformin to taking 750 mg tablets 2 tablets daily and metoprolol succinate 25 mg once daily to replace metoprolol tartrate 12.5 mg twice daily.  Please check your blood pressure daily over the next 2 weeks and keep a log. I will call back in 2 weeks to go over the readings.  Arbutus Leas, PharmD, BCPS [facility] [phone]

## 2022-11-28 NOTE — Progress Notes (Signed)
   11/28/2022 Name: Michael Li MRN: [id number] DOB: 15-May-1955  No chief complaint on file.   Michael Li is a 67 y.o. year old male who presented for a telephone visit.   They were referred to the pharmacist by their PCP for assistance in managing diabetes and hypertension.   Check bP x2 weeks then f/u for readings Pt asked if any meds can cause runny nose? Rhinitis: Alfuzosin, metoprolol, cialis   Subjective:  Care Team: Primary Care Provider: Etta Grandchild, MD ; Next Scheduled Visit: 12/21/2022   Medication Access/Adherence  Current Pharmacy:  [id number] [place], [place] - [address] AT [facility] OF [address]  [address] Phone: [phone] Fax: [phone]  [facility] - [place], [place] - [address] [place] [address] Phone: [phone] Fax: [phone]   Patient reports affordability concerns with their medications: No  Patient reports access/transportation concerns to their pharmacy: No  Patient reports adherence concerns with their medications:  No     Diabetes:  Current medications: Farxiga 10 mg daily, metformin XR 750 mg 2 tabs daily (recent change), Toujeo 20 units daily  Medications tried in the past:    Using Freestyle Libre meter    Patient denies hypoglycemic s/sx including dizziness, shakiness, sweating.  Patient denies hyperglycemic symptoms including polyuria, polydipsia, polyphagia, nocturia, neuropathy, blurred vision.   Hypertension:  Current medications: metoprolol succinate 25 mg daily (primarily for afib) Medications previously tried:   Patient has a validated, automated, upper arm home BP cuff Current blood pressure readings readings: has not checked at home lately. BP Readings from Last 3 Encounters:  11/22/22 (!) 144/70  05/22/22 (!) 145/74  05/09/22 120/62      Current meal patterns: tries to  reduce sodium intake,  however has been eating out a lot lately   Objective:  Lab Results  Component Value Date   HGBA1C 6.8 (H) 11/22/2022    Lab Results  Component Value Date   CREATININE 0.89 11/22/2022   BUN 17 11/22/2022   NA 140 11/22/2022   K 5.1 11/22/2022   CL 104 11/22/2022   CO2 28 11/22/2022    Lab Results  Component Value Date   CHOL 110 05/09/2022   HDL 38.60 (L) 05/09/2022   LDLCALC 52 03/18/2021   LDLDIRECT 58.0 05/09/2022   TRIG 212.0 (H) 05/09/2022   CHOLHDL 3 05/09/2022    Medications Reviewed Today   Medications were not reviewed in this encounter       Assessment/Plan:   Diabetes: - Currently controlled, A1c goal <7.0% - Reviewed goal A1c - Reviewed med change with metformin change to XR 750 mg tabs - Recommend to continue current regimen    Hypertension: - Currently uncontrolled, BP goal <130/80 - Reviewed dietary modifications, reducing sodium -Recommended to check home blood pressure and heart rate daily for the next 2 weeks - Recommend to continue current regimen. Consider thiazide diuretic if needed. Avoiding ACE/ARB due to K on higher end of normal.   Meds that may cause runny nose include: alfuzosin, metoprolol, and tadalafil   Follow Up Plan: 10/30 telephone follow up  Arbutus Leas, PharmD, BCPS [facility] [phone]

## 2022-12-14 ENCOUNTER — Other Ambulatory Visit: Payer: 59 | Admitting: Pharmacist

## 2022-12-14 DIAGNOSIS — E119 Type 2 diabetes mellitus without complications: Secondary | ICD-10-CM

## 2022-12-14 DIAGNOSIS — I10 Essential (primary) hypertension: Secondary | ICD-10-CM

## 2022-12-14 DIAGNOSIS — Z794 Long term (current) use of insulin: Secondary | ICD-10-CM

## 2022-12-14 NOTE — Progress Notes (Signed)
   12/14/2022 Name: Michael Li MRN: [id number] DOB: Jun 15, 1955  No chief complaint on file.   Michael Li is a 67 y.o. year old male who presented for a telephone visit.   They were referred to the pharmacist by their PCP for assistance in managing diabetes and hypertension.     Subjective:  Care Team: Primary Care Provider: Etta Grandchild, MD ; Next Scheduled Visit: 12/21/2022   Medication Access/Adherence  Current Pharmacy:  [facility] #[id number] [place], [place] - [address] AT [facility] OF [address] [place] [address] Phone: [phone] Fax: [phone]  [facility] - [place], [place] - [address] [place] [address] Phone: [phone] Fax: [phone]   Patient reports affordability concerns with their medications: No  Patient reports access/transportation concerns to their pharmacy: No  Patient reports adherence concerns with their medications:  No     Diabetes:  Current medications: Farxiga 10 mg daily, metformin XR 750 mg 2 tabs daily (recent change), Toujeo 20 units daily  Medications tried in the past:    Using Freestyle Libre meter    Patient denies hypoglycemic s/sx including dizziness, shakiness, sweating.  Patient denies hyperglycemic symptoms including polyuria, polydipsia, polyphagia, nocturia, neuropathy, blurred vision.   Hypertension:  Current medications: metoprolol succinate 25 mg daily (primarily for afib) Medications previously tried:   Patient has a validated, automated, upper arm home BP cuff Current blood pressure readings readings: BP 130/85, 140/70, maybe has been some in the 120s systolic  Reports continued dizzy spells with change in position. Started about a month ago.  BP Readings from Last 3 Encounters:  11/22/22 (!) 144/70  05/22/22 (!) 145/74  05/09/22 120/62      Current meal patterns: tries to reduce  sodium intake,  however has been eating out a lot lately   Objective:  Lab Results  Component Value Date   HGBA1C 6.8 (H) 11/22/2022    Lab Results  Component Value Date   CREATININE 0.89 11/22/2022   BUN 17 11/22/2022   NA 140 11/22/2022   K 5.1 11/22/2022   CL 104 11/22/2022   CO2 28 11/22/2022    Lab Results  Component Value Date   CHOL 110 05/09/2022   HDL 38.60 (L) 05/09/2022   LDLCALC 52 03/18/2021   LDLDIRECT 58.0 05/09/2022   TRIG 212.0 (H) 05/09/2022   CHOLHDL 3 05/09/2022    Medications Reviewed Today   Medications were not reviewed in this encounter       Assessment/Plan:   Diabetes: - Currently controlled, A1c goal <7.0% - Reviewed goal A1c - Reviewed med change with metformin change to XR 750 mg tabs - Recommend to continue current regimen   Hypertension: - Currently uncontrolled, BP goal <130/80 - Reviewed dietary modifications, reducing sodium - Not inclined to change BP regimen at this time due to borderline readings at home and reports of dizziness - Recommend to continue current regimen. Consider thiazide diuretic if needed. Avoiding ACE/ARB due to K on higher end of normal.  - Pt will be in office next week    Follow Up Plan: PRN  Arbutus Leas, PharmD, BCPS [facility] [phone]

## 2022-12-21 ENCOUNTER — Ambulatory Visit: Payer: 59 | Admitting: Internal Medicine

## 2022-12-21 VITALS — BP 130/68 | HR 61 | Temp 98.0°F | Resp 16 | Ht 68.0 in | Wt 164.0 lb

## 2022-12-21 DIAGNOSIS — I480 Paroxysmal atrial fibrillation: Secondary | ICD-10-CM

## 2022-12-21 DIAGNOSIS — E119 Type 2 diabetes mellitus without complications: Secondary | ICD-10-CM

## 2022-12-21 DIAGNOSIS — K3189 Other diseases of stomach and duodenum: Secondary | ICD-10-CM | POA: Diagnosis not present

## 2022-12-21 DIAGNOSIS — Z794 Long term (current) use of insulin: Secondary | ICD-10-CM

## 2022-12-21 NOTE — Progress Notes (Unsigned)
Subjective:  Patient ID: Michael Li, male    DOB: 17-Nov-1955  Age: 67 y.o. MRN: [id number]  CC: No chief complaint on file.   HPI Michael Li presents for ***  Outpatient Medications Prior to Visit  Medication Sig Dispense Refill   albuterol (VENTOLIN HFA) 108 (90 Base) MCG/ACT inhaler Inhale 2 puffs into the lungs every 6 (six) hours as needed for wheezing or shortness of breath. 8 g 6   alfuzosin (UROXATRAL) 10 MG 24 hr tablet Take 1 by mouth daily with breakfast 90 tablet 0   atorvastatin (LIPITOR) 10 MG tablet TAKE 1 TABLET(10 MG) BY MOUTH DAILY 90 tablet 0   BD PEN NEEDLE MICRO U/F 32G X 6 MM MISC USE 2 PEN NEEDLES DAILY 200 each 3   Continuous Glucose Receiver (FREESTYLE LIBRE 14 DAY READER) DEVI USE DAILY AS DIRECTED 2 each 5   Continuous Glucose Sensor (FREESTYLE LIBRE 14 DAY SENSOR) MISC USE AND CHANGE EVERY 14 DAYS AS DIRECTED 2 each 5   FARXIGA 10 MG TABS tablet Take 1 tablet (10 mg total) by mouth daily. 90 tablet 0   insulin glargine, 1 Unit Dial, (TOUJEO SOLOSTAR) 300 UNIT/ML Solostar Pen ADMINISTER 20 UNITS UNDER THE SKIN EVERY MORNING 4.5 mL 0   lisdexamfetamine (VYVANSE) 30 MG capsule Take 1 capsule (30 mg total) by mouth daily. 30 capsule 0   metFORMIN (GLUCOPHAGE-XR) 750 MG 24 hr tablet Take 2 tablets (1,500 mg total) by mouth daily with breakfast. 180 tablet 0   metoprolol succinate (TOPROL-XL) 25 MG 24 hr tablet Take 1 tablet (25 mg total) by mouth daily. 90 tablet 0   omeprazole (PRILOSEC) 20 MG capsule Take 1 capsule (20 mg total) by mouth daily. 90 capsule 0   rivaroxaban (XARELTO) 20 MG TABS tablet TAKE 1 TABLET(20 MG) BY MOUTH DAILY WITH SUPPER 90 tablet 0   tadalafil (CIALIS) 20 MG tablet TAKE 1 TABLET BY MOUTH DAILY AS NEEDED FOR E.D. 10 tablet 5   No facility-administered medications prior to visit.    ROS Review of Systems  Objective:  BP 114/68 (BP Location: Left Arm, Patient Position: Sitting, Cuff Size: Normal)   Pulse 61   Temp 98 F  (36.7 C) (Oral)   Ht 5\' 8"  (1.727 m)   Wt 164 lb (74.4 kg)   SpO2 94%   BMI 24.94 kg/m   BP Readings from Last 3 Encounters:  12/21/22 114/68  11/22/22 (!) 144/70  05/22/22 (!) 145/74    Wt Readings from Last 3 Encounters:  12/21/22 164 lb (74.4 kg)  11/22/22 168 lb (76.2 kg)  05/22/22 168 lb (76.2 kg)    Physical Exam  Lab Results  Component Value Date   WBC 4.9 11/22/2022   HGB 14.4 11/22/2022   HCT 42.3 11/22/2022   PLT 166.0 11/22/2022   GLUCOSE 153 (H) 11/22/2022   CHOL 110 05/09/2022   TRIG 212.0 (H) 05/09/2022   HDL 38.60 (L) 05/09/2022   LDLDIRECT 58.0 05/09/2022   LDLCALC 52 03/18/2021   ALT 35 11/22/2022   AST 33 11/22/2022   NA 140 11/22/2022   K 5.1 11/22/2022   CL 104 11/22/2022   CREATININE 0.89 11/22/2022   BUN 17 11/22/2022   CO2 28 11/22/2022   TSH 1.07 11/22/2022   PSA 2.03 05/09/2022   INR 1.0 12/08/2020   HGBA1C 6.8 (H) 11/22/2022   MICROALBUR <0.7 05/09/2022    No results found.  Assessment & Plan:  There are no diagnoses linked to  this encounter.   Follow-up: No follow-ups on file.  Sanda Linger, MD

## 2022-12-22 ENCOUNTER — Encounter: Payer: Self-pay | Admitting: Internal Medicine

## 2022-12-26 ENCOUNTER — Other Ambulatory Visit: Payer: Self-pay | Admitting: Internal Medicine

## 2022-12-26 DIAGNOSIS — E785 Hyperlipidemia, unspecified: Secondary | ICD-10-CM

## 2022-12-26 DIAGNOSIS — I480 Paroxysmal atrial fibrillation: Secondary | ICD-10-CM

## 2022-12-26 DIAGNOSIS — I10 Essential (primary) hypertension: Secondary | ICD-10-CM

## 2022-12-26 DIAGNOSIS — F900 Attention-deficit hyperactivity disorder, predominantly inattentive type: Secondary | ICD-10-CM

## 2022-12-26 DIAGNOSIS — K219 Gastro-esophageal reflux disease without esophagitis: Secondary | ICD-10-CM

## 2022-12-26 DIAGNOSIS — E119 Type 2 diabetes mellitus without complications: Secondary | ICD-10-CM

## 2022-12-26 DIAGNOSIS — N401 Enlarged prostate with lower urinary tract symptoms: Secondary | ICD-10-CM

## 2022-12-27 MED ORDER — RIVAROXABAN 20 MG PO TABS: ORAL_TABLET | ORAL | 0 refills | Status: DC

## 2022-12-27 MED ORDER — OMEPRAZOLE 20 MG PO CPDR: 20.0000 mg | DELAYED_RELEASE_CAPSULE | Freq: Every day | ORAL | 0 refills | Status: DC

## 2022-12-27 MED ORDER — LISDEXAMFETAMINE DIMESYLATE 30 MG PO CAPS: 30.0000 mg | ORAL_CAPSULE | Freq: Every day | ORAL | 0 refills | Status: DC

## 2022-12-27 MED ORDER — METFORMIN HCL ER 750 MG PO TB24: 1500.0000 mg | ORAL_TABLET | Freq: Every day | ORAL | 0 refills | Status: DC

## 2022-12-27 MED ORDER — ATORVASTATIN CALCIUM 10 MG PO TABS
ORAL_TABLET | ORAL | 0 refills | Status: DC
Start: 2022-12-27 — End: 2023-01-27

## 2022-12-27 MED ORDER — TOUJEO SOLOSTAR 300 UNIT/ML ~~LOC~~ SOPN
PEN_INJECTOR | SUBCUTANEOUS | 0 refills | Status: DC
Start: 2022-12-27 — End: 2023-01-27

## 2022-12-27 MED ORDER — ALFUZOSIN HCL ER 10 MG PO TB24: ORAL_TABLET | ORAL | 0 refills | Status: DC

## 2022-12-27 MED ORDER — METOPROLOL SUCCINATE ER 25 MG PO TB24: 25.0000 mg | ORAL_TABLET | Freq: Every day | ORAL | 0 refills | Status: DC

## 2022-12-27 MED ORDER — FARXIGA 10 MG PO TABS
10.0000 mg | ORAL_TABLET | Freq: Every day | ORAL | 0 refills | Status: DC
Start: 2022-12-27 — End: 2023-01-27

## 2023-01-23 ENCOUNTER — Telehealth: Payer: Self-pay | Admitting: Internal Medicine

## 2023-01-23 ENCOUNTER — Other Ambulatory Visit: Payer: Self-pay | Admitting: Internal Medicine

## 2023-01-23 DIAGNOSIS — K219 Gastro-esophageal reflux disease without esophagitis: Secondary | ICD-10-CM

## 2023-01-23 NOTE — Telephone Encounter (Signed)
Noted  

## 2023-01-23 NOTE — Telephone Encounter (Signed)
Surgical clearance received from [facility] via fax and placed in provider's box up front.

## 2023-01-25 NOTE — Telephone Encounter (Signed)
[facility] called for an update and is requesting we return it to them quickly, they are trying to schedule the pt for a stat procedure.

## 2023-01-26 ENCOUNTER — Telehealth: Payer: Self-pay | Admitting: Cardiology

## 2023-01-26 ENCOUNTER — Ambulatory Visit (HOSPITAL_COMMUNITY)
Admission: RE | Admit: 2023-01-26 | Discharge: 2023-01-26 | Disposition: A | Discharge status: Home / Self Care | Payer: 59 | Source: Ambulatory Visit | Attending: Pulmonary Disease | Admitting: Pulmonary Disease

## 2023-01-26 DIAGNOSIS — R911 Solitary pulmonary nodule: Secondary | ICD-10-CM | POA: Insufficient documentation

## 2023-01-26 NOTE — Telephone Encounter (Signed)
Spoke with [facility] and they did confirm that they did receive the clearance.

## 2023-01-26 NOTE — Telephone Encounter (Signed)
Clearance request has been received and will be entered into the chart and sent to preop

## 2023-01-26 NOTE — Telephone Encounter (Signed)
   Pre-operative Risk Assessment    Patient Name: Michael Li  DOB: March 11, 1955 MRN: [id number]    DATE OF LAST VISIT: 11/12/20 Otilio Saber, PAC DATE OF NEXT VISIT: NONE Request for Surgical Clearance    Procedure:   FULL ARCH EXTRACTIONS WITH 4 DENTAL IMPLANTS; WILL NEED TO CONFIRM IF SURGICAL OR SIMPLE EXTRACTIONS  Date of Surgery:  Clearance TBD                                 Surgeon:  DR. Sherral Hammers, DDS Surgeon's Group or Practice Name:  [facility] Phone number:  [phone] Fax number:  [phone]   Type of Clearance Requested:   - Medical  - Pharmacy:  Hold Apixaban (Eliquis) x 2 DAYS PRIOR   Type of Anesthesia:  Local    Additional requests/questions:    Elpidio Anis   01/26/2023, 5:16 PM

## 2023-01-26 NOTE — Telephone Encounter (Signed)
They faxed Dental Clearance yesterday and want to know if you received and they need it back ASAP. Please fax to [phone]

## 2023-01-27 ENCOUNTER — Other Ambulatory Visit: Payer: Self-pay | Admitting: Internal Medicine

## 2023-01-27 ENCOUNTER — Telehealth: Payer: Self-pay | Admitting: Cardiology

## 2023-01-27 DIAGNOSIS — F900 Attention-deficit hyperactivity disorder, predominantly inattentive type: Secondary | ICD-10-CM

## 2023-01-27 DIAGNOSIS — N401 Enlarged prostate with lower urinary tract symptoms: Secondary | ICD-10-CM

## 2023-01-27 DIAGNOSIS — I10 Essential (primary) hypertension: Secondary | ICD-10-CM

## 2023-01-27 DIAGNOSIS — I480 Paroxysmal atrial fibrillation: Secondary | ICD-10-CM

## 2023-01-27 DIAGNOSIS — E785 Hyperlipidemia, unspecified: Secondary | ICD-10-CM

## 2023-01-27 DIAGNOSIS — E119 Type 2 diabetes mellitus without complications: Secondary | ICD-10-CM

## 2023-01-27 DIAGNOSIS — K219 Gastro-esophageal reflux disease without esophagitis: Secondary | ICD-10-CM

## 2023-01-27 MED ORDER — ATORVASTATIN CALCIUM 10 MG PO TABS: ORAL_TABLET | ORAL | 0 refills | Status: DC

## 2023-01-27 MED ORDER — RIVAROXABAN 20 MG PO TABS: ORAL_TABLET | ORAL | 0 refills | Status: DC

## 2023-01-27 MED ORDER — OMEPRAZOLE 20 MG PO CPDR: 20.0000 mg | DELAYED_RELEASE_CAPSULE | Freq: Every day | ORAL | 0 refills | Status: DC

## 2023-01-27 MED ORDER — FARXIGA 10 MG PO TABS: 10.0000 mg | ORAL_TABLET | Freq: Every day | ORAL | 0 refills | Status: DC

## 2023-01-27 MED ORDER — LISDEXAMFETAMINE DIMESYLATE 30 MG PO CAPS: 30.0000 mg | ORAL_CAPSULE | Freq: Every day | ORAL | 0 refills | Status: DC

## 2023-01-27 MED ORDER — ALFUZOSIN HCL ER 10 MG PO TB24: ORAL_TABLET | ORAL | 0 refills | Status: DC

## 2023-01-27 MED ORDER — TOUJEO SOLOSTAR 300 UNIT/ML ~~LOC~~ SOPN: PEN_INJECTOR | SUBCUTANEOUS | 0 refills | Status: DC

## 2023-01-27 MED ORDER — METFORMIN HCL ER 750 MG PO TB24: 1500.0000 mg | ORAL_TABLET | Freq: Every day | ORAL | 0 refills | Status: DC

## 2023-01-27 MED ORDER — METOPROLOL SUCCINATE ER 25 MG PO TB24: 25.0000 mg | ORAL_TABLET | Freq: Every day | ORAL | 0 refills | Status: DC

## 2023-01-27 NOTE — Telephone Encounter (Signed)
Patient called to follow-up on the status of his clearance.  Patient stated he is having implants and will not have any anesthesia.

## 2023-01-27 NOTE — Telephone Encounter (Signed)
Returned call to pt.  He is aware we are waiting on clarification from the Dentist office to process his clearance.  Per pt, he will give them a call as well.

## 2023-01-27 NOTE — Telephone Encounter (Signed)
Lvm for dental office to call back. Team needs to know if the surgery is surgical or simple extractions.

## 2023-01-30 ENCOUNTER — Ambulatory Visit: Payer: 59 | Attending: Cardiology | Admitting: Cardiology

## 2023-01-30 ENCOUNTER — Encounter: Payer: Self-pay | Admitting: Cardiology

## 2023-01-30 ENCOUNTER — Telehealth: Payer: Self-pay | Admitting: Cardiology

## 2023-01-30 VITALS — BP 120/70 | HR 63 | Resp 16 | Ht 68.0 in | Wt 157.2 lb

## 2023-01-30 DIAGNOSIS — Z01810 Encounter for preprocedural cardiovascular examination: Secondary | ICD-10-CM | POA: Diagnosis not present

## 2023-01-30 DIAGNOSIS — I2510 Atherosclerotic heart disease of native coronary artery without angina pectoris: Secondary | ICD-10-CM

## 2023-01-30 DIAGNOSIS — R931 Abnormal findings on diagnostic imaging of heart and coronary circulation: Secondary | ICD-10-CM

## 2023-01-30 DIAGNOSIS — Z7901 Long term (current) use of anticoagulants: Secondary | ICD-10-CM

## 2023-01-30 DIAGNOSIS — Z87891 Personal history of nicotine dependence: Secondary | ICD-10-CM

## 2023-01-30 DIAGNOSIS — J869 Pyothorax without fistula: Secondary | ICD-10-CM

## 2023-01-30 DIAGNOSIS — I2584 Coronary atherosclerosis due to calcified coronary lesion: Secondary | ICD-10-CM

## 2023-01-30 DIAGNOSIS — I4819 Other persistent atrial fibrillation: Secondary | ICD-10-CM | POA: Diagnosis not present

## 2023-01-30 MED ORDER — FENOFIBRATE 145 MG PO TABS: 145.0000 mg | ORAL_TABLET | Freq: Every day | ORAL | 3 refills | Status: DC

## 2023-01-30 NOTE — Progress Notes (Signed)
Cardiology Office Note:    Date:  01/30/2023  NAME:  Michael Li    MRN: [id number] DOB:  29-Nov-1955   PCP:  Etta Grandchild, MD  Former Cardiology Providers: None  Primary Cardiologist:  Tessa Lerner, DO, [facility] (established care 01/30/2023) Electrophysiologist:  Will Jorja Loa, MD   Referring MD: Etta Grandchild, MD  Reason of Consult: Preoperative clearance  Chief Complaint  Patient presents with   Pre-op Exam    History of Present Illness:    Michael Li is a 67 y.o. Caucasian male whose past medical history and cardiovascular risk factors includes: Severe coronary artery calcification, aortic atherosclerosis, coronary artery disease, emphysema, recovered cardiomyopathy, history of left atrial appendage thrombus February 2021, type 2 diabetes, history of hepatitis C status posttreatment, former smoker. He is being seen today for the evaluation of pre-op for dental extraction and implant at the request of Etta Grandchild, MD.  Patient presents today for preoperative risk stratification for prior to dental tractions and implants.   Tentative date of surgery/procedure is 01/31/2023.  Denies anginal chest pain or heart failure symptoms.  He has baseline shortness of breath with over exertional activities but this is chronic and stable.  He exercises on a regular basis as he enjoys walking a couple miles a day.  No change in physical endurance.  His last dose of [place] and Xarelto was 01/29/2023.   During today's office visit we also reached out to his dental office and can firmed that the type of anesthesia is going to be local.   Current Medications: Current Meds  Medication Sig   albuterol (VENTOLIN HFA) 108 (90 Base) MCG/ACT inhaler Inhale 2 puffs into the lungs every 6 (six) hours as needed for wheezing or shortness of breath.   alfuzosin (UROXATRAL) 10 MG 24 hr tablet Take 1 by mouth daily with breakfast   atorvastatin (LIPITOR) 10 MG tablet TAKE 1 TABLET(10  MG) BY MOUTH DAILY   BD PEN NEEDLE MICRO U/F 32G X 6 MM MISC USE 2 PEN NEEDLES DAILY   Continuous Glucose Receiver (FREESTYLE LIBRE 14 DAY READER) DEVI USE DAILY AS DIRECTED   Continuous Glucose Sensor (FREESTYLE LIBRE 14 DAY SENSOR) MISC USE AND CHANGE EVERY 14 DAYS AS DIRECTED   FARXIGA 10 MG TABS tablet Take 1 tablet (10 mg total) by mouth daily.   fenofibrate (TRICOR) 145 MG tablet Take 1 tablet (145 mg total) by mouth daily.   insulin glargine, 1 Unit Dial, (TOUJEO SOLOSTAR) 300 UNIT/ML Solostar Pen ADMINISTER 20 UNITS UNDER THE SKIN EVERY MORNING (Patient taking differently: Inject 22 Units into the skin daily. ADMINISTER 20 UNITS UNDER THE SKIN EVERY MORNING)   lisdexamfetamine (VYVANSE) 30 MG capsule Take 1 capsule (30 mg total) by mouth daily.   metFORMIN (GLUCOPHAGE-XR) 750 MG 24 hr tablet Take 2 tablets (1,500 mg total) by mouth daily with breakfast.   metoprolol succinate (TOPROL-XL) 25 MG 24 hr tablet Take 1 tablet (25 mg total) by mouth daily.   omeprazole (PRILOSEC) 20 MG capsule Take 1 capsule (20 mg total) by mouth daily.   rivaroxaban (XARELTO) 20 MG TABS tablet TAKE 1 TABLET(20 MG) BY MOUTH DAILY WITH SUPPER   tadalafil (CIALIS) 20 MG tablet TAKE 1 TABLET BY MOUTH DAILY AS NEEDED FOR E.D.     Allergies:    Cephalexin and Edarbi [azilsartan]   Past Medical History: Past Medical History:  Diagnosis Date   Anemia    rec'd 32 units of blood, post T&A,  1981   ATTENTION DEFICIT HYPERACTIVITY DISORDER 07/19/2006   BENIGN PROSTATIC HYPERTROPHY 07/19/2006   DIABETES MELLITUS, TYPE II 07/19/2006   Dyspnea    had shortness of breath when heart was irregular   Dysrhythmia    history of a-fib   ERECTILE DYSFUNCTION 07/19/2006   GERD 07/19/2006   HEPATITIS C, CHRONIC 04/03/2007   Treated with [place]    History of blood transfusion    NEOPLASM, MALIGNANT, CARCINOMA, BASAL CELL, NOSE 10/20/2009   Basal cell    Past Surgical History: Past Surgical History:  Procedure Laterality Date    APPENDECTOMY     ATRIAL FIBRILLATION ABLATION N/A 03/27/2019   Procedure: ATRIAL FIBRILLATION ABLATION;  Surgeon: Regan Lemming, MD;  Location: [facility] INVASIVE CV LAB;  Service: Cardiovascular;  Laterality: N/A;   ATRIAL FIBRILLATION ABLATION N/A 06/19/2019   Procedure: ATRIAL FIBRILLATION ABLATION;  Surgeon: Regan Lemming, MD;  Location: [facility] INVASIVE CV LAB;  Service: Cardiovascular;  Laterality: N/A;   carotid injury       post tonsillectomy, R carotid injury with trach   HERNIA REPAIR  1973   inguinal- R side   KNEE SURGERY Right    LUMBAR LAMINECTOMY/DECOMPRESSION MICRODISCECTOMY Right 09/12/2012   Procedure: LUMBAR LAMINECTOMY/DECOMPRESSION MICRODISCECTOMY 1 LEVEL;  Surgeon: Temple Pacini, MD;  Location: [facility] NEURO ORS;  Service: Neurosurgery;  Laterality: Right;  Right lumbar three-four microdiscectomy   ORIF SHOULDER FRACTURE Right 06/02/2017   Procedure: OPEN REDUCTION INTERNAL FIXATION (ORIF) RIGHT GLENOID FRACTURE, POSSIBLE LATARJET CORACOID TRANSFER;  Surgeon: Cammy Copa, MD;  Location: [facility] OR;  Service: Orthopedics;  Laterality: Right;   TEE WITHOUT CARDIOVERSION N/A 06/19/2019   Procedure: TRANSESOPHAGEAL ECHOCARDIOGRAM (TEE);  Surgeon: Vesta Mixer, MD;  Location: [facility] INVASIVE CV LAB;  Service: Cardiovascular;  Laterality: N/A;   TONSILLECTOMY     TRACHEOSTOMY CLOSURE  1981   UMBILICAL HERNIA REPAIR N/A 12/08/2020   Procedure: HERNIA REPAIR UMBILICAL;  Surgeon: Kinsinger, De Blanch, MD;  Location: [facility] ORS;  Service: General;  Laterality: N/A;    Social History: Social History   Tobacco Use   Smoking status: Former    Current packs/day: 0.00    Types: Cigarettes    Start date: 1973    Quit date: 2007    Years since quitting: 17.9    Passive exposure: Past   Smokeless tobacco: Former    Types: Chew    Quit date: 02/15/2007  Vaping Use   Vaping status: Never Used  Substance Use Topics   Alcohol use: Yes    Comment: 3 glasses of wine per week    Drug use:  Yes    Types: Marijuana    Comment: gummies occasionally    Family History: Family History  Problem Relation Age of Onset   COPD Mother    Heart attack Father    Diabetes Maternal Aunt    Cancer Paternal Grandmother        colon cancer   Diabetes Maternal Aunt     ROS:   Review of Systems  Cardiovascular:  Positive for dyspnea on exertion (chronic and stable). Negative for chest pain, claudication, irregular heartbeat, leg swelling, near-syncope, orthopnea, palpitations, paroxysmal nocturnal dyspnea and syncope.  Hematologic/Lymphatic: Negative for bleeding problem.    EKGs/Labs/Other Studies Reviewed:   EKG: EKG Interpretation Date/Time:  Monday January 30 2023 14:33:38 EST Ventricular Rate:  56 PR Interval:  180 QRS Duration:  96 QT Interval:  402 QTC Calculation: 387 R Axis:   49  Text Interpretation: Sinus  bradycardia When compared with ECG of 17-Jul-2019 08:37, No significant change was found Confirmed by Tessa Lerner [phone]) on 01/30/2023 2:35:23 PM  Echocardiogram: TEE 03/27/2019 Left atrial size was moderately dilated. A left atrial/left atrial appendage thrombus was detected.   TEE 06/24/2019 LVEF 35-40%, global hypokinesis. No left atrial/left atrial appendage thrombus was detected.  See report for additional details  11/2020  1. Left ventricular ejection fraction by 3D volume is 60 %. The left  ventricle has normal function. The left ventricle has no regional wall  motion abnormalities. Left ventricular diastolic parameters were normal.   2. Right ventricular systolic function is normal. The right ventricular  size is normal. There is normal pulmonary artery systolic pressure. The  estimated right ventricular systolic pressure is 29.0 mmHg.   3. The mitral valve is normal in structure. Trivial mitral valve  regurgitation. No evidence of mitral stenosis.   4. The aortic valve is tricuspid. Aortic valve regurgitation is not  visualized. No aortic  stenosis is present.   5. The inferior vena cava is normal in size with greater than 50%  respiratory variability, suggesting right atrial pressure of 3 mmHg.   CT cardiac morphology/pulmonary vein: 03/2019 1.  Possible LA appendage thrombus.   2.  Pulmonary veins as noted above.   3. Coronary artery calcium score 2254 Agatston units. This places the patient in the 98th percentile for age and gender, suggesting high risk for future cardiac events.   4. Extensive plaque in the proximal to mid LAD, looks to be around 50% stenosis at most but will confirm by FFR.   5. Extensive proximal to mid LCx plaque, looks < 50% stenosis but will confirm by FFR.   6. Motion artifact degrades RCA images, but I am concerned for severe mid RCA stenosis. Probably will not be able to perform FFR on this vessel due to artifact.  CTFFR 03/2019 1.  The RCA could not be analyzed due to artifact.   2.  No evidence for hemodynamically significant disease in the LAD.   3. Probably no hemodynamically significant disease in the LCx. The distal LCx FFR is borderline but is in the distal vessel and unlikely to be significant.  Labs:    Latest Ref Rng & Units 11/22/2022    1:57 PM 12/02/2021   10:28 AM 10/04/2021   11:43 AM  CBC  WBC 4.0 - 10.5 K/uL 4.9  5.4  5.5   Hemoglobin 13.0 - 17.0 g/dL 91.4  78.2  95.6   Hematocrit 39.0 - 52.0 % 42.3  47.8  42.8   Platelets 150.0 - 400.0 K/uL 166.0  193.0  142.0        Latest Ref Rng & Units 11/22/2022    1:57 PM 05/10/2022    7:56 AM 12/02/2021   10:28 AM  BMP  Glucose 70 - 99 mg/dL 213  086  578   BUN 6 - 23 mg/dL 17  19  18    Creatinine 0.40 - 1.50 mg/dL 4.69  6.29  5.28   Sodium 135 - 145 mEq/L 140  139  137   Potassium 3.5 - 5.1 mEq/L 5.1  4.4  4.6   Chloride 96 - 112 mEq/L 104  102  100   CO2 19 - 32 mEq/L 28  27  28    Calcium 8.4 - 10.5 mg/dL 9.7  9.7  9.5       Latest Ref Rng & Units 11/22/2022    1:57 PM 05/10/2022    7:56  AM 12/02/2021   10:28 AM   CMP  Glucose 70 - 99 mg/dL 295  621  308   BUN 6 - 23 mg/dL 17  19  18    Creatinine 0.40 - 1.50 mg/dL 6.57  8.46  9.62   Sodium 135 - 145 mEq/L 140  139  137   Potassium 3.5 - 5.1 mEq/L 5.1  4.4  4.6   Chloride 96 - 112 mEq/L 104  102  100   CO2 19 - 32 mEq/L 28  27  28    Calcium 8.4 - 10.5 mg/dL 9.7  9.7  9.5   Total Protein 6.0 - 8.3 g/dL 6.5   7.1   Total Bilirubin 0.2 - 1.2 mg/dL 0.8   1.0   Alkaline Phos 39 - 117 U/L 85   81   AST 0 - 37 U/L 33   27   ALT 0 - 53 U/L 35   24     Lab Results  Component Value Date   CHOL 110 05/09/2022   HDL 38.60 (L) 05/09/2022   LDLCALC 52 03/18/2021   LDLDIRECT 58.0 05/09/2022   TRIG 212.0 (H) 05/09/2022   CHOLHDL 3 05/09/2022   No results for input(s): "LIPOA" in the last 8760 hours. No components found for: "NTPROBNP" No results for input(s): "PROBNP" in the last 8760 hours. Recent Labs    11/22/22 1357  TSH 1.07    Physical Exam:    Today's Vitals   01/30/23 1413  BP: 120/70  Pulse: 63  Resp: 16  SpO2: 95%  Weight: 157 lb 3.2 oz (71.3 kg)  Height: 5\' 8"  (1.727 m)   Body mass index is 23.9 kg/m. Wt Readings from Last 3 Encounters:  01/30/23 157 lb 3.2 oz (71.3 kg)  12/21/22 164 lb (74.4 kg)  11/22/22 168 lb (76.2 kg)    Physical Exam  Constitutional: No distress.  hemodynamically stable  Neck: No JVD present.  Cardiovascular: Normal rate, regular rhythm, S1 normal and S2 normal. Exam reveals no gallop, no S3 and no S4.  No murmur heard. Pulmonary/Chest: Effort normal and breath sounds normal. No stridor. He has no wheezes. He has no rales.  Abdominal: Soft. Bowel sounds are normal. He exhibits no distension. There is no abdominal tenderness.  Musculoskeletal:        General: No edema.     Cervical back: Neck supple.  Neurological: He is alert and oriented to person, place, and time. He has intact cranial nerves (2-12).  Skin: Skin is warm.     Impression & Recommendation(s):  Impression:   ICD-10-CM   1.  Preoperative cardiovascular examination  Z01.810 EKG 12-Lead    2. Persistent atrial fibrillation (HCC)  I48.19     3. Long term (current) use of anticoagulants  Z79.01     4. Coronary atherosclerosis due to calcified coronary lesion  I25.10 fenofibrate (TRICOR) 145 MG tablet   I25.84 Comprehensive metabolic panel    Lipid panel    LDL cholesterol, direct    LDL cholesterol, direct    Lipid panel    Comprehensive metabolic panel    5. Agatston CAC score, >400  R93.1 fenofibrate (TRICOR) 145 MG tablet    Comprehensive metabolic panel    Lipid panel    LDL cholesterol, direct    LDL cholesterol, direct    Lipid panel    Comprehensive metabolic panel    6. Former smoker  Z87.891     7. Empyema (HCC)  C4171301.9  Recommendation(s):  Preoperative cardiovascular examination Tentatively scheduled for dental extractions and implants 01/31/2023. His last dose of Xarelto was 11/29/2022. He needs to be off of Xarelto for 2 days prior to the procedure and the day of procedure.  He would be beneficial also to hold [place] 3 days prior to the procedure as well. EKG is nonischemic. Last echocardiogram noted preserved LVEF. Though he has cardiovascular comorbidities he is optimized from a cardiovascular standpoint and overall acceptable risk for upcoming noncardiac surgery/procedure.  Persistent atrial fibrillation (HCC) Rate control: Toprol-XL 25 mg p.o. daily Rhythm control: N/A. Thromboembolic prophylaxis: Xarelto 20 mg p.o. supper History of atrial fibrillation ablation and follows with Dr. Elberta Fortis longitudinally. EKG today illustrates sinus rhythm  Long term (current) use of anticoagulants History of persistent atrial fibrillation currently in sinus rhythm. Does not endorse evidence of bleeding. Risks, benefits, and alternatives to oral anticoagulation discussed  Coronary atherosclerosis due to calcified coronary lesion Agatston CAC score, >400 Currently not on aspirin as he  is on anticoagulation to minimize risk of bleeding. Currently on atorvastatin 10 mg p.o. nightly. Will add fenofibrate 145 mg p.o. daily with follow-up labs in 6 weeks to reevaluate therapy.  His most recent triglycerides are 212 as of March 2024. Recommend a goal LDL <55 mg/dL and a triglyceride levels close to 149 mg/dL.  Orders Placed:  Orders Placed This Encounter  Procedures   Comprehensive metabolic panel    Standing Status:   Future    Number of Occurrences:   1    Expected Date:   03/13/2023    Expiration Date:   01/30/2024   Lipid panel    Standing Status:   Future    Number of Occurrences:   1    Expected Date:   03/13/2023    Expiration Date:   01/30/2024   LDL cholesterol, direct    Standing Status:   Future    Number of Occurrences:   1    Expected Date:   03/13/2023    Expiration Date:   01/30/2024   EKG 12-Lead   Final Medication List:    Meds ordered this encounter  Medications   fenofibrate (TRICOR) 145 MG tablet    Sig: Take 1 tablet (145 mg total) by mouth daily.    Dispense:  30 tablet    Refill:  3    There are no discontinued medications.   Current Outpatient Medications:    albuterol (VENTOLIN HFA) 108 (90 Base) MCG/ACT inhaler, Inhale 2 puffs into the lungs every 6 (six) hours as needed for wheezing or shortness of breath., Disp: 8 g, Rfl: 6   alfuzosin (UROXATRAL) 10 MG 24 hr tablet, Take 1 by mouth daily with breakfast, Disp: 90 tablet, Rfl: 0   atorvastatin (LIPITOR) 10 MG tablet, TAKE 1 TABLET(10 MG) BY MOUTH DAILY, Disp: 90 tablet, Rfl: 0   BD PEN NEEDLE MICRO U/F 32G X 6 MM MISC, USE 2 PEN NEEDLES DAILY, Disp: 200 each, Rfl: 3   Continuous Glucose Receiver (FREESTYLE LIBRE 14 DAY READER) DEVI, USE DAILY AS DIRECTED, Disp: 2 each, Rfl: 5   Continuous Glucose Sensor (FREESTYLE LIBRE 14 DAY SENSOR) MISC, USE AND CHANGE EVERY 14 DAYS AS DIRECTED, Disp: 2 each, Rfl: 5   FARXIGA 10 MG TABS tablet, Take 1 tablet (10 mg total) by mouth daily., Disp: 90  tablet, Rfl: 0   fenofibrate (TRICOR) 145 MG tablet, Take 1 tablet (145 mg total) by mouth daily., Disp: 30 tablet, Rfl: 3   insulin  glargine, 1 Unit Dial, (TOUJEO SOLOSTAR) 300 UNIT/ML Solostar Pen, ADMINISTER 20 UNITS UNDER THE SKIN EVERY MORNING (Patient taking differently: Inject 22 Units into the skin daily. ADMINISTER 20 UNITS UNDER THE SKIN EVERY MORNING), Disp: 4.5 mL, Rfl: 0   lisdexamfetamine (VYVANSE) 30 MG capsule, Take 1 capsule (30 mg total) by mouth daily., Disp: 30 capsule, Rfl: 0   metFORMIN (GLUCOPHAGE-XR) 750 MG 24 hr tablet, Take 2 tablets (1,500 mg total) by mouth daily with breakfast., Disp: 180 tablet, Rfl: 0   metoprolol succinate (TOPROL-XL) 25 MG 24 hr tablet, Take 1 tablet (25 mg total) by mouth daily., Disp: 90 tablet, Rfl: 0   omeprazole (PRILOSEC) 20 MG capsule, Take 1 capsule (20 mg total) by mouth daily., Disp: 90 capsule, Rfl: 0   rivaroxaban (XARELTO) 20 MG TABS tablet, TAKE 1 TABLET(20 MG) BY MOUTH DAILY WITH SUPPER, Disp: 90 tablet, Rfl: 0   tadalafil (CIALIS) 20 MG tablet, TAKE 1 TABLET BY MOUTH DAILY AS NEEDED FOR E.D., Disp: 10 tablet, Rfl: 5  Consent:   NA  Disposition:   9-month follow-up visit. Patient may be asked to follow-up sooner based on the results of the above-mentioned testing.  His questions and concerns were addressed to his satisfaction. He voices understanding of the recommendations provided during this encounter.    Signed, Tessa Lerner, DO, [facility] [place]  [facility]  [address] [place], [place] [address] 01/30/2023 6:00 PM

## 2023-01-30 NOTE — Telephone Encounter (Signed)
   Name: Michael Li  DOB: [birth year]  MRN: [id number]  Primary Cardiologist: None  Chart reviewed as part of pre-operative protocol coverage. Because of [name] past medical history and time since last visit, he will require a follow-up in-office visit in order to better assess preoperative cardiovascular risk.  Pre-op covering staff: - Please schedule appointment and call patient to inform them. If patient already had an upcoming appointment within acceptable timeframe, please add "pre-op clearance" to the appointment notes so provider is aware. - Please contact requesting surgeon's office via preferred method (i.e, phone, fax) to inform them of need for appointment prior to surgery.  This message will also be routed to pharmacy pool for input on holding Xarelto as requested below so that this information is available to the clearing provider at time of patient's appointment.   Joylene Grapes, NP  01/30/2023, 12:35 PM

## 2023-01-30 NOTE — Telephone Encounter (Signed)
[facility] is returning call to inform us the procedure will be surgical.

## 2023-01-30 NOTE — Telephone Encounter (Signed)
Pt has been scheduled to see Dr. Odis Hollingshead, today, 01/30/23 2:30.  Clearance will be addressed at that time.  Will route to requesting surgeon's office to make them aware.

## 2023-01-30 NOTE — Telephone Encounter (Signed)
Duplicate phone encounter.  Original note for clearance has been updated.

## 2023-01-30 NOTE — Telephone Encounter (Signed)
Dentist office called back to confirm pt's procedure is surgical.

## 2023-01-30 NOTE — Patient Instructions (Addendum)
Medication Instructions:  Your physician has recommended you make the following change in your medication:   START Fenofibrate 145 mg once daily    *If you need a refill on your cardiac medications before your next appointment, please call your pharmacy*  Lab Work: To be completed in 6 weeks: FASTING lipid panel, direct LDL, and CMP  If you have labs (blood work) drawn today and your tests are completely normal, you will receive your results only by: MyChart Message (if you have MyChart) OR A paper copy in the mail If you have any lab test that is abnormal or we need to change your treatment, we will call you to review the results.  Testing/Procedures: None ordered today.  Follow-Up: At [facility], you and your health needs are our priority.  As part of our continuing mission to provide you with exceptional heart care, we have created designated Provider Care Teams.  These Care Teams include your primary Cardiologist (physician) and Advanced Practice Providers (APPs -  Physician Assistants and Nurse Practitioners) who all work together to provide you with the care you need, when you need it.  We recommend signing up for the patient portal called "MyChart".  Sign up information is provided on this After Visit Summary.  MyChart is used to connect with patients for Virtual Visits (Telemedicine).  Patients are able to view lab/test results, encounter notes, upcoming appointments, etc.  Non-urgent messages can be sent to your provider as well.   To learn more about what you can do with MyChart, go to [email or web address].    Your next appointment:   6 month(s)  The format for your next appointment:   In Person  Provider:   Tessa Lerner, DO {

## 2023-01-31 ENCOUNTER — Telehealth: Payer: Self-pay | Admitting: Cardiology

## 2023-01-31 NOTE — Telephone Encounter (Signed)
Michael Li from [facility] is requesting a callback regarding them not getting the letter or clearance from pt's appt yesterday. She stated she was told they'd receive it. Pt is scheduled to come into their office tomorrow morning. Please advise.

## 2023-01-31 NOTE — Telephone Encounter (Signed)
Called both numbers and they both just ring no voicemail.  I google searched [facility] and what came up is a dentis office with the number [phone]. I left voicemail on this number.

## 2023-01-31 NOTE — Telephone Encounter (Signed)
Patient with diagnosis of A Fib on Xarelto for anticoagulation.    Procedure: FULL ARCH EXTRACTIONS WITH 4 DENTAL IMPLANTS; WILL NEED TO CONFIRM IF SURGICAL OR SIMPLE EXTRACTIONS  Date of procedure: TBD   CHA2DS2-VASc Score = 4  This indicates a 4.8% annual risk of stroke. The patient's score is based upon: CHF History: 0 HTN History: 1 Diabetes History: 1 Stroke History: 0 Vascular Disease History: 1 Age Score: 1 Gender Score: 0   CrCl 81 mL/min Platelet count 166K  Patient does not require pre-op antibiotics for dental procedure.  Per office protocol, patient can hold Xarelto for 2 days prior to procedure.    **This guidance is not considered finalized until pre-operative APP has relayed final recommendations.**

## 2023-02-01 NOTE — Telephone Encounter (Signed)
Michael Li from [facility] is returning nurse call and requesting a callback at [phone]. Please advise.

## 2023-02-01 NOTE — Telephone Encounter (Signed)
Dental office called this morning about clearance. I did confirm with pre op APP Bernadene Person, NP who did confirm notes from Dr. Odis Hollingshead has cleared the pt.   I will fax notes to dental office. We have given verbal ok while dds on was the phone with our office.

## 2023-02-01 NOTE — Telephone Encounter (Signed)
Dental office called back said they still had not received the clearance notes. I confirmed fax # and assured their office that I will manually old school fax .

## 2023-02-01 NOTE — Telephone Encounter (Signed)
[facility]  called in and wanted to know when does the patient need to start back in his Blood thinners   Best number [phone]

## 2023-02-01 NOTE — Telephone Encounter (Signed)
I will fax notes from Edd Fabian, FNP.

## 2023-02-01 NOTE — Telephone Encounter (Signed)
Spoke with Archie Patten from [facility] [employer] and she stated their office had not received the clearance form for this pt for his upcoming dental procedure and the pt was currently in their office. Forwarded this message to Danielle Rankin, CMA in pre-op.

## 2023-02-01 NOTE — Telephone Encounter (Signed)
Restart blood thinner is generally decided by the provider performing the procedure. I will confirm this with the pre op app.

## 2023-02-01 NOTE — Telephone Encounter (Signed)
Preoperative team, the patient's dual antiplatelet therapy or anticoagulation are resumed by person providing/doing surgical procedure.  The reason for this is that they know the extent of the procedure and likely can gauge outcomes much better than we can because we are not present during the patients surgery.  We cannot provide recommendations on when to resume dual antiplatelet therapy or anticoagulation.  Usually surgeons are able to resume agents the next day or day after.  Thank you.  Thomasene Ripple. Mischele Detter NP-C     02/01/2023, 10:56 AM [facility] [address] Office [phone] Fax [phone]

## 2023-02-14 ENCOUNTER — Other Ambulatory Visit (HOSPITAL_COMMUNITY): Payer: Self-pay

## 2023-03-27 ENCOUNTER — Encounter: Payer: Self-pay | Admitting: Internal Medicine

## 2023-03-27 ENCOUNTER — Telehealth: Payer: Self-pay | Admitting: Cardiology

## 2023-03-27 NOTE — Telephone Encounter (Signed)
*  STAT* If patient is at the pharmacy, call can be transferred to refill team.   1. Which medications need to be refilled? (please list name of each medication and dose if known) fenofibrate  (TRICOR ) 145 MG tablet   2. Which pharmacy/location (including street and city if local pharmacy) is medication to be sent to?  3. Do they need a 30 day or 90 day supply? Pt requesting samples due to cost since insurance has not kicked in yet

## 2023-03-27 NOTE — Telephone Encounter (Signed)
 Spoke with patient and he is aware we do not carry samples of fenofibrate 

## 2023-03-30 ENCOUNTER — Other Ambulatory Visit: Payer: Self-pay | Admitting: Internal Medicine

## 2023-03-30 DIAGNOSIS — F900 Attention-deficit hyperactivity disorder, predominantly inattentive type: Secondary | ICD-10-CM

## 2023-03-30 MED ORDER — LISDEXAMFETAMINE DIMESYLATE 30 MG PO CAPS: 30.0000 mg | ORAL_CAPSULE | Freq: Every day | ORAL | 0 refills | Status: DC

## 2023-04-13 ENCOUNTER — Other Ambulatory Visit: Payer: Self-pay | Admitting: Internal Medicine

## 2023-04-13 DIAGNOSIS — E118 Type 2 diabetes mellitus with unspecified complications: Secondary | ICD-10-CM

## 2023-04-24 ENCOUNTER — Other Ambulatory Visit: Payer: Self-pay | Admitting: Internal Medicine

## 2023-04-24 DIAGNOSIS — K219 Gastro-esophageal reflux disease without esophagitis: Secondary | ICD-10-CM

## 2023-04-27 ENCOUNTER — Ambulatory Visit: Payer: Self-pay | Admitting: Internal Medicine

## 2023-05-03 ENCOUNTER — Other Ambulatory Visit: Payer: Self-pay | Admitting: Internal Medicine

## 2023-05-03 DIAGNOSIS — E119 Type 2 diabetes mellitus without complications: Secondary | ICD-10-CM

## 2023-05-03 DIAGNOSIS — F900 Attention-deficit hyperactivity disorder, predominantly inattentive type: Secondary | ICD-10-CM

## 2023-05-03 DIAGNOSIS — I10 Essential (primary) hypertension: Secondary | ICD-10-CM

## 2023-05-03 DIAGNOSIS — I480 Paroxysmal atrial fibrillation: Secondary | ICD-10-CM

## 2023-05-03 MED ORDER — TOUJEO SOLOSTAR 300 UNIT/ML ~~LOC~~ SOPN: 22.0000 [IU] | PEN_INJECTOR | Freq: Every day | SUBCUTANEOUS | 0 refills | Status: DC

## 2023-05-03 MED ORDER — LISDEXAMFETAMINE DIMESYLATE 30 MG PO CAPS: 30.0000 mg | ORAL_CAPSULE | Freq: Every day | ORAL | 0 refills | Status: DC

## 2023-05-03 MED ORDER — METFORMIN HCL ER 750 MG PO TB24: 1500.0000 mg | ORAL_TABLET | Freq: Every day | ORAL | 0 refills | Status: DC

## 2023-05-03 MED ORDER — METOPROLOL SUCCINATE ER 25 MG PO TB24: 25.0000 mg | ORAL_TABLET | Freq: Every day | ORAL | 0 refills | Status: DC

## 2023-05-25 ENCOUNTER — Ambulatory Visit: Admitting: Internal Medicine

## 2023-05-25 ENCOUNTER — Encounter: Payer: Self-pay | Admitting: Internal Medicine

## 2023-05-25 VITALS — BP 134/76 | HR 90 | Temp 98.7°F | Resp 16 | Ht 68.0 in | Wt 152.4 lb

## 2023-05-25 DIAGNOSIS — I2510 Atherosclerotic heart disease of native coronary artery without angina pectoris: Secondary | ICD-10-CM

## 2023-05-25 DIAGNOSIS — E119 Type 2 diabetes mellitus without complications: Secondary | ICD-10-CM

## 2023-05-25 DIAGNOSIS — F900 Attention-deficit hyperactivity disorder, predominantly inattentive type: Secondary | ICD-10-CM

## 2023-05-25 DIAGNOSIS — I10 Essential (primary) hypertension: Secondary | ICD-10-CM | POA: Diagnosis not present

## 2023-05-25 DIAGNOSIS — E785 Hyperlipidemia, unspecified: Secondary | ICD-10-CM | POA: Diagnosis not present

## 2023-05-25 DIAGNOSIS — Z794 Long term (current) use of insulin: Secondary | ICD-10-CM | POA: Diagnosis not present

## 2023-05-25 DIAGNOSIS — I480 Paroxysmal atrial fibrillation: Secondary | ICD-10-CM

## 2023-05-25 DIAGNOSIS — R931 Abnormal findings on diagnostic imaging of heart and coronary circulation: Secondary | ICD-10-CM

## 2023-05-25 DIAGNOSIS — R3912 Poor urinary stream: Secondary | ICD-10-CM

## 2023-05-25 DIAGNOSIS — N401 Enlarged prostate with lower urinary tract symptoms: Secondary | ICD-10-CM

## 2023-05-25 DIAGNOSIS — E118 Type 2 diabetes mellitus with unspecified complications: Secondary | ICD-10-CM

## 2023-05-25 DIAGNOSIS — N5201 Erectile dysfunction due to arterial insufficiency: Secondary | ICD-10-CM

## 2023-05-25 DIAGNOSIS — K219 Gastro-esophageal reflux disease without esophagitis: Secondary | ICD-10-CM

## 2023-05-25 DIAGNOSIS — I2584 Coronary atherosclerosis due to calcified coronary lesion: Secondary | ICD-10-CM

## 2023-05-25 LAB — CBC WITH DIFFERENTIAL/PLATELET
Basophils Absolute: 0 10*3/uL (ref 0.0–0.1)
Basophils Relative: 0.2 % (ref 0.0–3.0)
Eosinophils Absolute: 0 10*3/uL (ref 0.0–0.7)
Eosinophils Relative: 1.1 % (ref 0.0–5.0)
HCT: 44.7 % (ref 39.0–52.0)
Hemoglobin: 15.3 g/dL (ref 13.0–17.0)
Lymphocytes Relative: 17.7 % (ref 12.0–46.0)
Lymphs Abs: 0.7 10*3/uL (ref 0.7–4.0)
MCHC: 34.3 g/dL (ref 30.0–36.0)
MCV: 87.4 fl (ref 78.0–100.0)
Monocytes Absolute: 0.4 10*3/uL (ref 0.1–1.0)
Monocytes Relative: 10.8 % (ref 3.0–12.0)
Neutro Abs: 2.9 10*3/uL (ref 1.4–7.7)
Neutrophils Relative %: 70.2 % (ref 43.0–77.0)
Platelets: 177 10*3/uL (ref 150.0–400.0)
RBC: 5.11 Mil/uL (ref 4.22–5.81)
RDW: 13.9 % (ref 11.5–15.5)
WBC: 4.1 10*3/uL (ref 4.0–10.5)

## 2023-05-25 LAB — URINALYSIS, ROUTINE W REFLEX MICROSCOPIC
Bilirubin Urine: NEGATIVE
Hgb urine dipstick: NEGATIVE
Ketones, ur: NEGATIVE
Leukocytes,Ua: NEGATIVE
Nitrite: NEGATIVE
RBC / HPF: NONE SEEN (ref 0–?)
Specific Gravity, Urine: 1.01 (ref 1.000–1.030)
Total Protein, Urine: NEGATIVE
Urine Glucose: >=1000 — AB
Urobilinogen, UA: 0.2 (ref 0.0–1.0)
WBC, UA: NONE SEEN (ref 0–?)
pH: 6 (ref 5.0–8.0)

## 2023-05-25 LAB — BASIC METABOLIC PANEL WITH GFR
BUN: 21 mg/dL (ref 6–23)
CO2: 29 meq/L (ref 19–32)
Calcium: 10 mg/dL (ref 8.4–10.5)
Chloride: 102 meq/L (ref 96–112)
Creatinine, Ser: 1 mg/dL (ref 0.40–1.50)
GFR: 77.55 mL/min (ref >60.00)
Glucose, Bld: 192 mg/dL — ABNORMAL HIGH (ref 70–99)
Potassium: 5 meq/L (ref 3.5–5.1)
Sodium: 140 meq/L (ref 135–145)

## 2023-05-25 LAB — LIPID PANEL
Cholesterol: 143 mg/dL (ref 0–200)
HDL: 51.6 mg/dL (ref >39.00)
LDL Cholesterol: 72 mg/dL (ref 0–99)
NonHDL: 91.23
Total CHOL/HDL Ratio: 3
Triglycerides: 98 mg/dL (ref 0.0–149.0)
VLDL: 19.6 mg/dL (ref 0.0–40.0)

## 2023-05-25 LAB — MICROALBUMIN / CREATININE URINE RATIO
Creatinine,U: 63.9 mg/dL
Microalb Creat Ratio: UNDETERMINED mg/g (ref 0.0–30.0)
Microalb, Ur: <0.7 mg/dL

## 2023-05-25 LAB — HEPATIC FUNCTION PANEL
ALT: 20 U/L (ref 0–53)
AST: 24 U/L (ref 0–37)
Albumin: 4.9 g/dL (ref 3.5–5.2)
Alkaline Phosphatase: 53 U/L (ref 39–117)
Bilirubin, Direct: 0.2 mg/dL (ref 0.0–0.3)
Total Bilirubin: 0.7 mg/dL (ref 0.2–1.2)
Total Protein: 7 g/dL (ref 6.0–8.3)

## 2023-05-25 LAB — CK: Total CK: 63 U/L (ref 7–232)

## 2023-05-25 LAB — PSA: PSA: 1.75 ng/mL (ref 0.10–4.00)

## 2023-05-25 LAB — HEMOGLOBIN A1C: Hgb A1c MFr Bld: 7.3 % — ABNORMAL HIGH (ref 4.6–6.5)

## 2023-05-25 MED ORDER — ALFUZOSIN HCL ER 10 MG PO TB24: 10.0000 mg | ORAL_TABLET | Freq: Every day | ORAL | 1 refills | Status: DC

## 2023-05-25 MED ORDER — FARXIGA 10 MG PO TABS: 10.0000 mg | ORAL_TABLET | Freq: Every day | ORAL | 0 refills | Status: DC

## 2023-05-25 MED ORDER — TOUJEO SOLOSTAR 300 UNIT/ML ~~LOC~~ SOPN: 22.0000 [IU] | PEN_INJECTOR | Freq: Every day | SUBCUTANEOUS | 0 refills | Status: DC

## 2023-05-25 MED ORDER — RIVAROXABAN 20 MG PO TABS: ORAL_TABLET | ORAL | 1 refills | Status: AC

## 2023-05-25 MED ORDER — TADALAFIL 20 MG PO TABS: ORAL_TABLET | ORAL | 5 refills | Status: AC

## 2023-05-25 MED ORDER — METFORMIN HCL ER 750 MG PO TB24: 1500.0000 mg | ORAL_TABLET | Freq: Every day | ORAL | 1 refills | Status: DC

## 2023-05-25 MED ORDER — METOPROLOL SUCCINATE ER 25 MG PO TB24: 25.0000 mg | ORAL_TABLET | Freq: Every day | ORAL | 1 refills | Status: AC

## 2023-05-25 MED ORDER — LISDEXAMFETAMINE DIMESYLATE 30 MG PO CAPS: 30.0000 mg | ORAL_CAPSULE | Freq: Every day | ORAL | 0 refills | Status: DC

## 2023-05-25 MED ORDER — FENOFIBRATE 145 MG PO TABS
145.0000 mg | ORAL_TABLET | Freq: Every day | ORAL | 1 refills | Status: DC
Start: 2023-05-25 — End: 2023-05-29

## 2023-05-25 MED ORDER — ATORVASTATIN CALCIUM 10 MG PO TABS: 10.0000 mg | ORAL_TABLET | Freq: Every day | ORAL | 1 refills | Status: DC

## 2023-05-25 MED ORDER — BD PEN NEEDLE MICRO U/F 32G X 6 MM MISC
1.0000 | Freq: Every day | 3 refills | Status: AC
Start: 2023-05-25 — End: ?

## 2023-05-25 MED ORDER — OMEPRAZOLE 20 MG PO CPDR: 20.0000 mg | DELAYED_RELEASE_CAPSULE | Freq: Every day | ORAL | 1 refills | Status: DC

## 2023-05-25 NOTE — Patient Instructions (Signed)

## 2023-05-25 NOTE — Progress Notes (Unsigned)
 Subjective:  Patient ID: Michael Li, male    DOB: 1955/11/16  Age: 68 y.o. MRN: [id number]  CC: Hypertension, Atrial Fibrillation, Hyperlipidemia, and Diabetes   HPI Michael Li presents for f/up -----  Discussed the use of AI scribe software for clinical note transcription with the patient, who gave verbal consent to proceed.  History of Present Illness   Michael Li "Michael Li" is a 68 year old male who presents for f/up.  He does not usually feel his heart beating rapidly but acknowledges it feels 'a little rapid' during conversations. No chest pain, shortness of breath, dizziness, or lightheadedness are present.  His medication regimen includes Xarelto, which he finds expensive in the [place] but more affordable through [place] sources. He is interested in obtaining generic medications where possible and is adjusting his lifestyle due to financial constraints.  He notes it takes him a long time to start urinating.       Outpatient Medications Prior to Visit  Medication Sig Dispense Refill   Continuous Glucose Receiver (FREESTYLE LIBRE 14 DAY READER) DEVI USE DAILY AS DIRECTED 2 each 5   Continuous Glucose Sensor (FREESTYLE LIBRE 14 DAY SENSOR) MISC USE AND CHANGE EVERY 14 DAYS AS DIRECTED 2 each 5   alfuzosin (UROXATRAL) 10 MG 24 hr tablet Take 1 by mouth daily with breakfast 90 tablet 0   atorvastatin (LIPITOR) 10 MG tablet TAKE 1 TABLET(10 MG) BY MOUTH DAILY 90 tablet 0   BD PEN NEEDLE MICRO U/F 32G X 6 MM MISC USE 2 PEN NEEDLES DAILY 200 each 3   FARXIGA 10 MG TABS tablet Take 1 tablet (10 mg total) by mouth daily. 90 tablet 0   fenofibrate (TRICOR) 145 MG tablet Take 1 tablet (145 mg total) by mouth daily. 30 tablet 3   insulin glargine, 1 Unit Dial, (TOUJEO SOLOSTAR) 300 UNIT/ML Solostar Pen Inject 22 Units into the skin daily. 7.5 mL 0   lisdexamfetamine (VYVANSE) 30 MG capsule Take 1 capsule (30 mg total) by mouth daily. 30 capsule 0   metFORMIN  (GLUCOPHAGE-XR) 750 MG 24 hr tablet Take 2 tablets (1,500 mg total) by mouth daily with breakfast. 180 tablet 0   metoprolol succinate (TOPROL-XL) 25 MG 24 hr tablet Take 1 tablet (25 mg total) by mouth daily. 90 tablet 0   omeprazole (PRILOSEC) 20 MG capsule TAKE 1 CAPSULE DAILY 90 capsule 3   rivaroxaban (XARELTO) 20 MG TABS tablet TAKE 1 TABLET(20 MG) BY MOUTH DAILY WITH SUPPER 90 tablet 0   tadalafil (CIALIS) 20 MG tablet TAKE 1 TABLET BY MOUTH DAILY AS NEEDED FOR E.D. 10 tablet 5   albuterol (VENTOLIN HFA) 108 (90 Base) MCG/ACT inhaler Inhale 2 puffs into the lungs every 6 (six) hours as needed for wheezing or shortness of breath. 8 g 6   No facility-administered medications prior to visit.    ROS Review of Systems  Genitourinary:  Positive for difficulty urinating.  Hematological:  Negative for adenopathy. Does not bruise/bleed easily.  Psychiatric/Behavioral: Negative.      Objective:  BP 134/76 (BP Location: Left Arm, Patient Position: Sitting, Cuff Size: Normal)   Pulse 90   Temp 98.7 F (37.1 C) (Oral)   Resp 16   Ht 5\' 8"  (1.727 m)   Wt 152 lb 6.4 oz (69.1 kg)   SpO2 97%   BMI 23.17 kg/m   BP Readings from Last 3 Encounters:  05/25/23 134/76  01/30/23 120/70  12/21/22 130/60    Wt Readings  from Last 3 Encounters:  05/25/23 152 lb 6.4 oz (69.1 kg)  01/30/23 157 lb 3.2 oz (71.3 kg)  12/21/22 164 lb (74.4 kg)    Physical Exam Vitals reviewed.  Constitutional:      Appearance: Normal appearance.  HENT:     Nose: Nose normal.     Mouth/Throat:     Mouth: Mucous membranes are moist.  Eyes:     General: No scleral icterus.    Conjunctiva/sclera: Conjunctivae normal.  Cardiovascular:     Rate and Rhythm: Normal rate and regular rhythm.     Heart sounds: No murmur heard.    No friction rub. No gallop.     Comments: EKG--- NSR, 87 bpm No LVH, Q waves, or ST/T wave changes  Unchanged  Pulmonary:     Effort: Pulmonary effort is normal.     Breath sounds:  No stridor. No wheezing, rhonchi or rales.  Abdominal:     General: Abdomen is flat.     Palpations: There is no mass.     Tenderness: There is no abdominal tenderness. There is no guarding.     Hernia: No hernia is present. There is no hernia in the left inguinal area or right inguinal area.  Genitourinary:    Pubic Area: No rash.      Penis: Normal and circumcised.      Testes: Normal.        Right: Mass not present.        Left: Mass not present.     Epididymis:     Right: Normal.     Left: Normal.     Prostate: Enlarged. Not tender and no nodules present.     Rectum: Normal. Guaiac result negative. No mass, tenderness, anal fissure, external hemorrhoid or internal hemorrhoid. Normal anal tone.  Musculoskeletal:        General: Normal range of motion.     Cervical back: Neck supple.     Right lower leg: No edema.     Left lower leg: No edema.  Lymphadenopathy:     Cervical: No cervical adenopathy.     Lower Body: No right inguinal adenopathy. No left inguinal adenopathy.  Skin:    General: Skin is warm and dry.     Findings: No rash.  Neurological:     General: No focal deficit present.     Mental Status: He is alert. Mental status is at baseline.  Psychiatric:        Mood and Affect: Mood normal.        Behavior: Behavior normal.     Lab Results  Component Value Date   WBC 4.9 11/22/2022   HGB 14.4 11/22/2022   HCT 42.3 11/22/2022   PLT 166.0 11/22/2022   GLUCOSE 153 (H) 11/22/2022   CHOL 110 05/09/2022   TRIG 212.0 (H) 05/09/2022   HDL 38.60 (L) 05/09/2022   LDLDIRECT 58.0 05/09/2022   LDLCALC 52 03/18/2021   ALT 35 11/22/2022   AST 33 11/22/2022   NA 140 11/22/2022   K 5.1 11/22/2022   CL 104 11/22/2022   CREATININE 0.89 11/22/2022   BUN 17 11/22/2022   CO2 28 11/22/2022   TSH 1.07 11/22/2022   PSA 2.03 05/09/2022   INR 1.0 12/08/2020   HGBA1C 6.8 (H) 11/22/2022   MICROALBUR <0.7 05/09/2022    CT Chest Wo Contrast Result Date:  02/07/2023 CLINICAL DATA:  Lung nodule, 6-29mm lung nodule EXAM: CT CHEST WITHOUT CONTRAST TECHNIQUE: Multidetector CT imaging of the  chest was performed following the standard protocol without IV contrast. RADIATION DOSE REDUCTION: This exam was performed according to the departmental dose-optimization program which includes automated exposure control, adjustment of the mA and/or kV according to patient size and/or use of iterative reconstruction technique. COMPARISON:  CT scan chest from 02/10/2022. FINDINGS: Cardiovascular: Normal cardiac size. No pericardial effusion. No aortic aneurysm. There are coronary artery calcifications, in keeping with coronary artery disease. There are also mild-to-moderate peripheral atherosclerotic vascular calcifications of thoracic aorta and its major branches. Mediastinum/Nodes: Visualized thyroid gland appears grossly unremarkable. No solid / cystic mediastinal masses. The esophagus is nondistended precluding optimal assessment. There is mild circumferential thickening of the lower thoracic esophagus, which is most likely seen in the settings of chronic gastroesophageal reflux disease versus esophagitis. No mediastinal or axillary lymphadenopathy by size criteria. Evaluation of bilateral hila is limited due to lack on intravenous contrast: however, no large hilar lymphadenopathy identified. Lungs/Pleura: The central tracheo-bronchial tree is patent. Mild-to-moderate upper lobe predominant emphysematous changes noted. There are patchy areas of linear, plate-like atelectasis and/or scarring throughout bilateral lungs. No mass or consolidation. No pleural effusion or pneumothorax. No new or suspicious lung nodules. Redemonstration of focal scarring in the left lung apex (series four, image thirty two), which is essentially similar to the prior study. Upper Abdomen: Small volume layering calculi/sludge noted in the gallbladder without imaging signs of acute cholecystitis. Remaining  visualized upper abdominal viscera within normal limits. Musculoskeletal: The visualized soft tissues of the chest wall are grossly unremarkable. No suspicious osseous lesions. There are mild multilevel degenerative changes in the visualized spine. Subacute/healing left fourth, fifth, sixth, tenth and eleventh rib fractures noted. IMPRESSION: *Redemonstration of focal scarring in the left lung apex, which is essentially similar to the prior study. No new or suspicious lung nodule. *Multiple other nonacute observations, as described above. Aortic Atherosclerosis (ICD10-I70.0) and Emphysema (ICD10-J43.9). Electronically Signed   By: Jules Schick M.D.   On: 02/07/2023 20:32    Assessment & Plan:  Essential hypertension -     Urinalysis, Routine w reflex microscopic; Future -     Basic metabolic panel with GFR; Future -     CBC with Differential/Platelet; Future -     Metoprolol Succinate ER; Take 1 tablet (25 mg total) by mouth daily.  Dispense: 90 tablet; Refill: 1  Insulin-requiring or dependent type II diabetes mellitus (HCC) -     Microalbumin / creatinine urine ratio; Future -     Urinalysis, Routine w reflex microscopic; Future -     Hemoglobin A1c; Future -     HM Diabetes Foot Exam -     Farxiga; Take 1 tablet (10 mg total) by mouth daily.  Dispense: 90 tablet; Refill: 0 -     Toujeo SoloStar; Inject 22 Units into the skin daily.  Dispense: 7.5 mL; Refill: 0 -     metFORMIN HCl ER; Take 2 tablets (1,500 mg total) by mouth daily with breakfast.  Dispense: 180 tablet; Refill: 1  Benign prostatic hyperplasia with weak urinary stream -     Urinalysis, Routine w reflex microscopic; Future -     PSA; Future -     Alfuzosin HCl ER; Take 1 tablet (10 mg total) by mouth daily with breakfast. Take 1 by mouth daily with breakfast  Dispense: 90 tablet; Refill: 1  Hyperlipidemia LDL goal <100 -     Lipid panel; Future -     Hepatic function panel; Future -     Atorvastatin Calcium;  Take 1 tablet  (10 mg total) by mouth daily. TAKE 1 TABLET(10 MG) BY MOUTH DAILY  Dispense: 90 tablet; Refill: 1 -     CK; Future  PAF (paroxysmal atrial fibrillation) (HCC) -     EKG 12-Lead -     Metoprolol Succinate ER; Take 1 tablet (25 mg total) by mouth daily.  Dispense: 90 tablet; Refill: 1 -     Rivaroxaban; TAKE 1 TABLET(20 MG) BY MOUTH DAILY WITH SUPPER  Dispense: 90 tablet; Refill: 1  Type II diabetes mellitus with manifestations (HCC) -     BD Pen Needle Micro U/F; Inject 1 Act into the skin daily.  Dispense: 200 each; Refill: 3  Coronary atherosclerosis due to calcified coronary lesion -     Fenofibrate; Take 1 tablet (145 mg total) by mouth daily.  Dispense: 90 tablet; Refill: 1  Agatston CAC score, >400 -     Fenofibrate; Take 1 tablet (145 mg total) by mouth daily.  Dispense: 90 tablet; Refill: 1  Attention deficit hyperactivity disorder (ADHD), predominantly inattentive type -     Lisdexamfetamine Dimesylate; Take 1 capsule (30 mg total) by mouth daily.  Dispense: 30 capsule; Refill: 0  Gastroesophageal reflux disease without esophagitis -     Omeprazole; Take 1 capsule (20 mg total) by mouth daily.  Dispense: 90 capsule; Refill: 1  Erectile dysfunction due to arterial insufficiency -     Tadalafil; TAKE 1 TABLET BY MOUTH DAILY AS NEEDED FOR E.D.  Dispense: 10 tablet; Refill: 5     Follow-up: Return in about 6 months (around 11/24/2023).  Sanda Linger, MD

## 2023-05-26 ENCOUNTER — Other Ambulatory Visit: Payer: Self-pay | Admitting: Cardiology

## 2023-05-26 DIAGNOSIS — I2510 Atherosclerotic heart disease of native coronary artery without angina pectoris: Secondary | ICD-10-CM

## 2023-05-26 DIAGNOSIS — R931 Abnormal findings on diagnostic imaging of heart and coronary circulation: Secondary | ICD-10-CM

## 2023-06-05 ENCOUNTER — Ambulatory Visit: Admitting: Internal Medicine

## 2023-06-26 ENCOUNTER — Encounter: Payer: Self-pay | Admitting: Internal Medicine

## 2023-06-27 ENCOUNTER — Other Ambulatory Visit: Payer: Self-pay

## 2023-06-27 DIAGNOSIS — F900 Attention-deficit hyperactivity disorder, predominantly inattentive type: Secondary | ICD-10-CM

## 2023-06-27 MED ORDER — LISDEXAMFETAMINE DIMESYLATE 30 MG PO CAPS: 30.0000 mg | ORAL_CAPSULE | Freq: Every day | ORAL | 0 refills | Status: DC

## 2023-07-28 ENCOUNTER — Other Ambulatory Visit: Payer: Self-pay

## 2023-07-28 ENCOUNTER — Encounter: Payer: Self-pay | Admitting: Internal Medicine

## 2023-07-28 DIAGNOSIS — F900 Attention-deficit hyperactivity disorder, predominantly inattentive type: Secondary | ICD-10-CM

## 2023-07-28 MED ORDER — LISDEXAMFETAMINE DIMESYLATE 30 MG PO CAPS: 30.0000 mg | ORAL_CAPSULE | Freq: Every day | ORAL | 0 refills | Status: DC

## 2023-08-01 ENCOUNTER — Other Ambulatory Visit: Payer: Self-pay | Admitting: Internal Medicine

## 2023-08-01 DIAGNOSIS — F900 Attention-deficit hyperactivity disorder, predominantly inattentive type: Secondary | ICD-10-CM

## 2023-08-01 NOTE — Telephone Encounter (Signed)
 Pt wants 30 day supply instead

## 2023-08-01 NOTE — Telephone Encounter (Signed)
 Copied from CRM [id number]. Topic: Clinical - Medication Refill >> Aug 01, 2023  9:57 AM Jim Motts C wrote: Medication: lisdexamfetamine (VYVANSE ) 30 MG capsule. Patient stated an error was made. His script should be 30 day supply, not 90 day supply.   Has the patient contacted their pharmacy? Yes. Patient needs a 30 day supply, not 90 day supply.  (Agent: If no, request that the patient contact the pharmacy for the refill. If patient does not wish to contact the pharmacy document the reason why and proceed with request.) (Agent: If yes, when and what did the pharmacy advise?)  This is the patient's preferred pharmacy:  [id number] - [place], [place] - [address] [facility] [address] [place] [address] Phone: [phone] Fax: [phone]   Is this the correct pharmacy for this prescription? Yes If no, delete pharmacy and type the correct one.   Has the prescription been filled recently? Yes  Is the patient out of the medication? Yes  Has the patient been seen for an appointment in the last year OR does the patient have an upcoming appointment? Yes  Can we respond through MyChart? Yes  Agent: Please be advised that Rx refills may take up to 3 business days. We ask that you follow-up with your pharmacy.

## 2023-08-01 NOTE — Telephone Encounter (Signed)
 See message from pt.    Mistake made.   It should be a 30 day supply of the Vyvanse  30 mg not a 90 day.

## 2023-08-02 MED ORDER — LISDEXAMFETAMINE DIMESYLATE 30 MG PO CAPS: 30.0000 mg | ORAL_CAPSULE | Freq: Every day | ORAL | 0 refills | Status: DC

## 2023-08-05 DIAGNOSIS — Z87891 Personal history of nicotine dependence: Secondary | ICD-10-CM | POA: Diagnosis not present

## 2023-08-05 DIAGNOSIS — Z833 Family history of diabetes mellitus: Secondary | ICD-10-CM | POA: Diagnosis not present

## 2023-08-05 DIAGNOSIS — I4891 Unspecified atrial fibrillation: Secondary | ICD-10-CM | POA: Diagnosis not present

## 2023-08-05 DIAGNOSIS — Z8249 Family history of ischemic heart disease and other diseases of the circulatory system: Secondary | ICD-10-CM | POA: Diagnosis not present

## 2023-08-05 DIAGNOSIS — R32 Unspecified urinary incontinence: Secondary | ICD-10-CM | POA: Diagnosis not present

## 2023-08-05 DIAGNOSIS — I10 Essential (primary) hypertension: Secondary | ICD-10-CM | POA: Diagnosis not present

## 2023-08-05 DIAGNOSIS — F325 Major depressive disorder, single episode, in full remission: Secondary | ICD-10-CM | POA: Diagnosis not present

## 2023-08-05 DIAGNOSIS — E119 Type 2 diabetes mellitus without complications: Secondary | ICD-10-CM | POA: Diagnosis not present

## 2023-08-05 DIAGNOSIS — E785 Hyperlipidemia, unspecified: Secondary | ICD-10-CM | POA: Diagnosis not present

## 2023-08-05 DIAGNOSIS — D6869 Other thrombophilia: Secondary | ICD-10-CM | POA: Diagnosis not present

## 2023-08-05 DIAGNOSIS — N529 Male erectile dysfunction, unspecified: Secondary | ICD-10-CM | POA: Diagnosis not present

## 2023-08-05 DIAGNOSIS — Z7984 Long term (current) use of oral hypoglycemic drugs: Secondary | ICD-10-CM | POA: Diagnosis not present

## 2023-08-29 ENCOUNTER — Encounter: Payer: Self-pay | Admitting: Internal Medicine

## 2023-08-31 ENCOUNTER — Telehealth: Payer: Self-pay | Admitting: Internal Medicine

## 2023-08-31 DIAGNOSIS — F900 Attention-deficit hyperactivity disorder, predominantly inattentive type: Secondary | ICD-10-CM

## 2023-08-31 MED ORDER — LISDEXAMFETAMINE DIMESYLATE 30 MG PO CAPS: 30.0000 mg | ORAL_CAPSULE | Freq: Every day | ORAL | 0 refills | Status: DC

## 2023-08-31 NOTE — Telephone Encounter (Signed)
 Copied from CRM [id number]. Topic: Clinical - Medication Question >> Aug 31, 2023  1:44 PM Mesmerise C wrote: Reason for CRM: Patient stated that [facility] gives him samples of Farxiga  due to it being expensive at the pharmacy and inquiring if she can get him another sample since he's completely out

## 2023-08-31 NOTE — Telephone Encounter (Signed)
 Copied from CRM [id number]. Topic: Clinical - Medication Refill >> Aug 31, 2023  1:42 PM Mesmerise C wrote: Medication:  lisdexamfetamine (VYVANSE ) 30 MG capsule   Has the patient contacted their pharmacy? No (Agent: If no, request that the patient contact the pharmacy for the refill. If patient does not wish to contact the pharmacy document the reason why and proceed with request.) (Agent: If yes, when and what did the pharmacy advise?)  This is the patient's preferred pharmacy:  [id number] - [place], [place] - [address] AT [facility] [address] [place] [address] Phone: [phone] Fax: [phone]   Is this the correct pharmacy for this prescription? Yes If no, delete pharmacy and type the correct one.   Has the prescription been filled recently? No  Is the patient out of the medication? No  Has the patient been seen for an appointment in the last year OR does the patient have an upcoming appointment? Yes  Can we respond through MyChart? Yes  Agent: Please be advised that Rx refills may take up to 3 business days. We ask that you follow-up with your pharmacy.

## 2023-09-29 ENCOUNTER — Other Ambulatory Visit: Payer: Self-pay

## 2023-09-29 DIAGNOSIS — F900 Attention-deficit hyperactivity disorder, predominantly inattentive type: Secondary | ICD-10-CM

## 2023-09-29 MED ORDER — LISDEXAMFETAMINE DIMESYLATE 30 MG PO CAPS: 30.0000 mg | ORAL_CAPSULE | Freq: Every day | ORAL | 0 refills | Status: DC

## 2023-10-02 NOTE — Telephone Encounter (Signed)
 As I was getting ready to message the patient he came in there office. He has signed his patient assistance forms they have been placed in christy's box.

## 2023-10-10 ENCOUNTER — Telehealth: Payer: Self-pay | Admitting: Pharmacist

## 2023-10-10 NOTE — Telephone Encounter (Signed)
 Received completed farxiga  PAP application with patient and provider signatures. Faxed to AZ[employer] and MAP line.   PAP [employer]: I have submitted patient and provider portions of [employer] program application for Farxiga  10 mg. Please document, scan application to media tab, and follow up with the company.    Darrelyn Drum, PharmD, BCPS, CPP Clinical Pharmacist Practitioner [facility] [phone]

## 2023-10-30 ENCOUNTER — Telehealth: Payer: Self-pay

## 2023-10-30 ENCOUNTER — Encounter: Payer: Self-pay | Admitting: Internal Medicine

## 2023-10-30 ENCOUNTER — Other Ambulatory Visit: Payer: Self-pay

## 2023-10-30 DIAGNOSIS — F900 Attention-deficit hyperactivity disorder, predominantly inattentive type: Secondary | ICD-10-CM

## 2023-10-30 NOTE — Progress Notes (Signed)
   10/30/2023  Patient ID: Michael Li, male   DOB: 1955-11-03, 68 y.o.   MRN: [id number]  Patient has been denied by the [employer] patient assistance program for Farxiga  due to exceeding their income limitations.  Unfortunately, [employer] has an even stricter income limit and cannot be considered as a possible alternative for their program.  Jon VEAR Lindau, PharmD Clinical Pharmacist [phone]

## 2023-11-01 ENCOUNTER — Ambulatory Visit: Admitting: Internal Medicine

## 2023-11-01 ENCOUNTER — Encounter: Payer: Self-pay | Admitting: Internal Medicine

## 2023-11-01 VITALS — BP 136/68 | HR 57 | Temp 97.9°F | Resp 16 | Ht 68.0 in | Wt 156.2 lb

## 2023-11-01 DIAGNOSIS — I480 Paroxysmal atrial fibrillation: Secondary | ICD-10-CM | POA: Diagnosis not present

## 2023-11-01 DIAGNOSIS — K7460 Unspecified cirrhosis of liver: Secondary | ICD-10-CM

## 2023-11-01 DIAGNOSIS — E119 Type 2 diabetes mellitus without complications: Secondary | ICD-10-CM | POA: Diagnosis not present

## 2023-11-01 DIAGNOSIS — F900 Attention-deficit hyperactivity disorder, predominantly inattentive type: Secondary | ICD-10-CM

## 2023-11-01 DIAGNOSIS — E785 Hyperlipidemia, unspecified: Secondary | ICD-10-CM

## 2023-11-01 DIAGNOSIS — I10 Essential (primary) hypertension: Secondary | ICD-10-CM

## 2023-11-01 DIAGNOSIS — R3912 Poor urinary stream: Secondary | ICD-10-CM | POA: Diagnosis not present

## 2023-11-01 DIAGNOSIS — Z0001 Encounter for general adult medical examination with abnormal findings: Secondary | ICD-10-CM

## 2023-11-01 DIAGNOSIS — N401 Enlarged prostate with lower urinary tract symptoms: Secondary | ICD-10-CM | POA: Diagnosis not present

## 2023-11-01 DIAGNOSIS — Z794 Long term (current) use of insulin: Secondary | ICD-10-CM | POA: Diagnosis not present

## 2023-11-01 DIAGNOSIS — Z23 Encounter for immunization: Secondary | ICD-10-CM

## 2023-11-01 DIAGNOSIS — Z0000 Encounter for general adult medical examination without abnormal findings: Secondary | ICD-10-CM

## 2023-11-01 DIAGNOSIS — K219 Gastro-esophageal reflux disease without esophagitis: Secondary | ICD-10-CM

## 2023-11-01 LAB — URINALYSIS, ROUTINE W REFLEX MICROSCOPIC
Bilirubin Urine: NEGATIVE
Hgb urine dipstick: NEGATIVE
Ketones, ur: NEGATIVE
Leukocytes,Ua: NEGATIVE
Nitrite: NEGATIVE
Specific Gravity, Urine: 1.01 (ref 1.000–1.030)
Total Protein, Urine: NEGATIVE
Urine Glucose: >=1000 — AB
Urobilinogen, UA: 0.2 (ref 0.0–1.0)
WBC, UA: NONE SEEN (ref 0–?)
pH: 5.5 (ref 5.0–8.0)

## 2023-11-01 LAB — CBC WITH DIFFERENTIAL/PLATELET
Basophils Absolute: 0 K/uL (ref 0.0–0.1)
Basophils Relative: 0.2 % (ref 0.0–3.0)
Eosinophils Absolute: 0.1 K/uL (ref 0.0–0.7)
Eosinophils Relative: 1.7 % (ref 0.0–5.0)
HCT: 43.8 % (ref 39.0–52.0)
Hemoglobin: 15 g/dL (ref 13.0–17.0)
Lymphocytes Relative: 24.6 % (ref 12.0–46.0)
Lymphs Abs: 1 K/uL (ref 0.7–4.0)
MCHC: 34.2 g/dL (ref 30.0–36.0)
MCV: 86.1 fl (ref 78.0–100.0)
Monocytes Absolute: 0.5 K/uL (ref 0.1–1.0)
Monocytes Relative: 12.3 % — ABNORMAL HIGH (ref 3.0–12.0)
Neutro Abs: 2.6 K/uL (ref 1.4–7.7)
Neutrophils Relative %: 61.2 % (ref 43.0–77.0)
Platelets: 173 K/uL (ref 150.0–400.0)
RBC: 5.09 Mil/uL (ref 4.22–5.81)
RDW: 13.6 % (ref 11.5–15.5)
WBC: 4.2 K/uL (ref 4.0–10.5)

## 2023-11-01 LAB — BASIC METABOLIC PANEL WITH GFR
BUN: 23 mg/dL (ref 6–23)
CO2: 26 meq/L (ref 19–32)
Calcium: 10.1 mg/dL (ref 8.4–10.5)
Chloride: 104 meq/L (ref 96–112)
Creatinine, Ser: 0.84 mg/dL (ref 0.40–1.50)
GFR: 89.58 mL/min (ref >60.00)
Glucose, Bld: 172 mg/dL — ABNORMAL HIGH (ref 70–99)
Potassium: 4.5 meq/L (ref 3.5–5.1)
Sodium: 140 meq/L (ref 135–145)

## 2023-11-01 LAB — HEPATIC FUNCTION PANEL
ALT: 19 U/L (ref 0–53)
AST: 21 U/L (ref 0–37)
Albumin: 4.7 g/dL (ref 3.5–5.2)
Alkaline Phosphatase: 45 U/L (ref 39–117)
Bilirubin, Direct: 0.2 mg/dL (ref 0.0–0.3)
Total Bilirubin: 0.9 mg/dL (ref 0.2–1.2)
Total Protein: 6.6 g/dL (ref 6.0–8.3)

## 2023-11-01 LAB — HEMOGLOBIN A1C: Hgb A1c MFr Bld: 7.7 % — ABNORMAL HIGH (ref 4.6–6.5)

## 2023-11-01 LAB — PROTIME-INR
INR: 1.8 ratio — ABNORMAL HIGH (ref 0.8–1.0)
Prothrombin Time: 18.8 s — ABNORMAL HIGH (ref 9.6–13.1)

## 2023-11-01 LAB — PSA: PSA: 1.74 ng/mL (ref 0.10–4.00)

## 2023-11-01 LAB — TSH: TSH: 1.63 u[IU]/mL (ref 0.35–5.50)

## 2023-11-01 MED ORDER — FREESTYLE LIBRE 3 PLUS SENSOR MISC: 1.0000 | 1 refills | Status: DC

## 2023-11-01 MED ORDER — FARXIGA 10 MG PO TABS
10.0000 mg | ORAL_TABLET | Freq: Every day | ORAL | 1 refills | Status: DC
Start: 2023-11-01 — End: 2023-11-01

## 2023-11-01 MED ORDER — FARXIGA 10 MG PO TABS
10.0000 mg | ORAL_TABLET | Freq: Every day | ORAL | 0 refills | Status: DC
Start: 2023-11-01 — End: 2023-11-09

## 2023-11-01 MED ORDER — TOUJEO SOLOSTAR 300 UNIT/ML ~~LOC~~ SOPN: 22.0000 [IU] | PEN_INJECTOR | Freq: Every day | SUBCUTANEOUS | 0 refills | Status: DC

## 2023-11-01 MED ORDER — LISDEXAMFETAMINE DIMESYLATE 30 MG PO CAPS
30.0000 mg | ORAL_CAPSULE | Freq: Every day | ORAL | 0 refills | Status: DC
Start: 2023-11-01 — End: 2023-12-01

## 2023-11-01 NOTE — Patient Instructions (Signed)
 Health Maintenance, Male  Adopting a healthy lifestyle and getting preventive care are important in promoting health and wellness. Ask your health care provider about:  The right schedule for you to have regular tests and exams.  Things you can do on your own to prevent diseases and keep yourself healthy.  What should I know about diet, weight, and exercise?  Eat a healthy diet    Eat a diet that includes plenty of vegetables, fruits, low-fat dairy products, and lean protein.  Do not eat a lot of foods that are high in solid fats, added sugars, or sodium.  Maintain a healthy weight  Body mass index (BMI) is a measurement that can be used to identify possible weight problems. It estimates body fat based on height and weight. Your health care provider can help determine your BMI and help you achieve or maintain a healthy weight.  Get regular exercise  Get regular exercise. This is one of the most important things you can do for your health. Most adults should:  Exercise for at least 150 minutes each week. The exercise should increase your heart rate and make you sweat (moderate-intensity exercise).  Do strengthening exercises at least twice a week. This is in addition to the moderate-intensity exercise.  Spend less time sitting. Even light physical activity can be beneficial.  Watch cholesterol and blood lipids  Have your blood tested for lipids and cholesterol at 68 years of age, then have this test every 5 years.  You may need to have your cholesterol levels checked more often if:  Your lipid or cholesterol levels are high.  You are older than 68 years of age.  You are at high risk for heart disease.  What should I know about cancer screening?  Many types of cancers can be detected early and may often be prevented. Depending on your health history and family history, you may need to have cancer screening at various ages. This may include screening for:  Colorectal cancer.  Prostate cancer.  Skin cancer.  Lung  cancer.  What should I know about heart disease, diabetes, and high blood pressure?  Blood pressure and heart disease  High blood pressure causes heart disease and increases the risk of stroke. This is more likely to develop in people who have high blood pressure readings or are overweight.  Talk with your health care provider about your target blood pressure readings.  Have your blood pressure checked:  Every 3-5 years if you are 24-52 years of age.  Every year if you are 3 years old or older.  If you are between the ages of 60 and 72 and are a current or former smoker, ask your health care provider if you should have a one-time screening for abdominal aortic aneurysm (AAA).  Diabetes  Have regular diabetes screenings. This checks your fasting blood sugar level. Have the screening done:  Once every three years after age 66 if you are at a normal weight and have a low risk for diabetes.  More often and at a younger age if you are overweight or have a high risk for diabetes.  What should I know about preventing infection?  Hepatitis B  If you have a higher risk for hepatitis B, you should be screened for this virus. Talk with your health care provider to find out if you are at risk for hepatitis B infection.  Hepatitis C  Blood testing is recommended for:  Everyone born from [birth year] through 1965.  Anyone  with known risk factors for hepatitis C.  Sexually transmitted infections (STIs)  You should be screened each year for STIs, including gonorrhea and chlamydia, if:  You are sexually active and are younger than 68 years of age.  You are older than 68 years of age and your health care provider tells you that you are at risk for this type of infection.  Your sexual activity has changed since you were last screened, and you are at increased risk for chlamydia or gonorrhea. Ask your health care provider if you are at risk.  Ask your health care provider about whether you are at high risk for HIV. Your health care provider  may recommend a prescription medicine to help prevent HIV infection. If you choose to take medicine to prevent HIV, you should first get tested for HIV. You should then be tested every 3 months for as long as you are taking the medicine.  Follow these instructions at home:  Alcohol use  Do not drink alcohol if your health care provider tells you not to drink.  If you drink alcohol:  Limit how much you have to 0-2 drinks a day.  Know how much alcohol is in your drink. In the [place] one drink equals one 12 oz bottle of beer (355 mL), one 5 oz glass of wine (148 mL), or one 1 oz glass of hard liquor (44 mL).  Lifestyle  Do not use any products that contain nicotine or tobacco. These products include cigarettes, chewing tobacco, and vaping devices, such as e-cigarettes. If you need help quitting, ask your health care provider.  Do not use street drugs.  Do not share needles.  Ask your health care provider for help if you need support or information about quitting drugs.  General instructions  Schedule regular health, dental, and eye exams.  Stay current with your vaccines.  Tell your health care provider if:  You often feel depressed.  You have ever been abused or do not feel safe at home.  Summary  Adopting a healthy lifestyle and getting preventive care are important in promoting health and wellness.  Follow your health care provider's instructions about healthy diet, exercising, and getting tested or screened for diseases.  Follow your health care provider's instructions on monitoring your cholesterol and blood pressure.  This information is not intended to replace advice given to you by your health care provider. Make sure you discuss any questions you have with your health care provider.  Document Revised: 06/22/2020 Document Reviewed: 06/22/2020  Elsevier Patient Education  2024 [employer].

## 2023-11-01 NOTE — Progress Notes (Signed)
 "  Subjective:  Patient ID: Michael Li, male    DOB: October 05, 1955  Age: 68 y.o. MRN: [id number]  CC: Annual Exam, Hypertension, Diabetes, and Hyperlipidemia   HPI TERRICK ALLRED presents for a CPX and f/up ---  Discussed the use of AI scribe software for clinical note transcription with the patient, who gave verbal consent to proceed.  History of Present Illness Michael Li is a 68 year old male with atrial fibrillation who presents for a routine follow-up visit.  He feels well with no dizziness, lightheadedness, chest pain, or shortness of breath. He does not experience an irregular heartbeat and monitors his heart rhythm weekly using a device on his phone. He remains active and frequently helps others.  He reports urinary issues, describing slow urination with weak urine flow, but denies dysuria. He attributes these symptoms to an enlarged prostate, jokingly estimating its size to be that of a grapefruit.     Outpatient Medications Prior to Visit  Medication Sig Dispense Refill   alfuzosin  (UROXATRAL ) 10 MG 24 hr tablet Take 1 tablet (10 mg total) by mouth daily with breakfast. Take 1 by mouth daily with breakfast 90 tablet 1   fenofibrate  (TRICOR ) 145 MG tablet Take 1 tablet (145 mg total) by mouth daily. 90 tablet 2   Insulin  Pen Needle (BD PEN NEEDLE MICRO U/F) 32G X 6 MM MISC Inject 1 Act into the skin daily. 200 each 3   metFORMIN  (GLUCOPHAGE -XR) 750 MG 24 hr tablet Take 2 tablets (1,500 mg total) by mouth daily with breakfast. 180 tablet 1   metoprolol  succinate (TOPROL -XL) 25 MG 24 hr tablet Take 1 tablet (25 mg total) by mouth daily. 90 tablet 1   omeprazole  (PRILOSEC) 20 MG capsule Take 1 capsule (20 mg total) by mouth daily. 90 capsule 1   rivaroxaban  (XARELTO ) 20 MG TABS tablet TAKE 1 TABLET(20 MG) BY MOUTH DAILY WITH SUPPER 90 tablet 1   tadalafil  (CIALIS ) 20 MG tablet TAKE 1 TABLET BY MOUTH DAILY AS NEEDED FOR E.D. 10 tablet 5   atorvastatin  (LIPITOR) 10 MG  tablet Take 1 tablet (10 mg total) by mouth daily. TAKE 1 TABLET(10 MG) BY MOUTH DAILY 90 tablet 1   Continuous Glucose Receiver (FREESTYLE LIBRE 14 DAY READER) DEVI USE DAILY AS DIRECTED 2 each 5   Continuous Glucose Sensor (FREESTYLE LIBRE 14 DAY SENSOR) MISC USE AND CHANGE EVERY 14 DAYS AS DIRECTED 2 each 5   FARXIGA  10 MG TABS tablet Take 1 tablet (10 mg total) by mouth daily. 90 tablet 0   insulin  glargine, 1 Unit Dial , (TOUJEO  SOLOSTAR) 300 UNIT/ML Solostar Pen Inject 22 Units into the skin daily. 7.5 mL 0   lisdexamfetamine  (VYVANSE ) 30 MG capsule Take 1 capsule (30 mg total) by mouth daily. 30 capsule 0   No facility-administered medications prior to visit.    ROS Review of Systems  Constitutional:  Negative for appetite change, chills, diaphoresis, fatigue and fever.  HENT: Negative.  Negative for trouble swallowing.   Eyes: Negative.   Respiratory:  Negative for cough, chest tightness, shortness of breath and wheezing.   Cardiovascular:  Negative for chest pain, palpitations and leg swelling.  Gastrointestinal: Negative.  Negative for abdominal pain, constipation, diarrhea, nausea and vomiting.  Endocrine: Negative.   Genitourinary:  Positive for difficulty urinating. Negative for dysuria, hematuria, penile swelling, scrotal swelling, testicular pain and urgency.  Musculoskeletal:  Negative for arthralgias and myalgias.  Skin: Negative.   Neurological:  Negative for dizziness,  seizures and weakness.  Hematological:  Negative for adenopathy. Does not bruise/bleed easily.  Psychiatric/Behavioral:  Negative for confusion.     Objective:  BP 136/68 (BP Location: Left Arm, Patient Position: Sitting, Cuff Size: Normal)   Pulse (!) 57   Temp 97.9 F (36.6 C) (Oral)   Resp 16   Ht 5' 8 (1.727 m)   Wt 156 lb 3.2 oz (70.9 kg)   SpO2 94%   BMI 23.75 kg/m   BP Readings from Last 3 Encounters:  11/01/23 136/68  05/25/23 134/76  01/30/23 120/70    Wt Readings from Last 3  Encounters:  11/01/23 156 lb 3.2 oz (70.9 kg)  05/25/23 152 lb 6.4 oz (69.1 kg)  01/30/23 157 lb 3.2 oz (71.3 kg)    Physical Exam Vitals reviewed.  Constitutional:      Appearance: Normal appearance.  HENT:     Mouth/Throat:     Mouth: Mucous membranes are moist.  Eyes:     General: No scleral icterus.    Conjunctiva/sclera: Conjunctivae normal.  Cardiovascular:     Rate and Rhythm: Normal rate and regular rhythm. Frequent Extrasystoles are present.    Heart sounds: No murmur heard.    No friction rub. No gallop.     Comments: EKG--- SR with PAC's (bigeminy) -new-, 63 bpm No LVH, Q waves, or ST/T wave changes  Pulmonary:     Effort: Pulmonary effort is normal.     Breath sounds: No stridor. No wheezing, rhonchi or rales.  Abdominal:     General: Abdomen is flat.     Palpations: There is no mass.     Tenderness: There is no abdominal tenderness. There is no guarding.     Hernia: No hernia is present.  Musculoskeletal:     Cervical back: Neck supple.     Right lower leg: No edema.     Left lower leg: No edema.  Skin:    General: Skin is warm and dry.     Coloration: Skin is not pale.     Findings: No rash.  Neurological:     General: No focal deficit present.     Mental Status: He is alert. Mental status is at baseline.  Psychiatric:        Mood and Affect: Mood normal.        Behavior: Behavior normal.     Lab Results  Component Value Date   WBC 4.2 11/01/2023   HGB 15.0 11/01/2023   HCT 43.8 11/01/2023   PLT 173.0 11/01/2023   GLUCOSE 172 (H) 11/01/2023   CHOL 143 05/25/2023   TRIG 98.0 05/25/2023   HDL 51.60 05/25/2023   LDLDIRECT 58.0 05/09/2022   LDLCALC 72 05/25/2023   ALT 19 11/01/2023   AST 21 11/01/2023   NA 140 11/01/2023   K 4.5 11/01/2023   CL 104 11/01/2023   CREATININE 0.84 11/01/2023   BUN 23 11/01/2023   CO2 26 11/01/2023   TSH 1.63 11/01/2023   PSA 1.74 11/01/2023   INR 1.8 (H) 11/01/2023   HGBA1C 7.7 (H) 11/01/2023   MICROALBUR  <0.7 05/25/2023    CT Chest Wo Contrast Result Date: 02/07/2023 CLINICAL DATA:  Lung nodule, 6-27mm lung nodule EXAM: CT CHEST WITHOUT CONTRAST TECHNIQUE: Multidetector CT imaging of the chest was performed following the standard protocol without IV contrast. RADIATION DOSE REDUCTION: This exam was performed according to the departmental dose-optimization program which includes automated exposure control, adjustment of the mA and/or kV according to patient size  and/or use of iterative reconstruction technique. COMPARISON:  CT scan chest from 02/10/2022. FINDINGS: Cardiovascular: Normal cardiac size. No pericardial effusion. No aortic aneurysm. There are coronary artery calcifications, in keeping with coronary artery disease. There are also mild-to-moderate peripheral atherosclerotic vascular calcifications of thoracic aorta and its major branches. Mediastinum/Nodes: Visualized thyroid  gland appears grossly unremarkable. No solid / cystic mediastinal masses. The esophagus is nondistended precluding optimal assessment. There is mild circumferential thickening of the lower thoracic esophagus, which is most likely seen in the settings of chronic gastroesophageal reflux disease versus esophagitis. No mediastinal or axillary lymphadenopathy by size criteria. Evaluation of bilateral hila is limited due to lack on intravenous contrast: however, no large hilar lymphadenopathy identified. Lungs/Pleura: The central tracheo-bronchial tree is patent. Mild-to-moderate upper lobe predominant emphysematous changes noted. There are patchy areas of linear, plate-like atelectasis and/or scarring throughout bilateral lungs. No mass or consolidation. No pleural effusion or pneumothorax. No new or suspicious lung nodules. Redemonstration of focal scarring in the left lung apex (series four, image thirty two), which is essentially similar to the prior study. Upper Abdomen: Small volume layering calculi/sludge noted in the gallbladder  without imaging signs of acute cholecystitis. Remaining visualized upper abdominal viscera within normal limits. Musculoskeletal: The visualized soft tissues of the chest wall are grossly unremarkable. No suspicious osseous lesions. There are mild multilevel degenerative changes in the visualized spine. Subacute/healing left fourth, fifth, sixth, tenth and eleventh rib fractures noted. IMPRESSION: *Redemonstration of focal scarring in the left lung apex, which is essentially similar to the prior study. No new or suspicious lung nodule. *Multiple other nonacute observations, as described above. Aortic Atherosclerosis (ICD10-I70.0) and Emphysema (ICD10-J43.9). Electronically Signed   By: Ree Molt M.D.   On: 02/07/2023 20:32    Assessment & Plan:   Essential hypertension- BP is well controlled. -     TSH; Future -     Basic metabolic panel with GFR; Future -     CBC with Differential/Platelet; Future  PAF (paroxysmal atrial fibrillation) (HCC)- He has good R/R control. -     TSH; Future -     EKG 12-Lead -     AMB Referral [facility] Care Management  Insulin -requiring or dependent type II diabetes mellitus (HCC)- Blood sugar is adequately well controlled. -     Basic metabolic panel with GFR; Future -     Hemoglobin A1c; Future -     Ambulatory referral to Ophthalmology -     Toujeo  SoloStar; Inject 22 Units into the skin daily.  Dispense: 7.5 mL; Refill: 0 -     FreeStyle Libre 3 Plus Sensor; Apply 1 Act topically every 14 (fourteen) days. Change sensor every 15 days.  Dispense: 6 each; Refill: 1 -     Farxiga ; Take 1 tablet (10 mg total) by mouth daily.  Dispense: 90 tablet; Refill: 0 -     AMB Referral [facility]BCI Care Management  Hyperlipidemia LDL goal <100 -     TSH; Future -     Atorvastatin  Calcium ; Take 1 tablet (10 mg total) by mouth daily. TAKE 1 TABLET(10 MG) BY MOUTH DAILY  Dispense: 90 tablet; Refill: 0  Gastroesophageal reflux disease without esophagitis -     Ambulatory referral to  Gastroenterology  Need for immunization against influenza -     Flu vaccine HIGH DOSE PF(Fluzone Trivalent)  Non-alcoholic cirrhosis (HCC) -     Hepatic function panel; Future -     Protime-INR; Future -     Ambulatory referral to Gastroenterology -  AMB Referral [facility] Care Management  Benign prostatic hyperplasia with weak urinary stream -     PSA; Future -     Urinalysis, Routine w reflex microscopic; Future  Attention deficit hyperactivity disorder (ADHD), predominantly inattentive type -     Lisdexamfetamine  Dimesylate; Take 1 capsule (30 mg total) by mouth daily.  Dispense: 30 capsule; Refill: 0     Follow-up: Return in about 6 months (around 04/30/2024).  Debby Molt, MD "

## 2023-11-02 ENCOUNTER — Ambulatory Visit: Payer: Self-pay | Admitting: Internal Medicine

## 2023-11-03 MED ORDER — ATORVASTATIN CALCIUM 10 MG PO TABS: 10.0000 mg | ORAL_TABLET | Freq: Every day | ORAL | 0 refills | Status: DC

## 2023-11-03 NOTE — Assessment & Plan Note (Signed)
Exam completed, labs reviewed, vaccines reviewed and updated, cancer screenings addressed, pt ed material was given.

## 2023-11-06 ENCOUNTER — Encounter: Payer: Self-pay | Admitting: Internal Medicine

## 2023-11-09 ENCOUNTER — Ambulatory Visit: Attending: Cardiology | Admitting: Cardiology

## 2023-11-09 ENCOUNTER — Encounter: Payer: Self-pay | Admitting: Cardiology

## 2023-11-09 VITALS — BP 127/68 | HR 67 | Resp 16 | Ht 68.0 in | Wt 157.0 lb

## 2023-11-09 DIAGNOSIS — Z794 Long term (current) use of insulin: Secondary | ICD-10-CM | POA: Diagnosis not present

## 2023-11-09 DIAGNOSIS — J869 Pyothorax without fistula: Secondary | ICD-10-CM

## 2023-11-09 DIAGNOSIS — R931 Abnormal findings on diagnostic imaging of heart and coronary circulation: Secondary | ICD-10-CM

## 2023-11-09 DIAGNOSIS — I2510 Atherosclerotic heart disease of native coronary artery without angina pectoris: Secondary | ICD-10-CM | POA: Diagnosis not present

## 2023-11-09 DIAGNOSIS — I2584 Coronary atherosclerosis due to calcified coronary lesion: Secondary | ICD-10-CM

## 2023-11-09 DIAGNOSIS — Z87891 Personal history of nicotine dependence: Secondary | ICD-10-CM

## 2023-11-09 DIAGNOSIS — Z7901 Long term (current) use of anticoagulants: Secondary | ICD-10-CM | POA: Diagnosis not present

## 2023-11-09 DIAGNOSIS — E119 Type 2 diabetes mellitus without complications: Secondary | ICD-10-CM

## 2023-11-09 DIAGNOSIS — E1159 Type 2 diabetes mellitus with other circulatory complications: Secondary | ICD-10-CM

## 2023-11-09 DIAGNOSIS — I4819 Other persistent atrial fibrillation: Secondary | ICD-10-CM

## 2023-11-09 MED ORDER — DAPAGLIFLOZIN PROPANEDIOL 10 MG PO TABS: 10.0000 mg | ORAL_TABLET | Freq: Every day | ORAL | 0 refills | Status: AC

## 2023-11-09 MED ORDER — FARXIGA 10 MG PO TABS: 10.0000 mg | ORAL_TABLET | Freq: Every day | ORAL | 3 refills | Status: DC

## 2023-11-09 NOTE — Patient Instructions (Addendum)
 Medication Instructions:   No changes *If you need a refill on your cardiac medications before your next appointment, please call your pharmacy*   Lab Work: Not needed    Testing/Procedures: Not needed   Follow-Up: At [facility], you and your health needs are our priority.  As part of our continuing mission to provide you with exceptional heart care, we have created designated Provider Care Teams.  These Care Teams include your primary Cardiologist (physician) and Advanced Practice Providers (APPs -  Physician Assistants and Nurse Practitioners) who all work together to provide you with the care you need, when you need it.     Your next appointment:   15 month(s)  The format for your next appointment:   In Person  Provider:   Madonna Large, DO

## 2023-11-09 NOTE — Progress Notes (Signed)
 Cardiology Office Note:    NAME:  Michael Li    MRN: [id number] DOB:  [birth year]   PCP:  Joshua Debby CROME, MD  Former Cardiology Providers: None  Primary Cardiologist:  Madonna Large, DO, [facility] (established care 01/30/2023) Electrophysiologist:  Will Gladis Norton, MD   Chief Complaint  Patient presents with   Follow-up    Follow up CAD,Afib    History of Present Illness:    Michael Li is a 68 y.o. 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   Patient was last seen in December 2024 at that time he was provided preoperative restratification for dental extractions/implants.  In addition, his atrial fibrillation was well-controlled on medical therapy and his CAD was being managed with improving his modifiable cardiovascular risk factors.  We started fenofibrate  to help optimize his triglyceride levels.  Who presents today for follow-up.  Patient has successfully completed his dental work and in the process of getting his dentures.  He continues to walk regularly a couple miles each time and has not had any change in physical endurance.  Since starting fenofibrate  his triglyceride levels have improved from 212 mg/dL to 98 mg/dL.  He denies anginal chest pain or heart failure symptoms.  Shortness of breath with effort related activities such as yard work remains chronic and stable.  Current Medications: Current Meds  Medication Sig   alfuzosin  (UROXATRAL ) 10 MG 24 hr tablet Take 1 tablet (10 mg total) by mouth daily with breakfast. Take 1 by mouth daily with breakfast   atorvastatin  (LIPITOR) 10 MG tablet Take 1 tablet  (10 mg total) by mouth daily. TAKE 1 TABLET(10 MG) BY MOUTH DAILY   Continuous Glucose Sensor (FREESTYLE LIBRE 3 PLUS SENSOR) MISC Apply 1 Act topically every 14 (fourteen) days. Change sensor every 15 days.   dapagliflozin  propanediol (FARXIGA ) 10 MG TABS tablet Take 1 tablet (10 mg total) by mouth daily before breakfast.   fenofibrate  (TRICOR ) 145 MG tablet Take 1 tablet (145 mg total) by mouth daily.   insulin  glargine, 1 Unit Dial , (TOUJEO  SOLOSTAR) 300 UNIT/ML Solostar Pen Inject 22 Units into the skin daily.   Insulin  Pen Needle (BD PEN NEEDLE MICRO U/F) 32G X 6 MM MISC Inject 1 Act into the skin daily.   lisdexamfetamine (VYVANSE ) 30 MG capsule Take 1 capsule (30 mg total) by mouth daily.   metFORMIN  (GLUCOPHAGE -XR) 750 MG 24 hr tablet Take 2 tablets (1,500 mg total) by mouth daily with breakfast.   metoprolol  succinate (TOPROL -XL) 25 MG 24 hr tablet Take 1 tablet (25 mg total) by mouth daily.   omeprazole  (PRILOSEC) 20 MG capsule Take 1 capsule (20 mg total) by mouth daily.   rivaroxaban  (XARELTO ) 20 MG TABS tablet TAKE 1 TABLET(20 MG) BY MOUTH DAILY WITH SUPPER   tadalafil  (CIALIS ) 20 MG tablet TAKE 1 TABLET BY MOUTH DAILY AS NEEDED FOR E.D.   [DISCONTINUED] FARXIGA  10 MG TABS tablet Take 1 tablet (10 mg total) by mouth daily.     Allergies:    Cephalexin and Edarbi  [azilsartan]   Past Medical History: Past Medical History:  Diagnosis Date   Anemia    rec'd 32 units of blood, post T&A, 1981   ATTENTION DEFICIT HYPERACTIVITY DISORDER 07/19/2006  BENIGN PROSTATIC HYPERTROPHY 07/19/2006   DIABETES MELLITUS, TYPE II 07/19/2006   Dyspnea    had shortness of breath when heart was irregular   Dysrhythmia    history of a-fib   ERECTILE DYSFUNCTION 07/19/2006   GERD 07/19/2006   HEPATITIS C, CHRONIC 04/03/2007   Treated with Harboni    History of blood transfusion    NEOPLASM, MALIGNANT, CARCINOMA, BASAL CELL, NOSE 10/20/2009   Basal cell    Past Surgical History: Past Surgical History:   Procedure Laterality Date   APPENDECTOMY     ATRIAL FIBRILLATION ABLATION N/A 03/27/2019   Procedure: ATRIAL FIBRILLATION ABLATION;  Surgeon: Inocencio Soyla Lunger, MD;  Location: [facility] INVASIVE CV LAB;  Service: Cardiovascular;  Laterality: N/A;   ATRIAL FIBRILLATION ABLATION N/A 06/19/2019   Procedure: ATRIAL FIBRILLATION ABLATION;  Surgeon: Inocencio Soyla Lunger, MD;  Location: [facility] INVASIVE CV LAB;  Service: Cardiovascular;  Laterality: N/A;   carotid injury       post tonsillectomy, R carotid injury with trach   HERNIA REPAIR  1973   inguinal- R side   KNEE SURGERY Right    LUMBAR LAMINECTOMY/DECOMPRESSION MICRODISCECTOMY Right 09/12/2012   Procedure: LUMBAR LAMINECTOMY/DECOMPRESSION MICRODISCECTOMY 1 LEVEL;  Surgeon: Victory DELENA Gunnels, MD;  Location: [facility] NEURO ORS;  Service: Neurosurgery;  Laterality: Right;  Right lumbar three-four microdiscectomy   ORIF SHOULDER FRACTURE Right 06/02/2017   Procedure: OPEN REDUCTION INTERNAL FIXATION (ORIF) RIGHT GLENOID FRACTURE, POSSIBLE LATARJET CORACOID TRANSFER;  Surgeon: Addie Cordella Hamilton, MD;  Location: [facility] OR;  Service: Orthopedics;  Laterality: Right;   TEE WITHOUT CARDIOVERSION N/A 06/19/2019   Procedure: TRANSESOPHAGEAL ECHOCARDIOGRAM (TEE);  Surgeon: Alveta Aleene PARAS, MD;  Location: [facility] INVASIVE CV LAB;  Service: Cardiovascular;  Laterality: N/A;   TONSILLECTOMY     TRACHEOSTOMY CLOSURE  1981   UMBILICAL HERNIA REPAIR N/A 12/08/2020   Procedure: HERNIA REPAIR UMBILICAL;  Surgeon: Kinsinger, Herlene Righter, MD;  Location: [facility] ORS;  Service: General;  Laterality: N/A;    Social History: Social History   Tobacco Use   Smoking status: Former    Current packs/day: 0.00    Types: Cigarettes    Start date: 1973    Quit date: 2007    Years since quitting: 18.7    Passive exposure: Past   Smokeless tobacco: Former    Types: Chew    Quit date: 02/15/2007  Vaping Use   Vaping status: Never Used  Substance Use Topics   Alcohol use: Yes    Comment: 3 glasses of  wine per week    Drug use: Yes    Types: Marijuana    Comment: gummies occasionally    Family History: Family History  Problem Relation Age of Onset   COPD Mother    Heart attack Father    Diabetes Maternal Aunt    Cancer Paternal Grandmother        colon cancer   Diabetes Maternal Aunt     ROS:   Review of Systems  Cardiovascular:  Positive for dyspnea on exertion (chronic and stable). Negative for chest pain, claudication, irregular heartbeat, leg swelling, near-syncope, orthopnea, palpitations, paroxysmal nocturnal dyspnea and syncope.  Hematologic/Lymphatic: Negative for bleeding problem.    EKGs/Labs/Other Studies Reviewed:   EKG: November 01, 2023: Sinus rhythm, 63 bpm, PACs in atrial bigeminy pattern  Echocardiogram: TEE 03/27/2019 Left atrial size was moderately dilated. A left atrial/left atrial appendage thrombus was detected.   TEE 06/24/2019 LVEF 35-40%, global hypokinesis. No left atrial/left atrial appendage thrombus was detected.  See  report for additional details  11/2020  1. Left ventricular ejection fraction by 3D volume is 60 %. The left  ventricle has normal function. The left ventricle has no regional wall  motion abnormalities. Left ventricular diastolic parameters were normal.   2. Right ventricular systolic function is normal. The right ventricular  size is normal. There is normal pulmonary artery systolic pressure. The  estimated right ventricular systolic pressure is 29.0 mmHg.   3. The mitral valve is normal in structure. Trivial mitral valve  regurgitation. No evidence of mitral stenosis.   4. The aortic valve is tricuspid. Aortic valve regurgitation is not  visualized. No aortic stenosis is present.   5. The inferior vena cava is normal in size with greater than 50%  respiratory variability, suggesting right atrial pressure of 3 mmHg.   CT cardiac morphology/pulmonary vein: 03/2019 1.  Possible LA appendage thrombus.   2.  Pulmonary  veins as noted above.   3. Coronary artery calcium  score 2254 Agatston units. This places the patient in the 98th percentile for age and gender, suggesting high risk for future cardiac events.   4. Extensive plaque in the proximal to mid LAD, looks to be around 50% stenosis at most but will confirm by FFR.   5. Extensive proximal to mid LCx plaque, looks < 50% stenosis but will confirm by FFR.   6. Motion artifact degrades RCA images, but I am concerned for severe mid RCA stenosis. Probably will not be able to perform FFR on this vessel due to artifact.   CTFFR 03/2019 1.  The RCA could not be analyzed due to artifact.   2.  No evidence for hemodynamically significant disease in the LAD.   3. Probably no hemodynamically significant disease in the LCx. The distal LCx FFR is borderline but is in the distal vessel and unlikely to be significant.  Labs:    Latest Ref Rng & Units 11/01/2023   11:43 AM 05/25/2023   10:00 AM 11/22/2022    1:57 PM  CBC  WBC 4.0 - 10.5 K/uL 4.2  4.1  4.9   Hemoglobin 13.0 - 17.0 g/dL 84.9  84.6  85.5   Hematocrit 39.0 - 52.0 % 43.8  44.7  42.3   Platelets 150.0 - 400.0 K/uL 173.0  177.0  166.0        Latest Ref Rng & Units 11/01/2023   11:43 AM 05/25/2023   10:00 AM 11/22/2022    1:57 PM  BMP  Glucose 70 - 99 mg/dL 827  807  846   BUN 6 - 23 mg/dL 23  21  17    Creatinine 0.40 - 1.50 mg/dL 9.15  8.99  9.10   Sodium 135 - 145 mEq/L 140  140  140   Potassium 3.5 - 5.1 mEq/L 4.5  5.0  5.1   Chloride 96 - 112 mEq/L 104  102  104   CO2 19 - 32 mEq/L 26  29  28    Calcium  8.4 - 10.5 mg/dL 89.8  89.9  9.7       Latest Ref Rng & Units 11/01/2023   11:43 AM 05/25/2023   10:00 AM 11/22/2022    1:57 PM  CMP  Glucose 70 - 99 mg/dL 827  807  846   BUN 6 - 23 mg/dL 23  21  17    Creatinine 0.40 - 1.50 mg/dL 9.15  8.99  9.10   Sodium 135 - 145 mEq/L 140  140  140   Potassium 3.5 -  5.1 mEq/L 4.5  5.0  5.1   Chloride 96 - 112 mEq/L 104  102  104   CO2 19 - 32  mEq/L 26  29  28    Calcium  8.4 - 10.5 mg/dL 89.8  89.9  9.7   Total Protein 6.0 - 8.3 g/dL 6.6  7.0  6.5   Total Bilirubin 0.2 - 1.2 mg/dL 0.9  0.7  0.8   Alkaline Phos 39 - 117 U/L 45  53  85   AST 0 - 37 U/L 21  24  33   ALT 0 - 53 U/L 19  20  35     Lab Results  Component Value Date   CHOL 143 05/25/2023   HDL 51.60 05/25/2023   LDLCALC 72 05/25/2023   LDLDIRECT 58.0 05/09/2022   TRIG 98.0 05/25/2023   CHOLHDL 3 05/25/2023   No results for input(s): LIPOA in the last 8760 hours. No components found for: NTPROBNP No results for input(s): PROBNP in the last 8760 hours. Recent Labs    11/22/22 1357 11/01/23 1143  TSH 1.07 1.63    Physical Exam:    Today's Vitals   11/09/23 1039  BP: 127/68  Pulse: 67  Resp: 16  SpO2: 94%  Height: 5' 8 (1.727 m)   Body mass index is 23.75 kg/m. Wt Readings from Last 3 Encounters:  11/01/23 156 lb 3.2 oz (70.9 kg)  05/25/23 152 lb 6.4 oz (69.1 kg)  01/30/23 157 lb 3.2 oz (71.3 kg)    Physical Exam  Constitutional: No distress.  hemodynamically stable  Neck: No JVD present.  Cardiovascular: Normal rate, regular rhythm, S1 normal and S2 normal. Exam reveals no gallop, no S3 and no S4.  No murmur heard. Pulmonary/Chest: Effort normal and breath sounds normal. No stridor. He has no wheezes. He has no rales.  Abdominal: Soft. Bowel sounds are normal. He exhibits no distension. There is no abdominal tenderness.  Musculoskeletal:        General: No edema.     Cervical back: Neck supple.  Neurological: He is alert and oriented to person, place, and time. He has intact cranial nerves (2-12).  Skin: Skin is warm.     Impression & Recommendation(s):  Impression:   ICD-10-CM   1. Persistent atrial fibrillation (HCC)  I48.19     2. Long term (current) use of anticoagulants  Z79.01     3. Coronary atherosclerosis due to calcified coronary lesion  I25.10    I25.84     4. Agatston CAC score, >400  R93.1     5. Type 2  diabetes mellitus with other circulatory complication, with long-term current use of insulin  (HCC)  E11.59 FARXIGA  10 MG TABS tablet   Z79.4 dapagliflozin  propanediol (FARXIGA ) 10 MG TABS tablet    6. Former smoker  Z87.891     7. Empyema (HCC)  J86.9        Recommendation(s):  Persistent atrial fibrillation (HCC) Rate control: Toprol -XL 25 mg p.o. daily Rhythm control: N/A. Thromboembolic prophylaxis: Xarelto  20 mg p.o. supper History of atrial fibrillation ablation and follows with Dr. Inocencio longitudinally. Recent EKG illustrates sinus rhythm Estimated Creatinine Clearance: 81.4 mL/min (by C-G formula based on SCr of 0.84 mg/dL).  Long term (current) use of anticoagulants Indication: Persistent atrial fibrillation Does not endorse evidence of bleeding. Risks, benefits, and alternatives to oral anticoagulation discussed  Coronary atherosclerosis due to calcified coronary lesion Agatston CAC score, >400 Continue Lipitor 10 mg p.o. daily. Continue fenofibrate  145 mg p.o.  daily.   LDL levels are well-controlled as of April 2025 72 mg/dL. Triglyceride levels have improved from 212 mg/dL to 98 mg/dL as of April 2025   Orders Placed:  No orders of the defined types were placed in this encounter.  Final Medication List:    Meds ordered this encounter  Medications   FARXIGA  10 MG TABS tablet    Sig: Take 1 tablet (10 mg total) by mouth daily.    Dispense:  90 tablet    Refill:  3   dapagliflozin  propanediol (FARXIGA ) 10 MG TABS tablet    Sig: Take 1 tablet (10 mg total) by mouth daily before breakfast.    Dispense:  14 tablet    Refill:  0    Lot Number?:   [id number]    Expiration Date?:   07/14/2025    Quantity:   14    Medications Discontinued During This Encounter  Medication Reason   FARXIGA  10 MG TABS tablet Reorder     Current Outpatient Medications:    alfuzosin  (UROXATRAL ) 10 MG 24 hr tablet, Take 1 tablet (10 mg total) by mouth daily with breakfast. Take 1 by  mouth daily with breakfast, Disp: 90 tablet, Rfl: 1   atorvastatin  (LIPITOR) 10 MG tablet, Take 1 tablet (10 mg total) by mouth daily. TAKE 1 TABLET(10 MG) BY MOUTH DAILY, Disp: 90 tablet, Rfl: 0   Continuous Glucose Sensor (FREESTYLE LIBRE 3 PLUS SENSOR) MISC, Apply 1 Act topically every 14 (fourteen) days. Change sensor every 15 days., Disp: 6 each, Rfl: 1   dapagliflozin  propanediol (FARXIGA ) 10 MG TABS tablet, Take 1 tablet (10 mg total) by mouth daily before breakfast., Disp: 14 tablet, Rfl: 0   fenofibrate  (TRICOR ) 145 MG tablet, Take 1 tablet (145 mg total) by mouth daily., Disp: 90 tablet, Rfl: 2   insulin  glargine, 1 Unit Dial , (TOUJEO  SOLOSTAR) 300 UNIT/ML Solostar Pen, Inject 22 Units into the skin daily., Disp: 7.5 mL, Rfl: 0   Insulin  Pen Needle (BD PEN NEEDLE MICRO U/F) 32G X 6 MM MISC, Inject 1 Act into the skin daily., Disp: 200 each, Rfl: 3   lisdexamfetamine (VYVANSE ) 30 MG capsule, Take 1 capsule (30 mg total) by mouth daily., Disp: 30 capsule, Rfl: 0   metFORMIN  (GLUCOPHAGE -XR) 750 MG 24 hr tablet, Take 2 tablets (1,500 mg total) by mouth daily with breakfast., Disp: 180 tablet, Rfl: 1   metoprolol  succinate (TOPROL -XL) 25 MG 24 hr tablet, Take 1 tablet (25 mg total) by mouth daily., Disp: 90 tablet, Rfl: 1   omeprazole  (PRILOSEC) 20 MG capsule, Take 1 capsule (20 mg total) by mouth daily., Disp: 90 capsule, Rfl: 1   rivaroxaban  (XARELTO ) 20 MG TABS tablet, TAKE 1 TABLET(20 MG) BY MOUTH DAILY WITH SUPPER, Disp: 90 tablet, Rfl: 1   tadalafil  (CIALIS ) 20 MG tablet, TAKE 1 TABLET BY MOUTH DAILY AS NEEDED FOR E.D., Disp: 10 tablet, Rfl: 5   FARXIGA  10 MG TABS tablet, Take 1 tablet (10 mg total) by mouth daily., Disp: 90 tablet, Rfl: 3  Consent:   NA  Disposition:   51-month follow-up visit. Patient may be asked to follow-up sooner based on the results of the above-mentioned testing.  His questions and concerns were addressed to his satisfaction. He voices understanding of the  recommendations provided during this encounter.    Signed, Madonna Michele HAS, [facility]  [facility]  A Division of [facility] [address], [place], [place] [address]  12:04 PM

## 2023-11-10 ENCOUNTER — Telehealth: Payer: Self-pay | Admitting: *Deleted

## 2023-11-10 NOTE — Progress Notes (Signed)
 Care Guide Pharmacy Note  11/10/2023 Name: Michael Li MRN: [id number] DOB: 1955-07-14  Referred By: Joshua Debby CROME, MD Reason for referral: Complex Care Management (Outreach to schedule referral with pharmacist )   Michael Li is a 68 y.o. year old male who is a primary care patient of Joshua Debby CROME, MD.  Michael Li was referred to the pharmacist for assistance related to: HLD and DMII  Successful contact was made with the patient to discuss pharmacy services including being ready for the pharmacist to call at least 5 minutes before the scheduled appointment time and to have medication bottles and any blood pressure readings ready for review. The patient agreed to meet with the pharmacist via telephone visit on 11/15/2023  Michael Li, CMA [place]  [facility], [facility] Guide Direct Dial : [phone]  Fax: [phone] Website: [email or web address]

## 2023-11-12 ENCOUNTER — Encounter: Payer: Self-pay | Admitting: Cardiology

## 2023-11-13 ENCOUNTER — Encounter: Payer: Self-pay | Admitting: Gastroenterology

## 2023-11-15 ENCOUNTER — Other Ambulatory Visit

## 2023-11-15 ENCOUNTER — Other Ambulatory Visit: Admitting: Pharmacist

## 2023-11-15 DIAGNOSIS — Z7984 Long term (current) use of oral hypoglycemic drugs: Secondary | ICD-10-CM

## 2023-11-15 DIAGNOSIS — E119 Type 2 diabetes mellitus without complications: Secondary | ICD-10-CM

## 2023-11-15 DIAGNOSIS — Z794 Long term (current) use of insulin: Secondary | ICD-10-CM

## 2023-11-15 MED ORDER — FREESTYLE LIBRE 3 PLUS SENSOR MISC: 5 refills | Status: DC

## 2023-11-15 MED ORDER — TOUJEO SOLOSTAR 300 UNIT/ML ~~LOC~~ SOPN
25.0000 [IU] | PEN_INJECTOR | Freq: Every day | SUBCUTANEOUS | Status: AC
Start: 2023-11-15 — End: ?

## 2023-11-15 NOTE — Progress Notes (Signed)
 11/15/2023 Name: Michael Li MRN: [id number] DOB: 26-May-1955  Chief Complaint  Patient presents with   Diabetes   Medication Management    EISSA Li is a 68 y.o. year old male who presented for a telephone visit.   They were referred to the pharmacist by their PCP for assistance in managing diabetes.   Subjective:  Care Team: Primary Care Provider: Joshua Debby CROME, MD ; Next Scheduled Visit: none scheduled   Medication Access/Adherence  Current Pharmacy:  [id number] - [place], [place] - [address] AT [facility] [address]  [place] [address] Phone: [phone] Fax: [phone]   Patient reports affordability concerns with their medications: Yes  Patient reports access/transportation concerns to their pharmacy: No  Patient reports adherence concerns with their medications:  No     Diabetes:  Current medications: Metformin  ER 750 mg 2 tablets daily, Farxiga  10 mg daily, Toujeo  22 units daily Medications tried in the past: Invokana , Trulicity , , Humulin  N, Victoza   Current glucose readings: Fasting 125-135. Has not checked post prandial Pt notes he was able to use Freestyle Libre at one point but last fill was sent for 90 DS and it was $300 through insurance   Current meal patterns: Pt feels that increased A1c is likely due to dietary indiscretions   Current medication access support: Unable to afford Farxiga  - taking using samples only.   Macrovascular and Microvascular Risk Reduction:  Statin? yes (atorvastatin ); ACEi/ARB? no; therapy not indicated  Last urinary albumin /creatinine ratio:  Lab Results  Component Value Date   MICRALBCREAT Unable to calculate 05/25/2023   MICRALBCREAT 16.6 10/13/2008   Last eye exam:  Lab Results  Component Value Date   HMDIABEYEEXA Retinopathy (A) 02/15/2022   Last foot exam: 05/25/2023 Tobacco Use:  Tobacco Use: Medium Risk (11/12/2023)   Patient History     Smoking Tobacco Use: Former    Smokeless Tobacco Use: Former    Passive Exposure: Past     Objective:  Lab Results  Component Value Date   HGBA1C 7.7 (H) 11/01/2023    Lab Results  Component Value Date   CREATININE 0.84 11/01/2023   BUN 23 11/01/2023   NA 140 11/01/2023   K 4.5 11/01/2023   CL 104 11/01/2023   CO2 26 11/01/2023    Lab Results  Component Value Date   CHOL 143 05/25/2023   HDL 51.60 05/25/2023   LDLCALC 72 05/25/2023   LDLDIRECT 58.0 05/09/2022   TRIG 98.0 05/25/2023   CHOLHDL 3 05/25/2023    Medications Reviewed Today     Reviewed by Merceda Lela SAUNDERS, RPH (Pharmacist) on 11/15/23 at 1651  Med List Status: <None>   Medication Order Taking? Sig Documenting Provider Last Dose Status Informant  alfuzosin  (UROXATRAL ) 10 MG 24 hr tablet [id number]  Take 1 tablet (10 mg total) by mouth daily with breakfast. Take 1 by mouth daily with breakfast Joshua Debby CROME, MD  Active   atorvastatin  (LIPITOR) 10 MG tablet [id number]  Take 1 tablet (10 mg total) by mouth daily. TAKE 1 TABLET(10 MG) BY MOUTH DAILY Joshua Debby CROME, MD  Active   Continuous Glucose Sensor (FREESTYLE LIBRE 3 PLUS SENSOR) [place] [id number]  Apply 1 Act topically every 14 (fourteen) days. Change sensor every 15 days.  Patient not taking: Reported on 11/15/2023   Joshua Debby CROME, MD  Active   dapagliflozin  propanediol (FARXIGA ) 10 MG TABS tablet [id number] Yes Take 1 tablet (10 mg total) by mouth  daily before breakfast. Michele, Sunit, DO  Active   FARXIGA  10 MG TABS tablet [id number] Yes Take 1 tablet (10 mg total) by mouth daily. Tolia, Sunit, DO  Active   fenofibrate  (TRICOR ) 145 MG tablet [id number]  Take 1 tablet (145 mg total) by mouth daily. Michele Richardson, DO  Active     Discontinued 08/28/13 1717 (Reorder) insulin  glargine, 1 Unit Dial , (TOUJEO  SOLOSTAR) 300 UNIT/ML Solostar Pen [id number] Yes Inject 22 Units into the skin daily. Joshua Debby CROME, MD  Active   Insulin  Pen Needle (BD PEN NEEDLE MICRO  U/F) 32G X 6 MM MISC [id number]  Inject 1 Act into the skin daily. Joshua Debby CROME, MD  Active   lisdexamfetamine (VYVANSE ) 30 MG capsule [id number]  Take 1 capsule (30 mg total) by mouth daily. Joshua Debby CROME, MD  Active   metFORMIN  (GLUCOPHAGE -XR) 750 MG 24 hr tablet [id number] Yes Take 2 tablets (1,500 mg total) by mouth daily with breakfast. Joshua Debby CROME, MD  Active   metoprolol  succinate (TOPROL -XL) 25 MG 24 hr tablet [id number]  Take 1 tablet (25 mg total) by mouth daily. Joshua Debby CROME, MD  Active   omeprazole  (PRILOSEC) 20 MG capsule [id number]  Take 1 capsule (20 mg total) by mouth daily. Joshua Debby CROME, MD  Active   rivaroxaban  (XARELTO ) 20 MG TABS tablet [id number]  TAKE 1 TABLET(20 MG) BY MOUTH DAILY WITH SUPPER Joshua Debby CROME, MD  Active   tadalafil  (CIALIS ) 20 MG tablet [id number]  TAKE 1 TABLET BY MOUTH DAILY AS NEEDED FOR E.D. Joshua Debby CROME, MD  Active               Assessment/Plan:   Diabetes: - Currently uncontrolled; goal A1c <7%. Cardiorenal risk reduction is optimized.. Blood pressure is at goal <130/80. LDL is not at goal.  - Reviewed dietary modifications including increased protein and fiber with meals and snacks, starchy foods in moderation/reasonable portion sizes. - Recommend to continue Farxiga  and Metformin . Recommend to increase Toujeo  to 25 units daily. - Check on Farxiga  PAP status - Will send Rx for Libre 3 sensors for 30 DS to see if price is more reasonable.  Follow Up Plan: 2 weeks  Darrelyn Drum, PharmD, BCPS, CPP Clinical Pharmacist Practitioner [place] [facility] [phone]

## 2023-11-15 NOTE — Patient Instructions (Signed)
 It was a pleasure speaking with you today!  - Recommend to continue Farxiga  and Metformin . Recommend to increase Toujeo  to 25 units daily. - Will check on Farxiga  PAP status - Will send Rx for Libre 3 sensors for 30 day supply to see if price is more reasonable.  Feel free to call with any questions or concerns!  Darrelyn Drum, PharmD, BCPS, CPP Clinical Pharmacist Practitioner [place] [facility] [phone]

## 2023-11-16 ENCOUNTER — Telehealth: Payer: Self-pay

## 2023-11-16 NOTE — Telephone Encounter (Signed)
 Call [employer](Farxiga ) to follow up on pt's application,spoke with a representative explain pt is denied at this time due to income been over 3% poverty limit for a household income of 2 per representative the income for both was 82 thousand.

## 2023-11-30 ENCOUNTER — Other Ambulatory Visit: Admitting: Pharmacist

## 2023-11-30 ENCOUNTER — Encounter: Payer: Self-pay | Admitting: Internal Medicine

## 2023-11-30 DIAGNOSIS — Z794 Long term (current) use of insulin: Secondary | ICD-10-CM

## 2023-11-30 DIAGNOSIS — E119 Type 2 diabetes mellitus without complications: Secondary | ICD-10-CM

## 2023-11-30 NOTE — Patient Instructions (Signed)
 It was a pleasure speaking with you today!  Continue your current regimen.  Feel free to call with any questions or concerns!  Arbutus Leas, PharmD, BCPS, CPP Clinical Pharmacist Practitioner [place] [facility] at [facility] [phone]

## 2023-11-30 NOTE — Progress Notes (Signed)
 11/30/2023 Name: Michael Li MRN: [id number] DOB: [birth year]  Chief Complaint  Patient presents with   Diabetes   Medication Management   Michael Li is a 68 y.o. year old male who presented for a telephone visit.   They were referred to the pharmacist by their PCP for assistance in managing diabetes.   Subjective:  Care Team: Primary Care Provider: Joshua Debby CROME, MD ; Next Scheduled Visit: none scheduled  Medication Access/Adherence  Current Pharmacy:  [id number] - [place], [place] - [address] AT [facility] [address] [place] [address] Phone: [phone] Fax: [phone]   Patient reports affordability concerns with their medications: Yes  Patient reports access/transportation concerns to their pharmacy: No  Patient reports adherence concerns with their medications:  No    Asks for Farxiga  samples  Diabetes:  Current medications: Metformin  ER 750 mg 2 tablets daily, Farxiga  10 mg daily, Toujeo  22 units daily Medications tried in the past: Invokana , Trulicity , Humulin  N, Victoza   *Only has 3 days left of Farxiga  from samples  Current glucose readings:  Using Freestyle Libre 3    Current meal patterns: Pt feels that increased A1c is likely due to dietary indiscretions   Current medication access support: Unable to afford Farxiga  - taking using samples only. Farxiga  PAP denied due to income.  Macrovascular and Microvascular Risk Reduction:  Statin? yes (atorvastatin ); ACEi/ARB? no; therapy not indicated  Last urinary albumin /creatinine ratio:  Lab Results  Component Value Date   MICRALBCREAT Unable to calculate 05/25/2023   MICRALBCREAT 16.6 10/13/2008   Last eye exam:  Lab Results  Component Value Date   HMDIABEYEEXA Retinopathy (A) 02/15/2022   Last foot exam: 05/25/2023 Tobacco Use:  Tobacco Use: Medium Risk (11/12/2023)   Patient History    Smoking Tobacco Use: Former     Smokeless Tobacco Use: Former    Passive Exposure: Past     Objective:  Lab Results  Component Value Date   HGBA1C 7.7 (H) 11/01/2023    Lab Results  Component Value Date   CREATININE 0.84 11/01/2023   BUN 23 11/01/2023   NA 140 11/01/2023   K 4.5 11/01/2023   CL 104 11/01/2023   CO2 26 11/01/2023    Lab Results  Component Value Date   CHOL 143 05/25/2023   HDL 51.60 05/25/2023   LDLCALC 72 05/25/2023   LDLDIRECT 58.0 05/09/2022   TRIG 98.0 05/25/2023   CHOLHDL 3 05/25/2023    Medications Reviewed Today     Reviewed by Merceda Lela SAUNDERS, RPH (Pharmacist) on 11/30/23 at 1624  Med List Status: <None>   Medication Order Taking? Sig Documenting Provider Last Dose Status Informant  alfuzosin  (UROXATRAL ) 10 MG 24 hr tablet [id number]  Take 1 tablet (10 mg total) by mouth daily with breakfast. Take 1 by mouth daily with breakfast Joshua Debby CROME, MD  Active   atorvastatin  (LIPITOR) 10 MG tablet [id number]  Take 1 tablet (10 mg total) by mouth daily. TAKE 1 TABLET(10 MG) BY MOUTH DAILY Joshua Debby CROME, MD  Active   Continuous Glucose Sensor (FREESTYLE LIBRE 3 PLUS SENSOR) MISC [id number]  Change sensor every 15 days. Joshua Debby CROME, MD  Active   dapagliflozin  propanediol (FARXIGA ) 10 MG TABS tablet [id number]  Take 1 tablet (10 mg total) by mouth daily before breakfast. Michele, Sunit, DO  Active   FARXIGA  10 MG TABS tablet [id number] Yes Take 1 tablet (10 mg total) by mouth daily. Michele, Sunit,  DO  Active   fenofibrate  (TRICOR ) 145 MG tablet [id number]  Take 1 tablet (145 mg total) by mouth daily. Michele Richardson, DO  Active     Discontinued 08/28/13 1717 (Reorder) insulin  glargine, 1 Unit Dial , (TOUJEO  SOLOSTAR) 300 UNIT/ML Solostar Pen [id number] Yes Inject 25 Units into the skin daily. Joshua Debby CROME, MD  Active   Insulin  Pen Needle (BD PEN NEEDLE MICRO U/F) 32G X 6 MM MISC [id number]  Inject 1 Act into the skin daily. Joshua Debby CROME, MD  Active   lisdexamfetamine (VYVANSE ) 30 MG  capsule [id number]  Take 1 capsule (30 mg total) by mouth daily. Joshua Debby CROME, MD  Active   metFORMIN  (GLUCOPHAGE -XR) 750 MG 24 hr tablet [id number] Yes Take 2 tablets (1,500 mg total) by mouth daily with breakfast. Joshua Debby CROME, MD  Active   metoprolol  succinate (TOPROL -XL) 25 MG 24 hr tablet [id number]  Take 1 tablet (25 mg total) by mouth daily. Joshua Debby CROME, MD  Active   omeprazole  (PRILOSEC) 20 MG capsule [id number]  Take 1 capsule (20 mg total) by mouth daily. Joshua Debby CROME, MD  Active   rivaroxaban  (XARELTO ) 20 MG TABS tablet [id number]  TAKE 1 TABLET(20 MG) BY MOUTH DAILY WITH SUPPER Joshua Debby CROME, MD  Active   tadalafil  (CIALIS ) 20 MG tablet [id number]  TAKE 1 TABLET BY MOUTH DAILY AS NEEDED FOR E.D. Joshua Debby CROME, MD  Active               Assessment/Plan:   Diabetes: - Currently controlled (per GMI on Freestyle Libre 3); goal A1c <7%. Cardiorenal risk reduction is optimized.. Blood pressure is at goal <130/80. LDL is not at goal.  - Reviewed dietary modifications including increased protein and fiber with meals and snacks, starchy foods in moderation/reasonable portion sizes. - Recommend to continue Farxiga  and Metformin . Recommend to continue Toujeo  25 units daily. - May have to wait until the new year to get Farxiga  if he gets a new plan - Discussed open enrollment and how to assess different plan options for drug pricing next year (co insurance vs copay for Tier 3 meds, drug deductible). Provided information to contact [employer] or use medicare.gov for guidance choosing a plan for next year.  Follow Up Plan: PRN  Darrelyn Drum, PharmD, BCPS, CPP Clinical Pharmacist Practitioner [facility] [phone]

## 2023-12-01 ENCOUNTER — Other Ambulatory Visit: Payer: Self-pay

## 2023-12-01 DIAGNOSIS — F900 Attention-deficit hyperactivity disorder, predominantly inattentive type: Secondary | ICD-10-CM

## 2023-12-01 MED ORDER — LISDEXAMFETAMINE DIMESYLATE 30 MG PO CAPS: 30.0000 mg | ORAL_CAPSULE | Freq: Every day | ORAL | 0 refills | Status: DC

## 2023-12-07 ENCOUNTER — Ambulatory Visit: Admitting: Gastroenterology

## 2023-12-07 ENCOUNTER — Other Ambulatory Visit

## 2023-12-07 ENCOUNTER — Encounter: Payer: Self-pay | Admitting: Gastroenterology

## 2023-12-07 VITALS — BP 100/42 | HR 62 | Ht 65.0 in | Wt 163.1 lb

## 2023-12-07 DIAGNOSIS — K7460 Unspecified cirrhosis of liver: Secondary | ICD-10-CM

## 2023-12-07 DIAGNOSIS — B182 Chronic viral hepatitis C: Secondary | ICD-10-CM

## 2023-12-07 DIAGNOSIS — I480 Paroxysmal atrial fibrillation: Secondary | ICD-10-CM

## 2023-12-07 NOTE — Progress Notes (Signed)
 Chief Complaint: Cirrhosis Primary GI MD: Sampson  HPI: Discussed the use of AI scribe software for clinical note transcription with the patient, who gave verbal consent to proceed.  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 The hepatitis C has since cleared. His last liver imaging was in 2015. He has been vaccinated against hepatitis A and B, though the exact dates are uncertain. He has undergone endoscopy and colonoscopy in the past, with the last colonoscopy being 01/2015, which was normal.  He experiences significant gas, which he describes as 'a lot' but not malodorous, and has been occurring for a couple of months. He also has diarrhea, particularly after consuming sweets, which he attributes to his diabetes.   He has a history of a severe tonsillectomy complication at age 27, where he received a large volume of blood transfusions, believed to be the source of his hepatitis C infection. He has never used IV drugs.  He is on a blood thinner for atrial fibrillation and has a history of diabetes. He manages his diabetes with medications, some of which he sources from [place] due to cost. He is considering changing his insurance plan to better cover his medications.  He has a family history of cirrhosis, with an uncle who was an alcoholic and died from the condition. He is concerned about his grandchildren's future in the current socio-political climate.   PREVIOUS GI WORKUP   Colonoscopy 01/2015 with Dr. Rollin Normal  Past Medical History:  Diagnosis Date   Anemia    rec'd 32 units of blood, post T&A, 1981   ATTENTION DEFICIT HYPERACTIVITY DISORDER 07/19/2006   BENIGN PROSTATIC HYPERTROPHY 07/19/2006   DIABETES MELLITUS, TYPE II 07/19/2006   Dyspnea    had shortness of breath when heart was irregular   Dysrhythmia    history of a-fib   ERECTILE DYSFUNCTION  07/19/2006   GERD 07/19/2006   HEPATITIS C, CHRONIC 04/03/2007   Treated with Harboni    History of blood transfusion    NEOPLASM, MALIGNANT, CARCINOMA, BASAL CELL, NOSE 10/20/2009   Basal cell    Past Surgical History:  Procedure Laterality Date   APPENDECTOMY     ATRIAL FIBRILLATION ABLATION N/A 03/27/2019   Procedure: ATRIAL FIBRILLATION ABLATION;  Surgeon: Inocencio Soyla Lunger, MD;  Location: [facility] INVASIVE CV LAB;  Service: Cardiovascular;  Laterality: N/A;   ATRIAL FIBRILLATION ABLATION N/A 06/19/2019   Procedure: ATRIAL FIBRILLATION ABLATION;  Surgeon: Inocencio Soyla Lunger, MD;  Location: [facility] INVASIVE CV LAB;  Service: Cardiovascular;  Laterality: N/A;   carotid injury       post tonsillectomy, R carotid injury with trach   HERNIA REPAIR  1973   inguinal- R side   KNEE SURGERY Right    LUMBAR LAMINECTOMY/DECOMPRESSION MICRODISCECTOMY Right 09/12/2012   Procedure: LUMBAR LAMINECTOMY/DECOMPRESSION MICRODISCECTOMY 1 LEVEL;  Surgeon: Victory DELENA Gunnels, MD;  Location: [facility] NEURO ORS;  Service: Neurosurgery;  Laterality: Right;  Right lumbar three-four microdiscectomy   ORIF SHOULDER FRACTURE Right 06/02/2017   Procedure: OPEN REDUCTION INTERNAL FIXATION (ORIF) RIGHT GLENOID FRACTURE, POSSIBLE LATARJET CORACOID TRANSFER;  Surgeon: Addie Cordella Hamilton, MD;  Location: [facility] OR;  Service: Orthopedics;  Laterality: Right;   TEE WITHOUT CARDIOVERSION N/A 06/19/2019   Procedure: TRANSESOPHAGEAL ECHOCARDIOGRAM (TEE);  Surgeon: Alveta Aleene PARAS, MD;  Location[facility] INVASIVE CV LAB;  Service: Cardiovascular;  Laterality: N/A;   TONSILLECTOMY     TRACHEOSTOMY  CLOSURE  1981   UMBILICAL HERNIA REPAIR N/A 12/08/2020   Procedure: HERNIA REPAIR UMBILICAL;  Surgeon: Kinsinger, Herlene Righter, MD;  Location: [facility] ORS;  Service: General;  Laterality: N/A;    Current Outpatient Medications  Medication Sig Dispense Refill   alfuzosin  (UROXATRAL ) 10 MG 24 hr tablet Take 1 tablet (10 mg total) by mouth daily with breakfast. Take 1 by  mouth daily with breakfast 90 tablet 1   atorvastatin  (LIPITOR) 10 MG tablet Take 1 tablet (10 mg total) by mouth daily. TAKE 1 TABLET(10 MG) BY MOUTH DAILY 90 tablet 0   Continuous Glucose Sensor (FREESTYLE LIBRE 3 PLUS SENSOR) MISC Change sensor every 15 days. 2 each 5   dapagliflozin  propanediol (FARXIGA ) 10 MG TABS tablet Take 1 tablet (10 mg total) by mouth daily before breakfast. 14 tablet 0   fenofibrate  (TRICOR ) 145 MG tablet Take 1 tablet (145 mg total) by mouth daily. 90 tablet 2   insulin  aspart (NOVOLOG  FLEXPEN) 100 UNIT/ML FlexPen Inject subcutaneously (just before each meal) 22-22-20 units     insulin  glargine, 1 Unit Dial , (TOUJEO  SOLOSTAR) 300 UNIT/ML Solostar Pen Inject 25 Units into the skin daily.     Insulin  Pen Needle (BD PEN NEEDLE MICRO U/F) 32G X 6 MM MISC Inject 1 Act into the skin daily. 200 each 3   lisdexamfetamine (VYVANSE ) 30 MG capsule Take 1 capsule (30 mg total) by mouth daily. 30 capsule 0   metFORMIN  (GLUCOPHAGE -XR) 750 MG 24 hr tablet Take 2 tablets (1,500 mg total) by mouth daily with breakfast. 180 tablet 1   metoprolol  succinate (TOPROL -XL) 25 MG 24 hr tablet Take 1 tablet (25 mg total) by mouth daily. 90 tablet 1   omeprazole  (PRILOSEC) 20 MG capsule Take 1 capsule (20 mg total) by mouth daily. 90 capsule 1   rivaroxaban  (XARELTO ) 20 MG TABS tablet TAKE 1 TABLET(20 MG) BY MOUTH DAILY WITH SUPPER 90 tablet 1   tadalafil  (CIALIS ) 20 MG tablet TAKE 1 TABLET BY MOUTH DAILY AS NEEDED FOR E.D. 10 tablet 5   No current facility-administered medications for this visit.    Allergies as of 12/07/2023 - Review Complete 12/07/2023  Allergen Reaction Noted   Cephalexin Anaphylaxis, Swelling, and Other (See Comments) 07/19/2006   Edarbi  [azilsartan] Diarrhea 08/20/2019    Family History  Problem Relation Age of Onset   COPD Mother    Heart attack Father    Diabetes Maternal Aunt    Cancer Paternal Grandmother        colon cancer   Diabetes Maternal Aunt      Social History   Socioeconomic History   Marital status: Married    Spouse name: Not on file   Number of children: 2   Years of education: Not on file   Highest education level: Bachelor's degree (e.g., BA, AB, BS)  Occupational History   Occupation: Educational psychologist: [employer]  Tobacco Use   Smoking status: Former    Current packs/day: 0.00    Types: Cigarettes    Start date: 1973    Quit date: 2007    Years since quitting: 18.8    Passive exposure: Past   Smokeless tobacco: Former    Types: Chew    Quit date: 02/15/2007  Vaping Use   Vaping status: Never Used  Substance and Sexual Activity   Alcohol use: Yes    Comment: 3 glasses of wine per week    Drug use: Yes    Types: Marijuana  Comment: gummies occasionally   Sexual activity: Yes    Partners: Male  Other Topics Concern   Not on file  Social History Narrative   Regular exercise-yes   Social Drivers of Health   Financial Resource Strain: High Risk (05/23/2023)   Overall Financial Resource Strain (CARDIA)    Difficulty of Paying Living Expenses: Hard  Food Insecurity: No Food Insecurity (05/23/2023)   Hunger Vital Sign    Worried About Running Out of Food in the Last Year: Never true    Ran Out of Food in the Last Year: Never true  Transportation Needs: No Transportation Needs (05/23/2023)   PRAPARE - Administrator, Civil Service (Medical): No    Lack of Transportation (Non-Medical): No  Physical Activity: Insufficiently Active (05/23/2023)   Exercise Vital Sign    Days of Exercise per Week: 3 days    Minutes of Exercise per Session: 30 min  Stress: Stress Concern Present (05/23/2023)   [employer] of Occupational Health - Occupational Stress Questionnaire    Feeling of Stress : Rather much  Social Connections: Unknown (05/23/2023)   Social Connection and Isolation Panel    Frequency of Communication with Friends and Family: More than three times a week    Frequency of  Social Gatherings with Friends and Family: Twice a week    Attends Religious Services: 1 to 4 times per year    Active Member of [employer] or Organizations: Patient declined    Attends Engineer, structural: Not on file    Marital Status: Married  Catering manager Violence: Not on file    Review of Systems:    Constitutional: No weight loss, fever, chills, weakness or fatigue HEENT: Eyes: No change in vision               Ears, Nose, Throat:  No change in hearing or congestion Skin: No rash or itching Cardiovascular: No chest pain, chest pressure or palpitations   Respiratory: No SOB or cough Gastrointestinal: See HPI and otherwise negative Genitourinary: No dysuria or change in urinary frequency Neurological: No headache, dizziness or syncope Musculoskeletal: No new muscle or joint pain Hematologic: No bleeding or bruising Psychiatric: No history of depression or anxiety    Physical Exam:  Vital signs: BP (!) 100/42   Pulse 62   Ht 5' 5 (1.651 m)   Wt 163 lb 2 oz (74 kg)   BMI 27.15 kg/m   Constitutional: NAD, alert and cooperative Head:  Normocephalic and atraumatic. Eyes:   PEERL, EOMI. No icterus. Conjunctiva pink. Respiratory: Respirations even and unlabored. Lungs clear to auscultation bilaterally.   No wheezes, crackles, or rhonchi.  Cardiovascular:  Regular rate and rhythm. No peripheral edema, cyanosis or pallor.  Gastrointestinal:  Soft, nondistended, nontender. No rebound or guarding. Normal bowel sounds. No appreciable masses or hepatomegaly. Rectal:  Declines Msk:  Symmetrical without gross deformities. Without edema, no deformity or joint abnormality.  Neurologic:  Alert and  oriented x4;  grossly normal neurologically.  Skin:   Dry and intact without significant lesions or rashes. Psychiatric: Oriented to person, place and time. Demonstrates good judgement and reason without abnormal affect or behaviors.   RELEVANT LABS AND IMAGING: CBC    Component  Value Date/Time   WBC 4.2 11/01/2023 1143   RBC 5.09 11/01/2023 1143   HGB 15.0 11/01/2023 1143   HGB 16.4 03/15/2019 1249   HCT 43.8 11/01/2023 1143   HCT 47.5 03/15/2019 1249   PLT 173.0 11/01/2023 1143  PLT 167 03/15/2019 1249   MCV 86.1 11/01/2023 1143   MCV 87 03/15/2019 1249   MCH 29.5 12/01/2020 0924   MCHC 34.2 11/01/2023 1143   RDW 13.6 11/01/2023 1143   RDW 12.3 03/15/2019 1249   LYMPHSABS 1.0 11/01/2023 1143   MONOABS 0.5 11/01/2023 1143   EOSABS 0.1 11/01/2023 1143   BASOSABS 0.0 11/01/2023 1143    CMP     Component Value Date/Time   NA 140 11/01/2023 1143   NA 143 03/15/2019 1249   K 4.5 11/01/2023 1143   CL 104 11/01/2023 1143   CO2 26 11/01/2023 1143   GLUCOSE 172 (H) 11/01/2023 1143   BUN 23 11/01/2023 1143   BUN 20 03/15/2019 1249   CREATININE 0.84 11/01/2023 1143   CALCIUM  10.1 11/01/2023 1143   PROT 6.6 11/01/2023 1143   ALBUMIN  4.7 11/01/2023 1143   AST 21 11/01/2023 1143   ALT 19 11/01/2023 1143   ALKPHOS 45 11/01/2023 1143   BILITOT 0.9 11/01/2023 1143   GFRNONAA >60 12/01/2020 0924   GFRAA >60 06/19/2019 1006     Assessment/Plan:   Compensated Cirrhosis with history of Hep C s/p treatment with Harvoni US  abdomen complete 10/2013 with cirrhosis, no lesions. Unknown last EGD. MELD 3.0 10/2023: 11. Nonimmune to hep A/B in 2020 but reports vaccination though unsure when. Rare alcohol use. No follow up for his cirrhosis since 2015. -- Update HCC screening with RUQ ultrasound and AFP tumor marker - Hepatitis serologic workup to evaluate for immunity to hep A/B and variants of HCV - Liver labs every 6 months - EGD to screen for varices - 2 g sodium diet - Educated patient on cirrhosis - Follow-up 6 months  Flatulence Patient reports excess gas over the last few months and reports diet high in FODMAPs.  No change in bowel habits unless he consumes sweets which tends to give him diarrhea - Low FODMAP diet - Avoid offending foods  Colon cancer  screening Colonoscopy 01/2015 with Dr. Rollin which was normal with a repeat of 10 years -- repeat 01/2015 (or sooner if symptoms arise)  Hepatitis C s/p treatment  Persistent A-fib On Xarelto .  History of ablation 2021. - Will hold Xarelto  2 days prior to endoscopic procedures - will instruct when and how to resume after procedure. Benefits and risks of procedure explained including risks of bleeding, perforation, infection, missed lesions, reactions to medications and possible need for hospitalization and surgery for complications. Additional rare but real risk of stroke or other vascular clotting events off Xarelto  also explained and need to seek urgent help if any signs of these problems occur. Will communicate by phone or EMR with patient's  prescribing provider to confirm that holding Xarelto  is reasonable in this case.    CAD Echocardiogram 11/2020, EF 60%  Martise Waddell Mollie RIGGERS [facility] 12/07/2023, 2:16 PM  Cc: Joshua Debby CROME, MD

## 2023-12-07 NOTE — Patient Instructions (Signed)
 Your provider has requested that you go to the basement level for lab work before leaving today. Press B on the elevator. The lab is located at the first door on the left as you exit the elevator.  You have been scheduled for an abdominal ultrasound at [facility] (1st floor of hospital) on 12/16/23 at 9:00am. Please arrive 30 minutes prior to your appointment for registration. Make certain not to have anything to eat or drink after midnight prior to your appointment. Should you need to reschedule your appointment, please contact radiology at [phone]. This test typically takes about 30 minutes to perform.  You have been scheduled for an endoscopy. Please follow written instructions given to you at your visit today.  If you use inhalers (even only as needed), please bring them with you on the day of your procedure.  If you take any of the following medications, they will need to be adjusted prior to your procedure:   DO NOT TAKE 7 DAYS PRIOR TO TEST- Trulicity  (dulaglutide ) Ozempic, Wegovy (semaglutide) Mounjaro (tirzepatide) Bydureon Bcise (exanatide extended release)  DO NOT TAKE 1 DAY PRIOR TO YOUR TEST Rybelsus (semaglutide) Adlyxin (lixisenatide) Victoza  (liraglutide ) Byetta (exanatide)  Due to recent changes in healthcare laws, you may see the results of your imaging and laboratory studies on MyChart before your provider has had a chance to review them.  We understand that in some cases there may be results that are confusing or concerning to you. Not all laboratory results come back in the same time frame and the provider may be waiting for multiple results in order to interpret others.  Please give us  48 hours in order for your provider to thoroughly review all the results before contacting the office for clarification of your results.   _______________________________________________________  If your blood pressure at your visit was 140/90 or greater, please contact  your primary care physician to follow up on this.  _______________________________________________________  If you are age 30 or older, your body mass index should be between 23-30. Your Body mass index is 27.15 kg/m. If this is out of the aforementioned range listed, please consider follow up with your Primary Care Provider.  If you are age 29 or younger, your body mass index should be between 19-25. Your Body mass index is 27.15 kg/m. If this is out of the aformentioned range listed, please consider follow up with your Primary Care Provider.   ________________________________________________________  The [facility] providers would like to encourage you to use MYCHART to communicate with providers for non-urgent requests or questions.  Due to long hold times on the telephone, sending your provider a message by [facility] may be a faster and more efficient way to get a response.  Please allow 48 business hours for a response.  Please remember that this is for non-urgent requests.  _______________________________________________________  [facility] is using a team-based approach to care.  Your team is made up of your doctor and two to three APPS. Our APPS (Nurse Practitioners and Physician Assistants) work with your physician to ensure care continuity for you. They are fully qualified to address your health concerns and develop a treatment plan. They communicate directly with your gastroenterologist to care for you. Seeing the Advanced Practice Practitioners on your physician's team can help you by facilitating care more promptly, often allowing for earlier appointments, access to diagnostic testing, procedures, and other specialty referrals.   ___________________________________________________________________________

## 2023-12-12 ENCOUNTER — Ambulatory Visit: Payer: Self-pay | Admitting: Gastroenterology

## 2023-12-13 ENCOUNTER — Encounter: Payer: Self-pay | Admitting: Internal Medicine

## 2023-12-13 ENCOUNTER — Ambulatory Visit (AMBULATORY_SURGERY_CENTER): Admitting: Internal Medicine

## 2023-12-13 VITALS — BP 114/66 | HR 51 | Temp 97.3°F | Resp 12 | Ht 65.0 in | Wt 163.0 lb

## 2023-12-13 DIAGNOSIS — K2289 Other specified disease of esophagus: Secondary | ICD-10-CM

## 2023-12-13 DIAGNOSIS — K7460 Unspecified cirrhosis of liver: Secondary | ICD-10-CM

## 2023-12-13 DIAGNOSIS — K31A0 Gastric intestinal metaplasia, unspecified: Secondary | ICD-10-CM | POA: Diagnosis not present

## 2023-12-13 DIAGNOSIS — K229 Disease of esophagus, unspecified: Secondary | ICD-10-CM | POA: Diagnosis not present

## 2023-12-13 DIAGNOSIS — K2970 Gastritis, unspecified, without bleeding: Secondary | ICD-10-CM

## 2023-12-13 DIAGNOSIS — E119 Type 2 diabetes mellitus without complications: Secondary | ICD-10-CM | POA: Diagnosis not present

## 2023-12-13 DIAGNOSIS — K3189 Other diseases of stomach and duodenum: Secondary | ICD-10-CM | POA: Diagnosis not present

## 2023-12-13 DIAGNOSIS — J449 Chronic obstructive pulmonary disease, unspecified: Secondary | ICD-10-CM | POA: Diagnosis not present

## 2023-12-13 DIAGNOSIS — K449 Diaphragmatic hernia without obstruction or gangrene: Secondary | ICD-10-CM

## 2023-12-13 DIAGNOSIS — K31A15 Gastric intestinal metaplasia without dysplasia, involving multiple sites: Secondary | ICD-10-CM

## 2023-12-13 DIAGNOSIS — K208 Other esophagitis without bleeding: Secondary | ICD-10-CM | POA: Diagnosis not present

## 2023-12-13 MED ORDER — SODIUM CHLORIDE 0.9 % IV SOLN: 500.0000 mL | INTRAVENOUS | Status: DC

## 2023-12-13 NOTE — Patient Instructions (Addendum)
 Resume previous diet. Continue present medications. Restart Xarelto  tomorrow at prior dose tomorrow. Refer to managing physician for further adjustment of therapy. Awaiting pathology results. Repeat upper endoscopy in 2 years for screening purposes. Handout provided on hiatal hernia.  YOU HAD AN ENDOSCOPIC PROCEDURE TODAY AT THE [facility]:   Refer to the procedure report that was given to you for any specific questions about what was found during the examination.  If the procedure report does not answer your questions, please call your gastroenterologist to clarify.  If you requested that your care partner not be given the details of your procedure findings, then the procedure report has been included in a sealed envelope for you to review at your convenience later.  YOU SHOULD EXPECT: Some feelings of bloating in the abdomen. Passage of more gas than usual.  Walking can help get rid of the air that was put into your GI tract during the procedure and reduce the bloating. If you had a lower endoscopy (such as a colonoscopy or flexible sigmoidoscopy) you may notice spotting of blood in your stool or on the toilet paper. If you underwent a bowel prep for your procedure, you may not have a normal bowel movement for a few days.  Please Note:  You might notice some irritation and congestion in your nose or some drainage.  This is from the oxygen used during your procedure.  There is no need for concern and it should clear up in a day or so.  SYMPTOMS TO REPORT IMMEDIATELY:  Following upper endoscopy (EGD)  Vomiting of blood or coffee ground material  New chest pain or pain under the shoulder blades  Painful or persistently difficult swallowing  New shortness of breath  Fever of 100F or higher  Black, tarry-looking stools  For urgent or emergent issues, a gastroenterologist can be reached at any hour by calling [phone]. Do not use MyChart messaging for urgent concerns.     DIET:  We do recommend a small meal at first, but then you may proceed to your regular diet.  Drink plenty of fluids but you should avoid alcoholic beverages for 24 hours.  ACTIVITY:  You should plan to take it easy for the rest of today and you should NOT DRIVE or use heavy machinery until tomorrow (because of the sedation medicines used during the test).    FOLLOW UP: Our staff will call the number listed on your records the next business day following your procedure.  We will call around 7:15- 8:00 am to check on you and address any questions or concerns that you may have regarding the information given to you following your procedure. If we do not reach you, we will leave a message.     If any biopsies were taken you will be contacted by phone or by letter within the next 1-3 weeks.  Please call us  at [phone] if you have not heard about the biopsies in 3 weeks.    SIGNATURES/CONFIDENTIALITY: You and/or your care partner have signed paperwork which will be entered into your electronic medical record.  These signatures attest to the fact that that the information above on your After Visit Summary has been reviewed and is understood.  Full responsibility of the confidentiality of this discharge information lies with you and/or your care-partner.

## 2023-12-13 NOTE — Progress Notes (Signed)
 See office note dated 12/07/2023 for details and current H&P  Patient presenting for upper endoscopy for variceal screening in the setting of compensated cirrhosis with a history of hepatitis C.  Xarelto  has been on hold x 2 days  He remains appropriate for [facility] upper endoscopy today.

## 2023-12-13 NOTE — Op Note (Addendum)
 [facility] Patient Name: Michael Li Procedure Date: 12/13/2023 11:22 AM MRN: [id number] Endoscopist: Gordy CHRISTELLA Starch , MD, [id number] Age: 68 Referring MD:  Date of Birth: Aug 08, 1955 Gender: Male Account #: [id number] Procedure:                Upper GI endoscopy Indications:              Cirrhosis rule out esophageal varices Medicines:                Monitored Anesthesia Care Procedure:                Pre-Anesthesia Assessment:                           - Prior to the procedure, a History and Physical                            was performed, and patient medications and                            allergies were reviewed. The patient's tolerance of                            previous anesthesia was also reviewed. The risks                            and benefits of the procedure and the sedation                            options and risks were discussed with the patient.                            All questions were answered, and informed consent                            was obtained. Prior Anticoagulants: The patient has                            taken Xarelto  (rivaroxaban ), last dose was 2 days                            prior to procedure. ASA Grade Assessment: III - A                            patient with severe systemic disease. After                            reviewing the risks and benefits, the patient was                            deemed in satisfactory condition to undergo the                            procedure.  After obtaining informed consent, the endoscope was                            passed under direct vision. Throughout the                            procedure, the patient's blood pressure, pulse, and                            oxygen saturations were monitored continuously. The                            Olympus Scope SN [id number] was introduced through the                            mouth, and advanced to the second part  of duodenum.                            The upper GI endoscopy was accomplished without                            difficulty. The patient tolerated the procedure                            well. Scope In: Scope Out: Findings:                 The Z-line was irregular and was found 36 cm from                            the incisors. Several biopsies were obtained with                            cold forceps for evaluation to rule out Barrett's                            Esophagus in a targeted manner in the distal                            esophagus.                           A 3 cm hiatal hernia was present.                           There is no endoscopic evidence of varices in the                            entire esophagus.                           Scattered mild inflammation characterized by                            erosions, erythema and granularity was found in the  gastric fundus and in the gastric antrum. Biopsies                            were taken with a cold forceps for Helicobacter                            pylori testing.                           The examined duodenum was normal. Complications:            No immediate complications. Estimated Blood Loss:     Estimated blood loss was minimal. Impression:               - Z-line irregular, 36 cm from the incisors.                            Biopsied to exclude Barrett's esophagus (if                            present, short-segment, 2 tongues 1-1.5 cm in                            length)                           - 3 cm hiatal hernia.                           - Mild and scattered gastritis. Biopsied to exclude                            H. Pylori.                           - Normal examined duodenum.                           - No varices. Recommendation:           - Patient has a contact number available for                            emergencies. The signs and symptoms of potential                             delayed complications were discussed with the                            patient. Return to normal activities tomorrow.                            Written discharge instructions were provided to the                            patient.                           -  Resume previous diet.                           - Resume Xarelto  (rivaroxaban ) at prior dose                            tomorrow. Refer to managing physician for further                            adjustment of therapy.                           - Continue present medications.                           - Await pathology results.                           - Repeat upper endoscopy in 2 years for screening                            purposes. Gordy CHRISTELLA Starch, MD 12/13/2023 11:43:44 AM This report has been signed electronically.

## 2023-12-13 NOTE — Progress Notes (Signed)
 Report to PACU, RN, vss, BBS= Clear.

## 2023-12-13 NOTE — Progress Notes (Signed)
 Called to room to assist during endoscopic procedure.  Patient ID and intended procedure confirmed with present staff. Received instructions for my participation in the procedure from the performing physician.

## 2023-12-14 ENCOUNTER — Telehealth: Payer: Self-pay

## 2023-12-14 NOTE — Telephone Encounter (Signed)
  Follow up Call-     12/13/2023   11:11 AM  Call back number  Post procedure Call Back phone  # [phone]  Permission to leave phone message Yes     Patient questions:  Do you have a fever, pain , or abdominal swelling? No. Pain Score  0 *  Have you tolerated food without any problems? Yes.    Have you been able to return to your normal activities? Yes.    Do you have any questions about your discharge instructions: Diet   No. Medications  No. Follow up visit  No.  Do you have questions or concerns about your Care? No.  Actions: * If pain score is 4 or above: No action needed, pain <4.

## 2023-12-15 LAB — SURGICAL PATHOLOGY

## 2023-12-16 ENCOUNTER — Ambulatory Visit (HOSPITAL_COMMUNITY)
Admission: RE | Admit: 2023-12-16 | Discharge: 2023-12-16 | Disposition: A | Discharge status: Home / Self Care | Source: Ambulatory Visit | Attending: Gastroenterology | Admitting: Gastroenterology

## 2023-12-16 DIAGNOSIS — K7460 Unspecified cirrhosis of liver: Secondary | ICD-10-CM | POA: Diagnosis present

## 2023-12-16 DIAGNOSIS — K8020 Calculus of gallbladder without cholecystitis without obstruction: Secondary | ICD-10-CM | POA: Diagnosis not present

## 2023-12-16 DIAGNOSIS — K7689 Other specified diseases of liver: Secondary | ICD-10-CM | POA: Diagnosis not present

## 2023-12-16 LAB — AFP TUMOR MARKER: AFP-Tumor Marker: 2.3 ng/mL (ref <6.1)

## 2023-12-16 LAB — HCV VIRAL RNA GEN3 NS5A DRUG RESIST: HCV NS5a Subtype: NOT DETECTED

## 2023-12-16 LAB — HEPATITIS A ANTIBODY, TOTAL: Hepatitis A AB,Total: REACTIVE — AB

## 2023-12-16 LAB — HEPATITIS B SURFACE ANTIBODY,QUALITATIVE: Hep B S Ab: REACTIVE — AB

## 2023-12-16 LAB — HEPATITIS B SURFACE ANTIGEN: Hepatitis B Surface Ag: NONREACTIVE

## 2023-12-20 ENCOUNTER — Ambulatory Visit: Payer: Self-pay | Admitting: Internal Medicine

## 2023-12-20 DIAGNOSIS — K2270 Barrett's esophagus without dysplasia: Secondary | ICD-10-CM | POA: Insufficient documentation

## 2024-01-03 ENCOUNTER — Telehealth: Payer: Self-pay

## 2024-01-03 NOTE — Telephone Encounter (Signed)
 Copied from CRM [id number]. Topic: General - Other >> Jan 03, 2024 12:00 PM Rosina BIRCH wrote: Reason for CRM: christine from [facility] called needing to verify the rivaroxaban  tablet for the patient. Wanda want to know if they can dispense 100 tablets instead of what is written on the prescription which is 90

## 2024-01-05 NOTE — Telephone Encounter (Signed)
 RX has been verified.

## 2024-01-09 ENCOUNTER — Encounter: Payer: Self-pay | Admitting: Internal Medicine

## 2024-01-10 ENCOUNTER — Other Ambulatory Visit: Payer: Self-pay

## 2024-01-10 DIAGNOSIS — F900 Attention-deficit hyperactivity disorder, predominantly inattentive type: Secondary | ICD-10-CM

## 2024-01-10 MED ORDER — LISDEXAMFETAMINE DIMESYLATE 30 MG PO CAPS: 30.0000 mg | ORAL_CAPSULE | Freq: Every day | ORAL | 0 refills | Status: DC

## 2024-01-31 ENCOUNTER — Encounter: Payer: Self-pay | Admitting: Internal Medicine

## 2024-02-05 ENCOUNTER — Other Ambulatory Visit: Payer: Self-pay | Admitting: Internal Medicine

## 2024-02-05 ENCOUNTER — Telehealth: Payer: Self-pay | Admitting: Internal Medicine

## 2024-02-05 ENCOUNTER — Telehealth: Payer: Self-pay

## 2024-02-05 DIAGNOSIS — I2510 Atherosclerotic heart disease of native coronary artery without angina pectoris: Secondary | ICD-10-CM

## 2024-02-05 DIAGNOSIS — E1159 Type 2 diabetes mellitus with other circulatory complications: Secondary | ICD-10-CM

## 2024-02-05 DIAGNOSIS — K219 Gastro-esophageal reflux disease without esophagitis: Secondary | ICD-10-CM

## 2024-02-05 DIAGNOSIS — E785 Hyperlipidemia, unspecified: Secondary | ICD-10-CM

## 2024-02-05 DIAGNOSIS — E119 Type 2 diabetes mellitus without complications: Secondary | ICD-10-CM

## 2024-02-05 DIAGNOSIS — N401 Enlarged prostate with lower urinary tract symptoms: Secondary | ICD-10-CM

## 2024-02-05 DIAGNOSIS — F900 Attention-deficit hyperactivity disorder, predominantly inattentive type: Secondary | ICD-10-CM

## 2024-02-05 DIAGNOSIS — R931 Abnormal findings on diagnostic imaging of heart and coronary circulation: Secondary | ICD-10-CM

## 2024-02-05 MED ORDER — OMEPRAZOLE 20 MG PO CPDR: 20.0000 mg | DELAYED_RELEASE_CAPSULE | Freq: Every day | ORAL | 0 refills | Status: AC

## 2024-02-05 MED ORDER — METFORMIN HCL ER 750 MG PO TB24: 1500.0000 mg | ORAL_TABLET | Freq: Every day | ORAL | 0 refills | Status: AC

## 2024-02-05 MED ORDER — LISDEXAMFETAMINE DIMESYLATE 30 MG PO CAPS: 30.0000 mg | ORAL_CAPSULE | Freq: Every day | ORAL | 0 refills | Status: DC

## 2024-02-05 MED ORDER — ALFUZOSIN HCL ER 10 MG PO TB24: 10.0000 mg | ORAL_TABLET | Freq: Every day | ORAL | 0 refills | Status: AC

## 2024-02-05 MED ORDER — ATORVASTATIN CALCIUM 10 MG PO TABS: 10.0000 mg | ORAL_TABLET | Freq: Every day | ORAL | 0 refills | Status: AC

## 2024-02-05 MED ORDER — DAPAGLIFLOZIN PROPANEDIOL 10 MG PO TABS: 10.0000 mg | ORAL_TABLET | Freq: Every day | ORAL | 0 refills | Status: AC

## 2024-02-05 MED ORDER — FENOFIBRATE 145 MG PO TABS: 145.0000 mg | ORAL_TABLET | Freq: Every day | ORAL | 0 refills | Status: AC

## 2024-02-05 NOTE — Telephone Encounter (Signed)
 Copied from CRM [id number]. Topic: Clinical - Medication Question >> Feb 05, 2024 12:06 PM Alfonso HERO wrote: Reason for CRM: patient called again about his refills. I sent a refill request over to Dr. Joshua. I told him that you haven't gotten a chance to see his message yet but that I will let you know he did call.

## 2024-02-05 NOTE — Telephone Encounter (Unsigned)
 Copied from CRM #[id number]. Topic: Clinical - Medication Refill >> Feb 05, 2024 12:05 PM Alfonso HERO wrote: Medication:  Alfuzosin  Hcl Er  Denigrate 145 Metformin  HclEr 750 mg Omeprazole  Lisdexamfetamine  Farxiga    Has the patient contacted their pharmacy? Yes (Agent: If no, request that the patient contact the pharmacy for the refill. If patient does not wish to contact the pharmacy document the reason why and proceed with request.) (Agent: If yes, when and what did the pharmacy advise?)  This is the patient's preferred pharmacy:  [id number] - [place], [place] - [address] AT [facility] [address] [place] [address] Phone: [phone] Fax: [phone]  Is this the correct pharmacy for this prescription? Yes If no, delete pharmacy and type the correct one.   Has the prescription been filled recently? Yes  Is the patient out of the medication? Yes  Has the patient been seen for an appointment in the last year OR does the patient have an upcoming appointment? Yes  Can we respond through MyChart? Yes  Agent: Please be advised that Rx refills may take up to 3 business days. We ask that you follow-up with your pharmacy.

## 2024-02-20 ENCOUNTER — Encounter: Payer: Self-pay | Admitting: Internal Medicine

## 2024-02-21 ENCOUNTER — Other Ambulatory Visit: Payer: Self-pay

## 2024-02-21 DIAGNOSIS — F900 Attention-deficit hyperactivity disorder, predominantly inattentive type: Secondary | ICD-10-CM

## 2024-02-21 MED ORDER — LISDEXAMFETAMINE DIMESYLATE 30 MG PO CAPS: 30.0000 mg | ORAL_CAPSULE | Freq: Every day | ORAL | 0 refills | Status: AC

## 2024-02-29 ENCOUNTER — Telehealth: Payer: Self-pay

## 2024-02-29 ENCOUNTER — Ambulatory Visit (INDEPENDENT_AMBULATORY_CARE_PROVIDER_SITE_OTHER)

## 2024-02-29 VITALS — BP 116/60 | HR 62 | Temp 97.6°F | Ht 67.0 in | Wt 173.6 lb

## 2024-02-29 DIAGNOSIS — Z0000 Encounter for general adult medical examination without abnormal findings: Secondary | ICD-10-CM | POA: Diagnosis not present

## 2024-02-29 NOTE — Progress Notes (Signed)
 "  Chief Complaint  Patient presents with   Medicare Wellness     Subjective:   Michael Li is a 69 y.o. male who presents for a Medicare Annual Wellness Visit.  Visit info / Clinical Intake: Medicare Wellness Visit Type:: Subsequent Annual Wellness Visit Persons participating in visit and providing information:: patient Interpreter Needed?: No Pre-visit prep was completed: yes AWV questionnaire completed by patient prior to visit?: no Living arrangements:: lives with spouse/significant other Patient's Overall Health Status Rating: very good Typical amount of pain: some Does pain affect daily life?: no Are you currently prescribed opioids?: no  Dietary Habits and Nutritional Risks How many meals a day?: 3 Eats fruit and vegetables daily?: yes Most meals are obtained by: preparing own meals In the last 2 weeks, have you had any of the following?: none Diabetic:: (!) yes Any non-healing wounds?: no How often do you check your BS?: 0 Would you like to be referred to a Nutritionist or for Diabetic Management? : no  Functional Status Activities of Daily Living (to include ambulation/medication): Independent Ambulation: Independent Medication Administration: Independent Home Management (perform basic housework or laundry): Independent Manage your own finances?: yes Primary transportation is: driving Concerns about vision?: no *vision screening is required for WTM* Concerns about hearing?: no  Fall Screening Falls in the past year?: 0 Number of falls in past year: 0 Was there an injury with Fall?: 0 Fall Risk Category Calculator: 0 Patient Fall Risk Level: Low Fall Risk  Fall Risk Patient at Risk for Falls Due to: Medication side effect Fall risk Follow up: Falls prevention discussed; Falls evaluation completed  Home and Transportation Safety: All rugs have non-skid backing?: yes All stairs or steps have railings?: yes Grab bars in the bathtub or shower?: (!)  no Have non-skid surface in bathtub or shower?: yes Good home lighting?: yes Regular seat belt use?: yes Hospital stays in the last year:: no  Cognitive Assessment Difficulty concentrating, remembering, or making decisions? : yes Will 6CIT or Mini Cog be Completed: yes What year is it?: 0 points What month is it?: 0 points Give patient an address phrase to remember (5 components): [address] [place] About what time is it?: 0 points Count backwards from 20 to 1: 0 points Say the months of the year in reverse: 0 points Repeat the address phrase from earlier: 0 points 6 CIT Score: 0 points  Advance Directives (For Healthcare) Does Patient Have a Medical Advance Directive?: Yes Does patient want to make changes to medical advance directive?: No - Patient declined Type of Advance Directive: Healthcare Power of [place]; Living will Copy of Healthcare Power of Attorney in Chart?: No - copy requested Copy of Living Will in Chart?: No - copy requested  Reviewed/Updated  Reviewed/Updated: Reviewed All (Medical, Surgical, Family, Medications, Allergies, Care Teams, Patient Goals)    Allergies (verified) Cephalexin and Edarbi  [azilsartan]   Current Medications (verified) Outpatient Encounter Medications as of 02/29/2024  Medication Sig   alfuzosin  (UROXATRAL ) 10 MG 24 hr tablet Take 1 tablet (10 mg total) by mouth daily with breakfast. Take 1 by mouth daily with breakfast   atorvastatin  (LIPITOR) 10 MG tablet Take 1 tablet (10 mg total) by mouth daily. TAKE 1 TABLET(10 MG) BY MOUTH DAILY   dapagliflozin  propanediol (FARXIGA ) 10 MG TABS tablet Take 1 tablet (10 mg total) by mouth daily before breakfast.   fenofibrate  (TRICOR ) 145 MG tablet Take 1 tablet (145 mg total) by mouth daily.   insulin  glargine,  1 Unit Dial , (TOUJEO  SOLOSTAR) 300 UNIT/ML Solostar Pen Inject 25 Units into the skin daily.   Insulin  Pen Needle (BD PEN NEEDLE MICRO U/F) 32G X 6 MM MISC Inject 1 Act into the skin  daily.   metFORMIN  (GLUCOPHAGE -XR) 750 MG 24 hr tablet Take 2 tablets (1,500 mg total) by mouth daily with breakfast.   metoprolol  succinate (TOPROL -XL) 25 MG 24 hr tablet Take 1 tablet (25 mg total) by mouth daily.   omeprazole  (PRILOSEC) 20 MG capsule Take 1 capsule (20 mg total) by mouth daily.   rivaroxaban  (XARELTO ) 20 MG TABS tablet TAKE 1 TABLET(20 MG) BY MOUTH DAILY WITH SUPPER   tadalafil  (CIALIS ) 20 MG tablet TAKE 1 TABLET BY MOUTH DAILY AS NEEDED FOR E.D.   Continuous Glucose Sensor (FREESTYLE LIBRE 3 PLUS SENSOR) MISC Change sensor every 15 days. (Patient not taking: Reported on 02/29/2024)   insulin  aspart (NOVOLOG  FLEXPEN) 100 UNIT/ML FlexPen Inject subcutaneously (just before each meal) 22-22-20 units (Patient not taking: Reported on 12/13/2023)   lisdexamfetamine  (VYVANSE ) 30 MG capsule Take 1 capsule (30 mg total) by mouth daily. (Patient not taking: Reported on 02/29/2024)   No facility-administered encounter medications on file as of 02/29/2024.    History: 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   Basal cell   Past Surgical History:  Procedure Laterality Date   APPENDECTOMY     ATRIAL FIBRILLATION ABLATION N/A 03/27/2019   Procedure: ATRIAL FIBRILLATION ABLATION;  Surgeon: Inocencio Soyla Lunger, MD;  Location: [facility] IN[facility]ASIVE CV [facility];  Service: Cardiovascular;  Laterality: N/A;   ATRIAL FIBRILLATION ABLATION N/A 06/19/2019   Procedure: ATRIAL FIBRILLATION ABLATION;  Surgeon: Inocencio Soyla Lunger, MD;   Location: [facility]AS[facility] CV [facility];  Service: Cardiovascular;  Laterality: N/A;   carotid injury       post tonsillectomy, R carotid injury with trach   HERNIA REPAIR  1973   inguinal- R side   KNEE SURGERY Right    LUMBAR LAMINECTOMY/DECOMPRESSION MICRODISCECTOMY Right 09/12/2012   Procedure: LUMBAR LAMINECTOMY/DECOMPRESSION MICRODISCECTOMY 1 LEVEL;  Surgeon: Victory DELENA Gunnels, MD;  Location: [facility]URO OR[facility];  Service: Neurosurgery;  Laterality: Right;  Right lumbar three-four microdiscectomy   ORIF SHOULDER FRACTURE Right 06/02/2017   Procedure: OPEN REDUCTION INTERNAL FIXATION (ORIF) RIGHT GLENOID FRACTURE, POSSIBLE LATARJET CORACOID TRANSFER;  Surgeon: Addie Cordella Hamilton, MD;  Location: [facility] OR;  Service: Orthopedics;  Laterality: Right;   TEE WITHOUT CARDIOVERSION N/A 06/19/2019   Procedure: TRANSESOPHAGEAL ECHOCARDIOGRAM (TEE);  Surgeon: Alveta Aleene PARAS, MD;  Location: [facility] INVASIVE CV LAB;  Service: Cardiovascular;  Laterality: N/A;   TONSILLECTOMY     TRACHEOSTOMY CLOSURE  1981   UMBILICAL HERNIA REPAIR N/A 12/08/2020   Procedure: HERNIA REPAIR UMBILICAL;  Surgeon: Kinsinger, Herlene Righter, MD;  Location: [facility] ORS;  Service: General;  Laterality: N/A;   Family History  Problem Relation Age of Onset   COPD Mother    Heart attack Father    Diabetes Maternal Aunt  Diabetes Maternal Aunt    Colon cancer Paternal Grandmother    Cancer Paternal Grandmother        colon cancer   Social History   Occupational History   Occupation: Educational Psychologist: [employer]  Tobacco Use   Smoking status: Former    Current packs/day: 0.00    Types: Cigarettes    Start date: 1973    Quit date: 2007    Years since quitting: 19.0    Passive exposure: Past   Smokeless tobacco: Former    Types: Chew    Quit date: 02/15/2007  Vaping Use   Vaping status: Never Used  Substance and Sexual Activity   Alcohol use: Yes    Comment: 3 glasses of wine per week    Drug use: Not Currently    Types:  Marijuana    Comment: gummies occasionally   Sexual activity: Yes    Partners: Male   Tobacco Counseling Counseling given: Not Answered  SDOH Screenings   Food Insecurity: No Food Insecurity (02/29/2024)  Housing: Unknown (02/29/2024)  Transportation Needs: No Transportation Needs (02/29/2024)  Utilities: Not At Risk (02/29/2024)  Alcohol Screen: Low Risk (02/29/2024)  Depression (PHQ2-9): Low Risk (02/29/2024)  Financial Resource Strain: Low Risk (02/29/2024)  Physical Activity: Insufficiently Active (02/29/2024)  Social Connections: Moderately Isolated (02/29/2024)  Stress: No Stress Concern Present (02/29/2024)  Tobacco Use: Medium Risk (02/29/2024)  Health Literacy: Adequate Health Literacy (02/29/2024)   See flowsheets for full screening details  Depression Screen PHQ 2 & 9 Depression Scale- Over the past 2 weeks, how often have you been bothered by any of the following problems? Little interest or pleasure in doing things: 0 Feeling down, depressed, or hopeless (PHQ Adolescent also includes...irritable): 0 PHQ-2 Total Score: 0 Trouble falling or staying asleep, or sleeping too much: 0 Feeling tired or having little energy: 0 Poor appetite or overeating (PHQ Adolescent also includes...weight loss): 0 Feeling bad about yourself - or that you are a failure or have let yourself or your family down: 0 Trouble concentrating on things, such as reading the newspaper or watching television (PHQ Adolescent also includes...like school work): 0 Moving or speaking so slowly that other people could have noticed. Or the opposite - being so fidgety or restless that you have been moving around a lot more than usual: 0 Thoughts that you would be better off dead, or of hurting yourself in some way: 0 PHQ-9 Total Score: 0 If you checked off any problems, how difficult have these problems made it for you to do your work, take care of things at home, or get along with other people?: Not difficult at  all  Depression Treatment Depression Interventions/Treatment : EYV7-0 Score <4 Follow-up Not Indicated     Goals Addressed             This Visit's Progress    Exercise 150 min/wk Moderate Activity               Objective:    Today's Vitals   02/29/24 1458  BP: 116/60  Pulse: 62  Temp: 97.6 F (36.4 C)  TempSrc: Oral  SpO2: 94%  Weight: 173 lb 9.6 oz (78.7 kg)  Height: 5' 7 (1.702 m)   Body mass index is 27.19 kg/m.  Hearing/Vision screen Vision Screening - Comments:: Due for eye exam Immunizations and Health Maintenance Health Maintenance  Topic Date Due   OPHTHALMOLOGY EXAM  02/16/2023   HEMOGLOBIN A1C  04/30/2024   Diabetic  kidney evaluation - Urine ACR  05/24/2024   FOOT EXAM  05/24/2024   Diabetic kidney evaluation - eGFR measurement  10/31/2024   Colonoscopy  02/09/2025   Medicare Annual Wellness (AWV)  02/28/2025   DTaP/Tdap/Td (3 - Td or Tdap) 07/30/2029   Pneumococcal Vaccine: 50+ Years  Completed   Influenza Vaccine  Completed   Hepatitis C Screening  Completed   Zoster Vaccines- Shingrix  Completed   Meningococcal B Vaccine  Aged Out   Hepatitis B Vaccines 19-59 Average Risk  Discontinued   COVID-19 Vaccine  Discontinued        Assessment/Plan:  This is a routine wellness examination for Michael Li.  Patient Care Team: Joshua Debby CROME, MD as PCP - General (Internal Medicine) Inocencio Soyla Lunger, MD as PCP - Electrophysiology (Cardiology) Michele Richardson, DO as PCP - Cardiology (Cardiology) Merceda Lela SAUNDERS, RPH-CPP as Pharmacist (Pharmacist)  I have personally reviewed and noted the following in the patients chart:   Medical and social history Use of alcohol, tobacco or illicit drugs  Current medications and supplements including opioid prescriptions. Functional ability and status Nutritional status Physical activity Advanced directives List of other physicians Hospitalizations, surgeries, and ER visits in previous 12  months Vitals Screenings to include cognitive, depression, and falls Referrals and appointments  No orders of the defined types were placed in this encounter.  In addition, I have reviewed and discussed with patient certain preventive protocols, quality metrics, and best practice recommendations. A written personalized care plan for preventive services as well as general preventive health recommendations were provided to patient.   Ardella FORBES Dawn, LPN   8/84/7973   Return in 1 year (on 02/28/2025).  After Visit Summary: (In Person-Declined) Patient declined AVS at this time.  Nurse Notes: HM Addressed: Patient states he will make an eye appointment. States he has not taken Vyvanse  in the last week. States he does not notice any difference.  "

## 2024-02-29 NOTE — Patient Instructions (Signed)
 Michael Li,  Thank you for taking the time for your Medicare Wellness Visit. I appreciate your continued commitment to your health goals. Please review the care plan we discussed, and feel free to reach out if I can assist you further.  Please note that Annual Wellness Visits do not include a physical exam. Some assessments may be limited, especially if the visit was conducted virtually. If needed, we may recommend an in-person follow-up with your provider.  Ongoing Care Seeing your primary care provider every 3 to 6 months helps us  monitor your health and provide consistent, personalized care.   Referrals If a referral was made during today's visit and you haven't received any updates within two weeks, please contact the referred provider directly to check on the status.  Recommended Screenings:  Health Maintenance  Topic Date Due   Medicare Annual Wellness Visit  Never done   Eye exam for diabetics  02/16/2023   Hemoglobin A1C  04/30/2024   Kidney health urinalysis for diabetes  05/24/2024   Complete foot exam   05/24/2024   Yearly kidney function blood test for diabetes  10/31/2024   Colon Cancer Screening  02/09/2025   DTaP/Tdap/Td vaccine (3 - Td or Tdap) 07/30/2029   Pneumococcal Vaccine for age over 43  Completed   Flu Shot  Completed   Hepatitis C Screening  Completed   Zoster (Shingles) Vaccine  Completed   Meningitis B Vaccine  Aged Out   Hepatitis B Vaccine  Discontinued   COVID-19 Vaccine  Discontinued       02/29/2024    3:07 PM  Advanced Directives  Does Patient Have a Medical Advance Directive? Yes  Type of Estate Agent of [place];Living will  Does patient want to make changes to medical advance directive? No - Patient declined  Copy of Healthcare Power of Attorney in Chart? No - copy requested    Vision: Annual vision screenings are recommended for early detection of glaucoma, cataracts, and diabetic retinopathy. These exams can also  reveal signs of chronic conditions such as diabetes and high blood pressure.  Dental: Annual dental screenings help detect early signs of oral cancer, gum disease, and other conditions linked to overall health, including heart disease and diabetes.  Please see the attached documents for additional preventive care recommendations.

## 2024-02-29 NOTE — Telephone Encounter (Signed)
 Patient came in the office today and had a few questions..   He wants know can we send in DEXCOM for him to use as he is having issues with freestyle libre.  Can you explain the differences between adderrall instead of the vyvanse .

## 2024-03-01 ENCOUNTER — Other Ambulatory Visit: Payer: Self-pay | Admitting: Internal Medicine

## 2024-03-01 DIAGNOSIS — E119 Type 2 diabetes mellitus without complications: Secondary | ICD-10-CM

## 2024-03-01 MED ORDER — DEXCOM G7 RECEIVER DEVI: 1.0000 | Freq: Every day | 1 refills | Status: DC

## 2024-03-01 MED ORDER — DEXCOM G7 SENSOR MISC: 1.0000 | Freq: Every day | 1 refills | Status: DC

## 2024-03-01 NOTE — Progress Notes (Signed)
 "  Chief Complaint  Patient presents with   Medicare Wellness     Subjective:   Michael Li is a 69 y.o. male who presents for a Medicare Annual Wellness Visit.  Visit info / Clinical Intake: Medicare Wellness Visit Type:: Subsequent Annual Wellness Visit Persons participating in visit and providing information:: patient Medicare Wellness Visit Mode:: In-person (required for WTM) Interpreter Needed?: No Pre-visit prep was completed: yes AWV questionnaire completed by patient prior to visit?: no Living arrangements:: lives with spouse/significant other Patient's Overall Health Status Rating: very good Typical amount of pain: some Does pain affect daily life?: no Are you currently prescribed opioids?: no  Dietary Habits and Nutritional Risks How many meals a day?: 3 Eats fruit and vegetables daily?: yes Most meals are obtained by: preparing own meals In the last 2 weeks, have you had any of the following?: none Diabetic:: (!) yes Any non-healing wounds?: no How often do you check your BS?: 0 Would you like to be referred to a Nutritionist or for Diabetic Management? : no  Functional Status Activities of Daily Living (to include ambulation/medication): Independent Ambulation: Independent Medication Administration: Independent Home Management (perform basic housework or laundry): Independent Manage your own finances?: yes Primary transportation is: driving Concerns about vision?: no *vision screening is required for WTM* Concerns about hearing?: no  Fall Screening Falls in the past year?: 0 Number of falls in past year: 0 Was there an injury with Fall?: 0 Fall Risk Category Calculator: 0 Patient Fall Risk Level: Low Fall Risk  Fall Risk Patient at Risk for Falls Due to: Medication side effect Fall risk Follow up: Falls prevention discussed; Falls evaluation completed  Home and Transportation Safety: All rugs have non-skid backing?: yes All stairs or steps have  railings?: yes Grab bars in the bathtub or shower?: (!) no Have non-skid surface in bathtub or shower?: yes Good home lighting?: yes Regular seat belt use?: yes Hospital stays in the last year:: no  Cognitive Assessment Difficulty concentrating, remembering, or making decisions? : yes Will 6CIT or Mini Cog be Completed: yes What year is it?: 0 points What month is it?: 0 points Give patient an address phrase to remember (5 components): [address] [place] About what time is it?: 0 points Count backwards from 20 to 1: 0 points Say the months of the year in reverse: 0 points Repeat the address phrase from earlier: 0 points 6 CIT Score: 0 points  Advance Directives (For Healthcare) Does Patient Have a Medical Advance Directive?: Yes Does patient want to make changes to medical advance directive?: No - Patient declined Type of Advance Directive: Healthcare Power of [place]; Living will Copy of Healthcare Power of Attorney in Chart?: No - copy requested Copy of Living Will in Chart?: No - copy requested  Reviewed/Updated  Reviewed/Updated: Reviewed All (Medical, Surgical, Family, Medications, Allergies, Care Teams, Patient Goals)    Allergies (verified) Cephalexin and Edarbi  [azilsartan]   Current Medications (verified) Outpatient Encounter Medications as of 02/29/2024  Medication Sig   alfuzosin  (UROXATRAL ) 10 MG 24 hr tablet Take 1 tablet (10 mg total) by mouth daily with breakfast. Take 1 by mouth daily with breakfast   atorvastatin  (LIPITOR) 10 MG tablet Take 1 tablet (10 mg total) by mouth daily. TAKE 1 TABLET(10 MG) BY MOUTH DAILY   dapagliflozin  propanediol (FARXIGA ) 10 MG TABS tablet Take 1 tablet (10 mg total) by mouth daily before breakfast.   fenofibrate  (TRICOR ) 145 MG tablet Take 1 tablet (145 mg  total) by mouth daily.   insulin  glargine, 1 Unit Dial , (TOUJEO  SOLOSTAR) 300 UNIT/ML Solostar Pen Inject 25 Units into the skin daily.   Insulin  Pen Needle (BD PEN  NEEDLE MICRO U/F) 32G X 6 MM MISC Inject 1 Act into the skin daily.   metFORMIN  (GLUCOPHAGE -XR) 750 MG 24 hr tablet Take 2 tablets (1,500 mg total) by mouth daily with breakfast.   metoprolol  succinate (TOPROL -XL) 25 MG 24 hr tablet Take 1 tablet (25 mg total) by mouth daily.   omeprazole  (PRILOSEC) 20 MG capsule Take 1 capsule (20 mg total) by mouth daily.   rivaroxaban  (XARELTO ) 20 MG TABS tablet TAKE 1 TABLET(20 MG) BY MOUTH DAILY WITH SUPPER   tadalafil  (CIALIS ) 20 MG tablet TAKE 1 TABLET BY MOUTH DAILY AS NEEDED FOR E.D.   insulin  aspart (NOVOLOG  FLEXPEN) 100 UNIT/ML FlexPen Inject subcutaneously (just before each meal) 22-22-20 units (Patient not taking: Reported on 12/13/2023)   lisdexamfetamine  (VYVANSE ) 30 MG capsule Take 1 capsule (30 mg total) by mouth daily. (Patient not taking: Reported on 02/29/2024)   [DISCONTINUED] Continuous Glucose Sensor (FREESTYLE LIBRE 3 PLUS SENSOR) MISC Change sensor every 15 days. (Patient not taking: Reported on 02/29/2024)   No facility-administered encounter medications on file as of 02/29/2024.    History: 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   Basal cell   Past Surgical History:  Procedure Laterality Date   APPENDECTOMY     ATRIAL FIBRILLATION ABLATION N/A 03/27/2019   Procedure: ATRIAL FIBRILLATION ABLATION;  Surgeon: Inocencio Soyla Lunger, MD;  Location: [facility];  Service: Cardiovascular;  Laterality: N/A;   ATRIAL FIBRILLATION ABLATION N/A 06/19/2019    Procedure: ATRIAL FIBRILLATION ABLATION;  Surgeon: Inocencio Soyla Lunger, MD;  Location: [facility];  Service: Cardiovascular;  Laterality: N/A;   carotid injury       post tonsillectomy, R carotid injury with trach   HERNIA REPAIR  1973   inguinal- R side   KNEE SURGERY Right    LUMBAR LAMINECTOMY/DECOMPRESSION MICRODISCECTOMY Right 09/12/2012   Procedure: LUMBAR LAMINECTOMY/DECOMPRESSION MICRODISCECTOMY 1 LEVEL;  Surgeon: Victory DELENA Gunnels, MD;  Location: [facility];  Service: Neurosurgery;  Laterality: Right;  Right lumbar three-four microdiscectomy   ORIF SHOULDER FRACTURE Right 06/02/2017   Procedure: OPEN REDUCTION INTERNAL FIXATION (ORIF) RIGHT GLENOID FRACTURE, POSSIBLE LATARJET CORACOID TRANSFER;  Surgeon: Addie Cordella Hamilton, MD;  Location: [facility] OR;  Service: Orthopedics;  Laterality: Right;   TEE WITHOUT CARDIOVERSION N/A 06/19/2019   Procedure: TRANSESOPHAGEAL ECHOCARDIOGRAM (TEE);  Surgeon: Alveta Aleene PARAS, MD;  Location: [facility];  Service: Cardiovascular;  Laterality: N/A;   TONSILLECTOMY     TRACHEOSTOMY CLOSURE  1981   UMBILICAL HERNIA REPAIR N/A 12/08/2020   Procedure: HERNIA REPAIR UMBILICAL;  Surgeon: Kinsinger, Herlene Righter, MD;  Location: [facility];  Service: General;  Laterality: N/A;   Family History  Problem Relation Age of Onset   COPD Mother    Heart attack Father  Diabetes Maternal Aunt    Diabetes Maternal Aunt    Colon cancer Paternal Grandmother    Cancer Paternal Grandmother        colon cancer   Social History   Occupational History   Occupation: Educational Psychologist: [employer]  Tobacco Use   Smoking status: Former    Current packs/day: 0.00    Types: Cigarettes    Start date: 1973    Quit date: 2007    Years since quitting: 19.0    Passive exposure: Past   Smokeless tobacco: Former    Types: Chew    Quit date: 02/15/2007  Vaping Use   Vaping status: Never Used  Substance and Sexual Activity   Alcohol use: Yes     Comment: 3 glasses of wine per week    Drug use: Not Currently    Types: Marijuana    Comment: gummies occasionally   Sexual activity: Yes    Partners: Male   Tobacco Counseling Counseling given: Not Answered  SDOH Screenings   Food Insecurity: No Food Insecurity (02/29/2024)  Housing: Unknown (02/29/2024)  Transportation Needs: No Transportation Needs (02/29/2024)  Utilities: Not At Risk (02/29/2024)  Alcohol Screen: Low Risk (02/29/2024)  Depression (PHQ2-9): Low Risk (02/29/2024)  Financial Resource Strain: Low Risk (02/29/2024)  Physical Activity: Insufficiently Active (02/29/2024)  Social Connections: Moderately Isolated (02/29/2024)  Stress: No Stress Concern Present (02/29/2024)  Tobacco Use: Medium Risk (02/29/2024)  Health Literacy: Adequate Health Literacy (02/29/2024)   See flowsheets for full screening details  Depression Screen PHQ 2 & 9 Depression Scale- Over the past 2 weeks, how often have you been bothered by any of the following problems? Little interest or pleasure in doing things: 0 Feeling down, depressed, or hopeless (PHQ Adolescent also includes...irritable): 0 PHQ-2 Total Score: 0 Trouble falling or staying asleep, or sleeping too much: 0 Feeling tired or having little energy: 0 Poor appetite or overeating (PHQ Adolescent also includes...weight loss): 0 Feeling bad about yourself - or that you are a failure or have let yourself or your family down: 0 Trouble concentrating on things, such as reading the newspaper or watching television (PHQ Adolescent also includes...like school work): 0 Moving or speaking so slowly that other people could have noticed. Or the opposite - being so fidgety or restless that you have been moving around a lot more than usual: 0 Thoughts that you would be better off dead, or of hurting yourself in some way: 0 PHQ-9 Total Score: 0 If you checked off any problems, how difficult have these problems made it for you to do your work, take care  of things at home, or get along with other people?: Not difficult at all  Depression Treatment Depression Interventions/Treatment : EYV7-0 Score <4 Follow-up Not Indicated     Goals Addressed             This Visit's Progress    Exercise 150 min/wk Moderate Activity               Objective:    Today's Vitals   02/29/24 1458  BP: 116/60  Pulse: 62  Temp: 97.6 F (36.4 C)  TempSrc: Oral  SpO2: 94%  Weight: 173 lb 9.6 oz (78.7 kg)  Height: 5' 7 (1.702 m)   Body mass index is 27.19 kg/m.  Hearing/Vision screen Vision Screening - Comments:: Due for eye exam Immunizations and Health Maintenance Health Maintenance  Topic Date Due   OPHTHALMOLOGY EXAM  02/16/2023   HEMOGLOBIN  A1C  04/30/2024   Diabetic kidney evaluation - Urine ACR  05/24/2024   FOOT EXAM  05/24/2024   Diabetic kidney evaluation - eGFR measurement  10/31/2024   Colonoscopy  02/09/2025   Medicare Annual Wellness (AWV)  02/28/2025   DTaP/Tdap/Td (3 - Td or Tdap) 07/30/2029   Pneumococcal Vaccine: 50+ Years  Completed   Influenza Vaccine  Completed   Hepatitis C Screening  Completed   Zoster Vaccines- Shingrix  Completed   Meningococcal B Vaccine  Aged Out   Hepatitis B Vaccines 19-59 Average Risk  Discontinued   COVID-19 Vaccine  Discontinued        Assessment/Plan:  This is a routine wellness examination for Stevie.  Patient Care Team: Joshua Debby CROME, MD as PCP - General (Internal Medicine) Inocencio Soyla Lunger, MD as PCP - Electrophysiology (Cardiology) Michele Richardson, DO as PCP - Cardiology (Cardiology) Merceda Lela SAUNDERS, RPH-CPP as Pharmacist (Pharmacist)  I have personally reviewed and noted the following in the patients chart:   Medical and social history Use of alcohol, tobacco or illicit drugs  Current medications and supplements including opioid prescriptions. Functional ability and status Nutritional status Physical activity Advanced directives List of other  physicians Hospitalizations, surgeries, and ER visits in previous 12 months Vitals Screenings to include cognitive, depression, and falls Referrals and appointments  No orders of the defined types were placed in this encounter.  In addition, I have reviewed and discussed with patient certain preventive protocols, quality metrics, and best practice recommendations. A written personalized care plan for preventive services as well as general preventive health recommendations were provided to patient.   Ardella FORBES Dawn, LPN   8/84/7973   Return in 1 year (on 02/28/2025).  After Visit Summary: (In Person-Declined) Patient declined AVS at this time.  Nurse Notes: HM Addressed: Patient states he will make an eye appointment. He says has has not taken Vyvanse  in the past week. He said he can not tell the difference.  "

## 2024-03-01 NOTE — Addendum Note (Signed)
 Addended by: Kinzy Weyers E on: 03/01/2024 08:13 AM   Modules accepted: Orders

## 2024-03-04 MED ORDER — DEXCOM G7 SENSOR MISC: 1 refills | Status: AC

## 2024-03-04 MED ORDER — DEXCOM G7 RECEIVER DEVI: 0 refills | Status: AC

## 2024-03-04 NOTE — Telephone Encounter (Signed)
 Pharmacy requested clarification on Dexcom Receiver prescription. Quantity and sig were incorrect. Resent Dexcom G7 sensor and receiver prescriptions with updated instructions.  Darrelyn Drum, PharmD, BCPS, CPP Clinical Pharmacist Practitioner  Primary Care at [facility] [phone]

## 2024-03-06 NOTE — Telephone Encounter (Signed)
 This has been handled.

## 2024-03-08 ENCOUNTER — Telehealth: Payer: Self-pay

## 2024-03-08 ENCOUNTER — Other Ambulatory Visit (HOSPITAL_COMMUNITY): Payer: Self-pay

## 2024-03-08 NOTE — Telephone Encounter (Signed)
 Pharmacy Patient Advocate Encounter   Received notification from [facility] KEY that prior authorization for Dexcom 7 is required/requested.   Insurance verification completed.   The patient is insured through [place].   Per test claim: PA required; PA submitted to above mentioned insurance via Latent Key/confirmation #/EOC [id number] Status is pending

## 2024-03-11 ENCOUNTER — Other Ambulatory Visit (HOSPITAL_COMMUNITY): Payer: Self-pay

## 2024-03-11 NOTE — Telephone Encounter (Signed)
 Pharmacy Patient Advocate Encounter  Received notification from [employer] that Prior Authorization for Dexcom has been APPROVED from 02/15/2024 to 02/13/2025. Ran test claim, Copay is $0. This test claim was processed through [facility]- copay amounts may vary at other pharmacies due to pharmacy/plan contracts, or as the patient moves through the different stages of their insurance plan.   PA #/Case ID/Reference #: [id number]

## 2024-04-30 ENCOUNTER — Ambulatory Visit: Admitting: Internal Medicine

## 2025-03-03 ENCOUNTER — Ambulatory Visit
